# Patient Record
Sex: Male | Born: 1945 | Race: Black or African American | Hispanic: No | Marital: Married | Smoking: Current every day smoker
Health system: Southern US, Community
[De-identification: ages and names within clinical notes are randomized; demographics above are authoritative.]

## PROBLEM LIST (undated history)

## (undated) DIAGNOSIS — N186 End stage renal disease: Secondary | ICD-10-CM

## (undated) DIAGNOSIS — N400 Enlarged prostate without lower urinary tract symptoms: Secondary | ICD-10-CM

## (undated) DIAGNOSIS — I2510 Atherosclerotic heart disease of native coronary artery without angina pectoris: Secondary | ICD-10-CM

## (undated) DIAGNOSIS — I350 Nonrheumatic aortic (valve) stenosis: Secondary | ICD-10-CM

## (undated) DIAGNOSIS — F4310 Post-traumatic stress disorder, unspecified: Secondary | ICD-10-CM

## (undated) DIAGNOSIS — G4730 Sleep apnea, unspecified: Secondary | ICD-10-CM

## (undated) DIAGNOSIS — N210 Calculus in bladder: Secondary | ICD-10-CM

## (undated) DIAGNOSIS — E785 Hyperlipidemia, unspecified: Secondary | ICD-10-CM

## (undated) DIAGNOSIS — R7303 Prediabetes: Secondary | ICD-10-CM

## (undated) DIAGNOSIS — G629 Polyneuropathy, unspecified: Secondary | ICD-10-CM

## (undated) DIAGNOSIS — I10 Essential (primary) hypertension: Secondary | ICD-10-CM

## (undated) DIAGNOSIS — N183 Chronic kidney disease, stage 3 unspecified: Secondary | ICD-10-CM

## (undated) DIAGNOSIS — M1990 Unspecified osteoarthritis, unspecified site: Secondary | ICD-10-CM

## (undated) DIAGNOSIS — I739 Peripheral vascular disease, unspecified: Secondary | ICD-10-CM

## (undated) DIAGNOSIS — R011 Cardiac murmur, unspecified: Secondary | ICD-10-CM

## (undated) HISTORY — PX: TRANSTHORACIC ECHOCARDIOGRAM: SHX275

## (undated) HISTORY — PX: LUMBAR DISC SURGERY: SHX700

---

## 1967-05-06 HISTORY — PX: OTHER SURGICAL HISTORY: SHX169

## 2002-07-08 ENCOUNTER — Ambulatory Visit (HOSPITAL_COMMUNITY): Admission: RE | Admit: 2002-07-08 | Discharge: 2002-07-08 | Payer: Self-pay | Admitting: Orthopedic Surgery

## 2002-07-08 ENCOUNTER — Encounter: Payer: Self-pay | Admitting: Orthopedic Surgery

## 2010-02-08 ENCOUNTER — Ambulatory Visit (HOSPITAL_COMMUNITY)
Admission: RE | Admit: 2010-02-08 | Discharge: 2010-02-09 | Payer: Self-pay | Source: Home / Self Care | Admitting: Neurosurgery

## 2010-07-18 LAB — CBC
HCT: 33.1 % — ABNORMAL LOW (ref 39.0–52.0)
Hemoglobin: 11.1 g/dL — ABNORMAL LOW (ref 13.0–17.0)
MCH: 30.6 pg (ref 26.0–34.0)
MCHC: 33.5 g/dL (ref 30.0–36.0)
MCV: 91.2 fL (ref 78.0–100.0)
Platelets: 235 10*3/uL (ref 150–400)
RBC: 3.63 MIL/uL — ABNORMAL LOW (ref 4.22–5.81)
RDW: 12.1 % (ref 11.5–15.5)
WBC: 5.8 10*3/uL (ref 4.0–10.5)

## 2010-07-18 LAB — BASIC METABOLIC PANEL
BUN: 40 mg/dL — ABNORMAL HIGH (ref 6–23)
CO2: 25 mEq/L (ref 19–32)
Calcium: 9.7 mg/dL (ref 8.4–10.5)
Chloride: 105 mEq/L (ref 96–112)
Creatinine, Ser: 2.21 mg/dL — ABNORMAL HIGH (ref 0.4–1.5)
GFR calc Af Amer: 36 mL/min — ABNORMAL LOW (ref >60)
GFR calc non Af Amer: 30 mL/min — ABNORMAL LOW (ref >60)
Glucose, Bld: 122 mg/dL — ABNORMAL HIGH (ref 70–99)
Potassium: 4.3 mEq/L (ref 3.5–5.1)
Sodium: 139 mEq/L (ref 135–145)

## 2010-07-18 LAB — SURGICAL PCR SCREEN
MRSA, PCR: NEGATIVE
Staphylococcus aureus: NEGATIVE

## 2011-08-24 IMAGING — CR DG LUMBAR SPINE 2-3V
1 series · 1 of 1 positions shown · non-contrast
Comparison: None.

CLINICAL DATA: L4-5 decompression.

LUMBAR SPINE - 2-3 VIEW

[view not recorded]
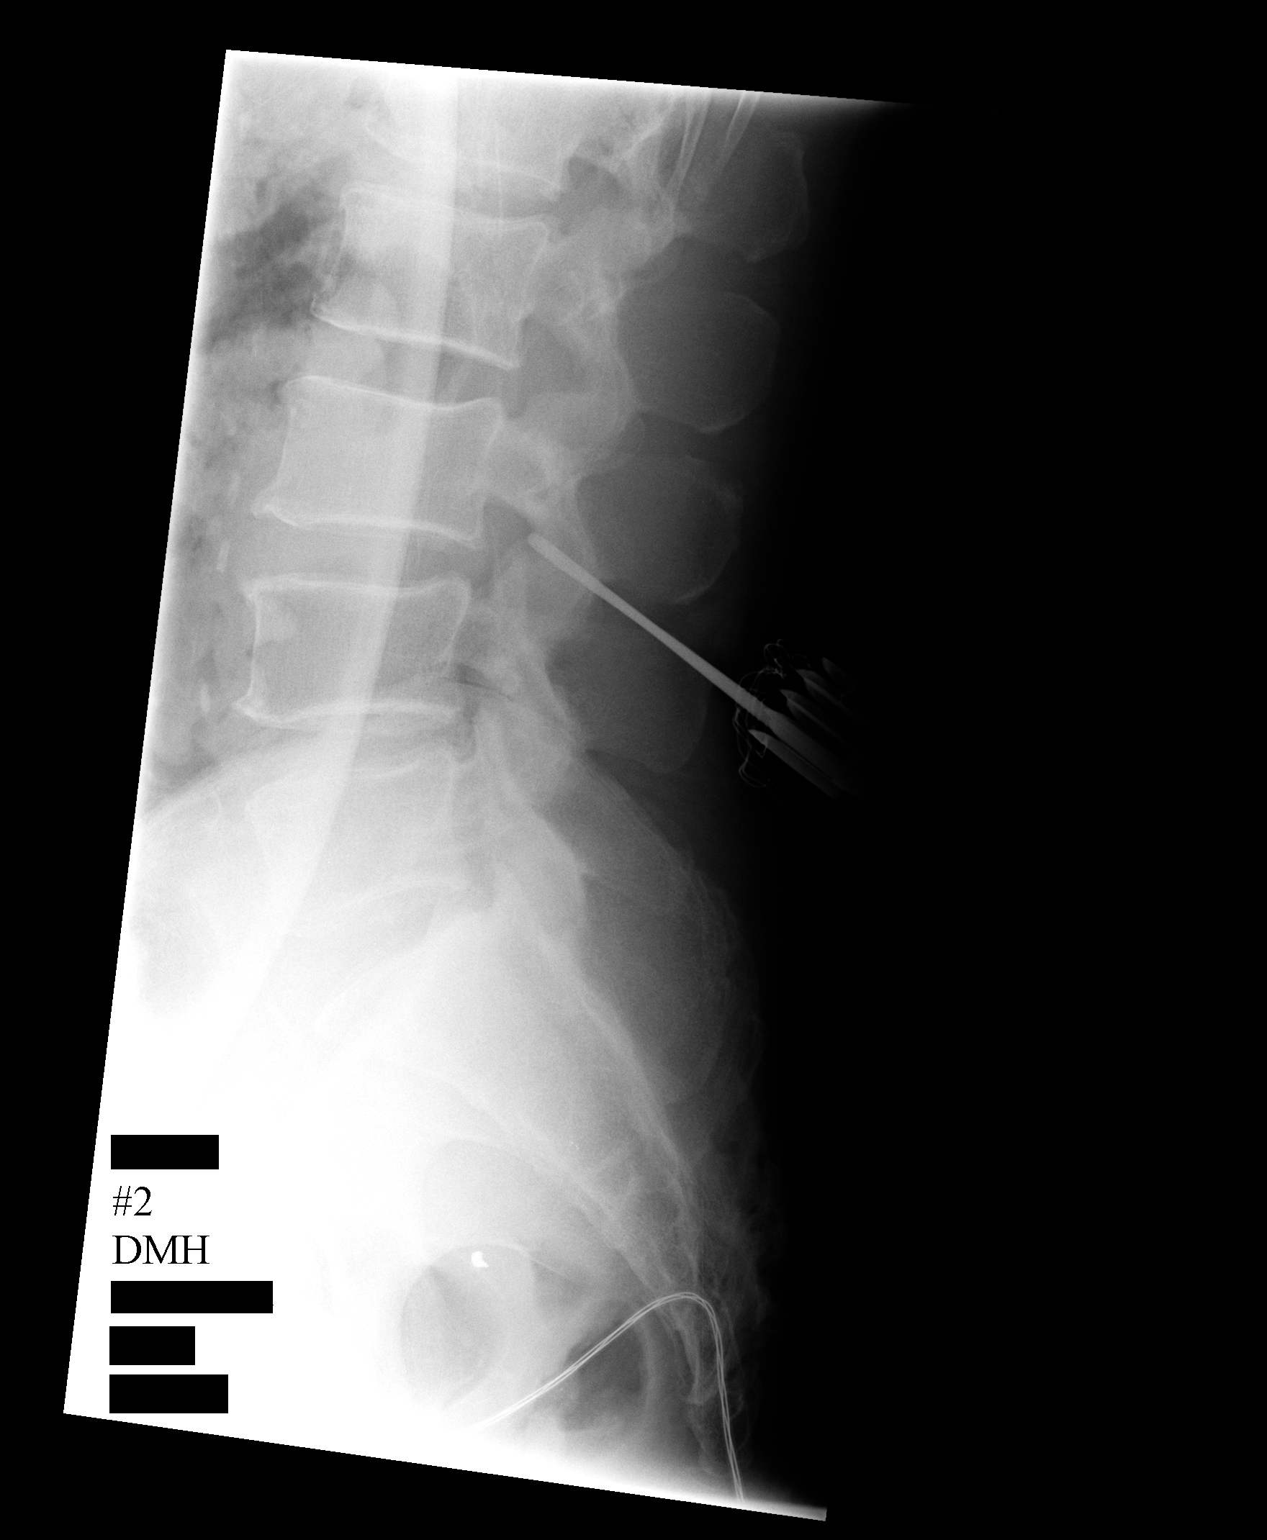

[1 of 1 positions shown; findings below may reference images not displayed]

FINDINGS: There are two intraoperative cross-table lateral views of
the lumbar spine.  The first film taken at 8228 hours shows a
surgical instrument tip projecting over the L4 spinous process.
There are endplate degenerative changes.

The second film taken at 4181 hours shows a surgical instrument tip
projecting along the posterior margin of the L3-4 neural foramina.
IMPRESSION: Intraoperative localization.

## 2015-05-02 NOTE — Progress Notes (Unsigned)
B/P: 164/83, Pulse: 89 per Mayra Reel RN

## 2015-08-19 DIAGNOSIS — N189 Chronic kidney disease, unspecified: Secondary | ICD-10-CM | POA: Diagnosis not present

## 2015-08-19 DIAGNOSIS — I129 Hypertensive chronic kidney disease with stage 1 through stage 4 chronic kidney disease, or unspecified chronic kidney disease: Secondary | ICD-10-CM | POA: Diagnosis not present

## 2015-08-19 DIAGNOSIS — Z79899 Other long term (current) drug therapy: Secondary | ICD-10-CM | POA: Diagnosis not present

## 2015-08-19 DIAGNOSIS — J449 Chronic obstructive pulmonary disease, unspecified: Secondary | ICD-10-CM | POA: Diagnosis not present

## 2015-08-19 DIAGNOSIS — Z7982 Long term (current) use of aspirin: Secondary | ICD-10-CM | POA: Diagnosis not present

## 2015-08-19 DIAGNOSIS — R2242 Localized swelling, mass and lump, left lower limb: Secondary | ICD-10-CM | POA: Diagnosis not present

## 2015-08-19 DIAGNOSIS — Z801 Family history of malignant neoplasm of trachea, bronchus and lung: Secondary | ICD-10-CM | POA: Diagnosis not present

## 2015-08-19 DIAGNOSIS — E7800 Pure hypercholesterolemia, unspecified: Secondary | ICD-10-CM | POA: Diagnosis not present

## 2015-08-21 DIAGNOSIS — G629 Polyneuropathy, unspecified: Secondary | ICD-10-CM | POA: Diagnosis not present

## 2015-08-21 DIAGNOSIS — J449 Chronic obstructive pulmonary disease, unspecified: Secondary | ICD-10-CM | POA: Diagnosis not present

## 2015-08-21 DIAGNOSIS — I129 Hypertensive chronic kidney disease with stage 1 through stage 4 chronic kidney disease, or unspecified chronic kidney disease: Secondary | ICD-10-CM | POA: Diagnosis not present

## 2015-08-21 DIAGNOSIS — E7800 Pure hypercholesterolemia, unspecified: Secondary | ICD-10-CM | POA: Diagnosis not present

## 2015-08-21 DIAGNOSIS — N189 Chronic kidney disease, unspecified: Secondary | ICD-10-CM | POA: Diagnosis not present

## 2015-08-21 DIAGNOSIS — Z79899 Other long term (current) drug therapy: Secondary | ICD-10-CM | POA: Diagnosis not present

## 2015-08-21 DIAGNOSIS — M109 Gout, unspecified: Secondary | ICD-10-CM | POA: Diagnosis not present

## 2015-08-21 DIAGNOSIS — L97529 Non-pressure chronic ulcer of other part of left foot with unspecified severity: Secondary | ICD-10-CM | POA: Diagnosis not present

## 2015-08-21 DIAGNOSIS — Z7982 Long term (current) use of aspirin: Secondary | ICD-10-CM | POA: Diagnosis not present

## 2015-09-06 DIAGNOSIS — E7800 Pure hypercholesterolemia, unspecified: Secondary | ICD-10-CM | POA: Diagnosis not present

## 2015-09-06 DIAGNOSIS — M109 Gout, unspecified: Secondary | ICD-10-CM | POA: Diagnosis not present

## 2015-09-06 DIAGNOSIS — Z79899 Other long term (current) drug therapy: Secondary | ICD-10-CM | POA: Diagnosis not present

## 2015-09-06 DIAGNOSIS — M19072 Primary osteoarthritis, left ankle and foot: Secondary | ICD-10-CM | POA: Diagnosis not present

## 2015-09-06 DIAGNOSIS — Z7982 Long term (current) use of aspirin: Secondary | ICD-10-CM | POA: Diagnosis not present

## 2015-09-06 DIAGNOSIS — I129 Hypertensive chronic kidney disease with stage 1 through stage 4 chronic kidney disease, or unspecified chronic kidney disease: Secondary | ICD-10-CM | POA: Diagnosis not present

## 2015-09-06 DIAGNOSIS — N189 Chronic kidney disease, unspecified: Secondary | ICD-10-CM | POA: Diagnosis not present

## 2015-09-06 DIAGNOSIS — J449 Chronic obstructive pulmonary disease, unspecified: Secondary | ICD-10-CM | POA: Diagnosis not present

## 2015-09-06 DIAGNOSIS — Z87820 Personal history of traumatic brain injury: Secondary | ICD-10-CM | POA: Diagnosis not present

## 2015-09-06 DIAGNOSIS — G629 Polyneuropathy, unspecified: Secondary | ICD-10-CM | POA: Diagnosis not present

## 2015-10-07 DIAGNOSIS — M5432 Sciatica, left side: Secondary | ICD-10-CM | POA: Diagnosis not present

## 2015-10-23 DIAGNOSIS — G8929 Other chronic pain: Secondary | ICD-10-CM | POA: Diagnosis not present

## 2015-10-23 DIAGNOSIS — I519 Heart disease, unspecified: Secondary | ICD-10-CM | POA: Diagnosis not present

## 2015-10-23 DIAGNOSIS — Z7982 Long term (current) use of aspirin: Secondary | ICD-10-CM | POA: Diagnosis not present

## 2015-10-23 DIAGNOSIS — I129 Hypertensive chronic kidney disease with stage 1 through stage 4 chronic kidney disease, or unspecified chronic kidney disease: Secondary | ICD-10-CM | POA: Diagnosis not present

## 2015-10-23 DIAGNOSIS — J449 Chronic obstructive pulmonary disease, unspecified: Secondary | ICD-10-CM | POA: Diagnosis not present

## 2015-10-23 DIAGNOSIS — Z87820 Personal history of traumatic brain injury: Secondary | ICD-10-CM | POA: Diagnosis not present

## 2015-10-23 DIAGNOSIS — M79605 Pain in left leg: Secondary | ICD-10-CM | POA: Diagnosis not present

## 2015-10-23 DIAGNOSIS — Z79899 Other long term (current) drug therapy: Secondary | ICD-10-CM | POA: Diagnosis not present

## 2015-10-23 DIAGNOSIS — G629 Polyneuropathy, unspecified: Secondary | ICD-10-CM | POA: Diagnosis not present

## 2015-10-23 DIAGNOSIS — E7800 Pure hypercholesterolemia, unspecified: Secondary | ICD-10-CM | POA: Diagnosis not present

## 2015-10-23 DIAGNOSIS — N189 Chronic kidney disease, unspecified: Secondary | ICD-10-CM | POA: Diagnosis not present

## 2015-11-15 ENCOUNTER — Telehealth (HOSPITAL_COMMUNITY): Payer: Self-pay | Admitting: Vascular Surgery

## 2015-11-15 NOTE — Telephone Encounter (Signed)
11/15/2015 350pm received a request for a consult from the [facility] for left great toe ulcer w/ assoc. Osteomyelitis.  The next available appointment with any physician in the practice is August 3rd.  This was offered to Matthew [name].  She declined.  I offered to put Matthew [name] on a wait list.   She declined.   I explained that she would need to call the [place] to see if they wanted to refer him to a different practice or they could go to the [facility] ER if they felt like it was an emergency.   She declined calling the [place] stating that it would take to long.    She said they would go to the ER on 11/16/15.

## 2015-11-16 ENCOUNTER — Encounter (HOSPITAL_COMMUNITY): Payer: Self-pay | Admitting: Emergency Medicine

## 2015-11-16 ENCOUNTER — Emergency Department (HOSPITAL_COMMUNITY): Payer: Medicare Other

## 2015-11-16 ENCOUNTER — Inpatient Hospital Stay (HOSPITAL_COMMUNITY)
Admission: EM | Admit: 2015-11-16 | Discharge: 2015-11-17 | DRG: 638 | Disposition: A | Discharge status: Home / Self Care | Payer: Medicare Other | Attending: Family Medicine | Admitting: Family Medicine

## 2015-11-16 DIAGNOSIS — E119 Type 2 diabetes mellitus without complications: Secondary | ICD-10-CM

## 2015-11-16 DIAGNOSIS — E1140 Type 2 diabetes mellitus with diabetic neuropathy, unspecified: Secondary | ICD-10-CM | POA: Diagnosis present

## 2015-11-16 DIAGNOSIS — Z833 Family history of diabetes mellitus: Secondary | ICD-10-CM

## 2015-11-16 DIAGNOSIS — N183 Chronic kidney disease, stage 3 unspecified: Secondary | ICD-10-CM

## 2015-11-16 DIAGNOSIS — L97529 Non-pressure chronic ulcer of other part of left foot with unspecified severity: Secondary | ICD-10-CM | POA: Diagnosis not present

## 2015-11-16 DIAGNOSIS — I129 Hypertensive chronic kidney disease with stage 1 through stage 4 chronic kidney disease, or unspecified chronic kidney disease: Secondary | ICD-10-CM | POA: Diagnosis not present

## 2015-11-16 DIAGNOSIS — E1122 Type 2 diabetes mellitus with diabetic chronic kidney disease: Secondary | ICD-10-CM | POA: Diagnosis present

## 2015-11-16 DIAGNOSIS — M869 Osteomyelitis, unspecified: Secondary | ICD-10-CM | POA: Diagnosis not present

## 2015-11-16 DIAGNOSIS — F17210 Nicotine dependence, cigarettes, uncomplicated: Secondary | ICD-10-CM | POA: Diagnosis present

## 2015-11-16 DIAGNOSIS — E1169 Type 2 diabetes mellitus with other specified complication: Secondary | ICD-10-CM | POA: Diagnosis not present

## 2015-11-16 DIAGNOSIS — M868X7 Other osteomyelitis, ankle and foot: Secondary | ICD-10-CM | POA: Diagnosis not present

## 2015-11-16 DIAGNOSIS — I10 Essential (primary) hypertension: Secondary | ICD-10-CM

## 2015-11-16 DIAGNOSIS — M86172 Other acute osteomyelitis, left ankle and foot: Secondary | ICD-10-CM | POA: Diagnosis present

## 2015-11-16 DIAGNOSIS — L089 Local infection of the skin and subcutaneous tissue, unspecified: Secondary | ICD-10-CM | POA: Diagnosis present

## 2015-11-16 DIAGNOSIS — E785 Hyperlipidemia, unspecified: Secondary | ICD-10-CM | POA: Diagnosis not present

## 2015-11-16 DIAGNOSIS — M86672 Other chronic osteomyelitis, left ankle and foot: Secondary | ICD-10-CM | POA: Diagnosis not present

## 2015-11-16 DIAGNOSIS — M79675 Pain in left toe(s): Secondary | ICD-10-CM | POA: Diagnosis not present

## 2015-11-16 HISTORY — DX: Hyperlipidemia, unspecified: E78.5

## 2015-11-16 HISTORY — DX: Essential (primary) hypertension: I10

## 2015-11-16 LAB — CBC WITH DIFFERENTIAL/PLATELET
Basophils Absolute: 0.1 10*3/uL (ref 0.0–0.1)
Basophils Relative: 1 %
EOS PCT: 4 %
Eosinophils Absolute: 0.3 10*3/uL (ref 0.0–0.7)
HEMATOCRIT: 32.3 % — AB (ref 39.0–52.0)
Hemoglobin: 11 g/dL — ABNORMAL LOW (ref 13.0–17.0)
LYMPHS ABS: 2.1 10*3/uL (ref 0.7–4.0)
Lymphocytes Relative: 27 %
MCH: 29.7 pg (ref 26.0–34.0)
MCHC: 34.1 g/dL (ref 30.0–36.0)
MCV: 87.3 fL (ref 78.0–100.0)
MONO ABS: 0.7 10*3/uL (ref 0.1–1.0)
MONOS PCT: 9 %
NEUTROS PCT: 59 %
Neutro Abs: 4.7 10*3/uL (ref 1.7–7.7)
PLATELETS: 285 10*3/uL (ref 150–400)
RBC: 3.7 MIL/uL — ABNORMAL LOW (ref 4.22–5.81)
RDW: 13.6 % (ref 11.5–15.5)
WBC: 7.9 10*3/uL (ref 4.0–10.5)

## 2015-11-16 LAB — SEDIMENTATION RATE: Sed Rate: 60 mm/hr — ABNORMAL HIGH (ref 0–16)

## 2015-11-16 LAB — BASIC METABOLIC PANEL
Anion gap: 9 (ref 5–15)
BUN: 12 mg/dL (ref 6–20)
CHLORIDE: 109 mmol/L (ref 101–111)
CO2: 22 mmol/L (ref 22–32)
Calcium: 9.2 mg/dL (ref 8.9–10.3)
Creatinine, Ser: 1.74 mg/dL — ABNORMAL HIGH (ref 0.61–1.24)
GFR calc Af Amer: 44 mL/min — ABNORMAL LOW (ref >60)
GFR, EST NON AFRICAN AMERICAN: 38 mL/min — AB (ref >60)
GLUCOSE: 65 mg/dL (ref 65–99)
POTASSIUM: 3.7 mmol/L (ref 3.5–5.1)
Sodium: 140 mmol/L (ref 135–145)

## 2015-11-16 LAB — CBG MONITORING, ED: Glucose-Capillary: 156 mg/dL — ABNORMAL HIGH (ref 65–99)

## 2015-11-16 LAB — C-REACTIVE PROTEIN: CRP: <0.5 mg/dL (ref <1.0)

## 2015-11-16 LAB — GLUCOSE, CAPILLARY: GLUCOSE-CAPILLARY: 143 mg/dL — AB (ref 65–99)

## 2015-11-16 MED ORDER — ONDANSETRON HCL 4 MG PO TABS: 4.0000 mg | ORAL_TABLET | Freq: Four times a day (QID) | ORAL | Status: DC | PRN

## 2015-11-16 MED ORDER — TRAZODONE HCL 50 MG PO TABS
25.0000 mg | ORAL_TABLET | Freq: Every evening | ORAL | Status: DC | PRN
  Administered 2015-11-16: 25 mg via ORAL
  Filled 2015-11-16: qty 1

## 2015-11-16 MED ORDER — HYDROCODONE-ACETAMINOPHEN 5-325 MG PO TABS
1.0000 | ORAL_TABLET | ORAL | Status: DC | PRN
  Administered 2015-11-16 – 2015-11-17 (×5): 2 via ORAL
  Filled 2015-11-16 (×4): qty 2

## 2015-11-16 MED ORDER — ONDANSETRON HCL 4 MG/2ML IJ SOLN: 4.0000 mg | Freq: Four times a day (QID) | INTRAMUSCULAR | Status: DC | PRN

## 2015-11-16 MED ORDER — HYDROMORPHONE HCL 1 MG/ML IJ SOLN: 1.0000 mg | INTRAMUSCULAR | Status: DC | PRN

## 2015-11-16 MED ORDER — HEPARIN SODIUM (PORCINE) 5000 UNIT/ML IJ SOLN
5000.0000 [IU] | Freq: Three times a day (TID) | INTRAMUSCULAR | Status: DC
  Administered 2015-11-16 – 2015-11-17 (×2): 5000 [IU] via SUBCUTANEOUS
  Filled 2015-11-16: qty 1

## 2015-11-16 MED ORDER — VANCOMYCIN HCL IN DEXTROSE 1-5 GM/200ML-% IV SOLN
1000.0000 mg | Freq: Once | INTRAVENOUS | Status: AC
  Administered 2015-11-16: 1000 mg via INTRAVENOUS
  Filled 2015-11-16: qty 200

## 2015-11-16 MED ORDER — VANCOMYCIN HCL IN DEXTROSE 750-5 MG/150ML-% IV SOLN
750.0000 mg | Freq: Two times a day (BID) | INTRAVENOUS | Status: DC
  Administered 2015-11-17: 750 mg via INTRAVENOUS
  Filled 2015-11-16 (×3): qty 150

## 2015-11-16 MED ORDER — ATORVASTATIN CALCIUM 20 MG PO TABS
20.0000 mg | ORAL_TABLET | Freq: Every day | ORAL | Status: DC
  Administered 2015-11-16: 20 mg via ORAL
  Filled 2015-11-16: qty 1

## 2015-11-16 MED ORDER — METHOCARBAMOL 750 MG PO TABS
750.0000 mg | ORAL_TABLET | Freq: Every day | ORAL | Status: DC
  Administered 2015-11-16: 750 mg via ORAL
  Filled 2015-11-16: qty 1

## 2015-11-16 MED ORDER — POLYETHYLENE GLYCOL 3350 17 G PO PACK: 17.0000 g | PACK | Freq: Every day | ORAL | Status: DC | PRN

## 2015-11-16 MED ORDER — ACETAMINOPHEN 325 MG PO TABS: 650.0000 mg | ORAL_TABLET | Freq: Four times a day (QID) | ORAL | Status: DC | PRN

## 2015-11-16 MED ORDER — FERROUS SULFATE 325 (65 FE) MG PO TABS
325.0000 mg | ORAL_TABLET | Freq: Two times a day (BID) | ORAL | Status: DC
  Administered 2015-11-17: 325 mg via ORAL
  Filled 2015-11-16: qty 1

## 2015-11-16 MED ORDER — GABAPENTIN 600 MG PO TABS
600.0000 mg | ORAL_TABLET | Freq: Two times a day (BID) | ORAL | Status: DC
  Administered 2015-11-16 – 2015-11-17 (×2): 600 mg via ORAL
  Filled 2015-11-16 (×3): qty 1

## 2015-11-16 MED ORDER — PIPERACILLIN-TAZOBACTAM 3.375 G IVPB
3.3750 g | Freq: Three times a day (TID) | INTRAVENOUS | Status: DC
  Administered 2015-11-17: 3.375 g via INTRAVENOUS
  Filled 2015-11-16 (×4): qty 50

## 2015-11-16 MED ORDER — SODIUM CHLORIDE 0.9 % IV SOLN: INTRAVENOUS | Status: DC

## 2015-11-16 MED ORDER — AMLODIPINE BESYLATE 10 MG PO TABS
10.0000 mg | ORAL_TABLET | Freq: Every day | ORAL | Status: DC
  Administered 2015-11-17: 10 mg via ORAL
  Filled 2015-11-16: qty 1

## 2015-11-16 MED ORDER — PIPERACILLIN-TAZOBACTAM 3.375 G IVPB 30 MIN
3.3750 g | Freq: Once | INTRAVENOUS | Status: AC
  Administered 2015-11-16: 3.375 g via INTRAVENOUS
  Filled 2015-11-16: qty 50

## 2015-11-16 MED ORDER — INSULIN ASPART 100 UNIT/ML ~~LOC~~ SOLN
0.0000 [IU] | Freq: Three times a day (TID) | SUBCUTANEOUS | Status: DC
  Filled 2015-11-16: qty 1

## 2015-11-16 MED ORDER — ACETAMINOPHEN 650 MG RE SUPP: 650.0000 mg | Freq: Four times a day (QID) | RECTAL | Status: DC | PRN

## 2015-11-16 MED ORDER — HYDRALAZINE HCL 20 MG/ML IJ SOLN: 10.0000 mg | Freq: Four times a day (QID) | INTRAMUSCULAR | Status: DC | PRN

## 2015-11-16 MED ORDER — BISACODYL 5 MG PO TBEC: 5.0000 mg | DELAYED_RELEASE_TABLET | Freq: Every day | ORAL | Status: DC | PRN

## 2015-11-16 MED ORDER — LATANOPROST 0.005 % OP SOLN
1.0000 [drp] | Freq: Every day | OPHTHALMIC | Status: DC
  Administered 2015-11-16: 1 [drp] via OPHTHALMIC
  Filled 2015-11-16 (×2): qty 2.5

## 2015-11-16 NOTE — ED Provider Notes (Signed)
CSN: [id number]     Arrival date & time 11/16/15  0957 History   First MD Initiated Contact with Patient 11/16/15 1004     Chief Complaint  Patient presents with  . Circulatory Problem     (Consider location/radiation/quality/duration/timing/severity/associated sxs/prior Treatment) The history is provided by the patient.    Matthew Matthew [name] is a 70 yo Matthew [name] w/ hx of DM, neuropathy who was recently [facility]) sent here for evaluation by surgery for toe amputation and poor lower extremity circulation.  The patient states that Matthew Matthew [name] has been evaluated by the [facility] for this complaint, and was referred to vascular for possible amputation.  Matthew Matthew [name] was unable to secure an appointment before August 3rd, but it was determined that this was not soon enough and Matthew Matthew [name] was advised to come here for further vascular care.  Matthew Matthew [name] endorses pain in the L toe that is consistent with the pain Matthew Matthew [name] has been experiencing, and denies fevers, chills, CP, or pain in any other part of his LE. The current toe became red and swollen several weeks ago, however it has gotten worse over the past couple of weeks.    ROS 10 Systems reviewed and are negative for acute change except as noted in the HPI.     Past Medical History  Diagnosis Date  . Diabetes mellitus without complication ([place])   . Hypertension   . High cholesterol    Past Surgical History  Procedure Laterality Date  . Back surgery     No family history on file. Social History  Substance Use Topics  . Smoking status: Current Every Day Smoker -- 1.00 packs/day    Types: Cigarettes  . Smokeless tobacco: Not on file  . Alcohol Use: No    Review of Systems  Constitutional: Negative for activity change and appetite change.  Respiratory: Negative for cough and shortness of breath.   Cardiovascular: Negative for chest pain.  Gastrointestinal: Negative for abdominal pain.  Genitourinary: Negative for  dysuria.  Musculoskeletal: Positive for arthralgias.  Skin: Positive for rash and wound.  Allergic/Immunologic: Positive for immunocompromised state.      Allergies  Review of patient's allergies indicates no known allergies.  Home Medications   Prior to Admission medications   Not on File   BP 147/98 mmHg  Pulse 80  Temp(Src) 97.9 F (36.6 C) (Oral)  Resp 13  SpO2 97% Physical Exam  Constitutional: Matthew Matthew [name] is oriented to person, place, and time. Matthew Matthew [name] appears well-developed.  HENT:  Head: Atraumatic.  Neck: Neck supple.  Cardiovascular: Normal rate.   Pulmonary/Chest: Effort normal.  Musculoskeletal:  L great toe is edematous, erythematous. There is dopplerable pulse Skin is warm  Neurological: Matthew Matthew [name] is alert and oriented to person, place, and time.  Skin: Skin is warm. Rash noted.  Nursing note and vitals reviewed.   ED Course  Procedures (including critical care time) Labs Review Labs Reviewed  CBC WITH DIFFERENTIAL/PLATELET - Abnormal; Notable for the following:    RBC 3.70 (*)    Hemoglobin 11.0 (*)    HCT 32.3 (*)    All other components within normal limits  BASIC METABOLIC PANEL - Abnormal; Notable for the following:    Creatinine, Ser 1.74 (*)    GFR calc non Af Amer 38 (*)    GFR calc Af Amer 44 (*)    All other components within normal limits  SEDIMENTATION RATE - Abnormal; Notable for the following:  Sed Rate 60 (*)    All other components within normal limits  C-REACTIVE PROTEIN  HEMOGLOBIN A1C    Imaging Review Dg Toe Great Left  11/16/2015  CLINICAL DATA:  Left great toe pain for 6 months. EXAM: LEFT GREAT TOE COMPARISON:  Radiographs of Sep 06, 2015. FINDINGS: There is interval development of lytic destruction involving the distal portion of the first proximal phalanx and proximal base of the first distal phalanx. This is consistent with acute osteomyelitis. Vascular calcifications are noted. No radiopaque foreign body is noted. IMPRESSION: Findings  consistent with acute osteomyelitis involving the first proximal and distal phalanges. Electronically Signed   By: Marijo Conception, Matthew [name].D.   On: 11/16/2015 12:19   I have personally reviewed and evaluated these images and lab results as part of my medical decision-making.   EKG Interpretation None      MDM   Final diagnoses:  Acute osteomyelitis of toe, left (HCC)   PT comes in with cc of toe swelling. Xrays confirm osteomyelitis. Pt has no systemic disease at this time and is clinically stable.  Orthopedics consulted - and Dr. Percell Miller will evaluate the patient. Medicine to admit.  Pt might have some claudication type symptoms - no critical limb  Ischemia currently. Defer care to admitting team on that regards.    Varney Biles, MD 11/16/15 [phone]

## 2015-11-16 NOTE — Progress Notes (Signed)
Pharmacy Antibiotic Note Matthew [name] is a 70 y.o. male admitted on 11/16/2015 with osteomyelitis of L great toe.  Pharmacy has been consulted for Zosyn and vancomycin dosing.  Plan: Vancomycin 750 IV every 12 hours.  Goal trough 15-20 mcg/mL. Zosyn 3.375g IV q8h (4 hour infusion).  Weight: 182 lb (82.555 kg)  Temp (24hrs), Avg:97.9 F (36.6 C), Min:97.7 F (36.5 C), Max:98.2 F (36.8 C)   Recent Labs Lab 11/16/15 1135  WBC 7.9  CREATININE 1.74*    CrCl cannot be calculated (Unknown ideal weight.).    No Known Allergies  Antimicrobials this admission: 7/14 Zosyn >>  7 /14 vancomycin  >>   Dose adjustments this admission: n/a  Microbiology results: None to date  Thank you for allowing pharmacy to be a part of this patient's care.  Vincenza Hews, PharmD, BCPS 11/16/2015, 7:56 PM Pager: [phone]

## 2015-11-16 NOTE — H&P (Signed)
History and Physical    Matthew [name] [id number] DOB: 1945/07/10 DOA: 11/16/2015  PCP: No primary care provider on file.Followed by the [facility]  Patient coming from:   Home    Chief Complaint: Left great toe infection  HPI: Matthew [name] is a 70 y.o. male with medical history significant for but not limited to, HTN, tobacco abuse, and DM2. Six months ago patient developed pain and redness in his left great toe. This eventually ulcerated. Patient has been under care of [facility]. Last month he was started on twice daily amoxicillin. Wife and daughter state his toe actually looks much better. Patient was having significant pain in his toe requiring frequent emergency department visits but even his pain has improved. An xray at [facility] showed bone destruction. PCP tried to get the patient to a vascular surgeon but no available appointments until late August so he was advised to come to ED.   ED Course:  Afebrile, normotensive. Normal oxygen saturation Creatinine 1.74, WBC 7.9, hemoglobin 11, sedimentation rate 68 Left great toe x-ray consistent with acute osteomyelitis  Review of Systems:  Chronic constipation . As per HPI, otherwise 10 point review of systems negative.    Past Medical History  Diagnosis Date  . Diabetes mellitus without complication ([place])   . Hypertension   . High cholesterol     Past Surgical History  Procedure Laterality Date  . Back surgery      Social History   Social History  . Marital Status: Married    Spouse Name: N/A  . Number of Children: N/A  . Years of Education: N/A   Occupational History  . Not on file.   Social History Main Topics  . Smoking status: Current Every Day Smoker -- 1.00 packs/day    Types: Cigarettes  . Smokeless tobacco: Not on file  . Alcohol Use: No  . Drug Use: No  . Sexual Activity: Not on file   Other Topics Concern  . Not on file   Social History Narrative  . No narrative on file  Lives with wife at home.  No assistive devices needed for ambulation  No Known Allergies  Family History  Problem Relation Age of Onset  . Leukemia Sister   . Lung cancer Brother   . Heart failure Mother   . Diabetes Father     Prior to Admission medications   Not on File    Physical Exam: Filed Vitals:   11/16/15 1002 11/16/15 1017 11/16/15 1100  BP: 145/102 113/88 120/84  Pulse: 102 88 83  Temp: 97.7 F (36.5 C) 97.9 F (36.6 C)   TempSrc: Oral Oral   Resp: 18 21 20   SpO2: 98% 97% 97%    Constitutional:  Pleasant well developed black male in NAD, calm, comfortable Filed Vitals:   11/16/15 1002 11/16/15 1017 11/16/15 1100  BP: 145/102 113/88 120/84  Pulse: 102 88 83  Temp: 97.7 F (36.5 C) 97.9 F (36.6 C)   TempSrc: Oral Oral   Resp: 18 21 20   SpO2: 98% 97% 97%   Eyes: PER, lids and conjunctivae normal ENMT: Mucous membranes are moist. Posterior pharynx clear of any exudate or lesions..  Neck: normal, supple, no masses Respiratory: clear to auscultation bilaterally, no wheezing, no crackles. Normal respiratory effort. No accessory muscle use.  Cardiovascular: Regular rate and rhythm, loud murmur, no murmurs. No extremity edema. 1+ pedal pulses.   Abdomen: no tenderness, no masses palpated. No hepatomegaly. Bowel sounds positive.  Musculoskeletal: no clubbing / cyanosis. No joint deformity upper and lower extremities. Good ROM, no contractures. Normal muscle tone.  Skin: no rashes, warm Left foot.  Mild erythema, swelling left great toe. Small healing ulceration on side of left great toe, lateral to nail. No drainage  Neurologic: CN 2-12 grossly intact. Sensation intact, Strength 5/5 in all 4.  Psychiatric: Normal judgment and insight. Alert and oriented x 3. Normal mood.    Labs on Admission: I have personally reviewed following labs and imaging studies  Radiological Exams on Admission: Dg Toe Great Left  11/16/2015  CLINICAL DATA:  Left great toe pain for 6 months. EXAM: LEFT GREAT  TOE COMPARISON:  Radiographs of Sep 06, 2015. FINDINGS: There is interval development of lytic destruction involving the distal portion of the first proximal phalanx and proximal base of the first distal phalanx. This is consistent with acute osteomyelitis. Vascular calcifications are noted. No radiopaque foreign body is noted. IMPRESSION: Findings consistent with acute osteomyelitis involving the first proximal and distal phalanges. Electronically Signed   By: Marijo Conception, M.D.   On: 11/16/2015 12:19    Assessment/Plan   Active Problems:   Osteomyelitis (HCC)   HTN (hypertension)   Diabetes mellitus, type 2 (HCC)   CKD (chronic kidney disease), stage III   Hyperlipidemia   Osteomyelitis left great toe. On BID amoxicillin at home for the last month -admit to Medical bed.    -EDP has consulted Orthopedics -Will hold off on antibiotics until seen by Ortho  DM2, level of control unknown. -A1c -CBGs, SSI  CKD, stage 3 based on today's labs. [facility] . -Minimize use of nephrotoxic medications -AM bmet  HTN. Elevated in ED at 147/98.  . -Continue home meds -prn hydralazine                                   DVT prophylaxis:     SQ Heparin Code Status:   Full code  Family Communication:    Treatment plan discussed with wife and daughter in the room, they both understand and agree with plans  Disposition Plan:  Discharge home 3-4 days             Consults called:    Orthopedics  Admission status:  Admit to Medical bed  Tye Savoy NP [facility] Pager [phone]  If 7PM-7AM, please contact night-coverage [email or web address] Password [facility]  11/16/2015, 1:27 PM    Attestation: Patient was seen, examined,treatment plan was discussed with the Advance Practice Provider.  I have personally reviewed the clinical findings, labs, EKG, imaging studies and management of this patient in detail. I have also reviewed the orders written for this patient which were  under my direction. I agree with the documentation, as recorded by the Advance Practice Provider.   Matthew [name] is a 70 y.o. male with a Past Medical History of DM, HTN who presents with osteomyelitis of great toe.  Toe is quite wiggly and appears to not be fully articulated due to chronic bony infection.  Skin wound actually appears fairly clean.  See by orthopedics and they are fine with outpatient f/u with patient.  Suggest PICC line placement for long-term antibiotics tomorrow and likely discharge in next 24-48 hours for completion of 6 weeks of antibiotics.    Karmen Bongo, MD

## 2015-11-16 NOTE — Consult Note (Signed)
ORTHOPAEDIC CONSULTATION  REQUESTING PHYSICIAN: Reyne Dumas, MD  Chief Complaint: left great toe pain and redness.  Assessment: Active Problems:   Osteomyelitis (HCC)   HTN (hypertension)   Diabetes mellitus, type 2 (HCC)   CKD (chronic kidney disease), stage III   Hyperlipidemia  AFVSN. Left great toe stable/improving with by mouth antibiotics over the past month.  Plan: Continue antibiotic therapy and discharge when stable. Follow-up with Dr. Sharol Given to in his clinic next week.   The patient was seen and examined by Dr. Alain Marion who also discussed the plan for treatment and follow-up. He verbalizes understanding and agrees with this plan.  Weight Bearing Status: WBAT   HPI: Matthew [name] is a 70 y.o. male who complains of  Left great toe pain and redness for 6 months.  He has been in the care of the [place].Started on Augmentin 1 month ago with reported improvement. X-ray at the [facility] showed bone destruction.  PCP attempted referral to vascular but none available until August. He denies fever or chills. No chest pain, shortness of breath. He has been compliant with antibiotic therapy and has been taking pain medicine.  Orthopedics was consulted for evaluation.    Past Medical History  Diagnosis Date  . Diabetes mellitus without complication ([place])   . Hypertension   . High cholesterol   . Hyperlipidemia    Past Surgical History  Procedure Laterality Date  . Back surgery     Social History   Social History  . Marital Status: Married    Spouse Name: N/A  . Number of Children: N/A  . Years of Education: N/A   Social History Main Topics  . Smoking status: Current Every Day Smoker -- 1.00 packs/day    Types: Cigarettes  . Smokeless tobacco: Not on file  . Alcohol Use: No  . Drug Use: No  . Sexual Activity: Not on file   Other Topics Concern  . Not on file   Social History Narrative  . No narrative on file   Family History  Problem Relation Age of Onset  .  Leukemia Sister   . Lung cancer Brother   . Heart failure Mother   . Diabetes Father    No Known Allergies Prior to Admission medications   Medication Sig Start Date End Date Taking? Authorizing Provider  amLODipine (NORVASC) 10 MG tablet Take 10 mg by mouth daily. For heart   Yes Historical Provider, MD  amoxicillin-clavulanate (AUGMENTIN) 500-125 MG tablet Take 1 tablet by mouth 2 (two) times daily. 14 day course started 11/09/15   Yes Historical Provider, MD  atorvastatin (LIPITOR) 20 MG tablet Take 20 mg by mouth at bedtime. For cholesterol   Yes Historical Provider, MD  ferrous sulfate 325 (65 FE) MG tablet Take 325 mg by mouth 2 (two) times daily with a meal.   Yes Historical Provider, MD  gabapentin (NEURONTIN) 600 MG tablet Take 600 mg by mouth 2 (two) times daily. For peripheral neuropathy   Yes Historical Provider, MD  ibuprofen (ADVIL,MOTRIN) 200 MG tablet Take 400 mg by mouth every 6 (six) hours as needed (pain).   Yes Historical Provider, MD  latanoprost (XALATAN) 0.005 % ophthalmic solution Place 1 drop into both eyes at bedtime.   Yes Historical Provider, MD  methocarbamol (ROBAXIN) 750 MG tablet Take 750 mg by mouth at bedtime.   Yes Historical Provider, MD  Omega-3 Fatty Acids (FISH OIL) 1000 MG CAPS Take 1,000 mg by mouth 2 (two)  times daily.   Yes Historical Provider, MD  oxyCODONE-acetaminophen (PERCOCET) 10-325 MG tablet Take 1 tablet by mouth 3 (three) times daily. scheduled   Yes Historical Provider, MD  polymixin-bacitracin (POLYSPORIN) 500-10000 UNIT/GM OINT ointment Apply 1 application topically 2 (two) times daily. Apply to big toe left foot   Yes Historical Provider, MD   Dg Toe Great Left  11/16/2015  CLINICAL DATA:  Left great toe pain for 6 months. EXAM: LEFT GREAT TOE COMPARISON:  Radiographs of Sep 06, 2015. FINDINGS: There is interval development of lytic destruction involving the distal portion of the first proximal phalanx and proximal base of the first distal  phalanx. This is consistent with acute osteomyelitis. Vascular calcifications are noted. No radiopaque foreign body is noted. IMPRESSION: Findings consistent with acute osteomyelitis involving the first proximal and distal phalanges. Electronically Signed   By: Marijo Conception, M.D.   On: 11/16/2015 12:19    Positive ROS: All other systems have been reviewed and were otherwise negative with the exception of those mentioned in the HPI and as above.  Objective: Labs cbc  Recent Labs  11/16/15 1135  WBC 7.9  HGB 11.0*  HCT 32.3*  PLT 285    Labs inflam  Recent Labs  11/16/15 1135  CRP <0.5    Labs coag No results for input(s): INR, PTT in the last 72 hours.  Invalid input(s): PT   Recent Labs  11/16/15 1135  NA 140  K 3.7  CL 109  CO2 22  GLUCOSE 65  BUN 12  CREATININE 1.74*  CALCIUM 9.2    Physical Exam: Filed Vitals:   11/16/15 1533 11/16/15 1600  BP: 151/87 140/83  Pulse: 85 84  Temp:    Resp: 20 17   General: Alert, no acute distress. Family at bedside Cardiovascular: No pedal edema Respiratory: No cyanosis, no use of accessory musculature GI: No organomegaly, abdomen is soft and non-tender Skin: warm and dry Neurologic: no focal deficits. Psychiatric: Patient is conversant, competent for consent with normal mood and affect  MUSCULOSKELETAL:  Left great toe pink with small approximately 3 mm ulceration with scant serous drainage medially. Mildly tender diffusely to palpation and passive ranging.Flexion and extension is present, but significantly limited. Findings sensation significantly limited although gross sensation is intact throughout. Other extremities are atraumatic with painless ROM and NVI.   Prudencio Burly III PA-C 11/16/2015 4:44 PM

## 2015-11-16 NOTE — ED Notes (Signed)
Patient comes from home states he was being seen at the [facility] for infection in the left big toe. Patient advised he has osteomyelitis and needed amputated. Patient states was suppose to see Vascular on 23 but [facility] and would just come to ER to be seen by Vascular. Patient rates pain 6/10. Patient has decreased sensation in big toes patient unable to move big toe. Pedal Pulse noted. Patient Alert and oriented on arrival. Patient states currently on an antibotic.

## 2015-11-17 DIAGNOSIS — E1122 Type 2 diabetes mellitus with diabetic chronic kidney disease: Secondary | ICD-10-CM | POA: Diagnosis not present

## 2015-11-17 DIAGNOSIS — E785 Hyperlipidemia, unspecified: Secondary | ICD-10-CM | POA: Diagnosis not present

## 2015-11-17 DIAGNOSIS — N183 Chronic kidney disease, stage 3 (moderate): Secondary | ICD-10-CM | POA: Diagnosis not present

## 2015-11-17 DIAGNOSIS — M868X7 Other osteomyelitis, ankle and foot: Secondary | ICD-10-CM | POA: Diagnosis not present

## 2015-11-17 DIAGNOSIS — I129 Hypertensive chronic kidney disease with stage 1 through stage 4 chronic kidney disease, or unspecified chronic kidney disease: Secondary | ICD-10-CM | POA: Diagnosis not present

## 2015-11-17 DIAGNOSIS — Z833 Family history of diabetes mellitus: Secondary | ICD-10-CM | POA: Diagnosis not present

## 2015-11-17 LAB — BASIC METABOLIC PANEL
Anion gap: 9 (ref 5–15)
BUN: 14 mg/dL (ref 6–20)
CALCIUM: 8.6 mg/dL — AB (ref 8.9–10.3)
CO2: 24 mmol/L (ref 22–32)
Chloride: 108 mmol/L (ref 101–111)
Creatinine, Ser: 1.77 mg/dL — ABNORMAL HIGH (ref 0.61–1.24)
GFR calc Af Amer: 43 mL/min — ABNORMAL LOW (ref >60)
GFR, EST NON AFRICAN AMERICAN: 37 mL/min — AB (ref >60)
GLUCOSE: 97 mg/dL (ref 65–99)
Potassium: 3.6 mmol/L (ref 3.5–5.1)
Sodium: 141 mmol/L (ref 135–145)

## 2015-11-17 LAB — HEMOGLOBIN A1C
HEMOGLOBIN A1C: 5.9 % — AB (ref 4.8–5.6)
MEAN PLASMA GLUCOSE: 123 mg/dL

## 2015-11-17 LAB — CBC
HCT: 32.2 % — ABNORMAL LOW (ref 39.0–52.0)
Hemoglobin: 10.4 g/dL — ABNORMAL LOW (ref 13.0–17.0)
MCH: 28.6 pg (ref 26.0–34.0)
MCHC: 32.3 g/dL (ref 30.0–36.0)
MCV: 88.5 fL (ref 78.0–100.0)
Platelets: 264 10*3/uL (ref 150–400)
RBC: 3.64 MIL/uL — ABNORMAL LOW (ref 4.22–5.81)
RDW: 13.4 % (ref 11.5–15.5)
WBC: 6 10*3/uL (ref 4.0–10.5)

## 2015-11-17 LAB — GLUCOSE, CAPILLARY
Glucose-Capillary: 111 mg/dL — ABNORMAL HIGH (ref 65–99)
Glucose-Capillary: 119 mg/dL — ABNORMAL HIGH (ref 65–99)

## 2015-11-17 MED ORDER — SULFAMETHOXAZOLE-TRIMETHOPRIM 800-160 MG PO TABS: 1.0000 | ORAL_TABLET | Freq: Two times a day (BID) | ORAL | Status: DC

## 2015-11-17 MED ORDER — HYDROCODONE-ACETAMINOPHEN 5-325 MG PO TABS: 1.0000 | ORAL_TABLET | ORAL | Status: DC | PRN

## 2015-11-17 NOTE — Discharge Summary (Signed)
Physician Discharge Summary  Matthew [name] [id number] DOB: 1945-08-14 DOA: 11/16/2015  PCP: No primary care provider on file.  Admit date: 11/16/2015 Discharge date: 11/17/2015  Time spent: *25 minutes  Recommendations for Outpatient Follow-up:  1. Follow up Dr Sharol Given in one week   Discharge Diagnoses:  Active Problems:   Osteomyelitis (HCC)   HTN (hypertension)   Diabetes mellitus, type 2 (HCC)   CKD (chronic kidney disease), stage III   Hyperlipidemia   Discharge Condition: Stable  Diet recommendation: Carb modified diet  Filed Weights   11/16/15 1900  Weight: 82.555 kg (182 lb)    History of present illness:  70 y.o. male with medical history significant for but not limited to, HTN, tobacco abuse, and DM2. Six months ago patient developed pain and redness in his left great toe. This eventually ulcerated. Patient has been under care of [facility]. Last month he was started on twice daily amoxicillin. Wife and daughter state his toe actually looks much better. Patient was having significant pain in his toe requiring frequent emergency department visits but even his pain has improved. An xray at [facility] showed bone destruction. PCP tried to get the patient to a vascular surgeon but no available appointments until late August so he was advised to come to ED.   Hospital Course:   Osteomyelitis left great toe. On BID Augmentin  at home for the last month - redness and swelling improved with vancomycin and zosyn Will discharge on bactrim Ds 1 tab po BID for 10 days. Dr Percell Miller has seen the patient , and recommend to follow up Dr Sharol Given next week  CKD, stage 3 based on today's labs. [facility] . Stable creatinine  Hypertension Continue Amlodipine 10 mg po daily    Procedures:  None   Consultations:  Orthopedics  Discharge Exam: Filed Vitals:   11/16/15 2001 11/17/15 0438  BP: 139/99 146/89  Pulse: 86 77  Temp: 98.3 F  (36.8 C) 98 F (36.7 C)  Resp: 16 15    General: Appears in no acute distress Cardiovascular: S1S2 RRR Respiratory: Clear bilaterally Ext- Left big toe noted having mild serous discharge, nontender to palpation  Discharge Instructions   Discharge Instructions    Diet - low sodium heart healthy    Complete by:  As directed      Increase activity slowly    Complete by:  As directed           Current Discharge Medication List    START taking these medications   Details  HYDROcodone-acetaminophen (NORCO/VICODIN) 5-325 MG tablet Take 1-2 tablets by mouth every 4 (four) hours as needed for moderate pain. Qty: 30 tablet, Refills: 0    sulfamethoxazole-trimethoprim (BACTRIM DS,SEPTRA DS) 800-160 MG tablet Take 1 tablet by mouth 2 (two) times daily. Qty: 14 tablet, Refills: 0      CONTINUE these medications which have NOT CHANGED   Details  amLODipine (NORVASC) 10 MG tablet Take 10 mg by mouth daily. For heart    atorvastatin (LIPITOR) 20 MG tablet Take 20 mg by mouth at bedtime. For cholesterol    ferrous sulfate 325 (65 FE) MG tablet Take 325 mg by mouth 2 (two) times daily with a meal.    gabapentin (NEURONTIN) 600 MG tablet Take 600 mg by mouth 2 (two) times daily. For peripheral neuropathy    ibuprofen (ADVIL,MOTRIN) 200 MG tablet Take 400 mg by mouth every 6 (six) hours as needed (pain).  latanoprost (XALATAN) 0.005 % ophthalmic solution Place 1 drop into both eyes at bedtime.    methocarbamol (ROBAXIN) 750 MG tablet Take 750 mg by mouth at bedtime.    Omega-3 Fatty Acids (FISH OIL) 1000 MG CAPS Take 1,000 mg by mouth 2 (two) times daily.    oxyCODONE-acetaminophen (PERCOCET) 10-325 MG tablet Take 1 tablet by mouth 3 (three) times daily. scheduled    polymixin-bacitracin (POLYSPORIN) 500-10000 UNIT/GM OINT ointment Apply 1 application topically 2 (two) times daily. Apply to big toe left foot      STOP taking these medications     amoxicillin-clavulanate  (AUGMENTIN) 500-125 MG tablet        No Known Allergies Follow-up Information    Follow up with DUDA,MARCUS V, MD In 3 days.   Specialty:  Orthopedic Surgery   Why:  Call on Monday to confirm the appointment   Contact information:   [place] [address] [phone]        The results of significant diagnostics from this hospitalization (including imaging, microbiology, ancillary and laboratory) are listed below for reference.    Significant Diagnostic Studies: Dg Toe Great Left  11/16/2015  CLINICAL DATA:  Left great toe pain for 6 months. EXAM: LEFT GREAT TOE COMPARISON:  Radiographs of Sep 06, 2015. FINDINGS: There is interval development of lytic destruction involving the distal portion of the first proximal phalanx and proximal base of the first distal phalanx. This is consistent with acute osteomyelitis. Vascular calcifications are noted. No radiopaque foreign body is noted. IMPRESSION: Findings consistent with acute osteomyelitis involving the first proximal and distal phalanges. Electronically Signed   By: Marijo Conception, M.D.   On: 11/16/2015 12:19    Microbiology: No results found for this or any previous visit (from the past 240 hour(s)).   Labs: Basic Metabolic Panel:  Recent Labs Lab 11/16/15 1135 11/17/15 0612  NA 140 141  K 3.7 3.6  CL 109 108  CO2 22 24  GLUCOSE 65 97  BUN 12 14  CREATININE 1.74* 1.77*  CALCIUM 9.2 8.6*   Liver Function Tests: No results for input(s): AST, ALT, ALKPHOS, BILITOT, PROT, ALBUMIN in the last 168 hours. No results for input(s): LIPASE, AMYLASE in the last 168 hours. No results for input(s): AMMONIA in the last 168 hours. CBC:  Recent Labs Lab 11/16/15 1135 11/17/15 0612  WBC 7.9 6.0  NEUTROABS 4.7  --   HGB 11.0* 10.4*  HCT 32.3* 32.2*  MCV 87.3 88.5  PLT 285 264    CBG:  Recent Labs Lab 11/16/15 1539 11/16/15 2135 11/17/15 0553 11/17/15 1046  GLUCAP 156* 143* 111* 119*        Signed:  Eleonore Chiquito S MD.  [facility] 11/17/2015, 11:16 AM

## 2015-11-27 ENCOUNTER — Telehealth: Payer: Self-pay | Admitting: Vascular Surgery

## 2015-11-27 NOTE — Telephone Encounter (Signed)
Contacted [facility] at [phone] and spoke with Richardson Landry. Mentioned to him that we needed a referral from their office in order to see the patient. Gave him our fax number. Richardson Landry mentioned that he will fax referral as well as the [place] [facility] approval.

## 2015-11-27 NOTE — Telephone Encounter (Signed)
Patient's wife, Meredith Mody called to schedule a new patient appointment for her husband. Mrs. Degner stated that her husband has just been approved by [place] [facility] to come to our office and that he was being referred by [facility]. I asked for the contact information for [facility] and [facility] to get faxed copies of the referral and approval and will call her back to schedule a new appointment.

## 2015-11-28 ENCOUNTER — Telehealth: Payer: Self-pay | Admitting: Vascular Surgery

## 2015-11-28 NOTE — Telephone Encounter (Signed)
I received the referral and authorization from [place] at [facility] and was able to contact the patient's wife to schedule an appointment. Matthew [name] agreed to have appointments on August 8/21 and 8/22.

## 2015-12-21 ENCOUNTER — Other Ambulatory Visit: Payer: Self-pay | Admitting: Vascular Surgery

## 2015-12-21 DIAGNOSIS — I7092 Chronic total occlusion of artery of the extremities: Secondary | ICD-10-CM

## 2015-12-24 ENCOUNTER — Ambulatory Visit (HOSPITAL_COMMUNITY)
Admission: RE | Admit: 2015-12-24 | Discharge: 2015-12-24 | Disposition: A | Discharge status: Home / Self Care | Payer: Medicare Other | Source: Ambulatory Visit | Attending: Vascular Surgery | Admitting: Vascular Surgery

## 2015-12-24 ENCOUNTER — Ambulatory Visit (HOSPITAL_COMMUNITY)
Admission: RE | Admit: 2015-12-24 | Discharge: 2015-12-24 | Disposition: A | Discharge status: Home / Self Care | Payer: Non-veteran care | Source: Ambulatory Visit | Attending: Vascular Surgery | Admitting: Vascular Surgery

## 2015-12-24 ENCOUNTER — Encounter: Payer: Self-pay | Admitting: Vascular Surgery

## 2015-12-24 DIAGNOSIS — E7800 Pure hypercholesterolemia, unspecified: Secondary | ICD-10-CM | POA: Diagnosis not present

## 2015-12-24 DIAGNOSIS — I70201 Unspecified atherosclerosis of native arteries of extremities, right leg: Secondary | ICD-10-CM | POA: Insufficient documentation

## 2015-12-24 DIAGNOSIS — I10 Essential (primary) hypertension: Secondary | ICD-10-CM | POA: Insufficient documentation

## 2015-12-24 DIAGNOSIS — I7092 Chronic total occlusion of artery of the extremities: Secondary | ICD-10-CM | POA: Insufficient documentation

## 2015-12-24 DIAGNOSIS — R938 Abnormal findings on diagnostic imaging of other specified body structures: Secondary | ICD-10-CM | POA: Insufficient documentation

## 2015-12-24 DIAGNOSIS — E119 Type 2 diabetes mellitus without complications: Secondary | ICD-10-CM | POA: Insufficient documentation

## 2015-12-24 DIAGNOSIS — R0989 Other specified symptoms and signs involving the circulatory and respiratory systems: Secondary | ICD-10-CM | POA: Diagnosis present

## 2015-12-25 ENCOUNTER — Ambulatory Visit (INDEPENDENT_AMBULATORY_CARE_PROVIDER_SITE_OTHER): Payer: Non-veteran care | Admitting: Vascular Surgery

## 2015-12-25 ENCOUNTER — Encounter: Payer: Self-pay | Admitting: Vascular Surgery

## 2015-12-25 VITALS — BP 133/88 | HR 78 | Temp 97.8°F | Resp 16 | Ht 69.0 in | Wt 193.0 lb

## 2015-12-25 DIAGNOSIS — I70245 Atherosclerosis of native arteries of left leg with ulceration of other part of foot: Secondary | ICD-10-CM

## 2015-12-25 NOTE — Progress Notes (Signed)
Patient name: Matthew [name] MRN: [id number] DOB: Jan 03, 1946 Sex: male  REASON FOR CONSULT: Peripheral vascular disease with osteomyelitis left great toe  HPI: Matthew [name] is a 70 y.o. male, in today for peripheral vascular disease. He is here today with his wife. Has a several month history of ulceration in his left great toe. Has been seen by podiatry at the [facility]. X-rays there revealed osteomyelitis with recommendation for amputation. Was [facility] last month. At that time recommendation for was for continued antibiotic treatment. He underwent noninvasive studies at the [place] which I have for review and also duplex of his arterial system and ankle arm indices in our office yesterday which have also reviewed with the patient. He does report several year history of intermittent claudication. He reports that this is become severe enough that it makes it impossible for him to mow his yard himself and has to have someone else do that. He does not have any history of cardiac disease. Does have a history of non-insulin-dependent diabetes. He does have significant neuropathy and therefore does not have pain noted in the ulceration.  Past Medical History:  Diagnosis Date  . Diabetes mellitus without complication ([place])   . High cholesterol   . Hyperlipidemia   . Hypertension     Family History  Problem Relation Age of Onset  . Leukemia Sister   . Lung cancer Brother   . Heart failure Mother   . Diabetes Father     SOCIAL HISTORY: Social History   Social History  . Marital status: Married    Spouse name: N/A  . Number of children: N/A  . Years of education: N/A   Occupational History  . Not on file.   Social History Main Topics  . Smoking status: Current Every Day Smoker    Packs/day: 0.50    Types: Cigarettes  . Smokeless tobacco: Never Used     Comment: 1/2 pk per day.   . Alcohol use No  . Drug use: No  . Sexual activity: Not on file    Other Topics Concern  . Not on file   Social History Narrative  . No narrative on file    No Known Allergies  Current Outpatient Prescriptions  Medication Sig Dispense Refill  . amLODipine (NORVASC) 10 MG tablet Take 10 mg by mouth daily. For heart    . atorvastatin (LIPITOR) 20 MG tablet Take 20 mg by mouth at bedtime. For cholesterol    . ferrous sulfate 325 (65 FE) MG tablet Take 325 mg by mouth 2 (two) times daily with a meal.    . gabapentin (NEURONTIN) 600 MG tablet Take 600 mg by mouth 2 (two) times daily. For peripheral neuropathy    . HYDROcodone-acetaminophen (NORCO/VICODIN) 5-325 MG tablet Take 1-2 tablets by mouth every 4 (four) hours as needed for moderate pain. 30 tablet 0  . ibuprofen (ADVIL,MOTRIN) 200 MG tablet Take 400 mg by mouth every 6 (six) hours as needed (pain).    Marland Kitchen latanoprost (XALATAN) 0.005 % ophthalmic solution Place 1 drop into both eyes at bedtime.    . methocarbamol (ROBAXIN) 750 MG tablet Take 750 mg by mouth at bedtime.    . Omega-3 Fatty Acids (FISH OIL) 1000 MG CAPS Take 1,000 mg by mouth 2 (two) times daily.    Marland Kitchen oxyCODONE-acetaminophen (PERCOCET) 10-325 MG tablet Take 1 tablet by mouth 3 (three) times daily. scheduled    . polymixin-bacitracin (POLYSPORIN) 500-10000 UNIT/GM OINT  ointment Apply 1 application topically 2 (two) times daily. Apply to big toe left foot    . sulfamethoxazole-trimethoprim (BACTRIM DS,SEPTRA DS) 800-160 MG tablet Take 1 tablet by mouth 2 (two) times daily. 14 tablet 0   No current facility-administered medications for this visit.     REVIEW OF SYSTEMS:  [X]  denotes positive finding, [ ]  denotes negative finding Cardiac  Comments:  Chest pain or chest pressure:    Shortness of breath upon exertion:    Short of breath when lying flat:    Irregular heart rhythm: x       Vascular    Pain in calf, thigh, or hip brought on by ambulation: x   Pain in feet at night that wakes you up from your sleep:  x   Blood clot in  your veins:    Leg swelling:  x       Pulmonary    Oxygen at home:    Productive cough:     Wheezing:         Neurologic    Sudden weakness in arms or legs:     Sudden numbness in arms or legs:     Sudden onset of difficulty speaking or slurred speech:    Temporary loss of vision in one eye:     Problems with dizziness:         Gastrointestinal    Blood in stool:     Vomited blood:         Genitourinary    Burning when urinating:     Blood in urine:        Psychiatric    Major depression:  x       Hematologic    Bleeding problems:    Problems with blood clotting too easily:        Skin    Rashes or ulcers: x       Constitutional    Fever or chills:      PHYSICAL EXAM: Vitals:   12/25/15 0914  BP: 133/88  Pulse: 78  Resp: 16  Temp: 97.8 F (36.6 C)  TempSrc: Oral  SpO2: 98%  Weight: 193 lb (87.5 kg)  Height: 5\' 9"  (1.753 m)    GENERAL: The patient is a well-nourished male, in no acute distress. The vital signs are documented above. CARDIAC: There is a regular rate and rhythm.  VASCULAR: Carotid arteries without bruits bilaterally. 2+ radial and 2+ femoral pulses bilaterally. Absent popliteal and distal pulses bilaterally PULMONARY: There is good air exchange bilaterally without wheezing or rales. ABDOMEN: Soft and non-tender with normal pitched bowel sounds.  MUSCULOSKELETAL: There are no major deformities or cyanosis. NEUROLOGIC: No focal weakness or paresthesias are detected. SKIN: Ulceration on the medial aspect of his left great toe just proximal to the nailbed. Some swelling but no drainage or erythema PSYCHIATRIC: The patient has a normal affect.  DATA:   Noninvasive studies suggest complete occlusion of the superficial femoral artery on the left and high-grade stenosis on the right. And monophasic signals in his tibial vessels with the ankle arm index of 0.48 on the left and falsely elevated 0.70 on the right  MEDICAL ISSUES:  Had a very long  discussion with the patient and his wife present. Explained that he in all likelihood does not have adequate flow for healing toe amputation if required on the left. Recommend arteriography for further evaluation. His most recent creatinine at his admission to the hospital one month ago was 1.7.  He has been told he has proximally 45% function from a renal standpoint. Explained that we would do CO2 imaging for the majority of the study and supplement this with the minimal amount of iodinated contrast to determine what would be required for treatment. Explain the outside chance for ability to treat this with angioplasty but explain most likely would require left femoral to popliteal bypass. We will coordinate arteriography as an outpatient at his earliest convenience and plan intervention to follow   Martina Brodbeck [facility] [employer] [phone]

## 2015-12-26 ENCOUNTER — Other Ambulatory Visit: Payer: Self-pay

## 2015-12-31 ENCOUNTER — Encounter (HOSPITAL_COMMUNITY)
Admission: RE | Disposition: A | Discharge status: Home / Self Care | Payer: Self-pay | Source: Ambulatory Visit | Attending: Vascular Surgery

## 2015-12-31 ENCOUNTER — Ambulatory Visit (HOSPITAL_COMMUNITY)
Admission: RE | Admit: 2015-12-31 | Discharge: 2015-12-31 | Disposition: A | Discharge status: Home / Self Care | Payer: Non-veteran care | Source: Ambulatory Visit | Attending: Vascular Surgery | Admitting: Vascular Surgery

## 2015-12-31 ENCOUNTER — Encounter (HOSPITAL_COMMUNITY): Payer: Self-pay | Admitting: *Deleted

## 2015-12-31 DIAGNOSIS — E1169 Type 2 diabetes mellitus with other specified complication: Secondary | ICD-10-CM | POA: Diagnosis not present

## 2015-12-31 DIAGNOSIS — Z8249 Family history of ischemic heart disease and other diseases of the circulatory system: Secondary | ICD-10-CM | POA: Insufficient documentation

## 2015-12-31 DIAGNOSIS — I70262 Atherosclerosis of native arteries of extremities with gangrene, left leg: Secondary | ICD-10-CM | POA: Insufficient documentation

## 2015-12-31 DIAGNOSIS — L97529 Non-pressure chronic ulcer of other part of left foot with unspecified severity: Secondary | ICD-10-CM | POA: Insufficient documentation

## 2015-12-31 DIAGNOSIS — E785 Hyperlipidemia, unspecified: Secondary | ICD-10-CM | POA: Diagnosis not present

## 2015-12-31 DIAGNOSIS — I10 Essential (primary) hypertension: Secondary | ICD-10-CM | POA: Diagnosis not present

## 2015-12-31 DIAGNOSIS — M869 Osteomyelitis, unspecified: Secondary | ICD-10-CM | POA: Insufficient documentation

## 2015-12-31 DIAGNOSIS — Z801 Family history of malignant neoplasm of trachea, bronchus and lung: Secondary | ICD-10-CM | POA: Diagnosis not present

## 2015-12-31 DIAGNOSIS — E11621 Type 2 diabetes mellitus with foot ulcer: Secondary | ICD-10-CM | POA: Insufficient documentation

## 2015-12-31 DIAGNOSIS — E1152 Type 2 diabetes mellitus with diabetic peripheral angiopathy with gangrene: Secondary | ICD-10-CM | POA: Diagnosis not present

## 2015-12-31 DIAGNOSIS — I96 Gangrene, not elsewhere classified: Secondary | ICD-10-CM | POA: Diagnosis not present

## 2015-12-31 DIAGNOSIS — Z833 Family history of diabetes mellitus: Secondary | ICD-10-CM | POA: Insufficient documentation

## 2015-12-31 DIAGNOSIS — F17210 Nicotine dependence, cigarettes, uncomplicated: Secondary | ICD-10-CM | POA: Insufficient documentation

## 2015-12-31 DIAGNOSIS — E7800 Pure hypercholesterolemia, unspecified: Secondary | ICD-10-CM | POA: Insufficient documentation

## 2015-12-31 DIAGNOSIS — I70213 Atherosclerosis of native arteries of extremities with intermittent claudication, bilateral legs: Secondary | ICD-10-CM | POA: Diagnosis present

## 2015-12-31 HISTORY — PX: PERIPHERAL VASCULAR CATHETERIZATION: SHX172C

## 2015-12-31 LAB — POCT I-STAT, CHEM 8
BUN: 10 mg/dL (ref 6–20)
Calcium, Ion: 1.19 mmol/L (ref 1.12–1.23)
Chloride: 103 mmol/L (ref 101–111)
Creatinine, Ser: 1.5 mg/dL — ABNORMAL HIGH (ref 0.61–1.24)
Glucose, Bld: 108 mg/dL — ABNORMAL HIGH (ref 65–99)
HEMATOCRIT: 40 % (ref 39.0–52.0)
HEMOGLOBIN: 13.6 g/dL (ref 13.0–17.0)
POTASSIUM: 3.7 mmol/L (ref 3.5–5.1)
Sodium: 144 mmol/L (ref 135–145)
TCO2: 27 mmol/L (ref 0–100)

## 2015-12-31 LAB — GLUCOSE, CAPILLARY: Glucose-Capillary: 93 mg/dL (ref 65–99)

## 2015-12-31 SURGERY — ABDOMINAL AORTOGRAM W/LOWER EXTREMITY

## 2015-12-31 MED ORDER — FENTANYL CITRATE (PF) 100 MCG/2ML IJ SOLN
INTRAMUSCULAR | Status: AC
  Filled 2015-12-31: qty 2

## 2015-12-31 MED ORDER — HEPARIN (PORCINE) IN NACL 2-0.9 UNIT/ML-% IJ SOLN
INTRAMUSCULAR | Status: AC
  Filled 2015-12-31: qty 1000

## 2015-12-31 MED ORDER — IODIXANOL 320 MG/ML IV SOLN
INTRAVENOUS | Status: DC | PRN
  Administered 2015-12-31: 32 mL via INTRAVENOUS

## 2015-12-31 MED ORDER — LABETALOL HCL 5 MG/ML IV SOLN
INTRAVENOUS | Status: AC
  Filled 2015-12-31: qty 4

## 2015-12-31 MED ORDER — MIDAZOLAM HCL 2 MG/2ML IJ SOLN
INTRAMUSCULAR | Status: AC
  Filled 2015-12-31: qty 2

## 2015-12-31 MED ORDER — LABETALOL HCL 5 MG/ML IV SOLN
INTRAVENOUS | Status: DC | PRN
  Administered 2015-12-31: 10 mg via INTRAVENOUS

## 2015-12-31 MED ORDER — MIDAZOLAM HCL 2 MG/2ML IJ SOLN
INTRAMUSCULAR | Status: DC | PRN
  Administered 2015-12-31: 1 mg via INTRAVENOUS

## 2015-12-31 MED ORDER — LIDOCAINE HCL (PF) 1 % IJ SOLN
INTRAMUSCULAR | Status: DC | PRN
  Administered 2015-12-31: 12 mL

## 2015-12-31 MED ORDER — HEPARIN (PORCINE) IN NACL 2-0.9 UNIT/ML-% IJ SOLN
INTRAMUSCULAR | Status: DC | PRN
  Administered 2015-12-31: 1000 mL

## 2015-12-31 MED ORDER — LIDOCAINE HCL (PF) 1 % IJ SOLN
INTRAMUSCULAR | Status: AC
  Filled 2015-12-31: qty 30

## 2015-12-31 MED ORDER — SODIUM CHLORIDE 0.9 % IV SOLN
INTRAVENOUS | Status: DC
  Administered 2015-12-31: 08:00:00 via INTRAVENOUS

## 2015-12-31 MED ORDER — HYDRALAZINE HCL 20 MG/ML IJ SOLN: 10.0000 mg | INTRAMUSCULAR | Status: DC | PRN

## 2015-12-31 MED ORDER — SODIUM CHLORIDE 0.9 % IV SOLN: 1.0000 mL/kg/h | INTRAVENOUS | Status: DC

## 2015-12-31 MED ORDER — FENTANYL CITRATE (PF) 100 MCG/2ML IJ SOLN
INTRAMUSCULAR | Status: DC | PRN
  Administered 2015-12-31: 50 ug via INTRAVENOUS

## 2015-12-31 SURGICAL SUPPLY — 16 items
CATH ANGIO 5F PIGTAIL 65CM (CATHETERS) ×3 IMPLANT
CATH CROSS OVER TEMPO 5F (CATHETERS) ×3 IMPLANT
CATH STRAIGHT 5FR 65CM (CATHETERS) ×3 IMPLANT
COVER PRB 48X5XTLSCP FOLD TPE (BAG) ×1 IMPLANT
COVER PROBE 5X48 (BAG) ×2
FILTER CO2 0.2 MICRON (VASCULAR PRODUCTS) ×3 IMPLANT
GUIDEWIRE ANGLED .035X150CM (WIRE) ×3 IMPLANT
KIT MICROINTRODUCER STIFF 5F (SHEATH) ×3 IMPLANT
KIT PV (KITS) ×3 IMPLANT
RESERVOIR CO2 (VASCULAR PRODUCTS) ×3 IMPLANT
SET FLUSH CO2 (MISCELLANEOUS) ×3 IMPLANT
SHEATH PINNACLE 5F 10CM (SHEATH) ×3 IMPLANT
SYR MEDRAD MARK V 150ML (SYRINGE) ×3 IMPLANT
TRANSDUCER W/STOPCOCK (MISCELLANEOUS) ×3 IMPLANT
TRAY PV CATH (CUSTOM PROCEDURE TRAY) ×3 IMPLANT
WIRE HITORQ VERSACORE ST 145CM (WIRE) ×3 IMPLANT

## 2015-12-31 NOTE — Op Note (Signed)
    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 Ultrasound-guided access to the right common femoral artery 2. Aortogram with iliac arteriogram using CO2 3. Selective catheterization of the left external iliac artery with left lower extremity runoff using CO2 and a total of 30 cc of contrast 4. Retrograde right femoral arteriogram with right lower extremity runoff using CO2  SURGEON: Judeth Cornfield. Scot Dock, MD, FACS  ANESTHESIA: local with sedation. The period of conscious sedation began at (9:46 AM) and ended at (0:27 AM).  During that time period, I was present face-to-face 100% of the time.  The patient was administered (1 mg of Versed and 50 g of fentanyl.). The patient's heart rate, blood pressure, and oxygen saturation were monitored by the nurse continuously during the procedure.  INDICATIONS: Matthew [name] is a 70 y.o. male who presented with oosteomyelitis of the left great toe. He presents for arteriography.  TECHNIQUE: The patient was taken to the peripheral vascular lab and was sedated as as described above. Both groins were draped in usual sterile fashion. Under ultrasound guidance, after the skin was anesthetized, the right common femoral artery was cannulated with a micropuncture needle and a micropuncture sheath introduced over wire. This was exchanged for a 5 French sheath over a versa core wire. A pigtail catheter was positioned at the L1 vertebral body and flush aortogram was obtained using CO2. This catheter was then exchanged for a crossover catheter which was positioned into the proximal left common iliac artery. An angled Glidewire was advanced into the external iliac artery and the crossover catheter exchanged for a straight catheter. Selective left external iliac arteriograms obtained with left  lower extremity runoff with CO2. I additionally obtained to spot films of the lower leg using a total of 30 cc of contrast. I then pulled the straight catheter into the proximal common iliac artery and an oblique common iliac artery projection was obtained using CO2. The straight catheter was then removed.  Next a retrograde right femoral arteriogram was obtained using CO2 with right lower extremity runoff. The patient was transferred to the holding area for removal of the sheath. No media complications were noted.  FINDINGS:  1. The infrarenal aorta, bilateral common iliac arteries, bilateral external iliac arteries and bilateral hypogastric arteries are patent. 2. On the left side, which is the site of concern, the common femoral and deep femoral artery are patent. The superficial femoral artery is occluded at its origin with reconstitution of a boring segment of the above-knee popliteal artery which is diffusely diseased. The below-knee pop to artery is occluded as is the anterior tibial artery, tibial peroneal trunk, and peroneal arteries. There is reconstitution of the posterior tibial artery in the proximal third of the leg. There is runoff into the foot although there is disease in the posterior tibial artery around the ankle. There is collateral filling of the dorsalis pedis artery. 3. On the right side, there is diffuse moderate disease throughout the superficial femoral artery and popliteal artery. The anterior tibial artery is occluded. The peroneal and posterior tibial arteries appear to be patent although this poor visualization distally.  [name], MD, FACS [facility]  DATE OF DICTATION:   12/31/2015

## 2015-12-31 NOTE — Interval H&P Note (Signed)
History and Physical Interval Note:  12/31/2015 9:29 AM  Matthew [name]  has presented today for surgery, with the diagnosis of left great toe gangrene  The various methods of treatment have been discussed with the patient and family. After consideration of risks, benefits and other options for treatment, the patient has consented to  Procedure(s): Abdominal Aortogram w/Lower Extremity (N/A) as a surgical intervention .  The patient's history has been reviewed, patient examined, no change in status, stable for surgery.  I have reviewed the patient's chart and labs.  Questions were answered to the patient's satisfaction.     [name]

## 2015-12-31 NOTE — Discharge Instructions (Signed)
Angiogram, Care After °Refer to this sheet in the next few weeks. These instructions provide you with information about caring for yourself after your procedure. Your health care provider may also give you more specific instructions. Your treatment has been planned according to current medical practices, but problems sometimes occur. Call your health care provider if you have any problems or questions after your procedure. °WHAT TO EXPECT AFTER THE PROCEDURE °After your procedure, it is typical to have the following: °· Bruising at the catheter insertion site that usually fades within 1-2 weeks. °· Blood collecting in the tissue (hematoma) that may be painful to the touch. It should usually decrease in size and tenderness within 1-2 weeks. °HOME CARE INSTRUCTIONS °· Take medicines only as directed by your health care provider. °· You may shower 24-48 hours after the procedure or as directed by your health care provider. Remove the bandage (dressing) and gently wash the site with plain soap and water. Pat the area dry with a clean towel. Do not rub the site, because this may cause bleeding. °· Do not take baths, swim, or use a hot tub until your health care provider approves. °· Check your insertion site every day for redness, swelling, or drainage. °· Do not apply powder or lotion to the site. °· Do not lift over 10 lb (4.5 kg) for 5 days after your procedure or as directed by your health care provider. °· Ask your health care provider when it is okay to: °¨ Return to work or school. °¨ Resume usual physical activities or sports. °¨ Resume sexual activity. °· Do not drive home if you are discharged the same day as the procedure. Have someone else drive you. °· You may drive 24 hours after the procedure unless otherwise instructed by your health care provider. °· Do not operate machinery or power tools for 24 hours after the procedure or as directed by your health care provider. °· If your procedure was done as an  outpatient procedure, which means that you went home the same day as your procedure, a responsible adult should be with you for the first 24 hours after you arrive home. °· Keep all follow-up visits as directed by your health care provider. This is important. °SEEK MEDICAL CARE IF: °· You have a fever. °· You have chills. °· You have increased bleeding from the catheter insertion site. Hold pressure on the site.  CALL 911 °SEEK IMMEDIATE MEDICAL CARE IF: °· You have unusual pain at the catheter insertion site. °· You have redness, warmth, or swelling at the catheter insertion site. °· You have drainage (other than a small amount of blood on the dressing) from the catheter insertion site. °· The catheter insertion site is bleeding, and the bleeding does not stop after 30 minutes of holding steady pressure on the site. °· The area near or just beyond the catheter insertion site becomes pale, cool, tingly, or numb. °  °This information is not intended to replace advice given to you by your health care provider. Make sure you discuss any questions you have with your health care provider. °  °Document Released: 11/07/2004 Document Revised: 05/12/2014 Document Reviewed: 09/22/2012 °Elsevier Interactive Patient Education ©2016 [employer] ° °

## 2015-12-31 NOTE — Interval H&P Note (Signed)
History and Physical Interval Note:  12/31/2015 7:54 AM  Matthew [name]  has presented today for surgery, with the diagnosis of left great toe gangrene  The various methods of treatment have been discussed with the patient and family. After consideration of risks, benefits and other options for treatment, the patient has consented to  Procedure(s): Abdominal Aortogram w/Lower Extremity (N/A) as a surgical intervention .  The patient's history has been reviewed, patient examined, no change in status, stable for surgery.  I have reviewed the patient's chart and labs.  Questions were answered to the patient's satisfaction.     [name]

## 2015-12-31 NOTE — Progress Notes (Signed)
Site area: rt groin sheath pulled by [employer] Site Prior to Removal:  Level 0 Pressure Applied For:  20 minutes Manual:   yes Patient Status During Pull:  stable Post Pull Site:  Level  0 Post Pull Instructions Given:  yes Post Pull Pulses Present: yes Dressing Applied:  tegaderm Bedrest begins @  [id number] Comments:

## 2015-12-31 NOTE — H&P (View-Only) (Signed)
Patient name: Matthew [name] MRN: [id number] DOB: Jan 08, 1946 Sex: male  REASON FOR CONSULT: Peripheral vascular disease with osteomyelitis left great toe  HPI: Matthew [name] is a 70 y.o. male, in today for peripheral vascular disease. He is here today with his wife. Has a several month history of ulceration in his left great toe. Has been seen by podiatry at the [facility]. X-rays there revealed osteomyelitis with recommendation for amputation. Was admitted to [facility] hospital last month. At that time recommendation for was for continued antibiotic treatment. He underwent noninvasive studies at the [place] which I have for review and also duplex of his arterial system and ankle arm indices in our office yesterday which have also reviewed with the patient. He does report several year history of intermittent claudication. He reports that this is become severe enough that it makes it impossible for him to mow his yard himself and has to have someone else do that. He does not have any history of cardiac disease. Does have a history of non-insulin-dependent diabetes. He does have significant neuropathy and therefore does not have pain noted in the ulceration.  Past Medical History:  Diagnosis Date  . Diabetes mellitus without complication ([place])   . High cholesterol   . Hyperlipidemia   . Hypertension     Family History  Problem Relation Age of Onset  . Leukemia Sister   . Lung cancer Brother   . Heart failure Mother   . Diabetes Father     SOCIAL HISTORY: Social History   Social History  . Marital status: Married    Spouse name: N/A  . Number of children: N/A  . Years of education: N/A   Occupational History  . Not on file.   Social History Main Topics  . Smoking status: Current Every Day Smoker    Packs/day: 0.50    Types: Cigarettes  . Smokeless tobacco: Never Used     Comment: 1/2 pk per day.   . Alcohol use No  . Drug use: No  . Sexual activity: Not on file    Other Topics Concern  . Not on file   Social History Narrative  . No narrative on file    No Known Allergies  Current Outpatient Prescriptions  Medication Sig Dispense Refill  . amLODipine (NORVASC) 10 MG tablet Take 10 mg by mouth daily. For heart    . atorvastatin (LIPITOR) 20 MG tablet Take 20 mg by mouth at bedtime. For cholesterol    . ferrous sulfate 325 (65 FE) MG tablet Take 325 mg by mouth 2 (two) times daily with a meal.    . gabapentin (NEURONTIN) 600 MG tablet Take 600 mg by mouth 2 (two) times daily. For peripheral neuropathy    . HYDROcodone-acetaminophen (NORCO/VICODIN) 5-325 MG tablet Take 1-2 tablets by mouth every 4 (four) hours as needed for moderate pain. 30 tablet 0  . ibuprofen (ADVIL,MOTRIN) 200 MG tablet Take 400 mg by mouth every 6 (six) hours as needed (pain).    Marland Kitchen latanoprost (XALATAN) 0.005 % ophthalmic solution Place 1 drop into both eyes at bedtime.    . methocarbamol (ROBAXIN) 750 MG tablet Take 750 mg by mouth at bedtime.    . Omega-3 Fatty Acids (FISH OIL) 1000 MG CAPS Take 1,000 mg by mouth 2 (two) times daily.    Marland Kitchen oxyCODONE-acetaminophen (PERCOCET) 10-325 MG tablet Take 1 tablet by mouth 3 (three) times daily. scheduled    . polymixin-bacitracin (POLYSPORIN) 500-10000 UNIT/GM OINT  ointment Apply 1 application topically 2 (two) times daily. Apply to big toe left foot    . sulfamethoxazole-trimethoprim (BACTRIM DS,SEPTRA DS) 800-160 MG tablet Take 1 tablet by mouth 2 (two) times daily. 14 tablet 0   No current facility-administered medications for this visit.     REVIEW OF SYSTEMS:  [X]  denotes positive finding, [ ]  denotes negative finding Cardiac  Comments:  Chest pain or chest pressure:    Shortness of breath upon exertion:    Short of breath when lying flat:    Irregular heart rhythm: x       Vascular    Pain in calf, thigh, or hip brought on by ambulation: x   Pain in feet at night that wakes you up from your sleep:  x   Blood clot in  your veins:    Leg swelling:  x       Pulmonary    Oxygen at home:    Productive cough:     Wheezing:         Neurologic    Sudden weakness in arms or legs:     Sudden numbness in arms or legs:     Sudden onset of difficulty speaking or slurred speech:    Temporary loss of vision in one eye:     Problems with dizziness:         Gastrointestinal    Blood in stool:     Vomited blood:         Genitourinary    Burning when urinating:     Blood in urine:        Psychiatric    Major depression:  x       Hematologic    Bleeding problems:    Problems with blood clotting too easily:        Skin    Rashes or ulcers: x       Constitutional    Fever or chills:      PHYSICAL EXAM: Vitals:   12/25/15 0914  BP: 133/88  Pulse: 78  Resp: 16  Temp: 97.8 F (36.6 C)  TempSrc: Oral  SpO2: 98%  Weight: 193 lb (87.5 kg)  Height: 5\' 9"  (1.753 m)    GENERAL: The patient is a well-nourished male, in no acute distress. The vital signs are documented above. CARDIAC: There is a regular rate and rhythm.  VASCULAR: Carotid arteries without bruits bilaterally. 2+ radial and 2+ femoral pulses bilaterally. Absent popliteal and distal pulses bilaterally PULMONARY: There is good air exchange bilaterally without wheezing or rales. ABDOMEN: Soft and non-tender with normal pitched bowel sounds.  MUSCULOSKELETAL: There are no major deformities or cyanosis. NEUROLOGIC: No focal weakness or paresthesias are detected. SKIN: Ulceration on the medial aspect of his left great toe just proximal to the nailbed. Some swelling but no drainage or erythema PSYCHIATRIC: The patient has a normal affect.  DATA:   Noninvasive studies suggest complete occlusion of the superficial femoral artery on the left and high-grade stenosis on the right. And monophasic signals in his tibial vessels with the ankle arm index of 0.48 on the left and falsely elevated 0.70 on the right  MEDICAL ISSUES:  Had a very long  discussion with the patient and his wife present. Explained that he in all likelihood does not have adequate flow for healing toe amputation if required on the left. Recommend arteriography for further evaluation. His most recent creatinine at his admission to the hospital one month ago was 1.7.  He has been told he has proximally 45% function from a renal standpoint. Explained that we would do CO2 imaging for the majority of the study and supplement this with the minimal amount of iodinated contrast to determine what would be required for treatment. Explain the outside chance for ability to treat this with angioplasty but explain most likely would require left femoral to popliteal bypass. We will coordinate arteriography as an outpatient at his earliest convenience and plan intervention to follow   Louie Meaders [facility] [employer] [phone]

## 2015-12-31 NOTE — Progress Notes (Signed)
Patient ID: Matthew [name], male   DOB: 1946/02/18, 70 y.o.   MRN: [id number] Reviewed the patient's arteriogram and discussed with the patient. Has complete occlusion of the superficial femoral artery at its origin with reconstitution of the very diseased above-knee popliteal and then reocclusion. Only the right is posterior tibial which  subtotally occludes at the level of the ankle.  Left great toe remains stable. No erythema or fluctuance. Had plain films suggested possible osteo-. Was scheduled to see Dr.Duda. Will continue to watch. Will need a femoral to posterior tibial bypass revascularization is required. Explained to the patient that this is likely but not certain at this point. Will await Dr. Jess Barters evaluation.

## 2016-01-01 ENCOUNTER — Telehealth: Payer: Self-pay | Admitting: Vascular Surgery

## 2016-01-01 ENCOUNTER — Encounter (HOSPITAL_COMMUNITY): Payer: Self-pay | Admitting: Vascular Surgery

## 2016-01-01 NOTE — Telephone Encounter (Signed)
I spoke w/ the patient's wife and she is aware of the appt for her husband to see Dr.Duda on 01/15/16 at 12:30pm. And also to see TFE on 01/29/16 at 9:30am. awt

## 2016-01-22 ENCOUNTER — Encounter: Payer: Self-pay | Admitting: Vascular Surgery

## 2016-01-29 ENCOUNTER — Ambulatory Visit (INDEPENDENT_AMBULATORY_CARE_PROVIDER_SITE_OTHER): Payer: Medicare Other | Admitting: Vascular Surgery

## 2016-01-29 ENCOUNTER — Encounter: Payer: Self-pay | Admitting: Vascular Surgery

## 2016-01-29 ENCOUNTER — Other Ambulatory Visit: Payer: Self-pay

## 2016-01-29 ENCOUNTER — Ambulatory Visit (HOSPITAL_COMMUNITY)
Admission: RE | Admit: 2016-01-29 | Discharge: 2016-01-29 | Disposition: A | Discharge status: Home / Self Care | Payer: Medicare Other | Source: Ambulatory Visit | Attending: Vascular Surgery | Admitting: Vascular Surgery

## 2016-01-29 VITALS — BP 117/79 | HR 90 | Temp 98.0°F | Resp 18 | Ht 69.0 in | Wt 193.6 lb

## 2016-01-29 DIAGNOSIS — I739 Peripheral vascular disease, unspecified: Secondary | ICD-10-CM | POA: Diagnosis not present

## 2016-01-29 DIAGNOSIS — Z01818 Encounter for other preprocedural examination: Secondary | ICD-10-CM | POA: Diagnosis not present

## 2016-01-29 DIAGNOSIS — I70245 Atherosclerosis of native arteries of left leg with ulceration of other part of foot: Secondary | ICD-10-CM | POA: Diagnosis not present

## 2016-01-29 NOTE — Progress Notes (Signed)
Patient name: Matthew [name] MRN: [id number] DOB: [birth year] Sex: male  REASON FOR VISIT: Follow-up ulceration left great toe  HPI: TANER RZEPKA is a 70 y.o. male here today for continued discussion. He does have a continued ulceration his left great toe. He is seen Dr. Sharol Given who is recommended toe amputation following revascularization. I did review his films again with the patient and his wife. This shows diffuse disease with occlusion of his superficial femoral artery on the left at its origin with reconstitution of the above-knee popliteal and then reocclusion at the popliteal. He has a severe tibial disease with runoff via his posterior tibial artery. This occludes at the ankle with collateralization to the anterior tibial in his foot. He understands that he does not have adequate flow for a reasonable chance of healing toe amputation due to his severe multilevel occlusive disease. He is able to walk but does have pain associated with this as well.  Past Medical History:  Diagnosis Date  . Diabetes mellitus without complication ([place])   . High cholesterol   . Hyperlipidemia   . Hypertension     Family History  Problem Relation Age of Onset  . Leukemia Sister   . Lung cancer Brother   . Heart failure Mother   . Diabetes Father     SOCIAL HISTORY: Social History  Substance Use Topics  . Smoking status: Current Every Day Smoker    Packs/day: 0.50    Types: Cigarettes  . Smokeless tobacco: Never Used     Comment: 1/2 pk per day.   . Alcohol use No    No Known Allergies  Current Outpatient Prescriptions  Medication Sig Dispense Refill  . amLODipine (NORVASC) 10 MG tablet Take 10 mg by mouth daily. For heart    . atorvastatin (LIPITOR) 20 MG tablet Take 20 mg by mouth at bedtime. For cholesterol    . ferrous sulfate 325 (65 FE) MG tablet Take 325 mg by mouth 2 (two) times daily with a meal.    . gabapentin (NEURONTIN) 600 MG tablet Take 600 mg by mouth 2 (two) times daily.  For peripheral neuropathy    . HYDROcodone-acetaminophen (NORCO/VICODIN) 5-325 MG tablet Take 1-2 tablets by mouth every 4 (four) hours as needed for moderate pain. 30 tablet 0  . ibuprofen (ADVIL,MOTRIN) 200 MG tablet Take 400 mg by mouth every 6 (six) hours as needed (pain).    Marland Kitchen latanoprost (XALATAN) 0.005 % ophthalmic solution Place 1 drop into both eyes at bedtime.    . methocarbamol (ROBAXIN) 750 MG tablet Take 750 mg by mouth at bedtime.    . Omega-3 Fatty Acids (FISH OIL) 1000 MG CAPS Take 1,000 mg by mouth 2 (two) times daily.    . polymixin-bacitracin (POLYSPORIN) 500-10000 UNIT/GM OINT ointment Apply 1 application topically 2 (two) times daily. Apply to big toe left foot    . sulfamethoxazole-trimethoprim (BACTRIM DS,SEPTRA DS) 800-160 MG tablet Take 1 tablet by mouth 2 (two) times daily. (Patient not taking: Reported on 01/29/2016) 14 tablet 0   No current facility-administered medications for this visit.     REVIEW OF SYSTEMS:  [X]  denotes positive finding, [ ]  denotes negative finding Cardiac  Comments:  Chest pain or chest pressure:    Shortness of breath upon exertion:    Short of breath when lying flat:    Irregular heart rhythm:        Vascular    Pain in calf, thigh, or hip brought on by ambulation:  Pain in feet at night that wakes you up from your sleep:     Blood clot in your veins:    Leg swelling:         Pulmonary    Oxygen at home:    Productive cough:     Wheezing:         Neurologic    Sudden weakness in arms or legs:     Sudden numbness in arms or legs:     Sudden onset of difficulty speaking or slurred speech:    Temporary loss of vision in one eye:     Problems with dizziness:         Gastrointestinal    Blood in stool:     Vomited blood:         Genitourinary    Burning when urinating:     Blood in urine:        Psychiatric    Major depression:         Hematologic    Bleeding problems:    Problems with blood clotting too easily:         Skin    Rashes or ulcers:        Constitutional    Fever or chills:      PHYSICAL EXAM: Vitals:   01/29/16 0920  BP: 117/79  Pulse: 90  Resp: 18  Temp: 98 F (36.7 C)  TempSrc: Oral  SpO2: 98%  Weight: 87.8 kg (193 lb 9.6 oz)  Height: 5\' 9"  (1.753 m)    GENERAL: The patient is a well-nourished male, in no acute distress. The vital signs are documented above.  VASCULAR: Palpable femoral and absent pedal pulses bilaterally PULMONARY: There is good air exchange  MUSCULOSKELETAL: There are no major deformities or cyanosis. NEUROLOGIC: No focal weakness or paresthesias are detected. SKIN: Ulceration over the medial aspect of his left great toe with tenderness over this and some swelling PSYCHIATRIC: The patient has a normal affect.  DATA:   He did undergo a duplex of his saphenous vein today on the left and this shows adequate caliber from the ankle to the groin. The minimal diameter throughout this is 0.35 mm.  MEDICAL ISSUES:  Do feel he will need a bypass for healing of this toe amputation. Explained the procedure with the saphenous vein harvest and bypass from the femoral artery to the posterior tibial artery. Explained the potential for failure of this as well. He understands and wished to proceed as soon as possible. We have scheduled this for 02/11/2016    Curt Jews [facility] [employer] [phone]

## 2016-02-06 NOTE — Pre-Procedure Instructions (Signed)
EDY BELT  02/06/2016      [facility] [address], [place] [address] Phone: [phone] Fax: [phone]    Your procedure is scheduled on Monday October 9  Report to [facility] Admitting at 1000 A.M.  Call this number if you have problems the morning of surgery:  [phone]   Remember:  Do not eat food or drink liquids after midnight.   Take these medicines the morning of surgery with A SIP OF WATER acetaminophen (TYLENOL),  amLODipine (NORVASC), gabapentin (NEURONTIN), HYDROcodone-acetaminophen (NORCO/VICODIN),   7 days prior to surgery STOP taking any Aspirin, Aleve, Naproxen, Ibuprofen, Motrin, Advil, Goody's, BC's, all herbal medications, fish oil, and all vitamins   Do not wear jewelry.  Do not wear lotions, powders, or cologne, or deoderant.  Men may shave face and neck.  Do not bring valuables to the hospital.  [facility] is not responsible for any belongings or valuables.  Contacts, dentures or bridgework may not be worn into surgery.  Leave your suitcase in the car.  After surgery it may be brought to your room.  For patients admitted to the hospital, discharge time will be determined by your treatment team.  Patients discharged the day of surgery will not be allowed to drive home.   Special instructions:   [facility]- Preparing For Surgery  Before surgery, you can play an important role. Because skin is not sterile, your skin needs to be as free of germs as possible. You can reduce the number of germs on your skin by washing with CHG (chlorahexidine gluconate) Soap before surgery.  CHG is an antiseptic cleaner which kills germs and bonds with the skin to continue killing germs even after washing.  Please do not use if you have an allergy to CHG or antibacterial soaps. If your skin becomes reddened/irritated stop using the CHG.  Do not shave (including legs and underarms) for at least 48 hours  prior to first CHG shower. It is OK to shave your face.  Please follow these instructions carefully.   1. Shower the NIGHT BEFORE SURGERY and the MORNING OF SURGERY with CHG.   2. If you chose to wash your hair, wash your hair first as usual with your normal shampoo.  3. After you shampoo, rinse your hair and body thoroughly to remove the shampoo.  4. Use CHG as you would any other liquid soap. You can apply CHG directly to the skin and wash gently with a scrungie or a clean washcloth.   5. Apply the CHG Soap to your body ONLY FROM THE NECK DOWN.  Do not use on open wounds or open sores. Avoid contact with your eyes, ears, mouth and genitals (private parts). Wash genitals (private parts) with your normal soap.  6. Wash thoroughly, paying special attention to the area where your surgery will be performed.  7. Thoroughly rinse your body with warm water from the neck down.  8. DO NOT shower/wash with your normal soap after using and rinsing off the CHG Soap.  9. Pat yourself dry with a CLEAN TOWEL.   10. Wear CLEAN PAJAMAS   11. Place CLEAN SHEETS on your bed the night of your first shower and DO NOT SLEEP WITH PETS.    Day of Surgery: Do not apply any deodorants/lotions. Please wear clean clothes to the hospital/surgery center.      Please read over the following fact sheets  that you were given. Pain Booklet and Surgical Site Infection Prevention

## 2016-02-07 ENCOUNTER — Encounter (HOSPITAL_COMMUNITY)
Admission: RE | Admit: 2016-02-07 | Discharge: 2016-02-07 | Disposition: A | Discharge status: Home / Self Care | Payer: Non-veteran care | Source: Ambulatory Visit | Attending: Vascular Surgery | Admitting: Vascular Surgery

## 2016-02-07 ENCOUNTER — Other Ambulatory Visit: Payer: Self-pay

## 2016-02-07 ENCOUNTER — Encounter (HOSPITAL_COMMUNITY): Payer: Self-pay

## 2016-02-07 ENCOUNTER — Encounter (HOSPITAL_COMMUNITY): Payer: Self-pay | Admitting: Emergency Medicine

## 2016-02-07 DIAGNOSIS — Z8739 Personal history of other diseases of the musculoskeletal system and connective tissue: Secondary | ICD-10-CM | POA: Insufficient documentation

## 2016-02-07 DIAGNOSIS — I739 Peripheral vascular disease, unspecified: Secondary | ICD-10-CM | POA: Insufficient documentation

## 2016-02-07 DIAGNOSIS — Z79899 Other long term (current) drug therapy: Secondary | ICD-10-CM | POA: Diagnosis not present

## 2016-02-07 DIAGNOSIS — Z01818 Encounter for other preprocedural examination: Secondary | ICD-10-CM | POA: Diagnosis present

## 2016-02-07 DIAGNOSIS — I129 Hypertensive chronic kidney disease with stage 1 through stage 4 chronic kidney disease, or unspecified chronic kidney disease: Secondary | ICD-10-CM | POA: Diagnosis not present

## 2016-02-07 DIAGNOSIS — Z01812 Encounter for preprocedural laboratory examination: Secondary | ICD-10-CM | POA: Diagnosis not present

## 2016-02-07 DIAGNOSIS — N183 Chronic kidney disease, stage 3 (moderate): Secondary | ICD-10-CM | POA: Diagnosis not present

## 2016-02-07 DIAGNOSIS — Z0183 Encounter for blood typing: Secondary | ICD-10-CM | POA: Diagnosis not present

## 2016-02-07 DIAGNOSIS — E785 Hyperlipidemia, unspecified: Secondary | ICD-10-CM | POA: Diagnosis not present

## 2016-02-07 DIAGNOSIS — G629 Polyneuropathy, unspecified: Secondary | ICD-10-CM | POA: Diagnosis not present

## 2016-02-07 HISTORY — DX: Unspecified osteoarthritis, unspecified site: M19.90

## 2016-02-07 HISTORY — DX: Polyneuropathy, unspecified: G62.9

## 2016-02-07 HISTORY — DX: Peripheral vascular disease, unspecified: I73.9

## 2016-02-07 HISTORY — DX: Post-traumatic stress disorder, unspecified: F43.10

## 2016-02-07 LAB — URINALYSIS, ROUTINE W REFLEX MICROSCOPIC
Bilirubin Urine: NEGATIVE
Glucose, UA: 500 mg/dL — AB
Hgb urine dipstick: NEGATIVE
Ketones, ur: NEGATIVE mg/dL
Leukocytes, UA: NEGATIVE
NITRITE: NEGATIVE
PH: 6 (ref 5.0–8.0)
Protein, ur: >300 mg/dL — AB
SPECIFIC GRAVITY, URINE: 1.023 (ref 1.005–1.030)

## 2016-02-07 LAB — COMPREHENSIVE METABOLIC PANEL
ALBUMIN: 3.8 g/dL (ref 3.5–5.0)
ALK PHOS: 62 U/L (ref 38–126)
ALT: 17 U/L (ref 17–63)
ANION GAP: 10 (ref 5–15)
AST: 18 U/L (ref 15–41)
BILIRUBIN TOTAL: 0.4 mg/dL (ref 0.3–1.2)
BUN: 10 mg/dL (ref 6–20)
CALCIUM: 9.5 mg/dL (ref 8.9–10.3)
CO2: 28 mmol/L (ref 22–32)
Chloride: 105 mmol/L (ref 101–111)
Creatinine, Ser: 1.59 mg/dL — ABNORMAL HIGH (ref 0.61–1.24)
GFR calc Af Amer: 49 mL/min — ABNORMAL LOW (ref >60)
GFR, EST NON AFRICAN AMERICAN: 42 mL/min — AB (ref >60)
GLUCOSE: 105 mg/dL — AB (ref 65–99)
POTASSIUM: 3.8 mmol/L (ref 3.5–5.1)
Sodium: 143 mmol/L (ref 135–145)
TOTAL PROTEIN: 6.9 g/dL (ref 6.5–8.1)

## 2016-02-07 LAB — CBC
HEMATOCRIT: 37.8 % — AB (ref 39.0–52.0)
HEMOGLOBIN: 12.7 g/dL — AB (ref 13.0–17.0)
MCH: 29.5 pg (ref 26.0–34.0)
MCHC: 33.6 g/dL (ref 30.0–36.0)
MCV: 87.9 fL (ref 78.0–100.0)
Platelets: 247 10*3/uL (ref 150–400)
RBC: 4.3 MIL/uL (ref 4.22–5.81)
RDW: 12.3 % (ref 11.5–15.5)
WBC: 6.5 10*3/uL (ref 4.0–10.5)

## 2016-02-07 LAB — URINE MICROSCOPIC-ADD ON: SQUAMOUS EPITHELIAL / LPF: NONE SEEN

## 2016-02-07 LAB — SURGICAL PCR SCREEN
MRSA, PCR: NEGATIVE
STAPHYLOCOCCUS AUREUS: NEGATIVE

## 2016-02-07 LAB — TYPE AND SCREEN
ABO/RH(D): AB NEG
Antibody Screen: NEGATIVE

## 2016-02-07 LAB — ABO/RH: ABO/RH(D): AB NEG

## 2016-02-07 LAB — PROTIME-INR
INR: 0.89
PROTHROMBIN TIME: 12.1 s (ref 11.4–15.2)

## 2016-02-07 LAB — APTT: APTT: 28 s (ref 24–36)

## 2016-02-07 NOTE — Progress Notes (Addendum)
PCP is Dr. Herold Harms ([facility]) [phone] Patient made a statement, as I was asking if he had ever seen a cardiologist, that he was sent to one by his primary to "check out the blockage in the main artery of my heart"   When questioned further, he's had no cath done, maybe just an EKG.   Denies any chest pains, sob, etc.    I have sent a fax to [place] to get the last office notes concerning this. Denies being a diabetic.  Does have some neuropathy in extremities. He stated he had "stage 3 kidney disease"

## 2016-02-08 ENCOUNTER — Encounter (HOSPITAL_COMMUNITY): Payer: Self-pay
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
## 2016-02-11 ENCOUNTER — Inpatient Hospital Stay (HOSPITAL_COMMUNITY): Admission: RE | Admit: 2016-02-11 | Payer: Non-veteran care | Source: Ambulatory Visit | Admitting: Vascular Surgery

## 2016-02-11 SURGERY — CREATION, BYPASS, ARTERIAL, FEMORAL TO TIBIAL, USING GRAFT
Anesthesia: General | Laterality: Left

## 2016-02-20 ENCOUNTER — Telehealth: Payer: Self-pay | Admitting: *Deleted

## 2016-02-20 NOTE — Telephone Encounter (Signed)
Patient's wife, Meredith Mody, called to report that Mr. Jump has an appt with the [facility] for cardiac clearance on 03-04-16. I gave her all of our contact information and address so that the [place] can send this clearance or other information to [place] asap. We will then call patient to reschedule surgery (Left Fem-post tibial bypass originally scheduled 02-11-16).

## 2016-02-21 ENCOUNTER — Ambulatory Visit (INDEPENDENT_AMBULATORY_CARE_PROVIDER_SITE_OTHER): Payer: Self-pay | Admitting: Orthopedic Surgery

## 2016-03-18 ENCOUNTER — Other Ambulatory Visit: Payer: Self-pay

## 2016-04-02 ENCOUNTER — Encounter (HOSPITAL_COMMUNITY)
Admission: RE | Admit: 2016-04-02 | Discharge: 2016-04-02 | Disposition: A | Discharge status: Home / Self Care | Payer: Non-veteran care | Source: Ambulatory Visit | Attending: Vascular Surgery | Admitting: Vascular Surgery

## 2016-04-02 ENCOUNTER — Encounter (HOSPITAL_COMMUNITY): Payer: Self-pay

## 2016-04-02 DIAGNOSIS — N183 Chronic kidney disease, stage 3 (moderate): Secondary | ICD-10-CM | POA: Diagnosis not present

## 2016-04-02 DIAGNOSIS — Z01812 Encounter for preprocedural laboratory examination: Secondary | ICD-10-CM | POA: Insufficient documentation

## 2016-04-02 DIAGNOSIS — E1142 Type 2 diabetes mellitus with diabetic polyneuropathy: Secondary | ICD-10-CM | POA: Insufficient documentation

## 2016-04-02 DIAGNOSIS — I129 Hypertensive chronic kidney disease with stage 1 through stage 4 chronic kidney disease, or unspecified chronic kidney disease: Secondary | ICD-10-CM | POA: Diagnosis not present

## 2016-04-02 DIAGNOSIS — I739 Peripheral vascular disease, unspecified: Secondary | ICD-10-CM | POA: Diagnosis not present

## 2016-04-02 DIAGNOSIS — E785 Hyperlipidemia, unspecified: Secondary | ICD-10-CM | POA: Insufficient documentation

## 2016-04-02 DIAGNOSIS — E1122 Type 2 diabetes mellitus with diabetic chronic kidney disease: Secondary | ICD-10-CM | POA: Diagnosis not present

## 2016-04-02 DIAGNOSIS — M869 Osteomyelitis, unspecified: Secondary | ICD-10-CM | POA: Diagnosis not present

## 2016-04-02 HISTORY — DX: Prediabetes: R73.03

## 2016-04-02 LAB — COMPREHENSIVE METABOLIC PANEL WITH GFR
ALT: 17 U/L (ref 17–63)
AST: 20 U/L (ref 15–41)
Albumin: 3.8 g/dL (ref 3.5–5.0)
Alkaline Phosphatase: 63 U/L (ref 38–126)
Anion gap: 6 (ref 5–15)
BUN: 16 mg/dL (ref 6–20)
CO2: 26 mmol/L (ref 22–32)
Calcium: 9.3 mg/dL (ref 8.9–10.3)
Chloride: 109 mmol/L (ref 101–111)
Creatinine, Ser: 1.9 mg/dL — ABNORMAL HIGH (ref 0.61–1.24)
GFR calc Af Amer: 39 mL/min — ABNORMAL LOW
GFR calc non Af Amer: 34 mL/min — ABNORMAL LOW
Glucose, Bld: 100 mg/dL — ABNORMAL HIGH (ref 65–99)
Potassium: 4 mmol/L (ref 3.5–5.1)
Sodium: 141 mmol/L (ref 135–145)
Total Bilirubin: 0.7 mg/dL (ref 0.3–1.2)
Total Protein: 7 g/dL (ref 6.5–8.1)

## 2016-04-02 LAB — URINE MICROSCOPIC-ADD ON

## 2016-04-02 LAB — CBC
HCT: 39.4 % (ref 39.0–52.0)
HEMOGLOBIN: 13.5 g/dL (ref 13.0–17.0)
MCH: 29.9 pg (ref 26.0–34.0)
MCHC: 34.3 g/dL (ref 30.0–36.0)
MCV: 87.2 fL (ref 78.0–100.0)
Platelets: 278 10*3/uL (ref 150–400)
RBC: 4.52 MIL/uL (ref 4.22–5.81)
RDW: 12.2 % (ref 11.5–15.5)
WBC: 5.9 10*3/uL (ref 4.0–10.5)

## 2016-04-02 LAB — URINALYSIS, ROUTINE W REFLEX MICROSCOPIC
Glucose, UA: 250 mg/dL — AB
Ketones, ur: 15 mg/dL — AB
LEUKOCYTES UA: NEGATIVE
Nitrite: NEGATIVE
Protein, ur: >300 mg/dL — AB
Specific Gravity, Urine: 1.026 (ref 1.005–1.030)
pH: 5.5 (ref 5.0–8.0)

## 2016-04-02 LAB — SURGICAL PCR SCREEN
MRSA, PCR: NEGATIVE
Staphylococcus aureus: NEGATIVE

## 2016-04-02 LAB — TYPE AND SCREEN
ABO/RH(D): AB NEG
Antibody Screen: NEGATIVE

## 2016-04-02 LAB — PROTIME-INR
INR: 1.02
Prothrombin Time: 13.4 seconds (ref 11.4–15.2)

## 2016-04-02 LAB — APTT: APTT: 29 s (ref 24–36)

## 2016-04-02 NOTE — Progress Notes (Signed)
PCP - Herold Harms Cardiologist - [facility] - requesting cardiology records  Chest x-ray - not needed EKG - 02/07/16 Stress Test - denies ECHO -denies  Cardiac Cath -denies  Will send to anesthesia for review of records     Patient denies shortness of breath, fever, cough and chest pain at PAT appointment

## 2016-04-02 NOTE — Pre-Procedure Instructions (Signed)
Matthew [name]  04/02/2016      [facility] [address], [place] [address] Phone: [phone] Fax: [phone]    Your procedure is scheduled on December 4  Report to [place] at [place].M.  Call this number if you have problems the morning of surgery:  [phone]   Remember:  Do not eat food or drink liquids after midnight.   Take these medicines the morning of surgery with A SIP OF WATER amLODipine (NORVASC),gabapentin (NEURONTIN),   oxyCODONE-acetaminophen (PERCOCET) If needed,   7 days prior to surgery STOP taking any Aspirin, Aleve, Naproxen, Ibuprofen, Motrin, Advil, Goody's, BC's, all herbal medications, fish oil, and all vitamins    Do not wear jewelry.  Do not wear lotions, powders, or cologne, or deoderant.  Men may shave face and neck.  Do not bring valuables to the hospital.  [facility] is not responsible for any belongings or valuables.  Contacts, dentures or bridgework may not be worn into surgery.  Leave your suitcase in the car.  After surgery it may be brought to your room.  For patients admitted to the hospital, discharge time will be determined by your treatment team.  Patients discharged the day of surgery will not be allowed to drive home.    Special instructions:   [place]- Preparing For Surgery  Before surgery, you can play an important role. Because skin is not sterile, your skin needs to be as free of germs as possible. You can reduce the number of germs on your skin by washing with CHG (chlorahexidine gluconate) Soap before surgery.  CHG is an antiseptic cleaner which kills germs and bonds with the skin to continue killing germs even after washing.  Please do not use if you have an allergy to CHG or antibacterial soaps. If your skin becomes reddened/irritated stop using the CHG.  Do not shave (including legs and underarms) for at least 48 hours prior to first CHG shower.  It is OK to shave your face.  Please follow these instructions carefully.   1. Shower the NIGHT BEFORE SURGERY and the MORNING OF SURGERY with CHG.   2. If you chose to wash your hair, wash your hair first as usual with your normal shampoo.  3. After you shampoo, rinse your hair and body thoroughly to remove the shampoo.  4. Use CHG as you would any other liquid soap. You can apply CHG directly to the skin and wash gently with a scrungie or a clean washcloth.   5. Apply the CHG Soap to your body ONLY FROM THE NECK DOWN.  Do not use on open wounds or open sores. Avoid contact with your eyes, ears, mouth and genitals (private parts). Wash genitals (private parts) with your normal soap.  6. Wash thoroughly, paying special attention to the area where your surgery will be performed.  7. Thoroughly rinse your body with warm water from the neck down.  8. DO NOT shower/wash with your normal soap after using and rinsing off the CHG Soap.  9. Pat yourself dry with a CLEAN TOWEL.   10. Wear CLEAN PAJAMAS   11. Place CLEAN SHEETS on your bed the night of your first shower and DO NOT SLEEP WITH PETS.    Day of Surgery: Do not apply any deodorants/lotions. Please wear clean clothes to the hospital/surgery center.      Please read over the following fact  sheets that you were given.
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
## 2016-04-06 MED ORDER — SODIUM CHLORIDE 0.9 % IV SOLN: INTRAVENOUS | Status: DC

## 2016-04-06 MED ORDER — DEXTROSE 5 % IV SOLN
1.5000 g | INTRAVENOUS | Status: AC
  Administered 2016-04-07: 1.5 g via INTRAVENOUS
  Filled 2016-04-06: qty 1.5

## 2016-04-06 NOTE — Anesthesia Preprocedure Evaluation (Addendum)
Anesthesia Evaluation  Patient identified by MRN, date of birth, ID band  Reviewed: Allergy & Precautions, NPO status , Patient's Chart, lab work & pertinent test results  Airway Mallampati: I  TM Distance: >3 FB Neck ROM: Full    Dental  (+) Teeth Intact, Missing, Poor Dentition, Dental Advisory Given   Pulmonary Current Smoker,    breath sounds clear to auscultation       Cardiovascular hypertension, + Peripheral Vascular Disease  + Valvular Problems/Murmurs AS  Rhythm:Regular Rate:Normal + Systolic murmurs    Neuro/Psych negative neurological ROS     GI/Hepatic negative GI ROS, Neg liver ROS,   Endo/Other    Renal/GU Renal InsufficiencyRenal disease     Musculoskeletal  (+) Arthritis ,   Abdominal   Peds  Hematology   Anesthesia Other Findings   Reproductive/Obstetrics                            Anesthesia Physical Anesthesia Plan  ASA: III  Anesthesia Plan: General   Post-op Pain Management:    Induction: Intravenous  Airway Management Planned: Oral ETT  Additional Equipment: Arterial line  Intra-op Plan: Utilization of Controlled Hypotension per surrgeon request  Post-operative Plan:   Informed Consent:   Dental advisory given  Plan Discussed with: CRNA  Anesthesia Plan Comments: (By criteria he seems to have at least severe AS)       Anesthesia Quick Evaluation

## 2016-04-07 ENCOUNTER — Inpatient Hospital Stay (HOSPITAL_COMMUNITY)
Admission: RE | Admit: 2016-04-07 | Discharge: 2016-04-14 | DRG: 254 | Disposition: A | Discharge status: Home Health | Payer: Non-veteran care | Source: Ambulatory Visit | Attending: Vascular Surgery | Admitting: Vascular Surgery

## 2016-04-07 ENCOUNTER — Encounter (HOSPITAL_COMMUNITY)
Admission: RE | Disposition: A | Discharge status: Home Health | Payer: Self-pay | Source: Ambulatory Visit | Attending: Vascular Surgery

## 2016-04-07 ENCOUNTER — Inpatient Hospital Stay (HOSPITAL_COMMUNITY): Payer: Non-veteran care | Admitting: Emergency Medicine

## 2016-04-07 ENCOUNTER — Inpatient Hospital Stay (HOSPITAL_COMMUNITY): Payer: Non-veteran care | Admitting: Vascular Surgery

## 2016-04-07 ENCOUNTER — Encounter (HOSPITAL_COMMUNITY): Payer: Self-pay | Admitting: Certified Registered Nurse Anesthetist

## 2016-04-07 DIAGNOSIS — M869 Osteomyelitis, unspecified: Secondary | ICD-10-CM | POA: Diagnosis not present

## 2016-04-07 DIAGNOSIS — E11621 Type 2 diabetes mellitus with foot ulcer: Secondary | ICD-10-CM | POA: Diagnosis present

## 2016-04-07 DIAGNOSIS — E1169 Type 2 diabetes mellitus with other specified complication: Secondary | ICD-10-CM | POA: Diagnosis present

## 2016-04-07 DIAGNOSIS — M1A9XX1 Chronic gout, unspecified, with tophus (tophi): Secondary | ICD-10-CM | POA: Diagnosis present

## 2016-04-07 DIAGNOSIS — M79605 Pain in left leg: Secondary | ICD-10-CM

## 2016-04-07 DIAGNOSIS — I70248 Atherosclerosis of native arteries of left leg with ulceration of other part of lower left leg: Secondary | ICD-10-CM | POA: Diagnosis not present

## 2016-04-07 DIAGNOSIS — I70245 Atherosclerosis of native arteries of left leg with ulceration of other part of foot: Secondary | ICD-10-CM | POA: Diagnosis not present

## 2016-04-07 DIAGNOSIS — E1140 Type 2 diabetes mellitus with diabetic neuropathy, unspecified: Secondary | ICD-10-CM | POA: Diagnosis present

## 2016-04-07 DIAGNOSIS — I129 Hypertensive chronic kidney disease with stage 1 through stage 4 chronic kidney disease, or unspecified chronic kidney disease: Secondary | ICD-10-CM | POA: Diagnosis present

## 2016-04-07 DIAGNOSIS — E7800 Pure hypercholesterolemia, unspecified: Secondary | ICD-10-CM | POA: Diagnosis present

## 2016-04-07 DIAGNOSIS — I350 Nonrheumatic aortic (valve) stenosis: Secondary | ICD-10-CM | POA: Diagnosis present

## 2016-04-07 DIAGNOSIS — F17210 Nicotine dependence, cigarettes, uncomplicated: Secondary | ICD-10-CM | POA: Diagnosis present

## 2016-04-07 DIAGNOSIS — F4310 Post-traumatic stress disorder, unspecified: Secondary | ICD-10-CM | POA: Diagnosis present

## 2016-04-07 DIAGNOSIS — E1151 Type 2 diabetes mellitus with diabetic peripheral angiopathy without gangrene: Secondary | ICD-10-CM | POA: Diagnosis present

## 2016-04-07 DIAGNOSIS — M1990 Unspecified osteoarthritis, unspecified site: Secondary | ICD-10-CM | POA: Diagnosis present

## 2016-04-07 DIAGNOSIS — N183 Chronic kidney disease, stage 3 (moderate): Secondary | ICD-10-CM | POA: Diagnosis present

## 2016-04-07 DIAGNOSIS — Z79899 Other long term (current) drug therapy: Secondary | ICD-10-CM

## 2016-04-07 DIAGNOSIS — L97529 Non-pressure chronic ulcer of other part of left foot with unspecified severity: Secondary | ICD-10-CM | POA: Diagnosis present

## 2016-04-07 DIAGNOSIS — R269 Unspecified abnormalities of gait and mobility: Secondary | ICD-10-CM | POA: Diagnosis not present

## 2016-04-07 DIAGNOSIS — E1122 Type 2 diabetes mellitus with diabetic chronic kidney disease: Secondary | ICD-10-CM | POA: Diagnosis present

## 2016-04-07 DIAGNOSIS — M86672 Other chronic osteomyelitis, left ankle and foot: Secondary | ICD-10-CM | POA: Diagnosis not present

## 2016-04-07 DIAGNOSIS — R2689 Other abnormalities of gait and mobility: Secondary | ICD-10-CM

## 2016-04-07 HISTORY — DX: Chronic kidney disease, stage 3 unspecified: N18.30

## 2016-04-07 HISTORY — DX: Cardiac murmur, unspecified: R01.1

## 2016-04-07 HISTORY — PX: FEMORAL-TIBIAL BYPASS GRAFT: SHX938

## 2016-04-07 HISTORY — DX: Chronic kidney disease, stage 3 (moderate): N18.3

## 2016-04-07 LAB — POCT I-STAT, CHEM 8
BUN: 15 mg/dL (ref 6–20)
Calcium, Ion: 1.15 mmol/L (ref 1.15–1.40)
Chloride: 106 mmol/L (ref 101–111)
Creatinine, Ser: 1.7 mg/dL — ABNORMAL HIGH (ref 0.61–1.24)
GLUCOSE: 131 mg/dL — AB (ref 65–99)
HEMATOCRIT: 36 % — AB (ref 39.0–52.0)
HEMOGLOBIN: 12.2 g/dL — AB (ref 13.0–17.0)
POTASSIUM: 4 mmol/L (ref 3.5–5.1)
SODIUM: 141 mmol/L (ref 135–145)
TCO2: 24 mmol/L (ref 0–100)

## 2016-04-07 LAB — CBC
HCT: 34.7 % — ABNORMAL LOW (ref 39.0–52.0)
Hemoglobin: 11.5 g/dL — ABNORMAL LOW (ref 13.0–17.0)
MCH: 29.3 pg (ref 26.0–34.0)
MCHC: 33.1 g/dL (ref 30.0–36.0)
MCV: 88.3 fL (ref 78.0–100.0)
PLATELETS: 230 10*3/uL (ref 150–400)
RBC: 3.93 MIL/uL — ABNORMAL LOW (ref 4.22–5.81)
RDW: 12.3 % (ref 11.5–15.5)
WBC: 9.2 10*3/uL (ref 4.0–10.5)

## 2016-04-07 LAB — GLUCOSE, CAPILLARY
Glucose-Capillary: 99 mg/dL (ref 65–99)
Glucose-Capillary: 194 mg/dL — ABNORMAL HIGH (ref 65–99)

## 2016-04-07 LAB — CREATININE, SERUM
CREATININE: 1.67 mg/dL — AB (ref 0.61–1.24)
GFR calc Af Amer: 46 mL/min — ABNORMAL LOW (ref >60)
GFR calc non Af Amer: 40 mL/min — ABNORMAL LOW (ref >60)

## 2016-04-07 SURGERY — CREATION, BYPASS, ARTERIAL, FEMORAL TO TIBIAL, USING GRAFT
Anesthesia: General | Site: Leg Lower | Laterality: Left

## 2016-04-07 MED ORDER — FERROUS SULFATE 325 (65 FE) MG PO TABS
325.0000 mg | ORAL_TABLET | Freq: Two times a day (BID) | ORAL | Status: DC
  Administered 2016-04-08 – 2016-04-14 (×13): 325 mg via ORAL
  Filled 2016-04-07 (×13): qty 1

## 2016-04-07 MED ORDER — FENTANYL CITRATE (PF) 250 MCG/5ML IJ SOLN
INTRAMUSCULAR | Status: AC
  Filled 2016-04-07: qty 5

## 2016-04-07 MED ORDER — METOPROLOL TARTRATE 5 MG/5ML IV SOLN: 2.0000 mg | INTRAVENOUS | Status: DC | PRN

## 2016-04-07 MED ORDER — DOUBLE ANTIBIOTIC 500-10000 UNIT/GM EX OINT
1.0000 "application " | TOPICAL_OINTMENT | Freq: Two times a day (BID) | CUTANEOUS | Status: DC
  Filled 2016-04-07 (×17): qty 1

## 2016-04-07 MED ORDER — HEPARIN SODIUM (PORCINE) 1000 UNIT/ML IJ SOLN
INTRAMUSCULAR | Status: DC | PRN
  Administered 2016-04-07: 8000 [IU] via INTRAVENOUS

## 2016-04-07 MED ORDER — CHLORHEXIDINE GLUCONATE 4 % EX LIQD: 60.0000 mL | Freq: Once | CUTANEOUS | Status: DC

## 2016-04-07 MED ORDER — ONDANSETRON HCL 4 MG/2ML IJ SOLN
INTRAMUSCULAR | Status: DC | PRN
  Administered 2016-04-07: 4 mg via INTRAVENOUS

## 2016-04-07 MED ORDER — PHENYLEPHRINE HCL 10 MG/ML IJ SOLN
INTRAMUSCULAR | Status: AC
  Filled 2016-04-07: qty 1

## 2016-04-07 MED ORDER — PROTAMINE SULFATE 10 MG/ML IV SOLN
INTRAVENOUS | Status: DC | PRN
  Administered 2016-04-07: 40 mg via INTRAVENOUS
  Administered 2016-04-07: 10 mg via INTRAVENOUS

## 2016-04-07 MED ORDER — FENTANYL CITRATE (PF) 100 MCG/2ML IJ SOLN
INTRAMUSCULAR | Status: DC | PRN
  Administered 2016-04-07: 75 ug via INTRAVENOUS
  Administered 2016-04-07: 50 ug via INTRAVENOUS
  Administered 2016-04-07 (×4): 25 ug via INTRAVENOUS
  Administered 2016-04-07: 75 ug via INTRAVENOUS

## 2016-04-07 MED ORDER — CHLORHEXIDINE GLUCONATE 4 % EX LIQD
60.0000 mL | Freq: Once | CUTANEOUS | Status: DC
Start: 2016-04-07 — End: 2016-04-07

## 2016-04-07 MED ORDER — HEPARIN SODIUM (PORCINE) 5000 UNIT/ML IJ SOLN
INTRAMUSCULAR | Status: DC | PRN
  Administered 2016-04-07: 500 mL

## 2016-04-07 MED ORDER — HYDROMORPHONE HCL 1 MG/ML IJ SOLN
0.2500 mg | INTRAMUSCULAR | Status: DC | PRN
  Administered 2016-04-07: 0.5 mg via INTRAVENOUS

## 2016-04-07 MED ORDER — METHOCARBAMOL 500 MG PO TABS
750.0000 mg | ORAL_TABLET | Freq: Every day | ORAL | Status: DC
  Administered 2016-04-07 – 2016-04-13 (×7): 750 mg via ORAL
  Filled 2016-04-07 (×7): qty 2

## 2016-04-07 MED ORDER — LABETALOL HCL 5 MG/ML IV SOLN
INTRAVENOUS | Status: AC
  Filled 2016-04-07: qty 4

## 2016-04-07 MED ORDER — ACETAMINOPHEN 325 MG PO TABS: 325.0000 mg | ORAL_TABLET | ORAL | Status: DC | PRN

## 2016-04-07 MED ORDER — IOPAMIDOL (ISOVUE-300) INJECTION 61%
INTRAVENOUS | Status: AC
  Filled 2016-04-07: qty 50

## 2016-04-07 MED ORDER — LIDOCAINE HCL (CARDIAC) 20 MG/ML IV SOLN
INTRAVENOUS | Status: DC | PRN
  Administered 2016-04-07: 100 mg via INTRAVENOUS

## 2016-04-07 MED ORDER — ATORVASTATIN CALCIUM 20 MG PO TABS
20.0000 mg | ORAL_TABLET | Freq: Every day | ORAL | Status: DC
  Administered 2016-04-07 – 2016-04-13 (×7): 20 mg via ORAL
  Filled 2016-04-07 (×7): qty 1

## 2016-04-07 MED ORDER — HYDROMORPHONE HCL 1 MG/ML IJ SOLN
INTRAMUSCULAR | Status: AC
  Filled 2016-04-07: qty 0.5

## 2016-04-07 MED ORDER — LATANOPROST 0.005 % OP SOLN
1.0000 [drp] | Freq: Every day | OPHTHALMIC | Status: DC
  Administered 2016-04-07 – 2016-04-13 (×6): 1 [drp] via OPHTHALMIC
  Filled 2016-04-07 (×2): qty 2.5

## 2016-04-07 MED ORDER — BOOST / RESOURCE BREEZE PO LIQD: 1.0000 | Freq: Three times a day (TID) | ORAL | Status: DC

## 2016-04-07 MED ORDER — PHENYLEPHRINE HCL 10 MG/ML IJ SOLN
INTRAVENOUS | Status: DC | PRN
  Administered 2016-04-07: 30 ug/min via INTRAVENOUS
  Administered 2016-04-07: 80 ug/min via INTRAVENOUS

## 2016-04-07 MED ORDER — GABAPENTIN 600 MG PO TABS
600.0000 mg | ORAL_TABLET | Freq: Three times a day (TID) | ORAL | Status: DC
  Administered 2016-04-07 – 2016-04-14 (×20): 600 mg via ORAL
  Filled 2016-04-07 (×20): qty 1

## 2016-04-07 MED ORDER — LABETALOL HCL 5 MG/ML IV SOLN
10.0000 mg | INTRAVENOUS | Status: AC | PRN
  Administered 2016-04-07 (×4): 10 mg via INTRAVENOUS
  Filled 2016-04-07 (×3): qty 4

## 2016-04-07 MED ORDER — 0.9 % SODIUM CHLORIDE (POUR BTL) OPTIME
TOPICAL | Status: DC | PRN
  Administered 2016-04-07 (×2): 1000 mL

## 2016-04-07 MED ORDER — HYDRALAZINE HCL 20 MG/ML IJ SOLN
5.0000 mg | INTRAMUSCULAR | Status: DC | PRN
  Filled 2016-04-07: qty 1

## 2016-04-07 MED ORDER — DOCUSATE SODIUM 100 MG PO CAPS
100.0000 mg | ORAL_CAPSULE | Freq: Every day | ORAL | Status: DC
  Administered 2016-04-08 – 2016-04-14 (×7): 100 mg via ORAL
  Filled 2016-04-07 (×7): qty 1

## 2016-04-07 MED ORDER — ONDANSETRON HCL 4 MG/2ML IJ SOLN: 4.0000 mg | Freq: Four times a day (QID) | INTRAMUSCULAR | Status: DC | PRN

## 2016-04-07 MED ORDER — PROMETHAZINE HCL 25 MG/ML IJ SOLN: 6.2500 mg | INTRAMUSCULAR | Status: DC | PRN

## 2016-04-07 MED ORDER — LACTATED RINGERS IV SOLN
INTRAVENOUS | Status: DC | PRN
  Administered 2016-04-07 (×2): via INTRAVENOUS

## 2016-04-07 MED ORDER — ESMOLOL HCL 100 MG/10ML IV SOLN
INTRAVENOUS | Status: DC | PRN
  Administered 2016-04-07: 20 mg via INTRAVENOUS
  Administered 2016-04-07: 50 mg via INTRAVENOUS
  Administered 2016-04-07: 30 mg via INTRAVENOUS

## 2016-04-07 MED ORDER — GUAIFENESIN-DM 100-10 MG/5ML PO SYRP: 15.0000 mL | ORAL_SOLUTION | ORAL | Status: DC | PRN

## 2016-04-07 MED ORDER — PROPOFOL 10 MG/ML IV BOLUS
INTRAVENOUS | Status: AC
  Filled 2016-04-07: qty 20

## 2016-04-07 MED ORDER — ACETAMINOPHEN 325 MG RE SUPP: 325.0000 mg | RECTAL | Status: DC | PRN

## 2016-04-07 MED ORDER — OXYCODONE-ACETAMINOPHEN 10-325 MG PO TABS: 1.0000 | ORAL_TABLET | ORAL | Status: DC | PRN

## 2016-04-07 MED ORDER — ALUM & MAG HYDROXIDE-SIMETH 200-200-20 MG/5ML PO SUSP: 15.0000 mL | ORAL | Status: DC | PRN

## 2016-04-07 MED ORDER — SODIUM CHLORIDE 0.9 % IV SOLN: 500.0000 mL | Freq: Once | INTRAVENOUS | Status: DC | PRN

## 2016-04-07 MED ORDER — ROCURONIUM BROMIDE 100 MG/10ML IV SOLN
INTRAVENOUS | Status: DC | PRN
  Administered 2016-04-07: 10 mg via INTRAVENOUS
  Administered 2016-04-07 (×3): 20 mg via INTRAVENOUS
  Administered 2016-04-07: 50 mg via INTRAVENOUS
  Administered 2016-04-07: 30 mg via INTRAVENOUS

## 2016-04-07 MED ORDER — DEXTROSE 5 % IV SOLN
1.5000 g | Freq: Two times a day (BID) | INTRAVENOUS | Status: AC
  Administered 2016-04-07 – 2016-04-08 (×2): 1.5 g via INTRAVENOUS
  Filled 2016-04-07 (×2): qty 1.5

## 2016-04-07 MED ORDER — SUGAMMADEX SODIUM 200 MG/2ML IV SOLN
INTRAVENOUS | Status: DC | PRN
  Administered 2016-04-07: 160 mg via INTRAVENOUS

## 2016-04-07 MED ORDER — PANTOPRAZOLE SODIUM 40 MG PO TBEC
40.0000 mg | DELAYED_RELEASE_TABLET | Freq: Every day | ORAL | Status: DC
  Administered 2016-04-08 – 2016-04-14 (×7): 40 mg via ORAL
  Filled 2016-04-07 (×7): qty 1

## 2016-04-07 MED ORDER — PHENYLEPHRINE HCL 10 MG/ML IJ SOLN
INTRAMUSCULAR | Status: DC | PRN
  Administered 2016-04-07 (×5): 80 ug via INTRAVENOUS

## 2016-04-07 MED ORDER — AMLODIPINE BESYLATE 10 MG PO TABS
10.0000 mg | ORAL_TABLET | Freq: Every day | ORAL | Status: DC
  Administered 2016-04-08 – 2016-04-14 (×7): 10 mg via ORAL
  Filled 2016-04-07 (×7): qty 1

## 2016-04-07 MED ORDER — PHENOL 1.4 % MT LIQD: 1.0000 | OROMUCOSAL | Status: DC | PRN

## 2016-04-07 MED ORDER — SODIUM CHLORIDE 0.9 % IV SOLN
INTRAVENOUS | Status: DC
  Administered 2016-04-07: 19:00:00 via INTRAVENOUS

## 2016-04-07 MED ORDER — POTASSIUM CHLORIDE CRYS ER 20 MEQ PO TBCR: 20.0000 meq | EXTENDED_RELEASE_TABLET | Freq: Every day | ORAL | Status: DC | PRN

## 2016-04-07 MED ORDER — BACITRACIN-NEOMYCIN-POLYMYXIN OINTMENT TUBE
TOPICAL_OINTMENT | Freq: Two times a day (BID) | CUTANEOUS | Status: DC
  Administered 2016-04-07 – 2016-04-08 (×3): via TOPICAL
  Filled 2016-04-07: qty 15

## 2016-04-07 MED ORDER — ENOXAPARIN SODIUM 40 MG/0.4ML ~~LOC~~ SOLN
40.0000 mg | SUBCUTANEOUS | Status: DC
  Administered 2016-04-08 – 2016-04-13 (×6): 40 mg via SUBCUTANEOUS
  Filled 2016-04-07 (×6): qty 0.4

## 2016-04-07 MED ORDER — PROPOFOL 10 MG/ML IV BOLUS
INTRAVENOUS | Status: DC | PRN
  Administered 2016-04-07: 150 mg via INTRAVENOUS
  Administered 2016-04-07: 50 mg via INTRAVENOUS

## 2016-04-07 MED ORDER — MAGNESIUM SULFATE 2 GM/50ML IV SOLN
2.0000 g | Freq: Every day | INTRAVENOUS | Status: DC | PRN
  Filled 2016-04-07: qty 50

## 2016-04-07 MED ORDER — MORPHINE SULFATE (PF) 4 MG/ML IV SOLN
3.0000 mg | INTRAVENOUS | Status: DC | PRN
  Administered 2016-04-07 – 2016-04-09 (×7): 3 mg via INTRAVENOUS
  Filled 2016-04-07 (×7): qty 1

## 2016-04-07 MED ORDER — OMEGA-3-ACID ETHYL ESTERS 1 G PO CAPS
1.0000 g | ORAL_CAPSULE | Freq: Two times a day (BID) | ORAL | Status: DC
  Administered 2016-04-07 – 2016-04-14 (×14): 1 g via ORAL
  Filled 2016-04-07 (×13): qty 1

## 2016-04-07 SURGICAL SUPPLY — 59 items
BANDAGE ESMARK 6X9 LF (GAUZE/BANDAGES/DRESSINGS) ×1 IMPLANT
BNDG ESMARK 6X9 LF (GAUZE/BANDAGES/DRESSINGS) ×3
CANISTER SUCTION 2500CC (MISCELLANEOUS) ×3 IMPLANT
CANNULA VESSEL 3MM 2 BLNT TIP (CANNULA) ×6 IMPLANT
CLIP LIGATING EXTRA MED SLVR (CLIP) ×3 IMPLANT
CLIP LIGATING EXTRA SM BLUE (MISCELLANEOUS) ×3 IMPLANT
CUFF TOURNIQUET SINGLE 24IN (TOURNIQUET CUFF) ×3 IMPLANT
CUFF TOURNIQUET SINGLE 34IN LL (TOURNIQUET CUFF) IMPLANT
CUFF TOURNIQUET SINGLE 44IN (TOURNIQUET CUFF) IMPLANT
DERMABOND ADHESIVE PROPEN (GAUZE/BANDAGES/DRESSINGS) ×2
DERMABOND ADVANCED (GAUZE/BANDAGES/DRESSINGS) ×6
DERMABOND ADVANCED .7 DNX12 (GAUZE/BANDAGES/DRESSINGS) ×3 IMPLANT
DERMABOND ADVANCED .7 DNX6 (GAUZE/BANDAGES/DRESSINGS) ×1 IMPLANT
DRAIN SNY 10X20 3/4 PERF (WOUND CARE) IMPLANT
DRAPE PROXIMA HALF (DRAPES) IMPLANT
DRAPE X-RAY CASS 24X20 (DRAPES) IMPLANT
DRSG COVADERM 4X10 (GAUZE/BANDAGES/DRESSINGS) IMPLANT
DRSG COVADERM 4X8 (GAUZE/BANDAGES/DRESSINGS) IMPLANT
ELECT REM PT RETURN 9FT ADLT (ELECTROSURGICAL) ×3
ELECTRODE REM PT RTRN 9FT ADLT (ELECTROSURGICAL) ×1 IMPLANT
EVACUATOR SILICONE 100CC (DRAIN) IMPLANT
GLOVE BIO SURGEON STRL SZ 6.5 (GLOVE) ×2 IMPLANT
GLOVE BIO SURGEONS STRL SZ 6.5 (GLOVE) ×1
GLOVE BIOGEL PI IND STRL 6.5 (GLOVE) ×5 IMPLANT
GLOVE BIOGEL PI IND STRL 7.0 (GLOVE) ×2 IMPLANT
GLOVE BIOGEL PI INDICATOR 6.5 (GLOVE) ×10
GLOVE BIOGEL PI INDICATOR 7.0 (GLOVE) ×4
GLOVE ECLIPSE 6.5 STRL STRAW (GLOVE) ×6 IMPLANT
GLOVE SS BIOGEL STRL SZ 7.5 (GLOVE) ×1 IMPLANT
GLOVE SUPERSENSE BIOGEL SZ 7.5 (GLOVE) ×2
GLOVE SURG SS PI 6.5 STRL IVOR (GLOVE) ×6 IMPLANT
GOWN STRL NON-REIN LRG LVL3 (GOWN DISPOSABLE) ×3 IMPLANT
GOWN STRL REUS W/ TWL LRG LVL3 (GOWN DISPOSABLE) ×4 IMPLANT
GOWN STRL REUS W/TWL LRG LVL3 (GOWN DISPOSABLE) ×8
INSERT FOGARTY SM (MISCELLANEOUS) IMPLANT
KIT BASIN OR (CUSTOM PROCEDURE TRAY) ×3 IMPLANT
KIT ROOM TURNOVER OR (KITS) ×3 IMPLANT
NS IRRIG 1000ML POUR BTL (IV SOLUTION) ×6 IMPLANT
PACK PERIPHERAL VASCULAR (CUSTOM PROCEDURE TRAY) ×3 IMPLANT
PAD ARMBOARD 7.5X6 YLW CONV (MISCELLANEOUS) ×6 IMPLANT
PADDING CAST COTTON 6X4 STRL (CAST SUPPLIES) ×3 IMPLANT
SET COLLECT BLD 21X3/4 12 (NEEDLE) IMPLANT
STAPLER VISISTAT 35W (STAPLE) IMPLANT
STOPCOCK 4 WAY LG BORE MALE ST (IV SETS) IMPLANT
SUT ETHILON 3 0 PS 1 (SUTURE) IMPLANT
SUT PROLENE 5 0 C 1 24 (SUTURE) ×6 IMPLANT
SUT PROLENE 6 0 CC (SUTURE) ×3 IMPLANT
SUT SILK 2 0 SH (SUTURE) ×3 IMPLANT
SUT SILK 3 0 (SUTURE) ×4
SUT SILK 3-0 18XBRD TIE 12 (SUTURE) ×2 IMPLANT
SUT SILK 4 0 (SUTURE) ×2
SUT SILK 4-0 18XBRD TIE 12 (SUTURE) ×1 IMPLANT
SUT VIC AB 2-0 CTX 36 (SUTURE) ×6 IMPLANT
SUT VIC AB 3-0 SH 27 (SUTURE) ×12
SUT VIC AB 3-0 SH 27X BRD (SUTURE) ×6 IMPLANT
TRAY FOLEY W/METER SILVER 16FR (SET/KITS/TRAYS/PACK) ×3 IMPLANT
TUBING EXTENTION W/L.L. (IV SETS) IMPLANT
UNDERPAD 30X30 (UNDERPADS AND DIAPERS) ×3 IMPLANT
WATER STERILE IRR 1000ML POUR (IV SOLUTION) ×3 IMPLANT

## 2016-04-07 NOTE — Anesthesia Procedure Notes (Signed)
Procedure Name: Intubation Date/Time: 04/07/2016 7:46 AM Performed by: Salli Quarry Kaelie Henigan Pre-anesthesia Checklist: Patient identified, Emergency Drugs available, Suction available and Patient being monitored Patient Re-evaluated:Patient Re-evaluated prior to inductionOxygen Delivery Method: Circle System Utilized Preoxygenation: Pre-oxygenation with 100% oxygen Intubation Type: IV induction Ventilation: Mask ventilation without difficulty Laryngoscope Size: Mac and 3 Grade View: Grade III Tube type: Oral Tube size: 7.5 mm Number of attempts: 1 Airway Equipment and Method: Stylet Placement Confirmation: ETT inserted through vocal cords under direct vision,  positive ETCO2 and breath sounds checked- equal and bilateral Secured at: 23 cm Tube secured with: Tape Dental Injury: Teeth and Oropharynx as per pre-operative assessment

## 2016-04-07 NOTE — Progress Notes (Signed)
Patients A-line reading thirty points higher than peripheral BP's with a poor wave.  Contacted PA Trihn updated on patients line instructed to utilize peripheral BP's for medication parameters.  Will continue to monitor.

## 2016-04-07 NOTE — Transfer of Care (Signed)
Immediate Anesthesia Transfer of Care Note  [name]  Procedure(s) Performed: Procedure(s): LEFT FEMORAL-POSTERIOR TIBIAL ARTERY BYPASS GRAFT (Left)  Patient Location: PACU  Anesthesia Type:General  Level of Consciousness: awake, alert  and patient cooperative  Airway & Oxygen Therapy: Patient Spontanous Breathing and Patient connected to nasal cannula oxygen  Post-op Assessment: Report given to RN and Post -op Vital signs reviewed and stable  Post vital signs: Reviewed and stable  Last Vitals:  Vitals:   04/07/16 0609  BP: (!) 173/89  Pulse: 64  Resp: 20  Temp: 36.8 C    Last Pain:  Vitals:   04/07/16 0609  TempSrc: Oral      Patients Stated Pain Goal: 3 (59/74/16 3845)  Complications: No apparent anesthesia complications

## 2016-04-07 NOTE — Op Note (Signed)
OPERATIVE REPORT  DATE OF SURGERY: 04/07/2016  [name], 70 y.o. male MRN: [id number]  DOB: 09/13/1945  PRE-OPERATIVE DIAGNOSIS: Critical limb ischemia with nonhealing ulceration left great toe  POST-OPERATIVE DIAGNOSIS:  Same  PROCEDURE: Left femoral to posterior tibial bypass with in situ great saphenous vein  SURGEON:  Curt Jews, M.D.  PHYSICIAN ASSISTANT: Silva Bandy PA-C  ANESTHESIA:  Gen.  EBL: 50 ml  Total I/O In: 1800 [I.V.:1800] Out: 575 [Urine:525; Blood:50]  BLOOD ADMINISTERED: None  DRAINS: None  SPECIMEN: None  COUNTS CORRECT:  YES  PLAN OF CARE: PACU   PATIENT DISPOSITION:  PACU - hemodynamically stable  PROCEDURE DETAILS: Patient was taken to the operative placed supine position where the area of the groin left leg prepped and using sterile fashion. SonoSite ultrasound was used to identify the level of the saphenous vein. The saphenous vein was of good caliber throughout its course. There was an area on the medial aspect of the knee where there was an anterior and a posterior branch. These reconnected just below the knee. It is difficult to determine by ultrasound which of these was the better of the 2. Incision was made in the groin and carried down to isolate the common superficial femoral and 2 large profundus femoris branches and these were controlled with vessel loops. The saphenous vein was identified through the same incision. Tributary branches at the saphenofemoral junction were ligated with 3-0 silk ties and divided. Several skin bridges were used unroofed and expose the saphenous vein from the groin to the calf. The area of the branch saphenous vein from above the knee to below knee was not left is a tunnel but was opened with the skin incision. The skin incision continued onto the calf towards the ankle to allow for distal anastomosis. Tributary branches were ligated and divided of the saphenous vein in the calf. The posterior tibial  artery was exposed the medial approach by dividing the fascia of the gastrocnemius muscle. Soleus muscle was reflected in the posterior tibial artery was exposed. It was calcified in mid calf but was so we'll double below this. The saphenous vein was mobilized further distally and was ligated and divided. Saphenous vein was gently dilated by cannulating the distal portion of the vein. The more anterior of the 2 saphenous vein I branches was the better of the 2. The tributary branches at this level were ligated and divided. The patient was given 8000 units intravenous heparin after adequate circulation time the saphenofemoral junction was occluded with a Cooley clamp and the saphenous vein was excised from the saphenofemoral junction. The vein was of good caliber. The common superficial femoral and profundus femoris arteries were occluded. The common femoral artery was entered with an 11 blade incision longitudinally with Potts scissors. The saphenous vein was spatulated and sewn end-to-side to the common femoral artery and superficial femoral artery junction with a running 5-0 Prolene suture. Anastomosis was completed and clamps removed with a good anastomosis encountered. Next the vein valves were lysed with the Mills valvulotome by entering the vein through a separate side branches beginning in the proximal thigh and then extending down to the distal thigh and knee area. The distal vein valves were lysed through the distal was a saphenous vein which had been divided. This gave good flow through the saphenous vein graft. This was and reoccluded with Serafin clamp distally. A 24 cm pneumatic tourniquet was placed in the above-knee position. The leg was elevated and exsanguinated  and was inflated to 250 mmHg. The posterior tibial artery was opened with an 11 blade incision longitudinally with Potts scissors. The vein was cut to the appropriate length and was spatulated. The to dilator passed through the stretcher  tibial artery. The vein was sewn end-to-side to the posterior tibial artery with a running 6-0 Prolene. Prior to completion of the anastomosis to dilator again was passed approximately distally to assure technically good anastomosis. The pneumatic tourniquet was deflated and after the usual flushing maneuvers anastomosis was completed. Next tributary side branches were identified using hand-held Doppler. These were all ligated. She had excellent flow through the vein graft and graft dependent signal at the posterior tibial at the ankle. The patient was given 50 mg of protamine to reverse heparin. Wounds irrigated with saline. Hemostasis tablet cautery. Wounds were closed with 2-0 Vicryl in the fascia in the groin and calf and the subcutaneous tissue and skin were closed with 3-0 Vicryl in a running fashion. Dermabond was placed over all incisions and the patient was transferred to the recovery room stable condition   [name], M.D., [facility] 04/07/2016 4:11 PM

## 2016-04-07 NOTE — Progress Notes (Signed)
  [facility] Day of Surgery Note  Subjective:  Patient seen in PACU. Soreness with incisions.   Vitals:   04/07/16 1316 04/07/16 1330  BP: (!) 143/77   Pulse: 64   Resp: 11   Temp:  97.8 F (36.6 C)   Left groin and leg incisions c/d/i. No hematoma. Brisk PT and DP doppler flow.  Assessment/Plan:  This is a 70 y.o. male who is s/p left femoral to posterior tibial artery bypass  Bypass is patent. Doing well post-op.  To 3S soon.   Virgina Jock, [place] Pager: [phone] 04/07/2016 2:29 PM

## 2016-04-07 NOTE — H&P (Signed)
Office Visit   01/29/2016 [facility] -Sharman Crate, MD  Vascular Surgery   PVD (peripheral vascular disease) [facility]) +1 more  Dx   Follow-up   Reason for Visit   Additional Documentation   Vitals:   BP 117/79 (BP Location: Left Arm, Patient Position: Sitting, Cuff Size: Normal)   Pulse 90   Temp 98 F (36.7 C) (Oral)   Resp 18   Ht 5\' 9"  (1.753 m)   Wt 193 lb 9.6 oz (87.8 kg)   SpO2 98%   BMI 28.59 kg/m   BSA 2.07 m   Flowsheets:   Infectious Disease Screening,   Healthcare Directives,   Amb Nursing Assessment,   Custom Formula Data,   Vital Signs,   MEWS Score,   Anthropometrics     Encounter Info:   Billing Info,   History,   Allergies,   Detailed Report     All Notes   Progress Notes by [name], MD at 01/29/2016 9:30 AM   Author: [name], MD Author Type: Physician Filed: 01/29/2016 11:14 AM  Note Status: Signed Cosign: Cosign Not Required Encounter Date: 01/29/2016 9:30 AM  Editor: [name], MD (Physician)      Patient name: Matthew [name]           MRN: [id number]        DOB: 1945-10-22          Sex: male  REASON FOR VISIT: Follow-up ulceration left great toe  HPI: Matthew [name] is a 70 y.o. male here today for continued discussion. He does have a continued ulceration his left great toe. He is seen Dr. Sharol Given who is recommended toe amputation following revascularization. I did review his films again with the patient and his wife. This shows diffuse disease with occlusion of his superficial femoral artery on the left at its origin with reconstitution of the above-knee popliteal and then reocclusion at the popliteal. He has a severe tibial disease with runoff via his posterior tibial artery. This occludes at the ankle with collateralization to the anterior tibial in his foot. He understands that he does not have adequate flow for a reasonable chance of healing toe amputation due to his severe multilevel occlusive  disease. He is able to walk but does have pain associated with this as well.      Past Medical History:  Diagnosis Date  . Diabetes mellitus without complication ([place])   . High cholesterol   . Hyperlipidemia   . Hypertension          Family History  Problem Relation Age of Onset  . Leukemia Sister   . Lung cancer Brother   . Heart failure Mother   . Diabetes Father     SOCIAL HISTORY:       Social History  Substance Use Topics  . Smoking status: Current Every Day Smoker    Packs/day: 0.50    Types: Cigarettes  . Smokeless tobacco: Never Used     Comment: 1/2 pk per day.   . Alcohol use No    No Known Allergies        Current Outpatient Prescriptions  Medication Sig Dispense Refill  . amLODipine (NORVASC) 10 MG tablet Take 10 mg by mouth daily. For heart    . atorvastatin (LIPITOR) 20 MG tablet Take 20 mg by mouth at bedtime. For cholesterol    . ferrous sulfate 325 (65 FE) MG tablet Take 325 mg by  mouth 2 (two) times daily with a meal.    . gabapentin (NEURONTIN) 600 MG tablet Take 600 mg by mouth 2 (two) times daily. For peripheral neuropathy    . HYDROcodone-acetaminophen (NORCO/VICODIN) 5-325 MG tablet Take 1-2 tablets by mouth every 4 (four) hours as needed for moderate pain. 30 tablet 0  . ibuprofen (ADVIL,MOTRIN) 200 MG tablet Take 400 mg by mouth every 6 (six) hours as needed (pain).    Marland Kitchen latanoprost (XALATAN) 0.005 % ophthalmic solution Place 1 drop into both eyes at bedtime.    . methocarbamol (ROBAXIN) 750 MG tablet Take 750 mg by mouth at bedtime.    . Omega-3 Fatty Acids (FISH OIL) 1000 MG CAPS Take 1,000 mg by mouth 2 (two) times daily.    . polymixin-bacitracin (POLYSPORIN) 500-10000 UNIT/GM OINT ointment Apply 1 application topically 2 (two) times daily. Apply to big toe left foot    . sulfamethoxazole-trimethoprim (BACTRIM DS,SEPTRA DS) 800-160 MG tablet Take 1 tablet by mouth 2 (two) times daily. (Patient not  taking: Reported on 01/29/2016) 14 tablet 0   No current facility-administered medications for this visit.     REVIEW OF SYSTEMS:  [X]  denotes positive finding, [ ]  denotes negative finding Cardiac  Comments:  Chest pain or chest pressure:    Shortness of breath upon exertion:    Short of breath when lying flat:    Irregular heart rhythm:        Vascular    Pain in calf, thigh, or hip brought on by ambulation:    Pain in feet at night that wakes you up from your sleep:     Blood clot in your veins:    Leg swelling:         Pulmonary    Oxygen at home:    Productive cough:     Wheezing:         Neurologic    Sudden weakness in arms or legs:     Sudden numbness in arms or legs:     Sudden onset of difficulty speaking or slurred speech:    Temporary loss of vision in one eye:     Problems with dizziness:         Gastrointestinal    Blood in stool:     Vomited blood:         Genitourinary    Burning when urinating:     Blood in urine:        Psychiatric    Major depression:         Hematologic    Bleeding problems:    Problems with blood clotting too easily:        Skin    Rashes or ulcers:        Constitutional    Fever or chills:      PHYSICAL EXAM:    Vitals:   01/29/16 0920  BP: 117/79  Pulse: 90  Resp: 18  Temp: 98 F (36.7 C)  TempSrc: Oral  SpO2: 98%  Weight: 87.8 kg (193 lb 9.6 oz)  Height: 5\' 9"  (1.753 m)    GENERAL: The patient is a well-nourished male, in no acute distress. The vital signs are documented above.  VASCULAR: Palpable femoral and absent pedal pulses bilaterally PULMONARY: There is good air exchange  MUSCULOSKELETAL: There are no major deformities or cyanosis. NEUROLOGIC: No focal weakness or paresthesias are detected. SKIN: Ulceration over the medial aspect of his left great toe with tenderness over this and some  swelling PSYCHIATRIC: The patient has a normal affect.  DATA:   He did undergo a duplex of his saphenous vein today on the left and this shows adequate caliber from the ankle to the groin. The minimal diameter throughout this is 0.35 mm.  MEDICAL ISSUES:  Do feel he will need a bypass for healing of this toe amputation. Explained the procedure with the saphenous vein harvest and bypass from the femoral artery to the posterior tibial artery. Explained the potential for failure of this as well. He understands and wished to proceed as soon as possible. We have scheduled this for 02/11/2016    Curt Jews [facility] [phone]      Instructions   Addendum:  The patient has been re-examined and re-evaluated.  The patient's history and physical has been reviewed and is unchanged.    PILAR CORRALES is a 70 y.o. male is being admitted with Peripheral Vascular Disease with Left Great Toe Ulcer  I70.245. All the risks, benefits and other treatment options have been discussed with the patient. The patient has consented to proceed with Procedure(s): BYPASS GRAFT LEFT FEMORAL-POSTERIOR TIBIAL ARTERY as a surgical intervention.  Curt Jews 04/07/2016 7:07 AM [facility]

## 2016-04-07 NOTE — Anesthesia Postprocedure Evaluation (Signed)
Anesthesia Post Note  [name]  Procedure(s) Performed: Procedure(s) (LRB): LEFT FEMORAL-POSTERIOR TIBIAL ARTERY BYPASS GRAFT (Left)  Patient location during evaluation: PACU Anesthesia Type: General Level of consciousness: awake, sedated and oriented Pain management: pain level controlled Vital Signs Assessment: post-procedure vital signs reviewed and stable Respiratory status: spontaneous breathing, nonlabored ventilation, respiratory function stable and patient connected to nasal cannula oxygen Cardiovascular status: blood pressure returned to baseline and stable Postop Assessment: no signs of nausea or vomiting Anesthetic complications: no    Last Vitals:  Vitals:   04/07/16 1316 04/07/16 1330  BP: (!) 143/77   Pulse: 64   Resp: 11   Temp:  36.6 C    Last Pain:  Vitals:   04/07/16 1300  TempSrc:   PainSc: 0-No pain                 Gilmore List,JAMES TERRILL

## 2016-04-08 ENCOUNTER — Other Ambulatory Visit (INDEPENDENT_AMBULATORY_CARE_PROVIDER_SITE_OTHER): Payer: Self-pay | Admitting: Orthopedic Surgery

## 2016-04-08 ENCOUNTER — Encounter (HOSPITAL_COMMUNITY): Payer: Self-pay | Admitting: Vascular Surgery

## 2016-04-08 DIAGNOSIS — I70248 Atherosclerosis of native arteries of left leg with ulceration of other part of lower left leg: Secondary | ICD-10-CM

## 2016-04-08 DIAGNOSIS — M86672 Other chronic osteomyelitis, left ankle and foot: Secondary | ICD-10-CM

## 2016-04-08 LAB — BASIC METABOLIC PANEL
Anion gap: 8 (ref 5–15)
BUN: 8 mg/dL (ref 6–20)
CO2: 24 mmol/L (ref 22–32)
CREATININE: 1.64 mg/dL — AB (ref 0.61–1.24)
Calcium: 8.2 mg/dL — ABNORMAL LOW (ref 8.9–10.3)
Chloride: 106 mmol/L (ref 101–111)
GFR, EST AFRICAN AMERICAN: 47 mL/min — AB (ref >60)
GFR, EST NON AFRICAN AMERICAN: 40 mL/min — AB (ref >60)
Glucose, Bld: 87 mg/dL (ref 65–99)
POTASSIUM: 4.1 mmol/L (ref 3.5–5.1)
SODIUM: 138 mmol/L (ref 135–145)

## 2016-04-08 LAB — CBC
HEMATOCRIT: 30.6 % — AB (ref 39.0–52.0)
Hemoglobin: 10.2 g/dL — ABNORMAL LOW (ref 13.0–17.0)
MCH: 29.5 pg (ref 26.0–34.0)
MCHC: 33.3 g/dL (ref 30.0–36.0)
MCV: 88.4 fL (ref 78.0–100.0)
PLATELETS: 197 10*3/uL (ref 150–400)
RBC: 3.46 MIL/uL — AB (ref 4.22–5.81)
RDW: 12.7 % (ref 11.5–15.5)
WBC: 6.5 10*3/uL (ref 4.0–10.5)

## 2016-04-08 LAB — GLUCOSE, CAPILLARY: Glucose-Capillary: 109 mg/dL — ABNORMAL HIGH (ref 65–99)

## 2016-04-08 MED ORDER — OXYCODONE-ACETAMINOPHEN 5-325 MG PO TABS
1.0000 | ORAL_TABLET | ORAL | Status: DC | PRN
  Administered 2016-04-08 – 2016-04-13 (×20): 1 via ORAL
  Filled 2016-04-08 (×21): qty 1

## 2016-04-08 MED ORDER — OXYCODONE HCL 5 MG PO TABS
5.0000 mg | ORAL_TABLET | ORAL | Status: DC | PRN
  Administered 2016-04-08 – 2016-04-14 (×27): 5 mg via ORAL
  Filled 2016-04-08 (×27): qty 1

## 2016-04-08 MED ORDER — OXYCODONE-ACETAMINOPHEN 10-325 MG PO TABS: 1.0000 | ORAL_TABLET | ORAL | Status: DC | PRN

## 2016-04-08 NOTE — Care Management Note (Signed)
Case Management Note  Patient Details  Name: BURT PIATEK MRN: [id number] Date of Birth: 1946/01/13  Subjective/Objective:   S/p l fem pop, he is to have surgery Wed or Friday for left great toe amputation, he lives with his wife, pta indep with a cane.  He is a English as a second language teacher, his PCP is Herold Harms phone  [phone] ext [phone] fax number is [phone]  in [place], he states he gets his medications from [facility].  At DC NCM will need to fax dc summary attention Dr. Rondell Reams.   Await pt/ot eval.  NCM will cont to follow for dc needs.                  Action/Plan:   Expected Discharge Date:                  Expected Discharge Plan:  [place]  In-House Referral:     Discharge planning Services  CM Consult  Post Acute Care Choice:    Choice offered to:     DME Arranged:    DME Agency:     HH Arranged:    [place] Agency:     Status of Service:  In process, will continue to follow  If discussed at Long Length of Stay Meetings, dates discussed:    Additional Comments:  Zenon Mayo, RN 04/08/2016, 10:32 AM

## 2016-04-08 NOTE — Progress Notes (Signed)
Nutrition Brief Note  Patient identified on the Malnutrition Screening Tool (MST) Report. Weight loss has been minimal, and is not significant for the time frame.   Wt Readings from Last 15 Encounters:  04/08/16 192 lb 7.4 oz (87.3 kg)  04/02/16 192 lb 8 oz (87.3 kg)  02/07/16 196 lb 12.8 oz (89.3 kg)  01/29/16 193 lb 9.6 oz (87.8 kg)  12/31/15 190 lb (86.2 kg)  12/25/15 193 lb (87.5 kg)  11/16/15 182 lb (82.6 kg)    Body mass index is 29.26 kg/m. Patient meets criteria for overweight based on current BMI.   Current diet order is heart healthy, patient is consuming approximately 100% of meals at this time. Labs and medications reviewed.   No nutrition interventions warranted at this time. If nutrition issues arise, please consult RD.   Molli Barrows, RD, LDN, [place] Pager [phone] After Hours Pager [phone]

## 2016-04-08 NOTE — Progress Notes (Addendum)
  [facility] Progress Note  Subjective  - POD #1  Doing well this am.   Objective Vitals:   04/07/16 2303 04/08/16 0300  BP: 116/67   Pulse: 87   Resp: 16   Temp: 99.1 F (37.3 C) 99.1 F (37.3 C)    Intake/Output Summary (Last 24 hours) at 04/08/16 0734 Last data filed at 04/08/16 0309  Gross per 24 hour  Intake             2780 ml  Output              975 ml  Net             1805 ml    Left leg incisions intact. Some serosanguinous drainage from medial calf incision. Palpable graft pulse.  Ulceration left great toe.  Assessment/Planning: 70 y.o. male is s/p: left femoral to posterior tibial bypass with in situ great saphenous vein 1 Day Post-Op   Bypass is patent.  Stable this am.  D/c foley and a-line. Dr. Sharol Given planning on left great toe amputation tomorrow or Friday.  Mobilize today. Saline lock IV. Transfer to 2W.  DVT prophylaxis:  Lovenox.  Alvia Grove 04/08/2016 7:34 AM --  Laboratory CBC    Component Value Date/Time   WBC 6.5 04/08/2016 0528   HGB 10.2 (L) 04/08/2016 0528   HCT 30.6 (L) 04/08/2016 0528   PLT 197 04/08/2016 0528    BMET    Component Value Date/Time   NA 138 04/08/2016 0528   K 4.1 04/08/2016 0528   CL 106 04/08/2016 0528   CO2 24 04/08/2016 0528   GLUCOSE 87 04/08/2016 0528   BUN 8 04/08/2016 0528   CREATININE 1.64 (H) 04/08/2016 0528   CALCIUM 8.2 (L) 04/08/2016 0528   GFRNONAA 40 (L) 04/08/2016 0528   GFRAA 47 (L) 04/08/2016 0528    COAG Lab Results  Component Value Date   INR 1.02 04/02/2016   INR 0.89 02/07/2016   No results found for: PTT  Antibiotics Anti-infectives    Start     Dose/Rate Route Frequency Ordered Stop   04/07/16 2000  cefUROXime (ZINACEF) 1.5 g in dextrose 5 % 50 mL IVPB     1.5 g 100 mL/hr over 30 Minutes Intravenous Every 12 hours 04/07/16 1517 04/08/16 1959   04/07/16 0600  cefUROXime (ZINACEF) 1.5 g in dextrose 5 % 50 mL IVPB     1.5 g 100 mL/hr over 30  Minutes Intravenous 30 min pre-op 04/06/16 1430 04/07/16 0804       Virgina Jock, PA-C [facility] Office: [phone] Pager: [phone] 04/08/2016 7:34 AM  I have examined the patient, reviewed and agree with above. Easily palpable subcuticular graft pulse. Appreciate Dr. Jess Barters evaluation with plan for toe amputation. Transfer to 2 Olena Heckle, MD 04/08/2016 7:45 AM
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
## 2016-04-09 ENCOUNTER — Inpatient Hospital Stay (HOSPITAL_COMMUNITY): Payer: Non-veteran care | Admitting: Anesthesiology

## 2016-04-09 ENCOUNTER — Encounter (HOSPITAL_COMMUNITY)
Admission: RE | Disposition: A | Discharge status: Home Health | Payer: Self-pay | Source: Ambulatory Visit | Attending: Vascular Surgery

## 2016-04-09 ENCOUNTER — Telehealth: Payer: Self-pay | Admitting: Vascular Surgery

## 2016-04-09 ENCOUNTER — Encounter (HOSPITAL_COMMUNITY): Payer: Self-pay | Admitting: Anesthesiology

## 2016-04-09 ENCOUNTER — Encounter (HOSPITAL_COMMUNITY): Payer: Medicare Other

## 2016-04-09 DIAGNOSIS — M869 Osteomyelitis, unspecified: Secondary | ICD-10-CM

## 2016-04-09 HISTORY — PX: AMPUTATION: SHX166

## 2016-04-09 LAB — GLUCOSE, CAPILLARY
Glucose-Capillary: 115 mg/dL — ABNORMAL HIGH (ref 65–99)
Glucose-Capillary: 109 mg/dL — ABNORMAL HIGH (ref 65–99)

## 2016-04-09 LAB — URIC ACID: Uric Acid, Serum: 6.5 mg/dL (ref 4.4–7.6)

## 2016-04-09 SURGERY — AMPUTATION DIGIT
Anesthesia: General | Site: Toe | Laterality: Left

## 2016-04-09 MED ORDER — ONDANSETRON HCL 4 MG/2ML IJ SOLN: 4.0000 mg | Freq: Four times a day (QID) | INTRAMUSCULAR | Status: DC | PRN

## 2016-04-09 MED ORDER — ACETAMINOPHEN 650 MG RE SUPP: 650.0000 mg | Freq: Four times a day (QID) | RECTAL | Status: DC | PRN

## 2016-04-09 MED ORDER — ACETAMINOPHEN 325 MG PO TABS
650.0000 mg | ORAL_TABLET | Freq: Four times a day (QID) | ORAL | Status: DC | PRN
  Administered 2016-04-10 – 2016-04-11 (×2): 650 mg via ORAL
  Filled 2016-04-09 (×2): qty 2

## 2016-04-09 MED ORDER — CEFAZOLIN IN D5W 1 GM/50ML IV SOLN
1.0000 g | Freq: Four times a day (QID) | INTRAVENOUS | Status: AC
  Administered 2016-04-09 – 2016-04-10 (×3): 1 g via INTRAVENOUS
  Filled 2016-04-09 (×3): qty 50

## 2016-04-09 MED ORDER — 0.9 % SODIUM CHLORIDE (POUR BTL) OPTIME
TOPICAL | Status: DC | PRN
  Administered 2016-04-09: 1000 mL

## 2016-04-09 MED ORDER — LABETALOL HCL 5 MG/ML IV SOLN: 10.0000 mg | INTRAVENOUS | Status: DC | PRN

## 2016-04-09 MED ORDER — CEFAZOLIN SODIUM-DEXTROSE 2-3 GM-% IV SOLR
INTRAVENOUS | Status: DC | PRN
  Administered 2016-04-09: 2 g via INTRAVENOUS

## 2016-04-09 MED ORDER — METOCLOPRAMIDE HCL 5 MG PO TABS: 5.0000 mg | ORAL_TABLET | Freq: Three times a day (TID) | ORAL | Status: DC | PRN

## 2016-04-09 MED ORDER — FENTANYL CITRATE (PF) 100 MCG/2ML IJ SOLN: 25.0000 ug | INTRAMUSCULAR | Status: DC | PRN

## 2016-04-09 MED ORDER — LABETALOL HCL 5 MG/ML IV SOLN
10.0000 mg | Freq: Once | INTRAVENOUS | Status: AC
  Administered 2016-04-09: 10 mg via INTRAVENOUS

## 2016-04-09 MED ORDER — PHENYLEPHRINE HCL 10 MG/ML IJ SOLN
INTRAMUSCULAR | Status: DC | PRN
  Administered 2016-04-09: 80 ug via INTRAVENOUS

## 2016-04-09 MED ORDER — METHOCARBAMOL 500 MG PO TABS: 500.0000 mg | ORAL_TABLET | Freq: Four times a day (QID) | ORAL | Status: DC | PRN

## 2016-04-09 MED ORDER — SODIUM CHLORIDE 0.9 % IV SOLN
INTRAVENOUS | Status: DC
  Administered 2016-04-11: 19:00:00 via INTRAVENOUS

## 2016-04-09 MED ORDER — ONDANSETRON HCL 4 MG/2ML IJ SOLN
INTRAMUSCULAR | Status: DC | PRN
  Administered 2016-04-09: 4 mg via INTRAVENOUS

## 2016-04-09 MED ORDER — ONDANSETRON HCL 4 MG PO TABS: 4.0000 mg | ORAL_TABLET | Freq: Four times a day (QID) | ORAL | Status: DC | PRN

## 2016-04-09 MED ORDER — ONDANSETRON HCL 4 MG/2ML IJ SOLN
INTRAMUSCULAR | Status: AC
  Filled 2016-04-09: qty 2

## 2016-04-09 MED ORDER — PROPOFOL 10 MG/ML IV BOLUS
INTRAVENOUS | Status: DC | PRN
  Administered 2016-04-09: 200 mg via INTRAVENOUS

## 2016-04-09 MED ORDER — LABETALOL HCL 5 MG/ML IV SOLN
INTRAVENOUS | Status: AC
  Filled 2016-04-09: qty 4

## 2016-04-09 MED ORDER — METHOCARBAMOL 1000 MG/10ML IJ SOLN
500.0000 mg | Freq: Four times a day (QID) | INTRAVENOUS | Status: DC | PRN
  Filled 2016-04-09: qty 5

## 2016-04-09 MED ORDER — OXYCODONE HCL 5 MG/5ML PO SOLN: 5.0000 mg | Freq: Once | ORAL | Status: DC | PRN

## 2016-04-09 MED ORDER — LACTATED RINGERS IV SOLN
INTRAVENOUS | Status: DC | PRN
  Administered 2016-04-09: 11:00:00 via INTRAVENOUS

## 2016-04-09 MED ORDER — FENTANYL CITRATE (PF) 100 MCG/2ML IJ SOLN
INTRAMUSCULAR | Status: AC
  Filled 2016-04-09: qty 2

## 2016-04-09 MED ORDER — LIDOCAINE HCL (CARDIAC) 20 MG/ML IV SOLN
INTRAVENOUS | Status: DC | PRN
  Administered 2016-04-09: 80 mg via INTRAVENOUS

## 2016-04-09 MED ORDER — LACTATED RINGERS IV SOLN
INTRAVENOUS | Status: DC
  Administered 2016-04-09: 11:00:00 via INTRAVENOUS

## 2016-04-09 MED ORDER — OXYCODONE HCL 5 MG PO TABS: 5.0000 mg | ORAL_TABLET | Freq: Once | ORAL | Status: DC | PRN

## 2016-04-09 MED ORDER — PROPOFOL 10 MG/ML IV BOLUS
INTRAVENOUS | Status: AC
  Filled 2016-04-09: qty 20

## 2016-04-09 MED ORDER — PHENYLEPHRINE 40 MCG/ML (10ML) SYRINGE FOR IV PUSH (FOR BLOOD PRESSURE SUPPORT)
PREFILLED_SYRINGE | INTRAVENOUS | Status: AC
  Filled 2016-04-09: qty 10

## 2016-04-09 MED ORDER — METOCLOPRAMIDE HCL 5 MG/ML IJ SOLN: 5.0000 mg | Freq: Three times a day (TID) | INTRAMUSCULAR | Status: DC | PRN

## 2016-04-09 SURGICAL SUPPLY — 30 items
BLADE SURG 21 STRL SS (BLADE) ×3 IMPLANT
BNDG COHESIVE 4X5 TAN STRL (GAUZE/BANDAGES/DRESSINGS) ×3 IMPLANT
BNDG ESMARK 4X9 LF (GAUZE/BANDAGES/DRESSINGS) IMPLANT
BNDG GAUZE ELAST 4 BULKY (GAUZE/BANDAGES/DRESSINGS) ×3 IMPLANT
COVER SURGICAL LIGHT HANDLE (MISCELLANEOUS) ×6 IMPLANT
DRAPE U-SHAPE 47X51 STRL (DRAPES) ×3 IMPLANT
DRSG ADAPTIC 3X8 NADH LF (GAUZE/BANDAGES/DRESSINGS) ×3 IMPLANT
DRSG PAD ABDOMINAL 8X10 ST (GAUZE/BANDAGES/DRESSINGS) ×3 IMPLANT
DURAPREP 26ML APPLICATOR (WOUND CARE) ×3 IMPLANT
ELECT REM PT RETURN 9FT ADLT (ELECTROSURGICAL) ×3
ELECTRODE REM PT RTRN 9FT ADLT (ELECTROSURGICAL) ×1 IMPLANT
GAUZE SPONGE 4X4 12PLY STRL (GAUZE/BANDAGES/DRESSINGS) ×3 IMPLANT
GLOVE BIOGEL PI IND STRL 9 (GLOVE) ×1 IMPLANT
GLOVE BIOGEL PI INDICATOR 9 (GLOVE) ×2
GLOVE SURG ORTHO 9.0 STRL STRW (GLOVE) ×3 IMPLANT
GOWN STRL REUS W/ TWL XL LVL3 (GOWN DISPOSABLE) ×2 IMPLANT
GOWN STRL REUS W/TWL XL LVL3 (GOWN DISPOSABLE) ×4
KIT BASIN OR (CUSTOM PROCEDURE TRAY) ×3 IMPLANT
KIT ROOM TURNOVER OR (KITS) ×3 IMPLANT
MANIFOLD NEPTUNE II (INSTRUMENTS) ×3 IMPLANT
NEEDLE 22X1 1/2 (OR ONLY) (NEEDLE) IMPLANT
NS IRRIG 1000ML POUR BTL (IV SOLUTION) ×3 IMPLANT
PACK ORTHO EXTREMITY (CUSTOM PROCEDURE TRAY) ×3 IMPLANT
PAD ARMBOARD 7.5X6 YLW CONV (MISCELLANEOUS) ×6 IMPLANT
SUCTION FRAZIER HANDLE 10FR (MISCELLANEOUS)
SUCTION TUBE FRAZIER 10FR DISP (MISCELLANEOUS) IMPLANT
SUT ETHILON 2 0 PSLX (SUTURE) ×3 IMPLANT
SYR CONTROL 10ML LL (SYRINGE) IMPLANT
TOWEL OR 17X24 6PK STRL BLUE (TOWEL DISPOSABLE) ×3 IMPLANT
TOWEL OR 17X26 10 PK STRL BLUE (TOWEL DISPOSABLE) ×3 IMPLANT

## 2016-04-09 NOTE — Anesthesia Procedure Notes (Signed)
Procedure Name: LMA Insertion Date/Time: 04/09/2016 11:17 AM Performed by: Manus Gunning, Marilea Gwynne J Pre-anesthesia Checklist: Patient identified, Emergency Drugs available, Suction available, Patient being monitored and Timeout performed Patient Re-evaluated:Patient Re-evaluated prior to inductionOxygen Delivery Method: Circle system utilized Preoxygenation: Pre-oxygenation with 100% oxygen Intubation Type: IV induction Ventilation: Mask ventilation without difficulty LMA: LMA inserted LMA Size: 5.0 Number of attempts: 1 Placement Confirmation: positive ETCO2 and breath sounds checked- equal and bilateral Tube secured with: Tape Dental Injury: Teeth and Oropharynx as per pre-operative assessment

## 2016-04-09 NOTE — Telephone Encounter (Signed)
-----   Message from Denman George, RN sent at 04/08/2016 10:54 AM EST ----- Regarding: needs f/u in 2-3 weeks with Dr. Donnetta Hutching; s/p left fem-pop BP   ----- Message ----- From: Alvia Grove, PA-C Sent: 04/08/2016  10:05 AM To: Vvs Charge Pool  S/p left femoral to popliteal bypass with in situ great saphenous vein 04/07/16  F/u with Dr. Donnetta Hutching in 2-3 weeks.  Thanks Maudie Mercury

## 2016-04-09 NOTE — Interval H&P Note (Signed)
History and Physical Interval Note:  04/09/2016 6:37 AM  Matthew [name]  has presented today for surgery, with the diagnosis of Osteomyelitis Left Great Toe  The various methods of treatment have been discussed with the patient and family. After consideration of risks, benefits and other options for treatment, the patient has consented to  Procedure(s): Left Great Toe Amputation at MTP Joint (Left) as a surgical intervention .  The patient's history has been reviewed, patient examined, no change in status, stable for surgery.  I have reviewed the patient's chart and labs.  Questions were answered to the patient's satisfaction.     [name]

## 2016-04-09 NOTE — Transfer of Care (Signed)
Immediate Anesthesia Transfer of Care Note  [name]  Procedure(s) Performed: Procedure(s): Left Great Toe Amputation at MTP Joint (Left)  Patient Location: PACU  Anesthesia Type:General  Level of Consciousness: awake  Airway & Oxygen Therapy: Patient Spontanous Breathing  Post-op Assessment: Report given to RN and Post -op Vital signs reviewed and stable  Post vital signs: Reviewed and stable  Last Vitals:  Vitals:   04/08/16 2033 04/09/16 0552  BP: 130/62 (!) 157/69  Pulse: 88 88  Resp: 20 20  Temp: 37.7 C 37.3 C    Last Pain:  Vitals:   04/09/16 0814  TempSrc:   PainSc: 7       Patients Stated Pain Goal: 3 (18/59/09 3112)  Complications: No apparent anesthesia complications

## 2016-04-09 NOTE — Op Note (Signed)
04/07/2016 - 04/09/2016  11:37 AM  PATIENT:  Matthew [name]    PRE-OPERATIVE DIAGNOSIS:  Osteomyelitis Left Great Toe  POST-OPERATIVE DIAGNOSIS:  Osteomyelitis left great toe,  Tophaceous gout  PROCEDURE:  Left Great Toe Amputation at MTP Joint  SURGEON:  [name], MD  PHYSICIAN ASSISTANT:None ANESTHESIA:   General  PREOPERATIVE INDICATIONS:  Matthew [name] is a  70 y.o. male with a diagnosis of Osteomyelitis Left Great Toe who failed conservative measures and elected for surgical management.    The risks benefits and alternatives were discussed with the patient preoperatively including but not limited to the risks of infection, bleeding, nerve injury, cardiopulmonary complications, the need for revision surgery, among others, and the patient was willing to proceed.  OPERATIVE IMPLANTS: None  OPERATIVE FINDINGS: Tophaceous gout left great toe  OPERATIVE PROCEDURE: Patient brought the operating room and underwent a general anesthetic. After adequate levels anesthesia obtained patient's left lower extremity was prepped using DuraPrep draped into a sterile field a timeout was called. A fishmouth incision was made just distal to the MTP joint. The great toes amputation through the MTP joint. Of note patient had massive amount of tophaceous deposits within the MTP joint. A Ronjair was used to remove the tophaceous deposits. There is good bleeding of the bone. The wound was irrigated with normal saline. The incision was closed using 2-0 nylon. A sterile compressive dressing was applied.

## 2016-04-09 NOTE — Progress Notes (Addendum)
[facility]  Subjective  -  Comfortable over all.  Plan to go to OR for left great toe amputation.   Objective (!) 157/69 88 99.1 F (37.3 C) (Oral) 20 95%  Intake/Output Summary (Last 24 hours) at 04/09/16 0742 Last data filed at 04/09/16 0554  Gross per 24 hour  Intake          1345.83 ml  Output             1650 ml  Net          -304.17 ml    Left leg incisions healing well, groin is soft Doppler signal   Assessment/Planning: POD # 2 left femoral to posterior tibial bypass with in situ great saphenous vein NPO for OR amputation left great toe by Dr. Lynden Oxford, [facility] 04/09/2016 7:42 AM --  Laboratory Lab Results:  Recent Labs  04/07/16 1647 04/08/16 0528  WBC 9.2 6.5  HGB 11.5* 10.2*  HCT 34.7* 30.6*  PLT 230 197   BMET  Recent Labs  04/07/16 0620 04/07/16 1647 04/08/16 0528  NA 141  --  138  K 4.0  --  4.1  CL 106  --  106  CO2  --   --  24  GLUCOSE 131*  --  87  BUN 15  --  8  CREATININE 1.70* 1.67* 1.64*  CALCIUM  --   --  8.2*    COAG Lab Results  Component Value Date   INR 1.02 04/02/2016   INR 0.89 02/07/2016   No results found for: PTT  I have examined the patient, reviewed and agree with above.Easily palpable graft pulse at the level of the knee. Wounds all healing.  Curt Jews, MD 04/09/2016 8:31 AM
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
## 2016-04-09 NOTE — Anesthesia Preprocedure Evaluation (Signed)
Anesthesia Evaluation  Patient identified by MRN, date of birth, ID band Patient awake    Reviewed: Allergy & Precautions, H&P , NPO status , Patient's Chart, lab work & pertinent test results  Airway Mallampati: II   Neck ROM: full    Dental   Pulmonary Current Smoker,    breath sounds clear to auscultation       Cardiovascular hypertension, + Peripheral Vascular Disease  + Valvular Problems/Murmurs AS  Rhythm:regular Rate:Normal  Mod-severe AS.  Cleared by cardiology for surgery.     Neuro/Psych    GI/Hepatic   Endo/Other  diabetes, Type 2  Renal/GU Renal InsufficiencyRenal disease     Musculoskeletal  (+) Arthritis ,   Abdominal   Peds  Hematology   Anesthesia Other Findings   Reproductive/Obstetrics                             Anesthesia Physical Anesthesia Plan  ASA: III  Anesthesia Plan: General   Post-op Pain Management:    Induction: Intravenous  Airway Management Planned: LMA  Additional Equipment:   Intra-op Plan:   Post-operative Plan:   Informed Consent: I have reviewed the patients History and Physical, chart, labs and discussed the procedure including the risks, benefits and alternatives for the proposed anesthesia with the patient or authorized representative who has indicated his/her understanding and acceptance.     Plan Discussed with: CRNA, Anesthesiologist and Surgeon  Anesthesia Plan Comments:         Anesthesia Quick Evaluation
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
## 2016-04-09 NOTE — Progress Notes (Signed)
PT Cancellation Note  Patient Details Name: Matthew [name] MRN: [id number] DOB: [birth year]   Cancelled Treatment:    Reason Eval/Treat Not Completed: Patient at procedure or test/unavailable. Pt off floor, in OR for L great toe amputation. Acute PT to return post surgery when appropriate to re-assess d/c recommendations and mobility.   [name] [name] 04/09/2016, 11:32 AM   Kittie Plater, PT, DPT Pager #: [phone] Office #: [phone]

## 2016-04-09 NOTE — Progress Notes (Signed)
Orthopedic Tech Progress Note Patient Details:  Matthew [name] October 25, 1945 [id number]  Ortho Devices Type of Ortho Device: Postop shoe/boot Ortho Device/Splint Location: lle Ortho Device/Splint Interventions: Application   [name] 04/09/2016, 3:26 PM

## 2016-04-09 NOTE — Telephone Encounter (Signed)
Sched appt 05/06/16 at 11:15. Spoke to pt's wife to inform them of appt.

## 2016-04-10 ENCOUNTER — Encounter (HOSPITAL_COMMUNITY): Payer: Self-pay | Admitting: Orthopedic Surgery

## 2016-04-10 ENCOUNTER — Inpatient Hospital Stay (HOSPITAL_COMMUNITY): Payer: Non-veteran care

## 2016-04-10 DIAGNOSIS — I70248 Atherosclerosis of native arteries of left leg with ulceration of other part of lower left leg: Secondary | ICD-10-CM

## 2016-04-10 LAB — GLUCOSE, CAPILLARY: GLUCOSE-CAPILLARY: 140 mg/dL — AB (ref 65–99)

## 2016-04-10 NOTE — Progress Notes (Signed)
Subjective: Interval History: none..  Overall with improving soreness  Objective: Vital signs in last 24 hours: Temp:  [98.3 F (36.8 C)-100 F (37.8 C)] 99.9 F (37.7 C) (12/07 0505) Pulse Rate:  [80-95] 87 (12/07 0505) Resp:  [8-20] 20 (12/07 0505) BP: (111-183)/(49-94) 111/58 (12/07 0505) SpO2:  [94 %-99 %] 94 % (12/07 0505)  Intake/Output from previous day: 12/06 0701 - 12/07 0700 In: 23 [P.O.:240; I.V.:520; IV Piggyback:50] Out: 905 [Urine:900; Blood:5] Intake/Output this shift: No intake/output data recorded.  Incisions all healing well. Easily palpable subcuticular posterior tibial bypass at the level of the knee  Lab Results:  Recent Labs  04/07/16 1647 04/08/16 0528  WBC 9.2 6.5  HGB 11.5* 10.2*  HCT 34.7* 30.6*  PLT 230 197   BMET  Recent Labs  04/07/16 1647 04/08/16 0528  NA  --  138  K  --  4.1  CL  --  106  CO2  --  24  GLUCOSE  --  87  BUN  --  8  CREATININE 1.67* 1.64*  CALCIUM  --  8.2*    Studies/Results: No results found. Anti-infectives: Anti-infectives    Start     Dose/Rate Route Frequency Ordered Stop   04/09/16 1700  ceFAZolin (ANCEF) IVPB 1 g/50 mL premix     1 g 100 mL/hr over 30 Minutes Intravenous Every 6 hours 04/09/16 1300 04/10/16 1059   04/07/16 2000  cefUROXime (ZINACEF) 1.5 g in dextrose 5 % 50 mL IVPB     1.5 g 100 mL/hr over 30 Minutes Intravenous Every 12 hours 04/07/16 1517 04/08/16 0943   04/07/16 0600  cefUROXime (ZINACEF) 1.5 g in dextrose 5 % 50 mL IVPB     1.5 g 100 mL/hr over 30 Minutes Intravenous 30 min pre-op 04/06/16 1430 04/07/16 0804      Assessment/Plan: s/p Procedure(s): Left Great Toe Amputation at MTP Joint (Left) Stable overall. Dr.Duda feels safe for discharge on Saturday according to the patient   LOS: 3 days   Bonham Zingale 04/10/2016, 7:09 AM

## 2016-04-10 NOTE — Progress Notes (Signed)
OT Cancellation Note  Patient Details Name: Matthew [name] MRN: [id number] DOB: Jun 27, 1945   Cancelled Treatment:    Reason Eval/Treat Not Completed: Pain limiting ability to participate.  Pt is due for pain medications shortly and had pain when I checked on him 30 minutes ago.  Will try to see him after his medications, if possible.  Zykeria Laguardia 04/10/2016, 2:34 PM  Lesle Chris, OTR/L [phone] 04/10/2016

## 2016-04-10 NOTE — Anesthesia Postprocedure Evaluation (Signed)
Anesthesia Post Note  [name]  Procedure(s) Performed: Procedure(s) (LRB): Left Great Toe Amputation at MTP Joint (Left)  Patient location during evaluation: PACU Anesthesia Type: General Level of consciousness: awake and alert and patient cooperative Pain management: pain level controlled Vital Signs Assessment: post-procedure vital signs reviewed and stable Respiratory status: spontaneous breathing and respiratory function stable Cardiovascular status: stable Anesthetic complications: no    Last Vitals:  Vitals:   04/09/16 2037 04/10/16 0505  BP: (!) 123/49 (!) 111/58  Pulse: 95 87  Resp: 20 20  Temp: 37.8 C 37.7 C    Last Pain:  Vitals:   04/10/16 0505  TempSrc: Oral  PainSc:                  [place] S

## 2016-04-10 NOTE — Progress Notes (Signed)
VASCULAR LAB PRELIMINARY  ARTERIAL  ABI completed: Right ABI indicates moderate reduction in arterial flow.  There is no change since previous study done 12/24/15.  Left ABI indicates severe reduction in arterial flow.  No change since previous study done 12/24/15.  However, unable to Doppler left posterior tibial artery, secondary to bandaging from toe amputation.    RIGHT    LEFT    PRESSURE WAVEFORM  PRESSURE WAVEFORM  BRACHIAL 185 T BRACHIAL    DP   DP    AT 128 DM AT 78 DM  PT 78 DM PT Bandages   PER   PER    GREAT TOE  NA GREAT TOE  NA    RIGHT LEFT  ABI 0.69 0.42     Matthew [name], RVT 04/10/2016, 12:11 PM
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
## 2016-04-10 NOTE — Progress Notes (Signed)
Patient ID: Matthew [name], male   DOB: 1946/01/19, 70 y.o.   MRN: [id number] Postoperative day 1 amputation left great toe at the MTP joint. 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 Postoperatively his uric acid is 6.5. Physical therapy progressive ambulation touchdown weightbearing on the left. Patient may be discharged from an orthopedic standpoint once he is safe with ambulation. Patient will need to have his uric acid level checked in the future.

## 2016-04-11 MED ORDER — HYDROCODONE-ACETAMINOPHEN 5-325 MG PO TABS: 1.0000 | ORAL_TABLET | ORAL | 0 refills | Status: DC | PRN

## 2016-04-11 NOTE — Clinical Social Work Placement (Signed)
   CLINICAL SOCIAL WORK PLACEMENT  NOTE  Date:  04/11/2016  Patient Details  Name: Matthew [name] MRN: [id number] Date of Birth: 03-08-1946  Clinical Social Work is seeking post-discharge placement for this patient at the [place] level of care (*CSW will initial, date and re-position this form in  chart as items are completed):  Yes   Patient/family provided with [facility] Work Department's list of facilities offering this level of care within the geographic area requested by the patient (or if unable, by the patient's family).  Yes   Patient/family informed of their freedom to choose among providers that offer the needed level of care, that participate in Medicare, Medicaid or managed care program needed by the patient, have an available bed and are willing to accept the patient.  Yes   Patient/family informed of [facility]'s ownership interest in [facility] and [facility], as well as of the fact that they are under no obligation to receive care at these facilities.  PASRR submitted to EDS on       PASRR number received on 04/11/16     Existing PASRR number confirmed on       FL2 transmitted to all facilities in geographic area requested by pt/family on       FL2 transmitted to all facilities within larger geographic area on       Patient informed that his/her managed care company has contracts with or will negotiate with certain facilities, including the following:            Patient/family informed of bed offers received.  Patient chooses bed at       Physician recommends and patient chooses bed at      Patient to be transferred to   on  .  Patient to be transferred to facility by       Patient family notified on   of transfer.  Name of family member notified:        PHYSICIAN Please sign FL2     Additional Comment:    _______________________________________________ Wende Neighbors, LCSW 04/11/2016, 1:46 PM

## 2016-04-11 NOTE — Discharge Summary (Signed)
[facility] Discharge Summary   Patient ID:  Matthew [name] MRN: [id number] DOB/AGE: 1945-06-13 70 y.o.  Admit date: 04/07/2016 Discharge date: 04/14/2016 Date of Surgery: 04/07/2016 - 04/09/2016 Surgeon: Juliann Mule): [name], MD  Admission Diagnosis: Peripheral Vascular Disease with Left Great Toe Ulcer  I70.245 Osteomyelitis Left Great Toe  Discharge Diagnoses:  Peripheral Vascular Disease with Left Great Toe Ulcer  I70.245 Osteomyelitis Left Great Toe  Secondary Diagnoses: Past Medical History:  Diagnosis Date  . Aortic stenosis    moderate-severe AS 01/15/16 echo ([facility])  . Arthritis    "all over" (04/07/2016)  . Chronic kidney disease (CKD), stage III (moderate)    dx from [place] in [place], [place]  . Heart murmur   . High cholesterol   . Hyperlipidemia   . Hypertension   . Neuropathy (HCC)    FEET AND HANDS  . Peripheral vascular disease ([place])   . Post traumatic stress disorder (PTSD)   . Pre-diabetes     Procedure(s): Left Great Toe Amputation at MTP Joint  Discharged Condition: good  HPI: Matthew [name] Daltonis a 70 y.o.malehere today for continued discussion. He does have a continued ulceration his left great toe. He is seen Dr. Sharol Given who is recommended toe amputation following revascularization. I did review his films again with the patient and his wife. This shows diffuse disease with occlusion of his superficial femoral artery on the left at its origin with reconstitution of the above-knee popliteal and then reocclusion at the popliteal. He has a severe tibial disease with runoff via his posterior tibial artery. This occludes at the ankle with collateralization to the anterior tibial in his foot. He understands that he does not have adequate flow for a reasonable chance of healing toe amputation due to his severe multilevel occlusive disease. He is able to walk but does have pain associated with this as well.   Hospital Course:   Matthew [name] is a 70 y.o. male is S/P Procedure(s): Left Great Toe Amputation at MTP Joint  POD#1 Left leg incisions intact. Some serosanguinous drainage from medial calf incision. Palpable graft pulse.  Ulceration left great toe.  Assessment/Planning: 70 y.o. male is s/p: left femoral to posterior tibial bypass with in situ great saphenous vein  Left leg incisions healing well, groin is soft Doppler signal   Assessment/Planning: POD # 2 left femoral to posterior tibial bypass with in situ great saphenous vein NPO for OR amputation left great toe by Dr. Martie Round is patent.  Stable this am.  D/c foley and a-line. Dr. Sharol Given planning on left great toe amputation tomorrow or Friday.  Mobilize today. Saline lock IV. Transfer to 2W.  DVT prophylaxis:  Lovenox.  04/09/2016 PROCEDURE:  Left Great Toe Amputation at MTP Joint  SURGEON:  [name], MD  Palpable by pass pulse left leg below the knee Foot dressing clean and dry from amputation by Dr. Sharol Given 04/10/2016 Left groin soft incision healing well   Assessment/Planning: POD # 3 left femoral to posterior tibial bypass with in situ great saphenous vein Patent by pass F/U  With Dr. Donnetta Hutching in 2-3 weeks  POD#1 amputation left great toe by Dr. Sharol Given.  Keep dressing clean and dry.  Do not change dressing, it will be changed in 2 weeks.   Discharge to SNF in stable condition.     Consults:   ABI completed: Right ABI indicates moderate reduction in arterial flow.  There is no change since previous  study done 12/24/15.  Left ABI indicates severe reduction in arterial flow.  No change since previous study done 12/24/15.  However, unable to Doppler left posterior tibial artery, secondary to bandaging from toe amputation.    RIGHT   LEFT    PRESSURE WAVEFORM  PRESSURE WAVEFORM  BRACHIAL 185 T BRACHIAL    DP   DP    AT 128 DM AT 78 DM  PT 78 DM PT Bandages   PER   PER    GREAT TOE  NA GREAT  TOE  NA    RIGHT LEFT  ABI 0.69 0.42     Significant Diagnostic Studies: CBC Lab Results  Component Value Date   WBC 6.5 04/08/2016   HGB 10.2 (L) 04/08/2016   HCT 30.6 (L) 04/08/2016   MCV 88.4 04/08/2016   PLT 197 04/08/2016    BMET    Component Value Date/Time   NA 138 04/08/2016 0528   K 4.1 04/08/2016 0528   CL 106 04/08/2016 0528   CO2 24 04/08/2016 0528   GLUCOSE 87 04/08/2016 0528   BUN 8 04/08/2016 0528   CREATININE 1.76 (H) 04/14/2016 0442   CALCIUM 8.2 (L) 04/08/2016 0528   GFRNONAA 37 (L) 04/14/2016 0442   GFRAA 43 (L) 04/14/2016 0442   COAG Lab Results  Component Value Date   INR 1.02 04/02/2016   INR 0.89 02/07/2016     Disposition:  Discharge to :Home Discharge Instructions    Call MD for:  redness, tenderness, or signs of infection (pain, swelling, bleeding, redness, odor or green/yellow discharge around incision site)    Complete by:  As directed    Call MD for:  severe or increased pain, loss or decreased feeling  in affected limb(s)    Complete by:  As directed    Call MD for:  temperature >100.5    Complete by:  As directed    Discharge instructions    Complete by:  As directed    Keep left foot dressing clean andr dry.  Do not remove dressing until he sees Dr. Sharol Given.   Discharge patient    Complete by:  As directed    Discharge pt to SNF   Increase activity slowly    Complete by:  As directed    Walk with assistance use walker or cane as needed   Other Restrictions    Complete by:  As directed    Heel weight bearing left foot in hard sole shoe   Resume previous diet    Complete by:  As directed        Medication List    TAKE these medications   amLODipine 10 MG tablet Commonly known as:  NORVASC Take 10 mg by mouth daily. For heart   atorvastatin 20 MG tablet Commonly known as:  LIPITOR Take 20 mg by mouth at bedtime. For cholesterol   ferrous sulfate 325 (65 FE) MG tablet Take 325 mg by mouth 2 (two) times daily  with a meal.   Fish Oil 1000 MG Caps Take 1,000 mg by mouth 2 (two) times daily.   gabapentin 600 MG tablet Commonly known as:  NEURONTIN Take 600 mg by mouth 3 (three) times daily. For peripheral neuropathy   HYDROcodone-acetaminophen 5-325 MG tablet Commonly known as:  NORCO/VICODIN Take 1-2 tablets by mouth every 4 (four) hours as needed for moderate pain.   latanoprost 0.005 % ophthalmic solution Commonly known as:  XALATAN Place 1 drop into both eyes at bedtime.  methocarbamol 750 MG tablet Commonly known as:  ROBAXIN Take 750 mg by mouth at bedtime.   oxyCODONE-acetaminophen 10-325 MG tablet Commonly known as:  PERCOCET Take 1 tablet by mouth 3 (three) times daily.   polymixin-bacitracin 500-10000 UNIT/GM Oint ointment Apply 1 application topically 2 (two) times daily. Apply to big toe left foot   sulfamethoxazole-trimethoprim 800-160 MG tablet Commonly known as:  BACTRIM DS,SEPTRA DS Take 1 tablet by mouth 2 (two) times daily.            Durable Medical Equipment        Start     Ordered   04/14/16 1509  For home use only DME 3 n 1  Once     04/14/16 1508   04/14/16 1508  For home use only DME Walker rolling  Once    Question:  Patient needs a walker to treat with the following condition  Answer:  S/P femoral-popliteal bypass surgery   04/14/16 1508     Verbal and written Discharge instructions given to the patient. Wound care per Discharge AVS Follow-up Information    Early, Todd, MD Follow up in 2 week(s).   Specialties:  Vascular Surgery, Cardiology Why:  Our office will call you to arrange an appointment in 2-3 weeks Contact information: [place] [address] [phone]        [name], MD Follow up in 2 week(s).   Specialty:  Orthopedic Surgery Why:  PLEASE CALL TO SCHEDULE Contact information: [place] [address] [phone]        [facility] Follow up.   Specialty:  [place] Why:  HHPT/OT arranged- they will call you to set up home visits Contact information: [address] [place] [address] [phone]        Inc[facility] - [facility] Follow up.   Why:  rolling walker and 3n1 arranged- to be delivered to room prior to discharge.  Contact information: [address] [place] [address] [phone]           Signed: Alvia Grove 04/14/2016, 4:04 PM - For VQI Registry use --- Instructions: Press F2 to tab through selections.  Delete question if not applicable.   Post-op:  Wound infection: No  Graft infection: No  Transfusion: No  If yes, 0 units given New Arrhythmia: No Ipsilateral amputation: [x ] no, [ ]  Minor, [ ]  BKA, [ ]  AKA Discharge patency: [ ]  Primary, [ ]  Primary assisted, [ ]  Secondary, [ ]  Occluded Patency judged by: [ ]  Dopper only, [x ] Palpable graft pulse, [ ]  Palpable distal pulse, [ ]  ABI inc. > 0.15, [ ]  Duplex  D/C Ambulatory Status: Ambulatory with assistance x 3  Complications: MI: [x ] No, [ ]  Troponin only, [ ]  EKG or Clinical CHF: No Resp failure: [x ] none, [ ]  Pneumonia, [ ]  Ventilator Chg in renal function: [x ] none, [ ]  Inc. Cr > 0.5, [ ]  Temp. Dialysis, [ ]  Permanent dialysis Stroke: [ x] None, [ ]  Minor, [ ]  Major Return to OR: No  Reason for return to OR: [ ]  Bleeding, [ ]  Infection, [ ]  Thrombosis, [ ]  Revision  Discharge medications: Statin use:  Yes ASA use:  No  for medical reason   Plavix use:  No  for medical reason   Beta blocker use: No  for medical reason   Coumadin use: No  for medical reason  Addendum  The patient remained inpatient over the weekend given lack of SNF availability. His mobility did improve and he was approved for home health services by physical therapy. Arrangements were made for home health services. He has a palpable posterior tibial subcuticular graft pulse and his incisions were clean. He was discharged home on 04/14/16 in good  condition.   Virgina Jock, PA-C

## 2016-04-11 NOTE — Progress Notes (Addendum)
[facility] [place]  Subjective  -  Doing well this am.   Objective (!) 147/72 91 98.8 F (37.1 C) (Oral) 18 94%  Intake/Output Summary (Last 24 hours) at 04/11/16 0734 Last data filed at 04/11/16 6144  Gross per 24 hour  Intake              480 ml  Output              810 ml  Net             -330 ml    Palpable by pass pulse left leg below the knee Foot dressing clean and dry from amputation by Dr. Sharol [name] 04/10/2016 Left groin soft incision healing well   Assessment/Planning: POD # 3 left femoral to posterior tibial bypass with in situ great saphenous vein Patent by pass  POD#1 amputation left great toe by Dr. Sharol [name]  Will have PT  Evaluate again today nursing staff states it was a 3 person return to bed transfer yesterday. Discharge planning pending  Matthew [name] [name] 04/11/2016 7:34 AM --  Laboratory Lab Results: No results for input(s): WBC, HGB, HCT, PLT in the last 72 hours. BMET No results for input(s): NA, K, CL, CO2, GLUCOSE, BUN, CREATININE, CALCIUM in the last 72 hours.  COAG Lab Results  Component Value Date   INR 1.02 04/02/2016   INR 0.89 02/07/2016   No results found for: PTT   Agree with the above.  I have seen and examined the patient.  All incisions look good/  Palpable bypass pulse Plan for d/c to SNF   Matthew [name]
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
## 2016-04-11 NOTE — Care Management Note (Addendum)
Case Management Note Previous CM note initiated by Matthew Mayo, RN-04/08/2016, 10:32 AM   Patient Details  Name: Matthew [name] MRN: [id number] Date of Birth: 1946/02/23  Subjective/Objective:   S/p l fem pop, he is to have surgery Wed or Friday for left great toe amputation, he lives with his wife, pta indep with a cane.  He is a English as a second language teacher, his PCP is Matthew [name] phone  [phone] ext [phone] fax number is [phone]  in [place], he states he gets his medications from [facility] and [facility].  At DC NCM will need to fax dc summary attention Dr. Rondell Reams.   Await pt/ot eval.  NCM will cont to follow for dc needs.                   Action/Plan: Pt tx from 3S to 2W- plan now is for pt to go to STSNF- CSW has been consulted for placement needs-   Expected Discharge Date:     04/12/16             Expected Discharge Plan:  [place]  In-House Referral:  Clinical Social Work  Discharge planning Services  CM Consult  Post Acute Care Choice:  Durable Medical Equipment, Home Health Choice offered to:  Patient  DME Arranged:    DME Agency:     HH Arranged:    [place] Agency:     Status of Service:  Completed, signed off  If discussed at [employer], dates discussed:    Discharge Disposition: skilled facility   Additional Comments:  04/11/16- 1000- Matthew Gibbons RN, CM- per staff family wants to look into [facility]SNF for pt- stating they are not able to care for pt in current condition- will have PT re-eval today- CSW to f/u with pt and family regarding STSNF option.  1500- d/c plan for SNF- per CSW family wants to use pt's medicare benefits- to d/c when bed available.  CM faxed d/c summary to PCP at the [facility] clinic- Dr. Herold [name] at [phone] via [place], [place], [place] 04/11/2016, 3:46 PM [phone]

## 2016-04-11 NOTE — Clinical Social Work Note (Signed)
Clinical Social Work Assessment  Patient Details  Name: Matthew [name] MRN: [id number] Date of Birth: [birth year]  Date of referral:  04/11/16               Reason for consult:  Discharge Planning                Permission sought to share information with:  Family Supports Permission granted to share information::     Name::     [name]  Agency::     Relationship::  Daughter  Contact Information:  [phone]  Housing/Transportation Living arrangements for the past 2 months:  Single Family Home Source of Information:  Adult Children, Spouse Patient Interpreter Needed:  None Criminal Activity/Legal Involvement Pertinent to Current Situation/Hospitalization:  No - Comment as needed Significant Relationships:  Adult Children, Spouse Lives with:  Spouse Do you feel safe going back to the place where you live?  Yes Need for family participation in patient care:  Yes (Comment)  Care giving concerns:  Patient stated that he lives at home with spouse and has adult children that are supportive   Social Worker assessment / plan:  [place] representative met with patient at bedside to offer support and discuss needs at discharge. Patient asked CSW to contact daughter [name]) and talk to her about a SNF placement. CSW spoke to patients daughter and wife via phone. [name] stated they are agreeable to SNF placement for patient and would prefer placement in [place]. CSW made family aware that because has VA benefits he will not be placed in [place] but wherever the [place] has a contract with a rehab facility. [name] stated that her patient has Medicare as a Consulting civil engineer. CSW contacted [facility] in [place] and spoke to [place]. Gerald Stabs stated that if family can verify they have Medicare the facility will assess them for placement. CSW also contacted [facility] for placement and is waiting for a phone call back from [place] worker. CSW will send over referral to [facility] and ave family speak  to [name] for placement. CSW to follow up with patient once bed offers are available. CSW remains available for support and to facilitate discharge needs once medically ready.  Employment status:  Retired Forensic scientist:  [employer], Other (Comment Required) (Family states that pateint has Medicare as secondary) PT Recommendations:  [place] / Referral to community resources:  [place]  Patient/Family's Response to care: Patient verbalized appreciation and understanding for CSW role and involvement in care. Patient agreeable with current discharge plan to SNF  Patient/Family's Understanding of and Emotional Response to Diagnosis, Current Treatment, and Prognosis:  Patient with good understanding of current medical state and limitations around most recent hospitalization. Patient is agreeable with SNF placement in hopes of transitioning back home.   Emotional Assessment Appearance:  Appears stated age Attitude/Demeanor/Rapport:  Other (Attitude/demeanor appropriate) Affect (typically observed):  Calm, Quiet, Pleasant Orientation:  Oriented to Situation, Oriented to Place, Oriented to  Time Alcohol / Substance use:    Psych involvement (Current and /or in the community):  No (Comment)  Discharge Needs  Concerns to be addressed:  No discharge needs identified Readmission within the last 30 days:  No Current discharge risk:  None Barriers to Discharge:  No Barriers Identified   Wende Neighbors, LCSW 04/11/2016, 1:18 PM
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
## 2016-04-11 NOTE — NC FL2 (Signed)
[place] LEVEL OF CARE SCREENING TOOL     IDENTIFICATION  Patient Name: Matthew [name] Birthdate: [birth year] Sex: male Admission Date (Current Location): 04/07/2016  [facility] and [place] Number:  Herbalist and Address:  [facility], [place] [address], [place], [place] [address]      Provider Number: [id number]  Attending Physician Name and Address:  [name], MD  Relative Name and Phone Number:  Wiliam Cauthorn, [phone]    Current Level of Care: SNF Recommended Level of Care: [place] Prior Approval Number:    Date Approved/Denied:   PASRR Number: [id number]  Discharge Plan: SNF    Current Diagnoses: Patient Active Problem List   Diagnosis Date Noted  . 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 Osteomyelitis of toe of left foot ([place]) 11/16/2015  . HTN (hypertension) 11/16/2015  . Diabetes mellitus, type 2 ([place]) 11/16/2015  . CKD (chronic kidney disease), stage III 11/16/2015  . Hyperlipidemia 11/16/2015    Orientation RESPIRATION BLADDER Height & Weight     Self, Time, Situation, Place  Normal Continent Weight: 192 lb 7.4 oz (87.3 kg) Height:  5\' 8"  (172.7 cm)  BEHAVIORAL SYMPTOMS/MOOD NEUROLOGICAL BOWEL NUTRITION STATUS      Continent Diet (carb modified/thin fluid)  AMBULATORY STATUS COMMUNICATION OF NEEDS Skin   Extensive Assist Verbally Normal                       Personal Care Assistance Level of Assistance  Bathing, Feeding, Dressing Bathing Assistance: Limited assistance Feeding assistance: Independent Dressing Assistance: Limited assistance     Functional Limitations Info  Sight, Speech, Hearing Sight Info: Adequate Hearing Info: Adequate Speech Info: Adequate    SPECIAL CARE FACTORS FREQUENCY  PT (By licensed PT), OT (By licensed OT)     PT Frequency: 5x week OT Frequency: 5x week             Contractures Contractures Info: Not present    Additional Factors Info  Code Status Code Status Info: Full Code             Current Medications (04/11/2016):  This is the current hospital active medication list Current Facility-Administered Medications  Medication Dose Route Frequency Provider Last Rate Last Dose  . 0.9 %  sodium chloride infusion  500 mL Intravenous Once PRN Alvia Grove, PA-C      . 0.9 %  sodium chloride infusion   Intravenous Continuous [name], MD      . acetaminophen (TYLENOL) tablet 650 mg  650 mg Oral Q6H PRN [name], MD   650 mg at 04/11/16 0445   Or  . acetaminophen (TYLENOL) suppository 650 mg  650 mg Rectal Q6H PRN [name], MD      . alum & mag hydroxide-simeth (MAALOX/MYLANTA) 200-200-20 MG/5ML suspension 15-30 mL  15-30 mL Oral Q2H PRN Alvia Grove, PA-C      . amLODipine (NORVASC) tablet 10 mg  10 mg Oral Daily Alvia Grove, PA-C   10 mg at 04/11/16 0926  . atorvastatin (LIPITOR) tablet 20 mg  20 mg Oral QHS Alvia Grove, PA-C   20 mg at 04/10/16 2202  . docusate sodium (COLACE) capsule 100 mg  100 mg Oral Daily Alvia Grove, PA-C   100 mg at 04/11/16 2836  . enoxaparin (LOVENOX) injection 40  mg  40 mg Subcutaneous Q24H Alvia Grove, PA-C   40 mg at 04/11/16 0926  . ferrous sulfate tablet 325 mg  325 mg Oral BID WC Alvia Grove, PA-C   325 mg at 04/11/16 0731  . gabapentin (NEURONTIN) tablet 600 mg  600 mg Oral TID Alvia Grove, PA-C   600 mg at 04/11/16 0926  . guaiFENesin-dextromethorphan (ROBITUSSIN DM) 100-10 MG/5ML syrup 15 mL  15 mL Oral Q4H PRN Alvia Grove, PA-C      . hydrALAZINE (APRESOLINE) injection 5 mg  5 mg Intravenous Q20 Min PRN Alvia Grove, PA-C      . lactated ringers infusion   Intravenous Continuous Albertha Ghee, MD 10 mL/hr at 04/09/16 1042    . latanoprost (XALATAN) 0.005 % ophthalmic solution 1 drop  1 drop Both Eyes QHS Alvia Grove, PA-C   1 drop at 04/10/16 2202   . magnesium sulfate IVPB 2 g 50 mL  2 g Intravenous Daily PRN Alvia Grove, PA-C      . methocarbamol (ROBAXIN) tablet 500 mg  500 mg Oral Q6H PRN [name], MD       Or  . methocarbamol (ROBAXIN) 500 mg in dextrose 5 % 50 mL IVPB  500 mg Intravenous Q6H PRN [name], MD      . methocarbamol (ROBAXIN) tablet 750 mg  750 mg Oral QHS Alvia Grove, PA-C   750 mg at 04/10/16 2202  . metoCLOPramide (REGLAN) tablet 5-10 mg  5-10 mg Oral Q8H PRN [name], MD       Or  . metoCLOPramide (REGLAN) injection 5-10 mg  5-10 mg Intravenous Q8H PRN [name], MD      . metoprolol (LOPRESSOR) injection 2-5 mg  2-5 mg Intravenous Q2H PRN Alvia Grove, PA-C      . morphine 4 MG/ML injection 3 mg  3 mg Intravenous Q1H PRN Alvia Grove, PA-C   3 mg at 04/09/16 1932  . omega-3 acid ethyl esters (LOVAZA) capsule 1 g  1 g Oral BID Alvia Grove, PA-C   1 g at 04/11/16 2229  . ondansetron (ZOFRAN) tablet 4 mg  4 mg Oral Q6H PRN [name], MD       Or  . ondansetron [facility]) injection 4 mg  4 mg Intravenous Q6H PRN [name], MD      . oxyCODONE-acetaminophen (PERCOCET/ROXICET) 5-325 MG per tablet 1 tablet  1 tablet Oral Q4H PRN [name], MD   1 tablet at 04/11/16 1158   And  . oxyCODONE (Oxy IR/ROXICODONE) immediate release tablet 5 mg  5 mg Oral Q4H PRN [name], MD   5 mg at 04/11/16 0926  . pantoprazole (PROTONIX) EC tablet 40 mg  40 mg Oral Daily Alvia Grove, PA-C   40 mg at 04/11/16 0926  . phenol (CHLORASEPTIC) mouth spray 1 spray  1 spray Mouth/Throat PRN Alvia Grove, PA-C      . potassium chloride SA (K-DUR,KLOR-CON) CR tablet 20-40 mEq  20-40 mEq Oral Daily PRN Alvia Grove, PA-C         Discharge Medications: Please see discharge summary for a list of discharge medications.  Relevant Imaging Results:  Relevant Lab Results:   Additional Information SS# [id number] (has medicare as a secondary insurance )  Wende Neighbors,  LCSW

## 2016-04-12 MED ORDER — POLYETHYLENE GLYCOL 3350 17 G PO PACK
17.0000 g | PACK | Freq: Every day | ORAL | Status: DC
  Administered 2016-04-12 – 2016-04-13 (×2): 17 g via ORAL
  Filled 2016-04-12 (×2): qty 1

## 2016-04-12 NOTE — Progress Notes (Addendum)
[facility] [place]  Subjective  - Sitting up eating breakfast.   Objective 116/61 80 98.6 F (37 C) (Oral) 18 94%  Intake/Output Summary (Last 24 hours) at 04/12/16 0756 Last data filed at 04/12/16 0600  Gross per 24 hour  Intake           114.17 ml  Output             1200 ml  Net         -1085.83 ml    Palpable by pass graft Left foot dressing in place Heart RRR Lungs non labored breathing  Assessment/Planning: POD # 4 left femoral to posterior tibial bypass with in situ great saphenous vein Patent by pass  POD#3 amputation left great toe by Dr. Sharol Given  Pending SNF F/U appt. Made office will call  Laurence Slate [facility] 04/12/2016 7:56 AM --  Laboratory Lab Results: No results for input(s): WBC, HGB, HCT, PLT in the last 72 hours. BMET No results for input(s): NA, K, CL, CO2, GLUCOSE, BUN, CREATININE, CALCIUM in the last 72 hours.  COAG Lab Results  Component Value Date   INR 1.02 04/02/2016   INR 0.89 02/07/2016   No results found for: PTT   I agree with the above.  Plan for d/c to SNF when bed available   Wells Brabham

## 2016-04-13 NOTE — Progress Notes (Signed)
    Subjective  - POD #5  Feels he is much stronger today and wants to go home as opposed to SNF   Physical Exam:  Palpable graft pulse Incisions ok  Foot dressing stable     Assessment/Plan:    Will have PT work with him tomorrow to see if he is safe for d/c home as opposed to SNF  Briony Parveen, [place] 04/13/2016 1:02 PM --  Vitals:   04/12/16 2112 04/13/16 0500  BP: 124/63 139/66  Pulse: 85 73  Resp: 18 18  Temp: 99.5 F (37.5 C) 98.9 F (37.2 C)    Intake/Output Summary (Last 24 hours) at 04/13/16 1302 Last data filed at 04/13/16 0630  Gross per 24 hour  Intake              360 ml  Output              675 ml  Net             -315 ml     Laboratory CBC    Component Value Date/Time   WBC 6.5 04/08/2016 0528   HGB 10.2 (L) 04/08/2016 0528   HCT 30.6 (L) 04/08/2016 0528   PLT 197 04/08/2016 0528    BMET    Component Value Date/Time   NA 138 04/08/2016 0528   K 4.1 04/08/2016 0528   CL 106 04/08/2016 0528   CO2 24 04/08/2016 0528   GLUCOSE 87 04/08/2016 0528   BUN 8 04/08/2016 0528   CREATININE 1.64 (H) 04/08/2016 0528   CALCIUM 8.2 (L) 04/08/2016 0528   GFRNONAA 40 (L) 04/08/2016 0528   GFRAA 47 (L) 04/08/2016 0528    COAG Lab Results  Component Value Date   INR 1.02 04/02/2016   INR 0.89 02/07/2016   No results found for: PTT  Antibiotics Anti-infectives    Start     Dose/Rate Route Frequency Ordered Stop   04/09/16 1700  ceFAZolin (ANCEF) IVPB 1 g/50 mL premix     1 g 100 mL/hr over 30 Minutes Intravenous Every 6 hours 04/09/16 1300 04/10/16 0732   04/07/16 2000  cefUROXime (ZINACEF) 1.5 g in dextrose 5 % 50 mL IVPB     1.5 g 100 mL/hr over 30 Minutes Intravenous Every 12 hours 04/07/16 1517 04/08/16 0943   04/07/16 0600  cefUROXime (ZINACEF) 1.5 g in dextrose 5 % 50 mL IVPB     1.5 g 100 mL/hr over 30 Minutes Intravenous 30 min pre-op 04/06/16 1430 04/07/16 0804       V. Leia Alf, M.D. [facility]  [place] Office: [phone] Pager:  [phone]

## 2016-04-14 LAB — CREATININE, SERUM
Creatinine, Ser: 1.76 mg/dL — ABNORMAL HIGH (ref 0.61–1.24)
GFR, EST AFRICAN AMERICAN: 43 mL/min — AB (ref >60)
GFR, EST NON AFRICAN AMERICAN: 37 mL/min — AB (ref >60)

## 2016-04-14 MED ORDER — HEPARIN SODIUM (PORCINE) 5000 UNIT/ML IJ SOLN
5000.0000 [IU] | Freq: Three times a day (TID) | INTRAMUSCULAR | Status: DC
  Administered 2016-04-14: 5000 [IU] via SUBCUTANEOUS
  Filled 2016-04-14: qty 1

## 2016-04-14 MED ORDER — OXYCODONE-ACETAMINOPHEN 10-325 MG PO TABS: 1.0000 | ORAL_TABLET | Freq: Three times a day (TID) | ORAL | 0 refills | Status: DC

## 2016-04-14 NOTE — Progress Notes (Signed)
Patient states that Dr, Donnetta Hutching  Says he can stay until am  [place] notified.

## 2016-04-14 NOTE — Progress Notes (Signed)
Discharge papers given with understanding of instructions. RX given  For pain

## 2016-04-14 NOTE — Progress Notes (Addendum)
  Progress Note    04/14/2016 7:37 AM 5 Days Post-Op  Subjective:  No complaints  Tm 19.6 222 systolic HR 97'L-89'Q NSR 98% RA  Vitals:   04/13/16 0500 04/13/16 1305  BP: 139/66 139/72  Pulse: 73 84  Resp: 18 17  Temp: 98.9 F (37.2 C) 99.1 F (37.3 C)    Physical Exam: Cardiac:  regular Lungs:  Non labored Incisions:  All are healing nicely Extremities:  Excellent doppler signal within graft pulse; left foot is wrapped and clean  CBC    Component Value Date/Time   WBC 6.5 04/08/2016 0528   RBC 3.46 (L) 04/08/2016 0528   HGB 10.2 (L) 04/08/2016 0528   HCT 30.6 (L) 04/08/2016 0528   PLT 197 04/08/2016 0528   MCV 88.4 04/08/2016 0528   MCH 29.5 04/08/2016 0528   MCHC 33.3 04/08/2016 0528   RDW 12.7 04/08/2016 0528   LYMPHSABS 2.1 11/16/2015 1135   MONOABS 0.7 11/16/2015 1135   EOSABS 0.3 11/16/2015 1135   BASOSABS 0.1 11/16/2015 1135    BMET    Component Value Date/Time   NA 138 04/08/2016 0528   K 4.1 04/08/2016 0528   CL 106 04/08/2016 0528   CO2 24 04/08/2016 0528   GLUCOSE 87 04/08/2016 0528   BUN 8 04/08/2016 0528   CREATININE 1.76 (H) 04/14/2016 0442   CALCIUM 8.2 (L) 04/08/2016 0528   GFRNONAA 37 (L) 04/14/2016 0442   GFRAA 43 (L) 04/14/2016 0442    INR    Component Value Date/Time   INR 1.02 04/02/2016 1544     Intake/Output Summary (Last 24 hours) at 04/14/16 0737 Last data filed at 04/13/16 1755  Gross per 24 hour  Intake              960 ml  Output             1000 ml  Net              -40 ml     Assessment:  70 y.o. male is s/p:   Left femoral to posterior tibial bypass with in situ great saphenous vein  And Left Great Toe Amputation at MTP Joint 5 Days Post-Op  Plan: -pt doing well this am with excellent doppler flow within the graft. -left foot is wrapped and clean (dressing per ortho) -wants to go home, PT will work with pt today to make sure it is safe for discharge -discussed groin wound care with pt and the  importance of keeping groin clean and dry to help prevent infection.  He expressed understanding -creatinine up slightly, but about the same as on admission-change Lovenox to SQ heparin   Leontine Locket, PA-C [facility] [phone] 04/14/2016 7:37 AM  I have examined the patient, reviewed and agree with above. Comfortable. Excellent posterior tibial subcuticular graft pulse. PT feels safe for discharge from a mobility standpoint. Okay for discharge to home. Patient states may not be able to arrange transfer to home today. Will make discharge arrangements and either this afternoon or in the morning  Curt Jews, MD 04/14/2016 2:42 PM

## 2016-04-14 NOTE — Care Management Note (Addendum)
Case Management Note Previous CM note initiated by Zenon Mayo, RN-04/08/2016, 10:32 AM   Patient Details  Name: Matthew [name] MRN: [id number] Date of Birth: [birth year]  Subjective/Objective:   S/p l fem pop, he is to have surgery Wed or Friday for left great toe amputation, he lives with his wife, pta indep with a cane.  He is a English as a second language teacher, his PCP is Herold Harms phone  [phone] ext [phone] fax number is [phone]  in [place], he states he gets his medications from [facility] and [place].  At DC NCM will need to fax dc summary attention Dr. Rondell Reams.   Await pt/ot eval.  NCM will cont to follow for dc needs.                   Action/Plan: Pt tx from 3S to 2W- plan now is for pt to go to STSNF- CSW has been consulted for placement needs-   Expected Discharge Date:     04/12/16             Expected Discharge Plan:  [place]  In-House Referral:  Clinical Social Work  Discharge planning Services  CM Consult  Post Acute Care Choice:  Durable Medical Equipment, Home Health Choice offered to:  Patient, Adult Children  DME Arranged:  3-N-1, Walker rolling DME Agency:  [place] Arranged:  OT, PT [facility] Agency:  [place]  Status of Service:  Completed, signed off  If discussed at [place] of Stay Meetings, dates discussed:    Discharge Disposition: skilled facility   Additional Comments:  04/14/16- [place], CM- notified by Tiffany with [facility]- they can not take referral due to being out of network with pt's insurance- [facility] Medicare- per conversation with pt will use [facility] for services- referral called to Santiago Glad with [facility] and accepted- for HHPT/OT-    04/14/16- 1500- Leolia Vinzant RN, CM- pt much improved today with mobility- per PT safe to return home with family and [place] services- have spoken with pt and daughter who agree pt to return home with [facility]. They would like to use pt's Medicare benefits for [facility]  services- choice offered and pt would like to use [facility] for services but ok with using [facility] per vascular surgery working relationship with [facility] who received a pre-op referral. Pt will also need RW and 3n1 for discharge- spoke with PA-Samantha R. Regarding d/c - pt to d/c later today home -per daughter- wife and brother to come pick pt up- orders to be placed for HHPT/OT and DME- call made to [place] with [facility] who will verify pt's insurance- also made call to [facility] with [facility] for DEM needs- RW and 3n1 to be delivered to room prior to discharge. CM will fax d/c summary to pt's PCP at the [facility] clinic.   04/11/16- 1000- Dajae Kizer RN, CM- per staff family wants to look into STSNF for pt- stating they are not able to care for pt in current condition- will have PT re-eval today- CSW to f/u with pt and family regarding STSNF option.  1500- d/c plan for SNF- per CSW family wants to use pt's medicare benefits- to d/c when bed available.  CM faxed d/c summary to PCP at the [facility] clinic- Dr. Herold Harms at [phone] via [place], [place], [facility] 04/14/2016, 3:44 PM [phone]

## 2016-04-14 NOTE — Progress Notes (Signed)
Per conversation with case manager [place], Patient will be discharging home and family will be picking patient up from [facility] between the hours of 4-5pm. CSW no longer needed at this time.  Rhea Pink, MSW,  [facility]
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
## 2016-04-14 NOTE — Progress Notes (Signed)
Patient will be going home today with [facility] equipment . Family will pick  Him up around 4:30 or 5:00 pm

## 2016-04-14 NOTE — Progress Notes (Signed)
Orthopedic Tech Progress Note Patient Details:  Matthew [name] April 22, 1946 [id number]  Ortho Devices Type of Ortho Device: Darco shoe Ortho Device/Splint Location: lle Ortho Device/Splint Interventions: Application   [name] 04/14/2016, 9:39 AM

## 2016-04-16 DIAGNOSIS — I70245 Atherosclerosis of native arteries of left leg with ulceration of other part of foot: Secondary | ICD-10-CM | POA: Diagnosis not present

## 2016-04-16 DIAGNOSIS — R7303 Prediabetes: Secondary | ICD-10-CM | POA: Diagnosis not present

## 2016-04-16 DIAGNOSIS — I739 Peripheral vascular disease, unspecified: Secondary | ICD-10-CM | POA: Diagnosis not present

## 2016-04-16 DIAGNOSIS — I350 Nonrheumatic aortic (valve) stenosis: Secondary | ICD-10-CM | POA: Diagnosis not present

## 2016-04-16 DIAGNOSIS — Z89412 Acquired absence of left great toe: Secondary | ICD-10-CM | POA: Diagnosis not present

## 2016-04-16 DIAGNOSIS — N183 Chronic kidney disease, stage 3 (moderate): Secondary | ICD-10-CM | POA: Diagnosis not present

## 2016-04-16 DIAGNOSIS — E785 Hyperlipidemia, unspecified: Secondary | ICD-10-CM | POA: Diagnosis not present

## 2016-04-16 DIAGNOSIS — Z4781 Encounter for orthopedic aftercare following surgical amputation: Secondary | ICD-10-CM | POA: Diagnosis not present

## 2016-04-16 DIAGNOSIS — I129 Hypertensive chronic kidney disease with stage 1 through stage 4 chronic kidney disease, or unspecified chronic kidney disease: Secondary | ICD-10-CM | POA: Diagnosis not present

## 2016-04-22 ENCOUNTER — Encounter (INDEPENDENT_AMBULATORY_CARE_PROVIDER_SITE_OTHER): Payer: Self-pay | Admitting: Orthopedic Surgery

## 2016-04-22 ENCOUNTER — Ambulatory Visit (INDEPENDENT_AMBULATORY_CARE_PROVIDER_SITE_OTHER): Payer: Medicare Other | Admitting: Orthopedic Surgery

## 2016-04-22 VITALS — Ht 68.0 in | Wt 192.0 lb

## 2016-04-22 DIAGNOSIS — Z89412 Acquired absence of left great toe: Secondary | ICD-10-CM

## 2016-04-22 DIAGNOSIS — IMO0002 Reserved for concepts with insufficient information to code with codable children: Secondary | ICD-10-CM
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
## 2016-04-24 ENCOUNTER — Encounter: Payer: Self-pay | Admitting: Vascular Surgery

## 2016-04-24 ENCOUNTER — Telehealth: Payer: Self-pay

## 2016-04-24 NOTE — Telephone Encounter (Signed)
Phone call from pt. And his wife, with request for refill of his pain medication; Oxycodone/ Aceta. 10/325 mg.  Reported he has about 6-7 pills left.  Reported he is taking 1 tab. q 4 hrs.  For left LE incisional pain and top of left foot; rated left leg incisional pain at 7/10.  Stated there is no redness or drainage of the incisional areas.  Denied any swelling of left LE.  Reported he rec'd above medication from the [place] last month, and has contacted them for refill; was advised he is not able to get further refill from the [place] until 06/01/16.  Hosp. f/u appt. with Dr. Donnetta Hutching is sched.. 05/06/16.  Advised will discuss with Dr. Donnetta Hutching and call back with recommendations.

## 2016-04-24 NOTE — Telephone Encounter (Signed)
Reviewed [facility] for prescription hx.  The pt. has been receiving Oxycodone/Acetaminophen 10/325, # 90, from the [place], every month.  Dr. Donnetta Hutching made aware, and [facility].  Phone call to pt.; spoke with pt's wife.  Informed of the above recommendation.  Verb. Understanding.

## 2016-05-06 ENCOUNTER — Ambulatory Visit (INDEPENDENT_AMBULATORY_CARE_PROVIDER_SITE_OTHER): Payer: Self-pay | Admitting: Vascular Surgery

## 2016-05-06 ENCOUNTER — Encounter: Payer: Self-pay | Admitting: Vascular Surgery

## 2016-05-06 VITALS — BP 132/88 | HR 88 | Temp 98.2°F | Resp 16 | Ht 68.0 in | Wt 194.0 lb

## 2016-05-06 DIAGNOSIS — I70245 Atherosclerosis of native arteries of left leg with ulceration of other part of foot: Secondary | ICD-10-CM

## 2016-05-06 NOTE — Progress Notes (Signed)
   Patient name: Matthew [name] MRN: [id number] DOB: 1946-01-11 Sex: male  REASON FOR VISIT: She presents today for follow-up of his left femoral to posterior tibial bypass with in situ saphenous vein. He also underwent left great toe amputation with Dr. Sharol [name].   HPI: Matthew [name] is a 71 y.o. male here today for follow-up. Looks quite good today. He is here today with his wife. He is walking with a Darco shoe and reports his soreness is improving. He does have a moderate left leg swelling  Current Outpatient Prescriptions  Medication Sig Dispense Refill  . amLODipine (NORVASC) 10 MG tablet Take 10 mg by mouth daily. For heart    . atorvastatin (LIPITOR) 20 MG tablet Take 20 mg by mouth at bedtime. For cholesterol    . ferrous sulfate 325 (65 FE) MG tablet Take 325 mg by mouth 2 (two) times daily with a meal.    . gabapentin (NEURONTIN) 600 MG tablet Take 600 mg by mouth 3 (three) times daily. For peripheral neuropathy     . HYDROcodone-acetaminophen (NORCO/VICODIN) 5-325 MG tablet Take 1-2 tablets by mouth every 4 (four) hours as needed for moderate pain. 20 tablet 0  . latanoprost (XALATAN) 0.005 % ophthalmic solution Place 1 drop into both eyes at bedtime.    . methocarbamol (ROBAXIN) 750 MG tablet Take 750 mg by mouth at bedtime.    . Omega-3 Fatty Acids (FISH OIL) 1000 MG CAPS Take 1,000 mg by mouth 2 (two) times daily.    [place] Kitchen oxyCODONE-acetaminophen (PERCOCET) 10-325 MG tablet Take 1 tablet by mouth 3 (three) times daily. 30 tablet 0  . polymixin-bacitracin (POLYSPORIN) 500-10000 UNIT/GM OINT ointment Apply 1 application topically 2 (two) times daily. Apply to big toe left foot    . sulfamethoxazole-trimethoprim (BACTRIM DS,SEPTRA DS) 800-160 MG tablet Take 1 tablet by mouth 2 (two) times daily. 14 tablet 0   No current facility-administered medications for this visit.      PHYSICAL EXAM: Vitals:   05/06/16 1125  BP: 132/88  Pulse: 88  Resp: 16    Temp: 98.2 F (36.8 C)  TempSrc: Oral  SpO2: 98%  Weight: 194 lb (88 kg)  Height: 5\' 8"  (1.727 m)    GENERAL: The patient is a well-nourished male, in no acute distress. The vital signs are documented above. All surgical incisions on his left leg are healing quite nicely. Does have a well-perfused foot with moderate pitting edema. Has excellent posterior tibial signal with Doppler and also dorsalis pedis with Doppler  MEDICAL ISSUES: Stable overall. Will see Dr. Sharol [name] later this week for removal of sutures. His toe amputation appears to have excellent healing as well. He will continue to increase his activities as tolerated. We will see him again in 3 months with graft scan and ankle arm index.   [name], MD FACS [facility] Tel [phone] Pager [phone]

## 2016-05-07 ENCOUNTER — Ambulatory Visit (INDEPENDENT_AMBULATORY_CARE_PROVIDER_SITE_OTHER): Payer: Medicare Other | Admitting: Orthopedic Surgery

## 2016-05-07 ENCOUNTER — Encounter (INDEPENDENT_AMBULATORY_CARE_PROVIDER_SITE_OTHER): Payer: Self-pay

## 2016-05-07 ENCOUNTER — Encounter (INDEPENDENT_AMBULATORY_CARE_PROVIDER_SITE_OTHER): Payer: Self-pay | Admitting: Orthopedic Surgery

## 2016-05-07 VITALS — Ht 68.0 in | Wt 192.0 lb

## 2016-05-07 DIAGNOSIS — IMO0002 Reserved for concepts with insufficient information to code with codable children: Secondary | ICD-10-CM

## 2016-05-07 DIAGNOSIS — Z89412 Acquired absence of left great toe: Secondary | ICD-10-CM
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
## 2016-05-07 NOTE — Addendum Note (Signed)
Addended by: Lianne Cure A on: 05/07/2016 09:21 AM   Modules accepted: Orders

## 2016-06-05 ENCOUNTER — Ambulatory Visit (INDEPENDENT_AMBULATORY_CARE_PROVIDER_SITE_OTHER): Payer: Medicare Other | Admitting: Family

## 2016-06-05 ENCOUNTER — Encounter (INDEPENDENT_AMBULATORY_CARE_PROVIDER_SITE_OTHER): Payer: Self-pay

## 2016-06-05 DIAGNOSIS — IMO0002 Reserved for concepts with insufficient information to code with codable children: Secondary | ICD-10-CM

## 2016-06-05 DIAGNOSIS — Z89412 Acquired absence of left great toe: Secondary | ICD-10-CM
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
## 2016-07-30 ENCOUNTER — Encounter: Payer: Self-pay | Admitting: Vascular Surgery

## 2016-08-12 ENCOUNTER — Ambulatory Visit (INDEPENDENT_AMBULATORY_CARE_PROVIDER_SITE_OTHER): Payer: Non-veteran care | Admitting: Vascular Surgery

## 2016-08-12 ENCOUNTER — Encounter: Payer: Self-pay | Admitting: Vascular Surgery

## 2016-08-12 ENCOUNTER — Ambulatory Visit (HOSPITAL_COMMUNITY)
Admission: RE | Admit: 2016-08-12 | Discharge: 2016-08-12 | Disposition: A | Discharge status: Home / Self Care | Payer: Non-veteran care | Source: Ambulatory Visit | Attending: Vascular Surgery | Admitting: Vascular Surgery

## 2016-08-12 ENCOUNTER — Ambulatory Visit (INDEPENDENT_AMBULATORY_CARE_PROVIDER_SITE_OTHER)
Admission: RE | Admit: 2016-08-12 | Discharge: 2016-08-12 | Disposition: A | Discharge status: Home / Self Care | Payer: Non-veteran care | Source: Ambulatory Visit | Attending: Vascular Surgery | Admitting: Vascular Surgery

## 2016-08-12 VITALS — BP 159/93 | HR 78 | Temp 98.0°F | Resp 18 | Ht 68.0 in | Wt 195.0 lb

## 2016-08-12 DIAGNOSIS — Z89412 Acquired absence of left great toe: Secondary | ICD-10-CM | POA: Insufficient documentation

## 2016-08-12 DIAGNOSIS — I70201 Unspecified atherosclerosis of native arteries of extremities, right leg: Secondary | ICD-10-CM | POA: Insufficient documentation

## 2016-08-12 DIAGNOSIS — Z95828 Presence of other vascular implants and grafts: Secondary | ICD-10-CM | POA: Insufficient documentation

## 2016-08-12 DIAGNOSIS — I70245 Atherosclerosis of native arteries of left leg with ulceration of other part of foot: Secondary | ICD-10-CM | POA: Diagnosis present

## 2016-08-12 NOTE — Progress Notes (Signed)
Vascular and Vein Specialist of [facility]  Patient name: Matthew [name] MRN: [id number] DOB: April 20, 1946 Sex: male  REASON FOR VISIT: Follow-up left femoral to posterior tibial bypass with in situ saphenous vein on 04/07/2016  HPI: Matthew [name] is a 71 y.o. male here for follow-up. 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 He underwent bypass and amputation of his toe by Dr. Sharol Given. He is now had complete healing of his toe amputation and is having no claudication symptoms. He does have mild to moderate swelling at his distal ankle into his foot.  Past Medical History:  Diagnosis Date  . Aortic stenosis    moderate-severe AS 01/15/16 echo ([facility])  . Arthritis    "all over" (04/07/2016)  . Chronic kidney disease (CKD), stage III (moderate)    dx from [place] in [place], [place]  . Heart murmur   . High cholesterol   . Hyperlipidemia   . Hypertension   . Neuropathy (HCC)    FEET AND HANDS  . Peripheral vascular disease ([place])   . Post traumatic stress disorder (PTSD)   . Pre-diabetes     Family History  Problem Relation Age of Onset  . Leukemia Sister   . Lung cancer Brother   . Heart failure Mother   . Diabetes Father     SOCIAL HISTORY: Social History  Substance Use Topics  . Smoking status: Current Every Day Smoker    Packs/day: 1.00    Types: Cigarettes  . Smokeless tobacco: Never Used  . Alcohol use No    Allergies  Allergen Reactions  . No Known Allergies     Current Outpatient Prescriptions  Medication Sig Dispense Refill  . amLODipine (NORVASC) 10 MG tablet Take 10 mg by mouth daily. For heart    . atorvastatin (LIPITOR) 20 MG tablet Take 20 mg by mouth at bedtime. For cholesterol    . ferrous sulfate 325 (65 FE) MG tablet Take 325 mg by mouth 2 (two) times daily with a meal.    . gabapentin (NEURONTIN) 600 MG tablet Take 600 mg by mouth 3 (three) times daily. For peripheral  neuropathy     . HYDROcodone-acetaminophen (NORCO/VICODIN) 5-325 MG tablet Take 1-2 tablets by mouth every 4 (four) hours as needed for moderate pain. (Patient not taking: Reported on 05/07/2016) 20 tablet 0  . latanoprost (XALATAN) 0.005 % ophthalmic solution Place 1 drop into both eyes at bedtime.    . methocarbamol (ROBAXIN) 750 MG tablet Take 750 mg by mouth at bedtime.    . Omega-3 Fatty Acids (FISH OIL) 1000 MG CAPS Take 1,000 mg by mouth 2 (two) times daily.    Marland Kitchen oxyCODONE-acetaminophen (PERCOCET) 10-325 MG tablet Take 1 tablet by mouth 3 (three) times daily. 30 tablet 0  . polymixin-bacitracin (POLYSPORIN) 500-10000 UNIT/GM OINT ointment Apply 1 application topically 2 (two) times daily. Apply to big toe left foot    . sulfamethoxazole-trimethoprim (BACTRIM DS,SEPTRA DS) 800-160 MG tablet Take 1 tablet by mouth 2 (two) times daily. (Patient not taking: Reported on 05/07/2016) 14 tablet 0   No current facility-administered medications for this visit.     REVIEW OF SYSTEMS:  [X]  denotes positive finding, [ ]  denotes negative finding Cardiac  Comments:  Chest pain or chest pressure:    Shortness of breath upon exertion:    Short of breath when lying flat:    Irregular heart rhythm:        Vascular    Pain  in calf, thigh, or hip brought on by ambulation:    Pain in feet at night that wakes you up from your sleep:     Blood clot in your veins:    Leg swelling:  x         PHYSICAL EXAM: Vitals:   08/12/16 1204 08/12/16 1205  BP: (!) 160/89 (!) 159/93  Pulse: 78   Resp: 18   Temp: 98 F (36.7 C)   SpO2: 97%   Weight: 195 lb (88.5 kg)   Height: 5\' 8"  (1.727 m)     GENERAL: The patient is a well-nourished male, in no acute distress. The vital signs are documented above. CARDIOVASCULAR: Easily palpable in situ graft pulse in his left leg PULMONARY: There is good air exchange  MUSCULOSKELETAL: There are no major deformities or cyanosis. NEUROLOGIC: No focal weakness or  paresthesias are detected. SKIN: There are no ulcers or rashes noted. PSYCHIATRIC: The patient has a normal affect.  DATA:  Noninvasive studies reveal ankle arm index of 0.98 on the left up from 0.4 preoperatively. His duplex reveals no evidence of stenosis throughout its course  MEDICAL ISSUES: Stable status post left femorofemoral posterior tibial bypass for limb salvage. We'll continue his walking program. We will see him again in 6 months with repeat ankle arm index and graft scan. Explained that we would plan lifelong surveillance of his bypass. Also explained the symptoms associated with graft occlusion he would notify us immediately    [name], MD [facility] Vascular and Vein Specialists of [facility] Tel [phone] Pager [phone]

## 2016-08-13 NOTE — Addendum Note (Signed)
Addended by: Lianne Cure A on: 08/13/2016 09:27 AM   Modules accepted: Orders

## 2017-02-18 ENCOUNTER — Encounter (HOSPITAL_COMMUNITY): Payer: Medicare Other

## 2017-02-18 ENCOUNTER — Ambulatory Visit: Payer: Medicare Other | Admitting: Family

## 2017-04-16 ENCOUNTER — Ambulatory Visit (INDEPENDENT_AMBULATORY_CARE_PROVIDER_SITE_OTHER): Payer: Non-veteran care | Admitting: Family

## 2017-04-16 ENCOUNTER — Ambulatory Visit (HOSPITAL_COMMUNITY)
Admission: RE | Admit: 2017-04-16 | Discharge: 2017-04-16 | Disposition: A | Discharge status: Home / Self Care | Payer: Non-veteran care | Source: Ambulatory Visit | Attending: Vascular Surgery | Admitting: Vascular Surgery

## 2017-04-16 ENCOUNTER — Ambulatory Visit (INDEPENDENT_AMBULATORY_CARE_PROVIDER_SITE_OTHER)
Admission: RE | Admit: 2017-04-16 | Discharge: 2017-04-16 | Disposition: A | Discharge status: Home / Self Care | Payer: Non-veteran care | Source: Ambulatory Visit | Attending: Vascular Surgery | Admitting: Vascular Surgery

## 2017-04-16 ENCOUNTER — Encounter: Payer: Self-pay | Admitting: Family

## 2017-04-16 VITALS — BP 121/73 | HR 65 | Temp 97.0°F | Resp 18 | Wt 208.1 lb

## 2017-04-16 DIAGNOSIS — F17200 Nicotine dependence, unspecified, uncomplicated: Secondary | ICD-10-CM

## 2017-04-16 DIAGNOSIS — I779 Disorder of arteries and arterioles, unspecified: Secondary | ICD-10-CM

## 2017-04-16 DIAGNOSIS — I70245 Atherosclerosis of native arteries of left leg with ulceration of other part of foot: Secondary | ICD-10-CM | POA: Diagnosis not present

## 2017-04-16 NOTE — Patient Instructions (Signed)
Steps to Quit Smoking Smoking tobacco can be bad for your health. It can also affect almost every organ in your body. Smoking puts you and people around you at risk for many serious long-lasting (chronic) diseases. Quitting smoking is hard, but it is one of the best things that you can do for your health. It is never too late to quit. What are the benefits of quitting smoking? When you quit smoking, you lower your risk for getting serious diseases and conditions. They can include:  Lung cancer or lung disease.  Heart disease.  Stroke.  Heart attack.  Not being able to have children (infertility).  Weak bones (osteoporosis) and broken bones (fractures).  If you have coughing, wheezing, and shortness of breath, those symptoms may get better when you quit. You may also get sick less often. If you are pregnant, quitting smoking can help to lower your chances of having a baby of low birth weight. What can I do to help me quit smoking? Talk with your doctor about what can help you quit smoking. Some things you can do (strategies) include:  Quitting smoking totally, instead of slowly cutting back how much you smoke over a period of time.  Going to in-person counseling. You are more likely to quit if you go to many counseling sessions.  Using resources and support systems, such as: ? Online chats with a counselor. ? Phone quitlines. ? Printed self-help materials. ? Support groups or group counseling. ? Text messaging programs. ? Mobile phone apps or applications.  Taking medicines. Some of these medicines may have nicotine in them. If you are pregnant or breastfeeding, do not take any medicines to quit smoking unless your doctor says it is okay. Talk with your doctor about counseling or other things that can help you.  Talk with your doctor about using more than one strategy at the same time, such as taking medicines while you are also going to in-person counseling. This can help make  quitting easier. What things can I do to make it easier to quit? Quitting smoking might feel very hard at first, but there is a lot that you can do to make it easier. Take these steps:  Talk to your family and friends. Ask them to support and encourage you.  Call phone quitlines, reach out to support groups, or work with a counselor.  Ask people who smoke to not smoke around you.  Avoid places that make you want (trigger) to smoke, such as: ? Bars. ? Parties. ? Smoke-break areas at work.  Spend time with people who do not smoke.  Lower the stress in your life. Stress can make you want to smoke. Try these things to help your stress: ? Getting regular exercise. ? Deep-breathing exercises. ? Yoga. ? Meditating. ? Doing a body scan. To do this, close your eyes, focus on one area of your body at a time from head to toe, and notice which parts of your body are tense. Try to relax the muscles in those areas.  Download or buy apps on your mobile phone or tablet that can help you stick to your quit plan. There are many free apps, such as QuitGuide from the CDC (Centers for Disease Control and Prevention). You can find more support from smokefree.gov and other websites.  This information is not intended to replace advice given to you by your health care provider. Make sure you discuss any questions you have with your health care provider. Document Released: 02/15/2009 Document   Revised: 12/18/2015 Document Reviewed: 09/05/2014 Elsevier Interactive Patient Education  2018 Elsevier Inc.     Peripheral Vascular Disease Peripheral vascular disease (PVD) is a disease of the blood vessels that are not part of your heart and brain. A simple term for PVD is poor circulation. In most cases, PVD narrows the blood vessels that carry blood from your heart to the rest of your body. This can result in a decreased supply of blood to your arms, legs, and internal organs, like your stomach or kidneys.  However, it most often affects a person's lower legs and feet. There are two types of PVD.  Organic PVD. This is the more common type. It is caused by damage to the structure of blood vessels.  Functional PVD. This is caused by conditions that make blood vessels contract and tighten (spasm).  Without treatment, PVD tends to get worse over time. PVD can also lead to acute ischemic limb. This is when an arm or limb suddenly has trouble getting enough blood. This is a medical emergency. Follow these instructions at home:  Take medicines only as told by your doctor.  Do not use any tobacco products, including cigarettes, chewing tobacco, or electronic cigarettes. If you need help quitting, ask your doctor.  Lose weight if you are overweight, and maintain a healthy weight as told by your doctor.  Eat a diet that is low in fat and cholesterol. If you need help, ask your doctor.  Exercise regularly. Ask your doctor for some good activities for you.  Take good care of your feet. ? Wear comfortable shoes that fit well. ? Check your feet often for any cuts or sores. Contact a doctor if:  You have cramps in your legs while walking.  You have leg pain when you are at rest.  You have coldness in a leg or foot.  Your skin changes.  You are unable to get or have an erection (erectile dysfunction).  You have cuts or sores on your feet that are not healing. Get help right away if:  Your arm or leg turns cold and blue.  Your arms or legs become red, warm, swollen, painful, or numb.  You have chest pain or trouble breathing.  You suddenly have weakness in your face, arm, or leg.  You become very confused or you cannot speak.  You suddenly have a very bad headache.  You suddenly cannot see. This information is not intended to replace advice given to you by your health care provider. Make sure you discuss any questions you have with your health care provider. Document Released:  07/16/2009 Document Revised: 09/27/2015 Document Reviewed: 09/29/2013 Elsevier Interactive Patient Education  2017 Elsevier Inc.  
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
## 2017-04-17 NOTE — Addendum Note (Signed)
Addended by: Lianne Cure A on: 04/17/2017 11:11 AM   Modules accepted: Orders

## 2017-08-28 ENCOUNTER — Other Ambulatory Visit: Payer: Self-pay | Admitting: Urology

## 2017-09-23 NOTE — Progress Notes (Addendum)
Reviewing pt chart for pre-op interview.  Noted pt has moderate- severe aortic stenosis w/ valve area 0.88cm^2.  This below anesthesia guidelines of 1.0 and above for ambulatory surgery center.  Pt will need to be done at main OR.  Called and lvm for coni, OR scheduler for dr winter, inform her of pt's dx severe aortic stenosis and per anesthesia guidelines pt would to be done at main OR.

## 2017-10-02 NOTE — Patient Instructions (Addendum)
Matthew [name]  10/02/2017   Your procedure is scheduled on: 10-07-17   Report to [facility] Main  Entrance     Report to Admitting at 8:30 AM    Call this number if you have problems the morning of surgery [phone]   Remember: Do not eat food or drink liquids :After Midnight.     Take these medicines the morning of surgery with A SIP OF WATER: Amlodipine (Norvasc), Gabapentin (Neurontin), and Cardura (Doxasin)                               You may not have any metal on your body including hair pins and              piercings  Do not wear jewelry, lotions, powders or deodorant             Men may shave face and neck.   Do not bring valuables to the hospital. [place].  Contacts, dentures or bridgework may not be worn into surgery.  Leave suitcase in the car. After surgery it may be brought to your room.      Special Instructions: N/A              Please read over the following fact sheets you were given: _____________________________________________________________________          [facility] - Preparing for Surgery Before surgery, you can play an important role.  Because skin is not sterile, your skin needs to be as free of germs as possible.  You can reduce the number of germs on your skin by washing with CHG (chlorahexidine gluconate) soap before surgery.  CHG is an antiseptic cleaner which kills germs and bonds with the skin to continue killing germs even after washing. Please DO NOT use if you have an allergy to CHG or antibacterial soaps.  If your skin becomes reddened/irritated stop using the CHG and inform your nurse when you arrive at Short Stay. Do not shave (including legs and underarms) for at least 48 hours prior to the first CHG shower.  You may shave your face/neck. Please follow these instructions carefully:  1.  Shower with CHG Soap the night before surgery and the  morning of  Surgery.  2.  If you choose to wash your hair, wash your hair first as usual with your  normal  shampoo.  3.  After you shampoo, rinse your hair and body thoroughly to remove the  shampoo.                           4.  Use CHG as you would any other liquid soap.  You can apply chg directly  to the skin and wash                       Gently with a scrungie or clean washcloth.  5.  Apply the CHG Soap to your body ONLY FROM THE NECK DOWN.   Do not use on face/ open                           Wound or open  sores. Avoid contact with eyes, ears mouth and genitals (private parts).                       Wash face,  Genitals (private parts) with your normal soap.             6.  Wash thoroughly, paying special attention to the area where your surgery  will be performed.  7.  Thoroughly rinse your body with warm water from the neck down.  8.  DO NOT shower/wash with your normal soap after using and rinsing off  the CHG Soap.                9.  Pat yourself dry with a clean towel.            10.  Wear clean pajamas.            11.  Place clean sheets on your bed the night of your first shower and do not  sleep with pets. Day of Surgery : Do not apply any lotions/deodorants the morning of surgery.  Please wear clean clothes to the hospital/surgery center.  FAILURE TO FOLLOW THESE INSTRUCTIONS MAY RESULT IN THE CANCELLATION OF YOUR SURGERY PATIENT SIGNATURE_________________________________  NURSE SIGNATURE__________________________________  ________________________________________________________________________

## 2017-10-05 ENCOUNTER — Other Ambulatory Visit: Payer: Self-pay

## 2017-10-05 ENCOUNTER — Encounter (HOSPITAL_COMMUNITY)
Admission: RE | Admit: 2017-10-05 | Discharge: 2017-10-05 | Disposition: A | Discharge status: Home / Self Care | Payer: No Typology Code available for payment source | Source: Ambulatory Visit | Attending: Urology | Admitting: Urology

## 2017-10-05 ENCOUNTER — Encounter (HOSPITAL_COMMUNITY): Payer: Self-pay

## 2017-10-05 DIAGNOSIS — N210 Calculus in bladder: Secondary | ICD-10-CM | POA: Diagnosis not present

## 2017-10-05 DIAGNOSIS — Z01810 Encounter for preprocedural cardiovascular examination: Secondary | ICD-10-CM | POA: Diagnosis present

## 2017-10-05 DIAGNOSIS — Z01812 Encounter for preprocedural laboratory examination: Secondary | ICD-10-CM | POA: Diagnosis not present

## 2017-10-05 DIAGNOSIS — R9431 Abnormal electrocardiogram [ECG] [EKG]: Secondary | ICD-10-CM | POA: Insufficient documentation

## 2017-10-05 DIAGNOSIS — I10 Essential (primary) hypertension: Secondary | ICD-10-CM | POA: Insufficient documentation

## 2017-10-05 LAB — CBC
HEMATOCRIT: 37.5 % — AB (ref 39.0–52.0)
Hemoglobin: 12.9 g/dL — ABNORMAL LOW (ref 13.0–17.0)
MCH: 30.4 pg (ref 26.0–34.0)
MCHC: 34.4 g/dL (ref 30.0–36.0)
MCV: 88.4 fL (ref 78.0–100.0)
Platelets: 251 10*3/uL (ref 150–400)
RBC: 4.24 MIL/uL (ref 4.22–5.81)
RDW: 12.4 % (ref 11.5–15.5)
WBC: 7.5 10*3/uL (ref 4.0–10.5)

## 2017-10-05 LAB — BASIC METABOLIC PANEL
Anion gap: 12 (ref 5–15)
BUN: 17 mg/dL (ref 6–20)
CHLORIDE: 108 mmol/L (ref 101–111)
CO2: 22 mmol/L (ref 22–32)
Calcium: 9.3 mg/dL (ref 8.9–10.3)
Creatinine, Ser: 1.81 mg/dL — ABNORMAL HIGH (ref 0.61–1.24)
GFR calc non Af Amer: 35 mL/min — ABNORMAL LOW (ref >60)
GFR, EST AFRICAN AMERICAN: 41 mL/min — AB (ref >60)
Glucose, Bld: 105 mg/dL — ABNORMAL HIGH (ref 65–99)
POTASSIUM: 4.2 mmol/L (ref 3.5–5.1)
Sodium: 142 mmol/L (ref 135–145)

## 2017-10-05 LAB — HEMOGLOBIN A1C
Hgb A1c MFr Bld: 5.6 % (ref 4.8–5.6)
Mean Plasma Glucose: 114.02 mg/dL

## 2017-10-05 NOTE — Progress Notes (Signed)
Spoke to Anesthesilogist regarding pt's hx of moderate to severe aortic stenosis. Per Anesthesia, pt will require cardiac clearance. Beola Cord at Dr. Jackson Latino office made aware.   Pt also had a eczema appearance rash in the bend of his left elbow. Pt indicated that he may have had an reaction from a new lotion that just tried.

## 2017-10-07 ENCOUNTER — Encounter (HOSPITAL_COMMUNITY): Admission: RE | Payer: Self-pay | Source: Ambulatory Visit

## 2017-10-07 ENCOUNTER — Ambulatory Visit (HOSPITAL_COMMUNITY): Admission: RE | Admit: 2017-10-07 | Payer: Non-veteran care | Source: Ambulatory Visit | Admitting: Urology

## 2017-10-07 SURGERY — CYSTOSCOPY, WITH BLADDER CALCULUS LITHOLAPAXY
Anesthesia: General

## 2017-10-20 ENCOUNTER — Ambulatory Visit (INDEPENDENT_AMBULATORY_CARE_PROVIDER_SITE_OTHER)
Admission: RE | Admit: 2017-10-20 | Discharge: 2017-10-20 | Disposition: A | Discharge status: Home / Self Care | Payer: No Typology Code available for payment source | Source: Ambulatory Visit | Attending: Family | Admitting: Family

## 2017-10-20 ENCOUNTER — Ambulatory Visit (INDEPENDENT_AMBULATORY_CARE_PROVIDER_SITE_OTHER): Payer: No Typology Code available for payment source | Admitting: Family

## 2017-10-20 ENCOUNTER — Encounter: Payer: Self-pay | Admitting: Family

## 2017-10-20 ENCOUNTER — Other Ambulatory Visit: Payer: Self-pay

## 2017-10-20 ENCOUNTER — Ambulatory Visit (HOSPITAL_COMMUNITY)
Admission: RE | Admit: 2017-10-20 | Discharge: 2017-10-20 | Disposition: A | Discharge status: Home / Self Care | Payer: No Typology Code available for payment source | Source: Ambulatory Visit | Attending: Family | Admitting: Family

## 2017-10-20 VITALS — BP 141/84 | HR 63 | Temp 98.4°F | Resp 20 | Ht 68.0 in | Wt 200.2 lb

## 2017-10-20 DIAGNOSIS — R9389 Abnormal findings on diagnostic imaging of other specified body structures: Secondary | ICD-10-CM | POA: Insufficient documentation

## 2017-10-20 DIAGNOSIS — E785 Hyperlipidemia, unspecified: Secondary | ICD-10-CM | POA: Diagnosis not present

## 2017-10-20 DIAGNOSIS — F17200 Nicotine dependence, unspecified, uncomplicated: Secondary | ICD-10-CM | POA: Insufficient documentation

## 2017-10-20 DIAGNOSIS — I779 Disorder of arteries and arterioles, unspecified: Secondary | ICD-10-CM | POA: Insufficient documentation

## 2017-10-20 DIAGNOSIS — Z89412 Acquired absence of left great toe: Secondary | ICD-10-CM | POA: Diagnosis not present

## 2017-10-20 DIAGNOSIS — E119 Type 2 diabetes mellitus without complications: Secondary | ICD-10-CM | POA: Insufficient documentation

## 2017-10-20 DIAGNOSIS — R0989 Other specified symptoms and signs involving the circulatory and respiratory systems: Secondary | ICD-10-CM | POA: Diagnosis present

## 2017-10-20 NOTE — Patient Instructions (Signed)
Steps to Quit Smoking Smoking tobacco can be bad for your health. It can also affect almost every organ in your body. Smoking puts you and people around you at risk for many serious long-lasting (chronic) diseases. Quitting smoking is hard, but it is one of the best things that you can do for your health. It is never too late to quit. What are the benefits of quitting smoking? When you quit smoking, you lower your risk for getting serious diseases and conditions. They can include:  Lung cancer or lung disease.  Heart disease.  Stroke.  Heart attack.  Not being able to have children (infertility).  Weak bones (osteoporosis) and broken bones (fractures).  If you have coughing, wheezing, and shortness of breath, those symptoms may get better when you quit. You may also get sick less often. If you are pregnant, quitting smoking can help to lower your chances of having a baby of low birth weight. What can I do to help me quit smoking? Talk with your doctor about what can help you quit smoking. Some things you can do (strategies) include:  Quitting smoking totally, instead of slowly cutting back how much you smoke over a period of time.  Going to in-person counseling. You are more likely to quit if you go to many counseling sessions.  Using resources and support systems, such as: ? Online chats with a counselor. ? Phone quitlines. ? Printed self-help materials. ? Support groups or group counseling. ? Text messaging programs. ? Mobile phone apps or applications.  Taking medicines. Some of these medicines may have nicotine in them. If you are pregnant or breastfeeding, do not take any medicines to quit smoking unless your doctor says it is okay. Talk with your doctor about counseling or other things that can help you.  Talk with your doctor about using more than one strategy at the same time, such as taking medicines while you are also going to in-person counseling. This can help make  quitting easier. What things can I do to make it easier to quit? Quitting smoking might feel very hard at first, but there is a lot that you can do to make it easier. Take these steps:  Talk to your family and friends. Ask them to support and encourage you.  Call phone quitlines, reach out to support groups, or work with a counselor.  Ask people who smoke to not smoke around you.  Avoid places that make you want (trigger) to smoke, such as: ? Bars. ? Parties. ? Smoke-break areas at work.  Spend time with people who do not smoke.  Lower the stress in your life. Stress can make you want to smoke. Try these things to help your stress: ? Getting regular exercise. ? Deep-breathing exercises. ? Yoga. ? Meditating. ? Doing a body scan. To do this, close your eyes, focus on one area of your body at a time from head to toe, and notice which parts of your body are tense. Try to relax the muscles in those areas.  Download or buy apps on your mobile phone or tablet that can help you stick to your quit plan. There are many free apps, such as QuitGuide from the CDC (Centers for Disease Control and Prevention). You can find more support from smokefree.gov and other websites.  This information is not intended to replace advice given to you by your health care provider. Make sure you discuss any questions you have with your health care provider. Document Released: 02/15/2009 Document   Revised: 12/18/2015 Document Reviewed: 09/05/2014 Elsevier Interactive Patient Education  2018 Elsevier Inc.     Peripheral Vascular Disease Peripheral vascular disease (PVD) is a disease of the blood vessels that are not part of your heart and brain. A simple term for PVD is poor circulation. In most cases, PVD narrows the blood vessels that carry blood from your heart to the rest of your body. This can result in a decreased supply of blood to your arms, legs, and internal organs, like your stomach or kidneys.  However, it most often affects a person's lower legs and feet. There are two types of PVD.  Organic PVD. This is the more common type. It is caused by damage to the structure of blood vessels.  Functional PVD. This is caused by conditions that make blood vessels contract and tighten (spasm).  Without treatment, PVD tends to get worse over time. PVD can also lead to acute ischemic limb. This is when an arm or limb suddenly has trouble getting enough blood. This is a medical emergency. Follow these instructions at home:  Take medicines only as told by your doctor.  Do not use any tobacco products, including cigarettes, chewing tobacco, or electronic cigarettes. If you need help quitting, ask your doctor.  Lose weight if you are overweight, and maintain a healthy weight as told by your doctor.  Eat a diet that is low in fat and cholesterol. If you need help, ask your doctor.  Exercise regularly. Ask your doctor for some good activities for you.  Take good care of your feet. ? Wear comfortable shoes that fit well. ? Check your feet often for any cuts or sores. Contact a doctor if:  You have cramps in your legs while walking.  You have leg pain when you are at rest.  You have coldness in a leg or foot.  Your skin changes.  You are unable to get or have an erection (erectile dysfunction).  You have cuts or sores on your feet that are not healing. Get help right away if:  Your arm or leg turns cold and blue.  Your arms or legs become red, warm, swollen, painful, or numb.  You have chest pain or trouble breathing.  You suddenly have weakness in your face, arm, or leg.  You become very confused or you cannot speak.  You suddenly have a very bad headache.  You suddenly cannot see. This information is not intended to replace advice given to you by your health care provider. Make sure you discuss any questions you have with your health care provider. Document Released:  07/16/2009 Document Revised: 09/27/2015 Document Reviewed: 09/29/2013 Elsevier Interactive Patient Education  2017 Elsevier Inc.  
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
## 2017-11-10 ENCOUNTER — Other Ambulatory Visit: Payer: Self-pay | Admitting: Urology

## 2017-11-12 ENCOUNTER — Other Ambulatory Visit: Payer: Self-pay

## 2017-11-12 DIAGNOSIS — I70245 Atherosclerosis of native arteries of left leg with ulceration of other part of foot: Secondary | ICD-10-CM

## 2017-11-12 DIAGNOSIS — I779 Disorder of arteries and arterioles, unspecified: Secondary | ICD-10-CM

## 2017-11-12 DIAGNOSIS — I739 Peripheral vascular disease, unspecified: Secondary | ICD-10-CM

## 2017-12-04 ENCOUNTER — Encounter (HOSPITAL_BASED_OUTPATIENT_CLINIC_OR_DEPARTMENT_OTHER): Payer: Self-pay | Admitting: *Deleted

## 2017-12-04 NOTE — Progress Notes (Signed)
Spoke w/ pt wife today , completed medication list and history in epic.  Noted pt has severe aortic stenosis w/ valved area of 0.88 cm^2.  Due to anesthesia guidelines for ambulatory surgery center pt is not candidate for wlsc.  Called and lvm for pam, or scheduler for dr winter, informed her of this and pt would need to be done at main OR.  Wife verbalized understanding why her husband would need to be done at main OR and would get a call from dr winter's or scheduler to reschedule.

## 2017-12-04 NOTE — Progress Notes (Signed)
Spoke w/ pt wife, gwen, via phone.  Completed medication list and history in epic.  Noted pt has severe aortic stenosis with a valva area of 0.88cm^2.  Pt not a candidate for wlsc due to anesthesia guidelines for ambulatory surgery center.  Wife verbalized understanding why her husband will need to be rescheduled at main OR and would get call from dr winter or scheduler.  Called and lvm for pam, or scheduler for dr winter, informed her of this and that pt needs to be moved to main OR.

## 2017-12-10 NOTE — Patient Instructions (Addendum)
Matthew [name]  12/10/2017   Your procedure is scheduled on: 12-16-17  Report to [facility] Main  Entrance  Report to admitting at 1100 AM    Call this number if you have problems the morning of surgery [phone]   Remember: Do not eat food  :After Midnight.MAY HAVE CLEAR LIQUIDS FROM MIDNIGHT UNTIL 700 AM MORNING OF SURGERY. NOTHING BY MOUTH AFTER 700 AM MORNING OG SURGERY.   TAKE THESE MEDICATIONS MORNING OF SURGERY WITH A SIP OF WATER: OXYCODODONE, GABEPNTIN  ,  NORVASC  CLEAR LIQUID DIET   Foods Allowed                                                                     Foods Excluded  Coffee and tea, regular and decaf                             liquids that you cannot  Plain Jell-O in any flavor                                             see through such as: Fruit ices (not with fruit pulp)                                     milk, soups, orange juice  Iced Popsicles                                    All solid food Carbonated beverages, regular and diet                                    Cranberry, grape and apple juices Sports drinks like Gatorade Lightly seasoned clear broth or consume(fat free) Sugar, honey syrup  Sample Menu Breakfast                                Lunch                                     Supper Cranberry juice                    Beef broth                            Chicken broth Jell-O                                     Grape juice  Apple juice Coffee or tea                        Jell-O                                      Popsicle                                                Coffee or tea                        Coffee or tea                                 You may not have any metal on your body including hair pins and              piercings  Do not wear jewelry, make-up, lotions, powders or perfumes, deodorant             Do not wear nail polish.  Do not shave  48 hours prior to surgery.              Men may shave face and neck.   Do not bring valuables to the hospital. [place].  Contacts, dentures or bridgework may not be worn into surgery.  Leave suitcase in the car. After surgery it may be brought to your room.   _____________________________________________________________________  [facility] - Preparing for Surgery Before surgery, you can play an important role.  Because skin is not sterile, your skin needs to be as free of germs as possible.  You can reduce the number of germs on your skin by washing with CHG (chlorahexidine gluconate) soap before surgery.  CHG is an antiseptic cleaner which kills germs and bonds with the skin to continue killing germs even after washing. Please DO NOT use if you have an allergy to CHG or antibacterial soaps.  If your skin becomes reddened/irritated stop using the CHG and inform your nurse when you arrive at Short Stay. Do not shave (including legs and underarms) for at least 48 hours prior to the first CHG shower.  You may shave your face/neck. Please follow these instructions carefully:  1.  Shower with CHG Soap the night before surgery and the  morning of Surgery.  2.  If you choose to wash your hair, wash your hair first as usual with your  normal  shampoo.  3.  After you shampoo, rinse your hair and body thoroughly to remove the  shampoo.                           4.  Use CHG as you would any other liquid soap.  You can apply chg directly  to the skin and wash                       Gently with a scrungie or clean washcloth.  5.  Apply the CHG Soap to your body ONLY FROM  THE NECK DOWN.   Do not use on face/ open                           Wound or open sores. Avoid contact with eyes, ears mouth and genitals (private parts).                       Wash face,  Genitals (private parts) with your normal soap.             6.  Wash thoroughly, paying special attention to the area where your  surgery  will be performed.  7.  Thoroughly rinse your body with warm water from the neck down.  8.  DO NOT shower/wash with your normal soap after using and rinsing off  the CHG Soap.                9.  Pat yourself dry with a clean towel.            10.  Wear clean pajamas.            11.  Place clean sheets on your bed the night of your first shower and do not  sleep with pets. Day of Surgery : Do not apply any lotions/deodorants the morning of surgery.  Please wear clean clothes to the hospital/surgery center.  FAILURE TO FOLLOW THESE INSTRUCTIONS MAY RESULT IN THE CANCELLATION OF YOUR SURGERY PATIENT SIGNATURE_________________________________  NURSE SIGNATURE__________________________________  ________________________________________________________________________

## 2017-12-10 NOTE — Progress Notes (Signed)
LEFT MESSAGE WITH CONNIE MABE SURGERY SCHEDULER [facility] DR WINTER PATIENT WILL REQUIRE CARDIAC CLEARANCE PRIOR TO 12-16-17 SURGERY DUE TO SEVERE AORTIC STENOSIS PER 01-15-16 ECHO [facility] [place]. MADE CONNIE MABE AWARE PRE OP FOR PATIENT IS 12-14-17.

## 2017-12-14 ENCOUNTER — Encounter (HOSPITAL_COMMUNITY)
Admission: RE | Admit: 2017-12-14 | Discharge: 2017-12-14 | Disposition: A | Discharge status: Home / Self Care | Payer: Non-veteran care | Source: Ambulatory Visit | Attending: Urology | Admitting: Urology

## 2017-12-14 ENCOUNTER — Encounter (HOSPITAL_COMMUNITY): Payer: Self-pay

## 2017-12-14 ENCOUNTER — Other Ambulatory Visit: Payer: Self-pay

## 2017-12-14 DIAGNOSIS — Z01812 Encounter for preprocedural laboratory examination: Secondary | ICD-10-CM | POA: Diagnosis not present

## 2017-12-14 DIAGNOSIS — N401 Enlarged prostate with lower urinary tract symptoms: Secondary | ICD-10-CM | POA: Insufficient documentation

## 2017-12-14 HISTORY — DX: Nonrheumatic aortic (valve) stenosis: I35.0

## 2017-12-14 HISTORY — DX: Calculus in bladder: N21.0

## 2017-12-14 HISTORY — DX: Benign prostatic hyperplasia without lower urinary tract symptoms: N40.0

## 2017-12-14 LAB — BASIC METABOLIC PANEL
ANION GAP: 9 (ref 5–15)
BUN: 18 mg/dL (ref 8–23)
CALCIUM: 9.6 mg/dL (ref 8.9–10.3)
CO2: 27 mmol/L (ref 22–32)
Chloride: 108 mmol/L (ref 98–111)
Creatinine, Ser: 1.98 mg/dL — ABNORMAL HIGH (ref 0.61–1.24)
GFR calc Af Amer: 37 mL/min — ABNORMAL LOW (ref >60)
GFR calc non Af Amer: 32 mL/min — ABNORMAL LOW (ref >60)
Glucose, Bld: 91 mg/dL (ref 70–99)
Potassium: 4.3 mmol/L (ref 3.5–5.1)
Sodium: 144 mmol/L (ref 135–145)

## 2017-12-14 LAB — CBC
HCT: 41.3 % (ref 39.0–52.0)
HEMOGLOBIN: 14.2 g/dL (ref 13.0–17.0)
MCH: 30.5 pg (ref 26.0–34.0)
MCHC: 34.4 g/dL (ref 30.0–36.0)
MCV: 88.8 fL (ref 78.0–100.0)
Platelets: 238 10*3/uL (ref 150–400)
RBC: 4.65 MIL/uL (ref 4.22–5.81)
RDW: 12.3 % (ref 11.5–15.5)
WBC: 6.1 10*3/uL (ref 4.0–10.5)

## 2017-12-14 NOTE — Progress Notes (Signed)
SPOKE WITH DR Renold Don MDA AND REVIEWED MEDICAL HX AND HAVE CARDIAC CLEARANCE [facility], ALSO REVIEWED October 05 2017 EKG, NO NOT NEED TO REPEAT EKG PER DR BROCK. ANESTHESIA WILL SEE PATIENT MORNING OF SURGERY PER DR Fransisco Beau.

## 2017-12-15 LAB — HEMOGLOBIN A1C
Hgb A1c MFr Bld: 5.2 % (ref 4.8–5.6)
MEAN PLASMA GLUCOSE: 103 mg/dL

## 2017-12-16 ENCOUNTER — Encounter (HOSPITAL_COMMUNITY): Payer: Self-pay | Admitting: Anesthesiology

## 2017-12-16 ENCOUNTER — Other Ambulatory Visit: Payer: Self-pay

## 2017-12-16 ENCOUNTER — Encounter (HOSPITAL_COMMUNITY)
Admission: RE | Disposition: A | Discharge status: Home / Self Care | Payer: Self-pay | Source: Ambulatory Visit | Attending: Urology

## 2017-12-16 ENCOUNTER — Ambulatory Visit (HOSPITAL_COMMUNITY): Payer: No Typology Code available for payment source | Admitting: Anesthesiology

## 2017-12-16 ENCOUNTER — Inpatient Hospital Stay (HOSPITAL_COMMUNITY)
Admission: RE | Admit: 2017-12-16 | Discharge: 2018-01-03 | DRG: 665 | Disposition: A | Discharge status: Home / Self Care | Payer: No Typology Code available for payment source | Source: Ambulatory Visit | Attending: Surgery | Admitting: Surgery

## 2017-12-16 DIAGNOSIS — E7800 Pure hypercholesterolemia, unspecified: Secondary | ICD-10-CM | POA: Diagnosis present

## 2017-12-16 DIAGNOSIS — I739 Peripheral vascular disease, unspecified: Secondary | ICD-10-CM

## 2017-12-16 DIAGNOSIS — E1142 Type 2 diabetes mellitus with diabetic polyneuropathy: Secondary | ICD-10-CM | POA: Diagnosis present

## 2017-12-16 DIAGNOSIS — I214 Non-ST elevation (NSTEMI) myocardial infarction: Secondary | ICD-10-CM | POA: Diagnosis not present

## 2017-12-16 DIAGNOSIS — F4310 Post-traumatic stress disorder, unspecified: Secondary | ICD-10-CM | POA: Diagnosis present

## 2017-12-16 DIAGNOSIS — R079 Chest pain, unspecified: Secondary | ICD-10-CM

## 2017-12-16 DIAGNOSIS — I6521 Occlusion and stenosis of right carotid artery: Secondary | ICD-10-CM | POA: Diagnosis present

## 2017-12-16 DIAGNOSIS — Z952 Presence of prosthetic heart valve: Secondary | ICD-10-CM

## 2017-12-16 DIAGNOSIS — E1151 Type 2 diabetes mellitus with diabetic peripheral angiopathy without gangrene: Secondary | ICD-10-CM | POA: Diagnosis present

## 2017-12-16 DIAGNOSIS — I129 Hypertensive chronic kidney disease with stage 1 through stage 4 chronic kidney disease, or unspecified chronic kidney disease: Secondary | ICD-10-CM | POA: Diagnosis present

## 2017-12-16 DIAGNOSIS — I350 Nonrheumatic aortic (valve) stenosis: Secondary | ICD-10-CM | POA: Diagnosis present

## 2017-12-16 DIAGNOSIS — N138 Other obstructive and reflux uropathy: Secondary | ICD-10-CM | POA: Diagnosis present

## 2017-12-16 DIAGNOSIS — Z951 Presence of aortocoronary bypass graft: Secondary | ICD-10-CM

## 2017-12-16 DIAGNOSIS — N401 Enlarged prostate with lower urinary tract symptoms: Secondary | ICD-10-CM | POA: Diagnosis present

## 2017-12-16 DIAGNOSIS — N2889 Other specified disorders of kidney and ureter: Secondary | ICD-10-CM | POA: Diagnosis present

## 2017-12-16 DIAGNOSIS — Z79899 Other long term (current) drug therapy: Secondary | ICD-10-CM

## 2017-12-16 DIAGNOSIS — I25119 Atherosclerotic heart disease of native coronary artery with unspecified angina pectoris: Secondary | ICD-10-CM | POA: Diagnosis present

## 2017-12-16 DIAGNOSIS — I2510 Atherosclerotic heart disease of native coronary artery without angina pectoris: Secondary | ICD-10-CM

## 2017-12-16 DIAGNOSIS — R339 Retention of urine, unspecified: Secondary | ICD-10-CM | POA: Diagnosis not present

## 2017-12-16 DIAGNOSIS — N183 Chronic kidney disease, stage 3 (moderate): Secondary | ICD-10-CM | POA: Diagnosis present

## 2017-12-16 DIAGNOSIS — J449 Chronic obstructive pulmonary disease, unspecified: Secondary | ICD-10-CM | POA: Diagnosis present

## 2017-12-16 DIAGNOSIS — D62 Acute posthemorrhagic anemia: Secondary | ICD-10-CM | POA: Diagnosis not present

## 2017-12-16 DIAGNOSIS — R310 Gross hematuria: Secondary | ICD-10-CM | POA: Diagnosis present

## 2017-12-16 DIAGNOSIS — F17210 Nicotine dependence, cigarettes, uncomplicated: Secondary | ICD-10-CM | POA: Diagnosis present

## 2017-12-16 DIAGNOSIS — N210 Calculus in bladder: Principal | ICD-10-CM | POA: Diagnosis present

## 2017-12-16 DIAGNOSIS — Z09 Encounter for follow-up examination after completed treatment for conditions other than malignant neoplasm: Secondary | ICD-10-CM

## 2017-12-16 DIAGNOSIS — E1122 Type 2 diabetes mellitus with diabetic chronic kidney disease: Secondary | ICD-10-CM | POA: Diagnosis present

## 2017-12-16 DIAGNOSIS — Z89412 Acquired absence of left great toe: Secondary | ICD-10-CM

## 2017-12-16 HISTORY — PX: CYSTOSCOPY WITH LITHOLAPAXY: SHX1425

## 2017-12-16 HISTORY — DX: Atherosclerotic heart disease of native coronary artery without angina pectoris: I25.10

## 2017-12-16 HISTORY — PX: TRANSURETHRAL RESECTION OF PROSTATE: SHX73

## 2017-12-16 LAB — GLUCOSE, CAPILLARY: GLUCOSE-CAPILLARY: 100 mg/dL — AB (ref 70–99)

## 2017-12-16 SURGERY — CYSTOSCOPY, WITH BLADDER CALCULUS LITHOLAPAXY
Anesthesia: General | Site: Prostate

## 2017-12-16 MED ORDER — DOXAZOSIN MESYLATE 2 MG PO TABS
2.0000 mg | ORAL_TABLET | Freq: Every day | ORAL | Status: DC
  Administered 2017-12-16 – 2017-12-28 (×13): 2 mg via ORAL
  Filled 2017-12-16: qty 1
  Filled 2017-12-16: qty 2
  Filled 2017-12-16 (×3): qty 1
  Filled 2017-12-16: qty 2
  Filled 2017-12-16 (×2): qty 1
  Filled 2017-12-16: qty 2
  Filled 2017-12-16 (×4): qty 1

## 2017-12-16 MED ORDER — ACETAMINOPHEN 325 MG PO TABS
650.0000 mg | ORAL_TABLET | ORAL | Status: DC | PRN
  Administered 2017-12-19 – 2017-12-21 (×5): 650 mg via ORAL
  Filled 2017-12-16 (×5): qty 2

## 2017-12-16 MED ORDER — LACTATED RINGERS IV SOLN
INTRAVENOUS | Status: DC
  Administered 2017-12-16: 1000 mL via INTRAVENOUS

## 2017-12-16 MED ORDER — BISMUTH SUBSALICYLATE 262 MG/15ML PO SUSP
30.0000 mL | Freq: Four times a day (QID) | ORAL | Status: DC | PRN
  Administered 2017-12-18: 30 mL via ORAL
  Filled 2017-12-16: qty 236

## 2017-12-16 MED ORDER — LIDOCAINE 2% (20 MG/ML) 5 ML SYRINGE
INTRAMUSCULAR | Status: DC | PRN
  Administered 2017-12-16: 60 mg via INTRAVENOUS

## 2017-12-16 MED ORDER — SODIUM CHLORIDE 0.9 % IV SOLN
INTRAVENOUS | Status: DC
  Administered 2017-12-16 – 2017-12-18 (×3): via INTRAVENOUS

## 2017-12-16 MED ORDER — METOCLOPRAMIDE HCL 5 MG/ML IJ SOLN: 10.0000 mg | Freq: Once | INTRAMUSCULAR | Status: DC | PRN

## 2017-12-16 MED ORDER — MEPERIDINE HCL 50 MG/ML IJ SOLN: 6.2500 mg | INTRAMUSCULAR | Status: DC | PRN

## 2017-12-16 MED ORDER — DEXAMETHASONE SODIUM PHOSPHATE 10 MG/ML IJ SOLN
INTRAMUSCULAR | Status: AC
  Filled 2017-12-16: qty 1

## 2017-12-16 MED ORDER — EPHEDRINE SULFATE-NACL 50-0.9 MG/10ML-% IV SOSY
PREFILLED_SYRINGE | INTRAVENOUS | Status: DC | PRN
  Administered 2017-12-16: 5 mg via INTRAVENOUS
  Administered 2017-12-16: 10 mg via INTRAVENOUS
  Administered 2017-12-16: 5 mg via INTRAVENOUS

## 2017-12-16 MED ORDER — ATORVASTATIN CALCIUM 20 MG PO TABS
20.0000 mg | ORAL_TABLET | Freq: Every day | ORAL | Status: DC
  Administered 2017-12-16 – 2017-12-17 (×2): 20 mg via ORAL
  Filled 2017-12-16 (×2): qty 1

## 2017-12-16 MED ORDER — OXYBUTYNIN CHLORIDE ER 10 MG PO TB24
10.0000 mg | ORAL_TABLET | Freq: Every day | ORAL | Status: DC
  Administered 2017-12-16 – 2018-01-03 (×17): 10 mg via ORAL
  Filled 2017-12-16 (×3): qty 1
  Filled 2017-12-16: qty 2
  Filled 2017-12-16 (×2): qty 1
  Filled 2017-12-16: qty 2
  Filled 2017-12-16: qty 1
  Filled 2017-12-16: qty 2
  Filled 2017-12-16: qty 1
  Filled 2017-12-16: qty 2
  Filled 2017-12-16: qty 1
  Filled 2017-12-16 (×2): qty 2
  Filled 2017-12-16 (×5): qty 1

## 2017-12-16 MED ORDER — CEFAZOLIN SODIUM-DEXTROSE 2-4 GM/100ML-% IV SOLN
2.0000 g | Freq: Once | INTRAVENOUS | Status: AC
  Administered 2017-12-16: 2 g via INTRAVENOUS

## 2017-12-16 MED ORDER — DIPHENHYDRAMINE HCL 12.5 MG/5ML PO ELIX
12.5000 mg | ORAL_SOLUTION | Freq: Four times a day (QID) | ORAL | Status: DC | PRN
  Filled 2017-12-16: qty 5

## 2017-12-16 MED ORDER — ONDANSETRON HCL 4 MG/2ML IJ SOLN
INTRAMUSCULAR | Status: AC
  Filled 2017-12-16: qty 2

## 2017-12-16 MED ORDER — GABAPENTIN 300 MG PO CAPS
600.0000 mg | ORAL_CAPSULE | Freq: Three times a day (TID) | ORAL | Status: DC
  Administered 2017-12-16 – 2017-12-28 (×36): 600 mg via ORAL
  Filled 2017-12-16 (×38): qty 2

## 2017-12-16 MED ORDER — ONDANSETRON HCL 4 MG/2ML IJ SOLN
INTRAMUSCULAR | Status: DC | PRN
  Administered 2017-12-16: 4 mg via INTRAVENOUS

## 2017-12-16 MED ORDER — AMLODIPINE BESYLATE 10 MG PO TABS
10.0000 mg | ORAL_TABLET | Freq: Every morning | ORAL | Status: DC
  Administered 2017-12-17 – 2017-12-28 (×11): 10 mg via ORAL
  Filled 2017-12-16 (×13): qty 1

## 2017-12-16 MED ORDER — FENTANYL CITRATE (PF) 100 MCG/2ML IJ SOLN: 25.0000 ug | INTRAMUSCULAR | Status: DC | PRN

## 2017-12-16 MED ORDER — FENTANYL CITRATE (PF) 100 MCG/2ML IJ SOLN
INTRAMUSCULAR | Status: AC
  Filled 2017-12-16: qty 2

## 2017-12-16 MED ORDER — PROPOFOL 10 MG/ML IV BOLUS
INTRAVENOUS | Status: AC
  Filled 2017-12-16: qty 20

## 2017-12-16 MED ORDER — LATANOPROST 0.005 % OP SOLN
1.0000 [drp] | Freq: Every day | OPHTHALMIC | Status: DC
  Administered 2017-12-16 – 2018-01-02 (×16): 1 [drp] via OPHTHALMIC
  Filled 2017-12-16 (×4): qty 2.5

## 2017-12-16 MED ORDER — OXYCODONE HCL 5 MG PO TABS
5.0000 mg | ORAL_TABLET | ORAL | Status: DC | PRN
  Administered 2017-12-16 – 2017-12-28 (×26): 5 mg via ORAL
  Filled 2017-12-16 (×27): qty 1

## 2017-12-16 MED ORDER — SODIUM CHLORIDE 0.9 % IR SOLN
3000.0000 mL | Status: DC
  Administered 2017-12-16 – 2017-12-29 (×31): 3000 mL

## 2017-12-16 MED ORDER — PHENYLEPHRINE 40 MCG/ML (10ML) SYRINGE FOR IV PUSH (FOR BLOOD PRESSURE SUPPORT)
PREFILLED_SYRINGE | INTRAVENOUS | Status: AC
  Filled 2017-12-16: qty 10

## 2017-12-16 MED ORDER — CEFAZOLIN SODIUM-DEXTROSE 1-4 GM/50ML-% IV SOLN
1.0000 g | Freq: Three times a day (TID) | INTRAVENOUS | Status: AC
  Administered 2017-12-16 – 2017-12-17 (×3): 1 g via INTRAVENOUS
  Filled 2017-12-16 (×3): qty 50

## 2017-12-16 MED ORDER — PHENYLEPHRINE HCL 10 MG/ML IJ SOLN
INTRAMUSCULAR | Status: AC
  Filled 2017-12-16: qty 2

## 2017-12-16 MED ORDER — EPHEDRINE 5 MG/ML INJ
INTRAVENOUS | Status: AC
  Filled 2017-12-16: qty 10

## 2017-12-16 MED ORDER — ONDANSETRON HCL 4 MG/2ML IJ SOLN: 4.0000 mg | INTRAMUSCULAR | Status: DC | PRN

## 2017-12-16 MED ORDER — OXYCODONE-ACETAMINOPHEN 5-325 MG PO TABS
1.0000 | ORAL_TABLET | ORAL | Status: DC | PRN
  Administered 2017-12-16 – 2017-12-28 (×37): 1 via ORAL
  Filled 2017-12-16 (×38): qty 1

## 2017-12-16 MED ORDER — PROPOFOL 10 MG/ML IV BOLUS
INTRAVENOUS | Status: DC | PRN
  Administered 2017-12-16: 150 mg via INTRAVENOUS
  Administered 2017-12-16: 20 mg via INTRAVENOUS
  Administered 2017-12-16: 30 mg via INTRAVENOUS
  Administered 2017-12-16: 20 mg via INTRAVENOUS

## 2017-12-16 MED ORDER — LISINOPRIL 10 MG PO TABS
10.0000 mg | ORAL_TABLET | Freq: Every morning | ORAL | Status: DC
  Administered 2017-12-17 – 2017-12-18 (×2): 10 mg via ORAL
  Filled 2017-12-16 (×2): qty 1

## 2017-12-16 MED ORDER — LIDOCAINE 2% (20 MG/ML) 5 ML SYRINGE
INTRAMUSCULAR | Status: AC
  Filled 2017-12-16: qty 5

## 2017-12-16 MED ORDER — BELLADONNA ALKALOIDS-OPIUM 16.2-60 MG RE SUPP
1.0000 | Freq: Four times a day (QID) | RECTAL | Status: DC | PRN
  Administered 2017-12-16: 1 via RECTAL
  Filled 2017-12-16: qty 1

## 2017-12-16 MED ORDER — DIPHENHYDRAMINE HCL 50 MG/ML IJ SOLN: 12.5000 mg | Freq: Four times a day (QID) | INTRAMUSCULAR | Status: DC | PRN

## 2017-12-16 MED ORDER — MORPHINE SULFATE (PF) 2 MG/ML IV SOLN
2.0000 mg | INTRAVENOUS | Status: DC | PRN
  Administered 2017-12-16 – 2017-12-18 (×6): 2 mg via INTRAVENOUS
  Administered 2017-12-18 – 2017-12-22 (×2): 4 mg via INTRAVENOUS
  Filled 2017-12-16: qty 2
  Filled 2017-12-16: qty 1
  Filled 2017-12-16: qty 2
  Filled 2017-12-16 (×4): qty 1
  Filled 2017-12-16: qty 2
  Filled 2017-12-16: qty 1

## 2017-12-16 MED ORDER — CEFAZOLIN SODIUM-DEXTROSE 2-4 GM/100ML-% IV SOLN
INTRAVENOUS | Status: AC
  Filled 2017-12-16: qty 100

## 2017-12-16 MED ORDER — SODIUM CHLORIDE 0.9 % IR SOLN
Status: DC | PRN
  Administered 2017-12-16: 18000 mL via INTRAVESICAL

## 2017-12-16 MED ORDER — DEXAMETHASONE SODIUM PHOSPHATE 4 MG/ML IJ SOLN
INTRAMUSCULAR | Status: DC | PRN
  Administered 2017-12-16: 5 mg via INTRAVENOUS

## 2017-12-16 MED ORDER — OXYCODONE-ACETAMINOPHEN 10-325 MG PO TABS: 1.0000 | ORAL_TABLET | ORAL | Status: DC | PRN

## 2017-12-16 MED ORDER — FENTANYL CITRATE (PF) 100 MCG/2ML IJ SOLN
INTRAMUSCULAR | Status: DC | PRN
  Administered 2017-12-16: 50 ug via INTRAVENOUS

## 2017-12-16 SURGICAL SUPPLY — 22 items
BAG URINE DRAINAGE (UROLOGICAL SUPPLIES) ×4 IMPLANT
BAG URO CATCHER STRL LF (MISCELLANEOUS) ×4 IMPLANT
CATH FOLEY 3WAY 30CC 22FR (CATHETERS) ×4 IMPLANT
CLOTH BEACON ORANGE TIMEOUT ST (SAFETY) ×4 IMPLANT
FIBER LASER FLEXIVA 1000 (UROLOGICAL SUPPLIES) IMPLANT
FIBER LASER FLEXIVA 550 (UROLOGICAL SUPPLIES) IMPLANT
GLOVE BIOGEL M STRL SZ7.5 (GLOVE) ×4 IMPLANT
GOWN STRL REUS W/TWL LRG LVL3 (GOWN DISPOSABLE) ×8 IMPLANT
GOWN STRL REUS W/TWL XL LVL3 (GOWN DISPOSABLE) ×4 IMPLANT
HOLDER FOLEY CATH W/STRAP (MISCELLANEOUS) IMPLANT
IV NS IRRIG 3000ML ARTHROMATIC (IV SOLUTION) ×24 IMPLANT
LOOP CUT BIPOLAR 24F LRG (ELECTROSURGICAL) ×4 IMPLANT
MANIFOLD NEPTUNE II (INSTRUMENTS) ×4 IMPLANT
PACK CYSTO (CUSTOM PROCEDURE TRAY) ×4 IMPLANT
PIN SAFETY STERILE (MISCELLANEOUS) ×4 IMPLANT
RUBBERBAND STERILE (MISCELLANEOUS) ×4 IMPLANT
SET ASPIRATION TUBING (TUBING) IMPLANT
SYR 30ML LL (SYRINGE) ×4 IMPLANT
SYRINGE IRR TOOMEY STRL 70CC (SYRINGE) ×4 IMPLANT
TUBING CONNECTING 10 (TUBING) ×3 IMPLANT
TUBING CONNECTING 10' (TUBING) ×1
TUBING UROLOGY SET (TUBING) ×4 IMPLANT
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
## 2017-12-16 NOTE — Anesthesia Preprocedure Evaluation (Signed)
Anesthesia Evaluation  Patient identified by MRN, date of birth, ID band Patient awake    Reviewed: Allergy & Precautions, NPO status , Patient's Chart, lab work & pertinent test results  Airway Mallampati: II  TM Distance: >3 FB Neck ROM: Full    Dental no notable dental hx. (+) Missing   Pulmonary Current Smoker,    Pulmonary exam normal breath sounds clear to auscultation       Cardiovascular hypertension, + CAD and + Cardiac Stents  Normal cardiovascular exam+ Valvular Problems/Murmurs (Moderate to Severe) AS  Rhythm:Regular Rate:Normal     Neuro/Psych Neuropathy bil  negative psych ROS   GI/Hepatic negative GI ROS, Neg liver ROS,   Endo/Other  negative endocrine ROS  Renal/GU Renal InsufficiencyRenal disease  negative genitourinary   Musculoskeletal negative musculoskeletal ROS (+)   Abdominal   Peds negative pediatric ROS (+)  Hematology negative hematology ROS (+)   Anesthesia Other Findings   Reproductive/Obstetrics negative OB ROS                             Anesthesia Physical Anesthesia Plan  ASA: III  Anesthesia Plan: General   Post-op Pain Management:    Induction: Intravenous  PONV Risk Score and Plan: 1 and Ondansetron and Treatment may vary due to age or medical condition  Airway Management Planned: LMA  Additional Equipment:   Intra-op Plan:   Post-operative Plan: Extubation in OR  Informed Consent: I have reviewed the patients History and Physical, chart, labs and discussed the procedure including the risks, benefits and alternatives for the proposed anesthesia with the patient or authorized representative who has indicated his/her understanding and acceptance.   Dental advisory given  Plan Discussed with: CRNA  Anesthesia Plan Comments:         Anesthesia Quick Evaluation

## 2017-12-16 NOTE — Anesthesia Procedure Notes (Signed)
Procedure Name: LMA Insertion Date/Time: 12/16/2017 1:46 PM Performed by: Lieutenant Diego, CRNA Pre-anesthesia Checklist: Patient identified, Emergency Drugs available, Suction available and Patient being monitored Patient Re-evaluated:Patient Re-evaluated prior to induction Oxygen Delivery Method: Circle system utilized Preoxygenation: Pre-oxygenation with 100% oxygen Induction Type: IV induction Ventilation: Mask ventilation without difficulty LMA: LMA inserted LMA Size: 5.0 Number of attempts: 1 Placement Confirmation: positive ETCO2 and breath sounds checked- equal and bilateral Tube secured with: Tape Dental Injury: Teeth and Oropharynx as per pre-operative assessment

## 2017-12-16 NOTE — Progress Notes (Addendum)
Upon approach, pt's catheter showed red urine with noticeable blood clots. Attempted to increase rate on CBI but no urine return noted in catheter tubing. NP on call paged and verbal order for hand irrigation given. 93mL sterile water instilled with no return. Bladder scan performed revealing 325 mL. Pt crying out in pain and c/o "I feel like I need to pee." NP on call paged and verbal order given to replace catheter and reconnect to CBI. Catheter replaced with good return. No clots noted at this time. Pt verbalizes relief of pressure. Will continue to monitor.

## 2017-12-16 NOTE — Plan of Care (Signed)

## 2017-12-16 NOTE — Op Note (Signed)
Operative Note  Preoperative diagnosis:  1.  2 cm bladder stone 2.  BPH with bladder outlet obstruction  Postoperative diagnosis: 1.  2 cm bladder stone 2.  BPH with bladder outlet obstruction  Procedure(s): 1.  Cystolitholapaxy 2.  Bipolar TURP  Surgeon: [name], MD  Assistants: None  Anesthesia: General  Complications: None  EBL: 50 mL  Specimens: 1.  Bladder stone 2.  Prostate chips  Drains/Catheters: 1.  22 French three-way Foley catheter with 30 mL in the balloon  Intraoperative findings:   1. 2 cm bladder stone 2. Trilobar prostatic urethral obstruction  Indication:  Matthew [name] is a 72 y.o. male  referred by the [facility] for treatment of his newly found bladder stones. He states that he bladder stones were discovered incidentally on a recent CT scan, which also noted a left renal mass that is being addressed by his [place] urologist Dr. Lenis Dickinson.   [name] is here today after finally getting clearance for cystolithalopaxy and TURP.   From a urinary standpoint, he reports a weak FOS and feels like he isn't emptying his bladder well. He has daily episodes of urgency/frequency with post-void dribbling. Nocturia x 1-2. Denies interval UTIs, dysuria or hematuria.   Description of procedure:  After informed consent was obtained, the patient was brought to the operating room and general anesthesia was administered. The patient was then placed in the dorsolithotomy position and prepped and draped in usual sterile fashion. A timeout was performed. A 23 French rigid cystoscope was then inserted into the urethral meatus and advanced into the bladder under direct vision. A complete bladder survey revealed a 2 cm bladder stone with no intravesical pathology.  Both ureteral orifices were identified and well away from the bladder neck.  A stone crusher device was used to fracture the bladder stone into numerous smaller fragments, which were then drained from the bladder.    The rigid cystoscope was then exchanged for a 26 French resectoscope with a bipolar loop working element.  Starting at the bladder neck and progressing distally to the verumontanum, the prostatic adenoma was systematically resected until a widely patent prostatic urethral channel was created.  All prostate chips were then hand irrigated out of the bladder and sent to pathology for permanent section.  The resectoscope was then removed and exchanged for a 22 French three-way Foley catheter.  The three-way Foley catheter was then extensively hand irrigated until the irrigant returned clear to light pink.  The catheter was then placed to continuous bladder irrigation and placed on rubber band traction.  He tolerated the procedure well and was transferred to the postanesthesia unit in stable condition.   Plan:  CBI and traction overnight

## 2017-12-16 NOTE — Transfer of Care (Signed)
Immediate Anesthesia Transfer of Care Note  [name]  Procedure(s) Performed: CYSTOSCOPY WITH LITHOLAPAXY (N/A Bladder) TRANSURETHRAL RESECTION OF THE PROSTATE (TURP)/ BIPOLAR (N/A Prostate)  Patient Location: PACU  Anesthesia Type:General  Level of Consciousness: sedated  Airway & Oxygen Therapy: Patient Spontanous Breathing and Patient connected to face mask oxygen  Post-op Assessment: Report given to RN and Post -op Vital signs reviewed and stable  Post vital signs: Reviewed and stable  Last Vitals:  Vitals Value Taken Time  BP    Temp    Pulse 81 12/16/2017  2:41 PM  Resp 14 12/16/2017  2:41 PM  SpO2 100 % 12/16/2017  2:41 PM  Vitals shown include unvalidated device data.  Last Pain:  Vitals:   12/16/17 1038  TempSrc:   PainSc: 0-No pain      Patients Stated Pain Goal: 4 (11/02/14 0109)  Complications: No apparent anesthesia complications

## 2017-12-17 ENCOUNTER — Encounter (HOSPITAL_COMMUNITY): Payer: Self-pay | Admitting: Urology

## 2017-12-17 LAB — BASIC METABOLIC PANEL
Anion gap: 10 (ref 5–15)
BUN: 20 mg/dL (ref 8–23)
CHLORIDE: 108 mmol/L (ref 98–111)
CO2: 24 mmol/L (ref 22–32)
Calcium: 8.7 mg/dL — ABNORMAL LOW (ref 8.9–10.3)
Creatinine, Ser: 2.17 mg/dL — ABNORMAL HIGH (ref 0.61–1.24)
GFR, EST AFRICAN AMERICAN: 33 mL/min — AB (ref >60)
GFR, EST NON AFRICAN AMERICAN: 28 mL/min — AB (ref >60)
Glucose, Bld: 155 mg/dL — ABNORMAL HIGH (ref 70–99)
POTASSIUM: 4.4 mmol/L (ref 3.5–5.1)
Sodium: 142 mmol/L (ref 135–145)

## 2017-12-17 LAB — CBC
HEMATOCRIT: 31.9 % — AB (ref 39.0–52.0)
Hemoglobin: 11 g/dL — ABNORMAL LOW (ref 13.0–17.0)
MCH: 30.8 pg (ref 26.0–34.0)
MCHC: 34.5 g/dL (ref 30.0–36.0)
MCV: 89.4 fL (ref 78.0–100.0)
PLATELETS: 214 10*3/uL (ref 150–400)
RBC: 3.57 MIL/uL — AB (ref 4.22–5.81)
RDW: 12.3 % (ref 11.5–15.5)
WBC: 11.6 10*3/uL — ABNORMAL HIGH (ref 4.0–10.5)

## 2017-12-17 MED ORDER — PHENAZOPYRIDINE HCL 200 MG PO TABS: 200.0000 mg | ORAL_TABLET | Freq: Three times a day (TID) | ORAL | Status: DC | PRN

## 2017-12-17 MED ORDER — OXYBUTYNIN CHLORIDE 5 MG PO TABS
5.0000 mg | ORAL_TABLET | Freq: Three times a day (TID) | ORAL | Status: DC | PRN
  Administered 2017-12-17 – 2017-12-31 (×4): 5 mg via ORAL
  Filled 2017-12-17 (×7): qty 1

## 2017-12-17 MED ORDER — PHENAZOPYRIDINE HCL 200 MG PO TABS: 200.0000 mg | ORAL_TABLET | Freq: Three times a day (TID) | ORAL | 0 refills | Status: DC | PRN

## 2017-12-17 MED ORDER — CEPHALEXIN 500 MG PO CAPS: 500.0000 mg | ORAL_CAPSULE | Freq: Three times a day (TID) | ORAL | 0 refills | Status: DC

## 2017-12-17 MED ORDER — OXYBUTYNIN CHLORIDE 5 MG PO TABS: 5.0000 mg | ORAL_TABLET | Freq: Three times a day (TID) | ORAL | 1 refills | Status: DC | PRN

## 2017-12-17 MED ORDER — PENTOSAN POLYSULFATE SODIUM 100 MG PO CAPS
100.0000 mg | ORAL_CAPSULE | Freq: Three times a day (TID) | ORAL | Status: DC | PRN
  Administered 2017-12-31: 100 mg via ORAL
  Filled 2017-12-17 (×4): qty 1

## 2017-12-17 NOTE — Anesthesia Postprocedure Evaluation (Signed)
Anesthesia Post Note  [name]  Procedure(s) Performed: CYSTOSCOPY WITH LITHOLAPAXY (N/A Bladder) TRANSURETHRAL RESECTION OF THE PROSTATE (TURP)/ BIPOLAR (N/A Prostate)     Patient location during evaluation: PACU Anesthesia Type: General Level of consciousness: awake and alert Pain management: pain level controlled Vital Signs Assessment: post-procedure vital signs reviewed and stable Respiratory status: spontaneous breathing, nonlabored ventilation, respiratory function stable and patient connected to nasal cannula oxygen Cardiovascular status: blood pressure returned to baseline and stable Postop Assessment: no apparent nausea or vomiting Anesthetic complications: no    Last Vitals:  Vitals:   12/16/17 2203 12/17/17 0529  BP: (!) 141/73 138/74  Pulse: 88 69  Resp:  18  Temp:  36.8 C  SpO2:  98%    Last Pain:  Vitals:   12/17/17 0941  TempSrc:   PainSc: 4                  Montez Hageman

## 2017-12-17 NOTE — Progress Notes (Signed)
1 Day Post-Op Subjective: The patient's catheter was removed this morning, but despite almost a liter of fluid intake, he could not urinate.  Bladder scan showed 0, but the patient stated that he had the urge to void.  A 22 French three-way Foley catheter was reinserted with return of grossly bloody urine with several clots.  The nursing staff attempted to flush the catheter, but was unsuccessful.  I flushed the catheter for approximately 1 hour and cleared several large clots.  His urine is currently draining clear.  Objective: Vital signs in last 24 hours: Temp:  [98 F (36.7 C)-98.3 F (36.8 C)] 98 F (36.7 C) (08/15 1338) Pulse Rate:  [57-152] 57 (08/15 1338) Resp:  [16-18] 18 (08/15 1338) BP: (124-162)/(58-110) 124/58 (08/15 1338) SpO2:  [98 %-100 %] 100 % (08/15 1338)  Intake/Output from previous day: 08/14 0701 - 08/15 0700 In: 22185 [P.O.:360; I.V.:1125] Out: 23635 [Urine:23625; Blood:10]  Intake/Output this shift: Total I/O In: 1650 [Other:1650] Out: 3665 [Urine:3665]  Physical Exam:  General: Alert and oriented CV: RRR, palpable distal pulses Lungs: CTAB, equal chest rise Abdomen: Soft, NTND, no rebound or guarding GU: 22 French three-way Foley catheter in place and draining clear irrigant with CBI Ext: NT, No erythema  Lab Results: Recent Labs    12/17/17 0410  HGB 11.0*  HCT 31.9*   BMET Recent Labs    12/17/17 0410  NA 142  K 4.4  CL 108  CO2 24  GLUCOSE 155*  BUN 20  CREATININE 2.17*  CALCIUM 8.7*     Studies/Results: No results found.  Assessment/Plan: Postop day 1 status post TURP  -Patient failed his voiding trial today and had gross hematuria with several clots.  His a 33 French catheter is now draining clear and his catheter is irrigating without difficulty.  Plan to watch the patient overnight with CBI.   LOS: 0 days   [name], MD [facility] Pager: [phone]  12/17/2017, 6:20 PM

## 2017-12-17 NOTE — Progress Notes (Signed)
Notified MD office of current status with voiding since Foley removed earlier today. Urine remains frank bloody in color. One large clot noted with first urination. Bladder scan shows 0 cc urine in bladder. No distention noted. C/O burning with urination only. Patient taking po fluids well. Order to place 22 Fr 3-way foley and irrigate obtained. MD to follow up this evening. Eulas Post, RN

## 2017-12-18 ENCOUNTER — Observation Stay (HOSPITAL_COMMUNITY): Payer: No Typology Code available for payment source

## 2017-12-18 ENCOUNTER — Encounter (HOSPITAL_COMMUNITY): Payer: Self-pay | Admitting: Nurse Practitioner

## 2017-12-18 ENCOUNTER — Inpatient Hospital Stay (HOSPITAL_COMMUNITY): Payer: No Typology Code available for payment source

## 2017-12-18 DIAGNOSIS — D62 Acute posthemorrhagic anemia: Secondary | ICD-10-CM | POA: Diagnosis not present

## 2017-12-18 DIAGNOSIS — I340 Nonrheumatic mitral (valve) insufficiency: Secondary | ICD-10-CM | POA: Diagnosis not present

## 2017-12-18 DIAGNOSIS — I25119 Atherosclerotic heart disease of native coronary artery with unspecified angina pectoris: Secondary | ICD-10-CM | POA: Diagnosis present

## 2017-12-18 DIAGNOSIS — R079 Chest pain, unspecified: Secondary | ICD-10-CM | POA: Diagnosis not present

## 2017-12-18 DIAGNOSIS — N183 Chronic kidney disease, stage 3 (moderate): Secondary | ICD-10-CM | POA: Diagnosis present

## 2017-12-18 DIAGNOSIS — I6521 Occlusion and stenosis of right carotid artery: Secondary | ICD-10-CM | POA: Diagnosis present

## 2017-12-18 DIAGNOSIS — I25110 Atherosclerotic heart disease of native coronary artery with unstable angina pectoris: Secondary | ICD-10-CM | POA: Diagnosis not present

## 2017-12-18 DIAGNOSIS — I10 Essential (primary) hypertension: Secondary | ICD-10-CM | POA: Diagnosis not present

## 2017-12-18 DIAGNOSIS — I739 Peripheral vascular disease, unspecified: Secondary | ICD-10-CM | POA: Diagnosis not present

## 2017-12-18 DIAGNOSIS — Z89412 Acquired absence of left great toe: Secondary | ICD-10-CM | POA: Diagnosis not present

## 2017-12-18 DIAGNOSIS — I129 Hypertensive chronic kidney disease with stage 1 through stage 4 chronic kidney disease, or unspecified chronic kidney disease: Secondary | ICD-10-CM | POA: Diagnosis present

## 2017-12-18 DIAGNOSIS — N138 Other obstructive and reflux uropathy: Secondary | ICD-10-CM | POA: Diagnosis present

## 2017-12-18 DIAGNOSIS — J449 Chronic obstructive pulmonary disease, unspecified: Secondary | ICD-10-CM | POA: Diagnosis present

## 2017-12-18 DIAGNOSIS — F17210 Nicotine dependence, cigarettes, uncomplicated: Secondary | ICD-10-CM | POA: Diagnosis present

## 2017-12-18 DIAGNOSIS — E1151 Type 2 diabetes mellitus with diabetic peripheral angiopathy without gangrene: Secondary | ICD-10-CM | POA: Diagnosis present

## 2017-12-18 DIAGNOSIS — E1142 Type 2 diabetes mellitus with diabetic polyneuropathy: Secondary | ICD-10-CM | POA: Diagnosis present

## 2017-12-18 DIAGNOSIS — Z01810 Encounter for preprocedural cardiovascular examination: Secondary | ICD-10-CM | POA: Diagnosis not present

## 2017-12-18 DIAGNOSIS — N210 Calculus in bladder: Secondary | ICD-10-CM | POA: Diagnosis present

## 2017-12-18 DIAGNOSIS — I350 Nonrheumatic aortic (valve) stenosis: Secondary | ICD-10-CM

## 2017-12-18 DIAGNOSIS — Z79899 Other long term (current) drug therapy: Secondary | ICD-10-CM | POA: Diagnosis not present

## 2017-12-18 DIAGNOSIS — Z951 Presence of aortocoronary bypass graft: Secondary | ICD-10-CM | POA: Diagnosis not present

## 2017-12-18 DIAGNOSIS — I214 Non-ST elevation (NSTEMI) myocardial infarction: Secondary | ICD-10-CM | POA: Diagnosis not present

## 2017-12-18 DIAGNOSIS — N401 Enlarged prostate with lower urinary tract symptoms: Secondary | ICD-10-CM | POA: Diagnosis present

## 2017-12-18 DIAGNOSIS — R310 Gross hematuria: Secondary | ICD-10-CM | POA: Diagnosis present

## 2017-12-18 DIAGNOSIS — I358 Other nonrheumatic aortic valve disorders: Secondary | ICD-10-CM | POA: Diagnosis not present

## 2017-12-18 DIAGNOSIS — E7800 Pure hypercholesterolemia, unspecified: Secondary | ICD-10-CM

## 2017-12-18 DIAGNOSIS — N289 Disorder of kidney and ureter, unspecified: Secondary | ICD-10-CM | POA: Diagnosis not present

## 2017-12-18 DIAGNOSIS — R339 Retention of urine, unspecified: Secondary | ICD-10-CM | POA: Diagnosis not present

## 2017-12-18 DIAGNOSIS — N2889 Other specified disorders of kidney and ureter: Secondary | ICD-10-CM | POA: Diagnosis present

## 2017-12-18 DIAGNOSIS — F4310 Post-traumatic stress disorder, unspecified: Secondary | ICD-10-CM | POA: Diagnosis present

## 2017-12-18 DIAGNOSIS — E1122 Type 2 diabetes mellitus with diabetic chronic kidney disease: Secondary | ICD-10-CM | POA: Diagnosis present

## 2017-12-18 DIAGNOSIS — I2510 Atherosclerotic heart disease of native coronary artery without angina pectoris: Secondary | ICD-10-CM | POA: Diagnosis not present

## 2017-12-18 DIAGNOSIS — Z952 Presence of prosthetic heart valve: Secondary | ICD-10-CM | POA: Diagnosis not present

## 2017-12-18 LAB — ECHOCARDIOGRAM COMPLETE
Height: 68 in
WEIGHTICAEL: 3216 [oz_av]

## 2017-12-18 LAB — TROPONIN I
TROPONIN I: 2.37 ng/mL — AB (ref <0.03)
TROPONIN I: 2.23 ng/mL — AB (ref <0.03)
Troponin I: 2.58 ng/mL (ref <0.03)

## 2017-12-18 LAB — CK TOTAL AND CKMB (NOT AT ARMC)
CK, MB: 9.9 ng/mL — ABNORMAL HIGH (ref 0.5–5.0)
Relative Index: 3.8 — ABNORMAL HIGH (ref 0.0–2.5)
Total CK: 258 U/L (ref 49–397)

## 2017-12-18 LAB — MRSA PCR SCREENING: MRSA by PCR: NEGATIVE

## 2017-12-18 LAB — HEPARIN LEVEL (UNFRACTIONATED): Heparin Unfractionated: 0.17 IU/mL — ABNORMAL LOW (ref 0.30–0.70)

## 2017-12-18 MED ORDER — LORAZEPAM 2 MG/ML IJ SOLN
0.5000 mg | Freq: Four times a day (QID) | INTRAMUSCULAR | Status: DC | PRN
  Administered 2017-12-18: 0.5 mg via INTRAVENOUS
  Filled 2017-12-18: qty 1

## 2017-12-18 MED ORDER — NITROGLYCERIN IN D5W 200-5 MCG/ML-% IV SOLN
INTRAVENOUS | Status: AC
  Administered 2017-12-18: 5 ug/min via INTRAVENOUS
  Filled 2017-12-18: qty 250

## 2017-12-18 MED ORDER — ASPIRIN EC 81 MG PO TBEC
81.0000 mg | DELAYED_RELEASE_TABLET | Freq: Once | ORAL | Status: AC
  Administered 2017-12-18: 81 mg via ORAL
  Filled 2017-12-18: qty 1

## 2017-12-18 MED ORDER — NITROGLYCERIN IN D5W 200-5 MCG/ML-% IV SOLN
0.0000 ug/min | INTRAVENOUS | Status: DC
  Administered 2017-12-18: 5 ug/min via INTRAVENOUS
  Administered 2017-12-19: 75 ug/min via INTRAVENOUS
  Administered 2017-12-20: 95 ug/min via INTRAVENOUS
  Administered 2017-12-20: 170 ug/min via INTRAVENOUS
  Filled 2017-12-18 (×4): qty 250

## 2017-12-18 MED ORDER — HEPARIN BOLUS VIA INFUSION
3000.0000 [IU] | Freq: Once | INTRAVENOUS | Status: DC
  Filled 2017-12-18: qty 3000

## 2017-12-18 MED ORDER — HEPARIN (PORCINE) IN NACL 100-0.45 UNIT/ML-% IJ SOLN
1650.0000 [IU]/h | INTRAMUSCULAR | Status: DC
  Administered 2017-12-18: 1100 [IU]/h via INTRAVENOUS
  Administered 2017-12-19 – 2017-12-21 (×4): 1450 [IU]/h via INTRAVENOUS
  Administered 2017-12-22: 1650 [IU]/h via INTRAVENOUS
  Filled 2017-12-18 (×6): qty 250

## 2017-12-18 MED ORDER — METOPROLOL TARTRATE 12.5 MG HALF TABLET
12.5000 mg | ORAL_TABLET | Freq: Two times a day (BID) | ORAL | Status: DC
  Administered 2017-12-18 – 2017-12-20 (×5): 12.5 mg via ORAL
  Filled 2017-12-18 (×5): qty 1

## 2017-12-18 MED ORDER — NITROGLYCERIN 0.4 MG SL SUBL
0.4000 mg | SUBLINGUAL_TABLET | SUBLINGUAL | Status: DC | PRN
  Administered 2017-12-18 (×6): 0.4 mg via SUBLINGUAL
  Filled 2017-12-18 (×2): qty 1

## 2017-12-18 MED ORDER — ATORVASTATIN CALCIUM 80 MG PO TABS
80.0000 mg | ORAL_TABLET | Freq: Every day | ORAL | Status: DC
  Administered 2017-12-18 – 2017-12-28 (×11): 80 mg via ORAL
  Filled 2017-12-18 (×6): qty 1
  Filled 2017-12-18 (×3): qty 2
  Filled 2017-12-18 (×2): qty 1

## 2017-12-18 MED ORDER — LACTATED RINGERS IV SOLN
INTRAVENOUS | Status: DC
  Administered 2017-12-18: 18:00:00 via INTRAVENOUS

## 2017-12-18 MED ORDER — NITROGLYCERIN 2 % TD OINT
1.0000 [in_us] | TOPICAL_OINTMENT | Freq: Four times a day (QID) | TRANSDERMAL | Status: DC
  Administered 2017-12-18 (×2): 1 [in_us] via TOPICAL
  Filled 2017-12-18 (×2): qty 30

## 2017-12-18 NOTE — Progress Notes (Signed)
Patient c/o "sharp" right sided chest pain. No radiation or SOB. Skin warm and dry. VS 493/55-21-74. O2 started at 2 l/min. Saturation is 98%. NTG given x3 per protocol. Pain decreased from 7 out of 10 to 2. EKG done and labs drawn. Will monitor closely. Eulas Post, RN

## 2017-12-18 NOTE — Progress Notes (Signed)
Called cardiology on call MD about new episode of chest pain. Alerted MD that RN gave 3 SL nitro tabs and 4 of morphine. MD ordered to start nitro gtt and DC nitro paste.   RN spoke with attending MD about pt's fluids and current situation. MD ordered to defer to cardiology and will DC all fluids at this time. MD also ordered PRN ativan for pt.   All orders carried out. Pt's current CP level at a 5 (down from 8). Will continue to monitor.

## 2017-12-18 NOTE — Progress Notes (Signed)
CRITICAL VALUE ALERT  Critical Value: Troponin 2.58  Date & Time Notied: 12/18/2017   Provider Notified: Dr. Lovena Neighbours  Orders Received/Actions taken: will place patient on telemetry and manage chest pain symptoms. Dr. Lovena Neighbours to consult Cardiology.

## 2017-12-18 NOTE — Progress Notes (Signed)
I spoke with Trish from cardiology concerning Matthew [name] chest pain as well as his EKG findings and elevated troponin.  They will be seeing him in consultation.

## 2017-12-18 NOTE — Progress Notes (Signed)
Patient had a second episode of right sided chest pain, states pain is 7/10 and sharp. Patient reports that at times chest pain is worse with deep breathes but that it is also constant with normal breathing. BP 180/76, HR 81, SpO2 98% on room air. Patient given pepto bismol and IV morphine 2mg  for chest pain symptoms. Awaiting troponin and CXR results. Patient also given scheduled BP meds at this time.  Matthew [name], Fraser Din 12/18/2017

## 2017-12-18 NOTE — Progress Notes (Signed)
Called regarding patient in [facility] ICU with chest pain and NSTEMI - plan delayed cath due to elevated creatinine. Chest pain ultimately resolved with nitrates and opioids. EKG personally reviewed, appears ischemic, but unchanged compared to prior EKG this am - no new ST elevations. Will d/c nitropaste and start nitroglycerin GTTS. Low threshold for cath if there is recurrent pain, despite elevated creatinine. Please don't hesitate to call the on call fellow overnight if this recurs.  Pixie Casino, MD, [facility] [place] Director of the Advanced Lipid Disorders &  Cardiovascular Risk Reduction Clinic Diplomate of the American Board of Clinical Lipidology Attending Cardiologist  Direct Dial: [phone]  Fax: [phone]  Website:  [email or web address]

## 2017-12-18 NOTE — Progress Notes (Addendum)
2 Days Post-Op Subjective: The pt c/o right sided chest pain that started around 0745 this AM.  The pain is non-radiating and dull in nature.  He denies SOB or abdominal pain.  Foley draining clear irrigant this AM.  He denies catheter discomfort.  Objective: Vital signs in last 24 hours: Temp:  [97.8 F (36.6 C)-98 F (36.7 C)] 97.9 F (36.6 C) (08/16 0451) Pulse Rate:  [57-83] 83 (08/16 0757) Resp:  [16-18] 18 (08/16 0451) BP: (117-173)/(58-86) 173/86 (08/16 0757) SpO2:  [92 %-100 %] 98 % (08/16 0757)  Intake/Output from previous day: 08/15 0701 - 08/16 0700 In: 9798.8 [P.O.:300; I.V.:648.8] Out: 44975 [Urine:10565]  Intake/Output this shift: Total I/O In: -  Out: 725 [Urine:725]  Physical Exam:  General: Alert and oriented CV: RRR, palpable distal pulses Lungs: CTAB, equal chest rise Abdomen: Soft, NTND, no rebound or guarding Gu: Foley draining clear irrigant Ext: NT, No erythema  Lab Results: Recent Labs    12/17/17 0410  HGB 11.0*  HCT 31.9*   BMET Recent Labs    12/17/17 0410  NA 142  K 4.4  CL 108  CO2 24  GLUCOSE 155*  BUN 20  CREATININE 2.17*  CALCIUM 8.7*     Studies/Results: No results found.  Assessment/Plan: POD 2 s/p TURP Hx of HTN, CAD, PVD, CKD, DM2  -Stat EKG, cardiac enzymes and chest x-ray ordered.  81 mg aspirin given.  Hx of CAD and PVD s/p femoral bypass -CBI stopped.  Restart PRN hematuria/clots -Dispo pending cardiac eval   LOS: 0 days   [name], MD [facility] Pager: [phone]  12/18/2017, 8:36 AM

## 2017-12-18 NOTE — Progress Notes (Signed)
Cardiology in to see pt. Orders obtained.  Awaiting ICU bed for transfer. Continue to monitor pt closely. Eulas Post, RN

## 2017-12-18 NOTE — Progress Notes (Signed)
  Echocardiogram 2D Echocardiogram has been performed.  Randa Lynn Kolby Schara 12/18/2017, 3:42 PM

## 2017-12-18 NOTE — Progress Notes (Signed)
Pt started complaining of Right chest pain at 1700. RN gave SL nitro tablet. Will continue to monitor.

## 2017-12-18 NOTE — Progress Notes (Signed)
ANTICOAGULATION CONSULT NOTE - follow up  Pharmacy Consult for IV HEPARIN Indication: chest pain/ACS  No Known Allergies  Patient Measurements: Height: 5\' 8"  (172.7 cm) Weight: 201 lb (91.2 kg) IBW/kg (Calculated) : 68.4 Heparin Dosing Weight: 87 kg  Vital Signs: Temp: 98 F (36.7 C) (08/16 1600) Temp Source: Oral (08/16 1600) BP: 164/69 (08/16 1830) Pulse Rate: 73 (08/16 1830)  Labs: Recent Labs    12/17/17 0410 12/18/17 0827 12/18/17 1401 12/18/17 1919  HGB 11.0*  --   --   --   HCT 31.9*  --   --   --   PLT 214  --   --   --   HEPARINUNFRC  --   --   --  0.17*  CREATININE 2.17*  --   --   --   CKTOTAL  --  258  --   --   CKMB  --  9.9*  --   --   TROPONINI  --  2.58* 2.37*  --     Estimated Creatinine Clearance: 33.2 mL/min (A) (by C-G formula based on SCr of 2.17 mg/dL (H)).   Medical History: Past Medical History:  Diagnosis Date  . Arthritis   . Bladder stones   . BPH (benign prostatic hyperplasia)   . CAD (coronary artery disease)    a. Pt reports prior stenting @ [facility] - no records in Epic.  Marland Kitchen Chronic kidney disease (CKD), stage III (moderate) (HCC)    dx from [place] in [place], [place]  . Heart murmur    a. AS  . Hyperlipidemia   . Hypertension   . Moderate to severe aortic stenosis    a. PER 01-15-16 [facility] .  Marland Kitchen Neuropathy    FEET AND HANDS  . Peripheral vascular disease ([place])    a. 04/2016 s/p L Fem->PT bypass; b. 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 04/2017 LLE Duplex: No restenosis. ABI R 0.63, ABI L 1.01.  Marland Kitchen Post traumatic stress disorder (PTSD)   . Pre-diabetes     Medications:  Scheduled:  . amLODipine  10 mg Oral q morning - 10a  . atorvastatin  80 mg Oral QHS  . doxazosin  2 mg Oral QHS  . gabapentin  600 mg Oral TID  . latanoprost  1 drop Both Eyes QHS  . metoprolol tartrate  12.5 mg Oral BID  . oxybutynin  10 mg Oral Daily    Assessment: Pharmacy is consulted to dose heparin in 72 yo male diagnosed with ACS.  Pt is POD2 s/p TURP. Pt with some hematuria and clots issues post-op. Per discussion with Dr. Oval Linsey avoid bolusing due to the bleeding issues pt has had. No listed anticoagulants. Pt with one time dose of aspirin 81 mg this AM. Atorvastatin increased to 80 mg PO daily   Today, 12/18/17  1st Heparin level subtherapeutic on current IV heparin rate of 1100 units/hr  Per RN, no bleeding and no problems with IV site  Goal of Therapy:  Heparin level 0.3-0.7 units/ml Monitor platelets by anticoagulation protocol: Yes   Plan:  1) Increase IV heparin from 1100 units/hr to 1350 units/hr 2) Recheck heparin level with AM labs 3) Daily heparin level and CBC   Adrian Saran, PharmD, BCPS Pager [phone] 12/18/2017 8:09 PM
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
## 2017-12-18 NOTE — Progress Notes (Signed)
ANTICOAGULATION CONSULT NOTE - Initial Consult  Pharmacy Consult for iv HEPARIN Indication: chest pain/ACS  No Known Allergies  Patient Measurements: Height: 5\' 8"  (172.7 cm) Weight: 201 lb (91.2 kg) IBW/kg (Calculated) : 68.4 Heparin Dosing Weight:   Vital Signs: Temp: 97.9 F (36.6 C) (08/16 0451) Temp Source: Oral (08/16 0451) BP: 152/62 (08/16 0946) Pulse Rate: 69 (08/16 0946)  Labs: Recent Labs    12/17/17 0410 12/18/17 0827  HGB 11.0*  --   HCT 31.9*  --   PLT 214  --   CREATININE 2.17*  --   TROPONINI  --  2.58*    Estimated Creatinine Clearance: 33.2 mL/min (A) (by C-G formula based on SCr of 2.17 mg/dL (H)).   Medical History: Past Medical History:  Diagnosis Date  . Arthritis   . Bladder stones   . BPH (benign prostatic hyperplasia)   . CAD (coronary artery disease)    a. Pt reports prior stenting @ [facility] - no records in Epic.  Marland Kitchen Chronic kidney disease (CKD), stage III (moderate) (HCC)    dx from [place] in [place], [place]  . Heart murmur    a. AS  . Hyperlipidemia   . Hypertension   . Moderate to severe aortic stenosis    a. [facility].  Marland Kitchen Neuropathy    FEET AND HANDS  . Peripheral vascular disease ([place])    a. 04/2016 s/p L Fem->PT bypass; b. 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 04/2017 LLE Duplex: No restenosis. ABI R 0.63, ABI L 1.01.  Marland Kitchen Post traumatic stress disorder (PTSD)   . Pre-diabetes     Medications:  Scheduled:  . amLODipine  10 mg Oral q morning - 10a  . atorvastatin  80 mg Oral QHS  . doxazosin  2 mg Oral QHS  . gabapentin  600 mg Oral TID  . latanoprost  1 drop Both Eyes QHS  . metoprolol tartrate  12.5 mg Oral BID  . nitroGLYCERIN  1 inch Topical Q6H  . oxybutynin  10 mg Oral Daily    Assessment: Pharmacy is consulted to dose heparin in 72 yo male diagnosed with ACS. Pt is POD2 s/p TURP. Pt with some hematuria and clots issues post-op. Per discussion with Dr. Oval Linsey avoid bolusing due to the  bleeding issues pt has had. No listed anticoagulants. Pt with one time dose of aspirin 81 mg this AM. Atorvastatin increased to 80 mg PO daily    Goal of Therapy:  Heparin level 0.3-0.7 units/ml Monitor platelets by anticoagulation protocol: Yes   Plan:   Start heparin infusion at 1100 units/hr   Heparin level 8 hours after start   Monitor for signs and symptoms of bleeding    Royetta Asal, PharmD, BCPS Pager [phone] 12/18/2017 11:06 AM

## 2017-12-18 NOTE — Progress Notes (Signed)
A call to [facility] was made to give update on pt. Pt use [facility], Dr. Rondell Reams (PCP), [place], [place] [phone] ext [phone], pager [phone].

## 2017-12-19 DIAGNOSIS — R079 Chest pain, unspecified: Secondary | ICD-10-CM

## 2017-12-19 LAB — CBC
HEMATOCRIT: 29.5 % — AB (ref 39.0–52.0)
Hemoglobin: 9.9 g/dL — ABNORMAL LOW (ref 13.0–17.0)
MCH: 30.5 pg (ref 26.0–34.0)
MCHC: 33.6 g/dL (ref 30.0–36.0)
MCV: 90.8 fL (ref 78.0–100.0)
Platelets: 195 10*3/uL (ref 150–400)
RBC: 3.25 MIL/uL — ABNORMAL LOW (ref 4.22–5.81)
RDW: 12.7 % (ref 11.5–15.5)
WBC: 8.4 10*3/uL (ref 4.0–10.5)

## 2017-12-19 LAB — HEPARIN LEVEL (UNFRACTIONATED)
HEPARIN UNFRACTIONATED: 0.45 [IU]/mL (ref 0.30–0.70)
Heparin Unfractionated: 0.29 IU/mL — ABNORMAL LOW (ref 0.30–0.70)

## 2017-12-19 NOTE — Progress Notes (Addendum)
ANTICOAGULATION CONSULT NOTE - follow up  Pharmacy Consult for IV HEPARIN Indication: chest pain/ACS  No Known Allergies  Patient Measurements: Height: 5\' 8"  (172.7 cm) Weight: 201 lb (91.2 kg) IBW/kg (Calculated) : 68.4 Heparin Dosing Weight: 87 kg  Vital Signs: Temp: 98.3 F (36.8 C) (08/17 0800) Temp Source: Oral (08/17 0800) BP: 139/73 (08/17 1630) Pulse Rate: 81 (08/17 1630)  Labs: Recent Labs    12/17/17 0410 12/18/17 0827 12/18/17 1401 12/18/17 1919 12/19/17 0311 12/19/17 1528  HGB 11.0*  --   --   --  9.9*  --   HCT 31.9*  --   --   --  29.5*  --   PLT 214  --   --   --  195  --   HEPARINUNFRC  --   --   --  0.17* 0.29* 0.45  CREATININE 2.17*  --   --   --   --   --   CKTOTAL  --  258  --   --   --   --   CKMB  --  9.9*  --   --   --   --   TROPONINI  --  2.58* 2.37* 2.23*  --   --    Estimated Creatinine Clearance: 33.2 mL/min (A) (by C-G formula based on SCr of 2.17 mg/dL (H)).  Medical History: Past Medical History:  Diagnosis Date  . Arthritis   . Bladder stones   . BPH (benign prostatic hyperplasia)   . CAD (coronary artery disease)    a. Pt reports prior stenting @ [facility] - no records in Epic.  Marland Kitchen Chronic kidney disease (CKD), stage III (moderate) (HCC)    dx from [place] in [place], [place]  . Heart murmur    a. AS  . Hyperlipidemia   . Hypertension   . Moderate to severe aortic stenosis    a. [facility]16 [facility] [place] [facility].  Marland Kitchen Neuropathy    FEET AND HANDS  . Peripheral vascular disease ([place])    a. 04/2016 s/p L Fem->PT bypass; b. 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 04/2017 LLE Duplex: No restenosis. ABI R 0.63, ABI L 1.01.  Marland Kitchen Post traumatic stress disorder (PTSD)   . Pre-diabetes     Medications:  Scheduled:  . amLODipine  10 mg Oral q morning - 10a  . atorvastatin  80 mg Oral QHS  . doxazosin  2 mg Oral QHS  . gabapentin  600 mg Oral TID  . latanoprost  1 drop Both Eyes QHS  . metoprolol tartrate  12.5 mg Oral BID  .  oxybutynin  10 mg Oral Daily    Assessment: Pharmacy is consulted to dose heparin in 72 yo male diagnosed with ACS. Pt is POD2 s/p TURP. Pt with some hematuria and clots issues post-op. Per discussion with Dr. Oval Linsey avoid bolusing due to the bleeding issues pt has had. No listed anticoagulants. Pt with one time dose of aspirin 81 mg this AM. Atorvastatin increased to 80 mg PO daily   Today, 12/19/17   3rd Heparin level = 0.45 units/ml on 1450 units/hr, now in therapeutic range  Heparin rate increased twice, no boluses s/p TURP  CBI discontinued, urine ~clear per Urology note this am, still pink this afternoon per RN, no clots  Goal of Therapy:  Heparin level 0.3-0.7 units/ml Monitor platelets by anticoagulation protocol: Yes   Plan:   Continue Heparin infusion at 1450 units/hr  Next Heparin level 75mn, Daily Heparin level  when at steady state  Daily CBC x 5, last scheduled 8/21  Minda Ditto PharmD Pager [phone] 12/19/2017, 4:50 PM

## 2017-12-19 NOTE — Progress Notes (Signed)
ANTICOAGULATION CONSULT NOTE - Follow Up Consult  Pharmacy Consult for Heparin Indication: chest pain/ACS  No Known Allergies  Patient Measurements: Height: 5\' 8"  (172.7 cm) Weight: 201 lb (91.2 kg) IBW/kg (Calculated) : 68.4 Heparin Dosing Weight:   Vital Signs: Temp: 98.8 F (37.1 C) (08/17 0449) Temp Source: Oral (08/17 0449) BP: 130/63 (08/17 0200) Pulse Rate: 67 (08/17 0300)  Labs: Recent Labs    12/17/17 0410 12/18/17 0827 12/18/17 1401 12/18/17 1919 12/19/17 0311  HGB 11.0*  --   --   --   --   HCT 31.9*  --   --   --   --   PLT 214  --   --   --   --   HEPARINUNFRC  --   --   --  0.17* 0.29*  CREATININE 2.17*  --   --   --   --   CKTOTAL  --  258  --   --   --   CKMB  --  9.9*  --   --   --   TROPONINI  --  2.58* 2.37* 2.23*  --     Estimated Creatinine Clearance: 33.2 mL/min (A) (by C-G formula based on SCr of 2.17 mg/dL (H)).   Medications:  Infusions:  . heparin 1,350 Units/hr (12/19/17 0000)  . nitroGLYCERIN 45 mcg/min (12/19/17 0522)  . sodium chloride irrigation      Assessment: Patient with low heparin level.  No heparin issues per RN.  Goal of Therapy:  Heparin level 0.3-0.7 units/ml Monitor platelets by anticoagulation protocol: Yes   Plan:  Increase heparin to 1450 units/hr Recheck level at [address], [place] 12/19/2017,6:05 AM

## 2017-12-19 NOTE — Plan of Care (Signed)
  Problem: Pain Managment: Goal: General experience of comfort will improve Outcome: Progressing   

## 2017-12-19 NOTE — Progress Notes (Signed)
Progress Note  Patient Name: Matthew [name] Date of Encounter: 12/19/2017  Primary Cardiologist: No primary care provider on file.   Subjective   Chest pain has resolved, no SOB at rest, shoulder pain today.  Inpatient Medications    Scheduled Meds: . amLODipine  10 mg Oral q morning - 10a  . atorvastatin  80 mg Oral QHS  . doxazosin  2 mg Oral QHS  . gabapentin  600 mg Oral TID  . latanoprost  1 drop Both Eyes QHS  . metoprolol tartrate  12.5 mg Oral BID  . oxybutynin  10 mg Oral Daily   Continuous Infusions: . heparin 1,450 Units/hr (12/19/17 0800)  . nitroGLYCERIN 50 mcg/min (12/19/17 0739)  . sodium chloride irrigation     PRN Meds: acetaminophen, bismuth subsalicylate, diphenhydrAMINE **OR** diphenhydrAMINE, LORazepam, morphine injection, nitroGLYCERIN, ondansetron, opium-belladonna, oxybutynin, oxyCODONE-acetaminophen **AND** oxyCODONE, pentosan polysulfate   Vital Signs    Vitals:   12/19/17 0700 12/19/17 0730 12/19/17 0800 12/19/17 0830  BP: 121/61 (!) 140/57 122/60 137/63  Pulse: (!) 57 (!) 57 (!) 59 64  Resp: 16 14 14 14   Temp:   98.3 F (36.8 C)   TempSrc:   Oral   SpO2: 97% 97% 97% 96%  Weight:      Height:        Intake/Output Summary (Last 24 hours) at 12/19/2017 0900 Last data filed at 12/19/2017 0800 Gross per 24 hour  Intake 10598.59 ml  Output 7900 ml  Net 2698.59 ml   Filed Weights   12/16/17 1038  Weight: 91.2 kg    Telemetry    SR - Personally Reviewed  ECG    SR, inferolateral ischemia, unchanged from 10/05/2017 - Personally Reviewed  Physical Exam   GEN: No acute distress.   Neck: No JVD Cardiac: RRR, 5/6 holosystolic murmur, no S2, rubs, or gallops.  Respiratory: Clear to auscultation bilaterally. GI: Soft, nontender, non-distended  MS: No edema; No deformity. Neuro:  Nonfocal  Psych: Normal affect   Labs    Chemistry Recent Labs  Lab 12/14/17 1146 12/17/17 0410  NA 144 142  K 4.3 4.4  CL 108 108  CO2 27 24    GLUCOSE 91 155*  BUN 18 20  CREATININE 1.98* 2.17*  CALCIUM 9.6 8.7*  GFRNONAA 32* 28*  GFRAA 37* 33*  ANIONGAP 9 10     Hematology Recent Labs  Lab 12/14/17 1146 12/17/17 0410 12/19/17 0311  WBC 6.1 11.6* 8.4  RBC 4.65 3.57* 3.25*  HGB 14.2 11.0* 9.9*  HCT 41.3 31.9* 29.5*  MCV 88.8 89.4 90.8  MCH 30.5 30.8 30.5  MCHC 34.4 34.5 33.6  RDW 12.3 12.3 12.7  PLT 238 214 195    Cardiac Enzymes Recent Labs  Lab 12/18/17 0827 12/18/17 1401 12/18/17 1919  TROPONINI 2.58* 2.37* 2.23*   No results for input(s): TROPIPOC in the last 168 hours.   BNPNo results for input(s): BNP, PROBNP in the last 168 hours.   DDimer No results for input(s): DDIMER in the last 168 hours.   Radiology    Dg Chest Port 1 View  Result Date: 12/18/2017 CLINICAL DATA:  Chest pain EXAM: PORTABLE CHEST 1 VIEW COMPARISON:  09/17/2013 chest radiograph. FINDINGS: Stable cardiomediastinal silhouette with normal heart size. No pneumothorax. No pleural effusion. Lungs appear clear, with no acute consolidative airspace disease and no pulmonary edema. IMPRESSION: No active disease. Electronically Signed   By: Ilona Sorrel M.D.   On: 12/18/2017 09:27    Cardiac Studies  Patient Profile     72 y.o. male with a history of PVD, CAD, HTN, HL, CKD III, BPH, Mod-Sev Ao stenosis, and recent finding of bladder stones s/p cystolitholapxy and bipolar TURP, who is being seen today for the evaluation of chest pain/NSTEMI at the request of Dr. Lovena Neighbours.  Assessment & Plan    1.  Non-STEMI: Patient with prior history of peripheral arterial disease, hypertension, hyperlipidemia and prior reported history of cardiac stenting though I cannot find any record of this year.  He is status post cystolitholopaxy and bipolar TURP on August 14 and this morning developed right-sided sharp chest pain without associated symptoms.  ECG shows SR, inferolateral ischemia, unchanged from 10/05/2017. Troponin 2.58--> 2.23, on iv  Heparin, we will continue till cath. Continue NTG drip. Echo yesterday showed Normal LV size with moderate LV hypertrophy. EF 60-65%. Normal RV size and systolic function. Mild mitral regurgitation. Severe aortic stenosis. I will hydrate to prepare for cath on Monday.  2. Severe aortic stenosis - mean gradient 49 mHg, this will be evaluated at the time of the cardiac cath, most probably on Monday   3.  Bladder stone status post cystolitholopaxy and bipolar TURP: Postop course complicated by hematuria and clots.  He currently has a Foley in.  Urine light pink.  In the setting of above, we will be heparinizing and this has been discussed with urology.    4.  Essential hypertension: controlled  5.  Hyperlipidemia: increased atorvastatin to 80 mg.  6.  Stage III chronic kidney disease: Creatinine 2.17 this morning.  I am going to hold his lisinopril as above.  He is receiving saline at 75 cc an hour.  Watch for volume excess in the setting of above.  7.  Peripheral vascular disease: Status post left femoral to PT bypass in December 2017.  Stable vascular duplex and ABIs in December 2018.  He is followed closely by vascular surgery.  No significant claudication symptoms at home.  Continue aspirin and statin.  For questions or updates, please contact [place] Please consult [email or web address] for contact info under Cardiology/STEMI.     Signed, Ena Dawley, MD  12/19/2017, 9:00 AM

## 2017-12-19 NOTE — Progress Notes (Signed)
3 Days Post-Op   Subjective/Chief Complaint:  1 - Enlarged Prostate With Bladder Stones - s/p TURP + cystolithalopexy on 12/16/17 by Dr. Lovena Neighbours. Initially managed on bladder irrigation which has been titrated to off, foley remains in place given ongoing heparinization for MI and pending CV procedures.   2 - Moderate Risk Prostate Cancer - Gleason 7 adenocarcinoma in TURP chips 8/14. No prior PSA data for review. CT 08/2017 w/o pelvic adnopathy or bony lesions.   3 - Stage 3 Renal Insufficiency - Cr 1.7's at [facility]. CT 2019 w/o hydro.   4 - NSTEMI - new chest pain 8/16 AM with modest elevated tropI and no acute EKG canges c/w NSTEMI. Cardiology folowing and now on heparin and nitro gtt pending likley cath on Monday   Today "Matthew [name]' is stable. Urine remians amazingly clear despite heparin gtt. No progressive chest pain.   Objective: Vital signs in last 24 hours: Temp:  [98 F (36.7 C)-98.8 F (37.1 C)] 98.3 F (36.8 C) (08/17 0800) Pulse Rate:  [56-88] 64 (08/17 0830) Resp:  [12-23] 14 (08/17 0830) BP: (121-184)/(57-93) 137/63 (08/17 0830) SpO2:  [94 %-100 %] 96 % (08/17 0830) Last BM Date: 12/15/17  Intake/Output from previous day: 08/16 0701 - 08/17 0700 In: 10391.8 [P.O.:240; I.V.:1351.8] Out: 8625 [Urine:8625] Intake/Output this shift: Total I/O In: 476.2 [P.O.:240; I.V.:236.2] Out: 550 [Urine:550]  General appearance: alert, cooperative and appears stated age Eyes: negative Neck: supple, symmetrical, trachea midline Back: symmetric, no curvature. ROM normal. No CVA tenderness. Resp: non-labored on minimal [place] O2 Cardio: Nl rate GI: soft, non-tender; bowel sounds normal; no masses,  no organomegaly Male genitalia: normal, 3 way foley in place with medium yellow urine and scant debris in very slwo irrigaiton, this was titrated to off.  Extremities: extremities normal, atraumatic, no cyanosis or edema Skin: Skin color, texture, turgor normal. No rashes or lesions Lymph nodes:  Cervical, supraclavicular, and axillary nodes normal. Neurologic: Grossly normal  Lab Results:  Recent Labs    12/17/17 0410 12/19/17 0311  WBC 11.6* 8.4  HGB 11.0* 9.9*  HCT 31.9* 29.5*  PLT 214 195   BMET Recent Labs    12/17/17 0410  NA 142  K 4.4  CL 108  CO2 24  GLUCOSE 155*  BUN 20  CREATININE 2.17*  CALCIUM 8.7*   PT/INR No results for input(s): LABPROT, INR in the last 72 hours. ABG No results for input(s): PHART, HCO3 in the last 72 hours.  Invalid input(s): PCO2, PO2  Studies/Results: Dg Chest Port 1 View  Result Date: 12/18/2017 CLINICAL DATA:  Chest pain EXAM: PORTABLE CHEST 1 VIEW COMPARISON:  09/17/2013 chest radiograph. FINDINGS: Stable cardiomediastinal silhouette with normal heart size. No pneumothorax. No pleural effusion. Lungs appear clear, with no acute consolidative airspace disease and no pulmonary edema. IMPRESSION: No active disease. Electronically Signed   By: Ilona Sorrel M.D.   On: 12/18/2017 09:27    Anti-infectives: Anti-infectives (From admission, onward)   Start     Dose/Rate Route Frequency Ordered Stop   12/17/17 0000  cephALEXin (KEFLEX) 500 MG capsule     500 mg Oral 3 times daily 12/17/17 0810 12/20/17 2359   12/16/17 2200  ceFAZolin (ANCEF) IVPB 1 g/50 mL premix    Note to Pharmacy:  24 hours post-op prophylaxis   1 g 100 mL/hr over 30 Minutes Intravenous Every 8 hours 12/16/17 1614 12/17/17 1836   12/16/17 1015  ceFAZolin (ANCEF) IVPB 2g/100 mL premix     2 g 200 mL/hr  over 30 Minutes Intravenous  Once 12/16/17 1008 12/16/17 1351   12/16/17 1011  ceFAZolin (ANCEF) 2-4 GM/100ML-% IVPB    Note to Pharmacy:  [place] Session   : cabinet override      12/16/17 1011 12/16/17 1336      Assessment/Plan:  1 - Enlarged Prostate With Bladder Stones - now s/p TURP, hopefully this will provide lifelong relief of obstruction.   2 - Moderate Risk Prostate Cancer - new DX, given his comorbidity, surveillance may be most reasonable  options.   3 - Stage 3 Renal Insufficiency - appears medical renal disease. Agree with pre-cath hydration to minimzie renal contrast insult for cath on Monday.   4 - NSTEMI - greatly appreciate cardiology comangment. Continue heparin and nitro gtt.    [facility], [place]on Skillen 12/19/2017

## 2017-12-20 LAB — HEPARIN LEVEL (UNFRACTIONATED)
HEPARIN UNFRACTIONATED: 0.35 [IU]/mL (ref 0.30–0.70)
HEPARIN UNFRACTIONATED: 0.22 [IU]/mL — AB (ref 0.30–0.70)
Heparin Unfractionated: 0.36 IU/mL (ref 0.30–0.70)

## 2017-12-20 LAB — CBC
HCT: 29.2 % — ABNORMAL LOW (ref 39.0–52.0)
Hemoglobin: 10.3 g/dL — ABNORMAL LOW (ref 13.0–17.0)
MCH: 31.3 pg (ref 26.0–34.0)
MCHC: 35.3 g/dL (ref 30.0–36.0)
MCV: 88.8 fL (ref 78.0–100.0)
PLATELETS: 222 10*3/uL (ref 150–400)
RBC: 3.29 MIL/uL — ABNORMAL LOW (ref 4.22–5.81)
RDW: 12.4 % (ref 11.5–15.5)
WBC: 9.2 10*3/uL (ref 4.0–10.5)

## 2017-12-20 LAB — BASIC METABOLIC PANEL
Anion gap: 9 (ref 5–15)
BUN: 18 mg/dL (ref 8–23)
CO2: 23 mmol/L (ref 22–32)
Calcium: 8.7 mg/dL — ABNORMAL LOW (ref 8.9–10.3)
Chloride: 107 mmol/L (ref 98–111)
Creatinine, Ser: 1.83 mg/dL — ABNORMAL HIGH (ref 0.61–1.24)
GFR calc Af Amer: 41 mL/min — ABNORMAL LOW (ref >60)
GFR calc non Af Amer: 35 mL/min — ABNORMAL LOW (ref >60)
Glucose, Bld: 125 mg/dL — ABNORMAL HIGH (ref 70–99)
Potassium: 4.1 mmol/L (ref 3.5–5.1)
Sodium: 139 mmol/L (ref 135–145)

## 2017-12-20 MED ORDER — ISOSORBIDE MONONITRATE ER 60 MG PO TB24: 60.0000 mg | ORAL_TABLET | Freq: Every day | ORAL | Status: DC

## 2017-12-20 MED ORDER — CARVEDILOL 6.25 MG PO TABS: 6.2500 mg | ORAL_TABLET | Freq: Two times a day (BID) | ORAL | Status: DC

## 2017-12-20 MED ORDER — CARVEDILOL 12.5 MG PO TABS
12.5000 mg | ORAL_TABLET | Freq: Two times a day (BID) | ORAL | Status: DC
  Administered 2017-12-20 – 2017-12-22 (×5): 12.5 mg via ORAL
  Filled 2017-12-20 (×5): qty 1

## 2017-12-20 MED ORDER — ISOSORBIDE MONONITRATE ER 30 MG PO TB24
30.0000 mg | ORAL_TABLET | Freq: Every day | ORAL | Status: DC
  Administered 2017-12-20 – 2017-12-28 (×9): 30 mg via ORAL
  Filled 2017-12-20 (×10): qty 1

## 2017-12-20 MED ORDER — SODIUM CHLORIDE 0.9 % IV SOLN
INTRAVENOUS | Status: DC
  Administered 2017-12-20: 23:00:00 via INTRAVENOUS
  Administered 2017-12-20: 75 mL/h via INTRAVENOUS

## 2017-12-20 NOTE — Progress Notes (Signed)
ANTICOAGULATION CONSULT NOTE - Follow Up Consult  Pharmacy Consult for Heparin Indication: chest pain/ACS  No Known Allergies  Patient Measurements: Height: 5\' 8"  (172.7 cm) Weight: 201 lb (91.2 kg) IBW/kg (Calculated) : 68.4 Heparin Dosing Weight:   Vital Signs: Temp: 98.7 F (37.1 C) (08/17 2345) Temp Source: Oral (08/17 2345) BP: 162/69 (08/18 0115) Pulse Rate: 83 (08/18 0115)  Labs: Recent Labs    12/17/17 0410 12/18/17 0827 12/18/17 1401  12/18/17 1919 12/19/17 0311 12/19/17 1528 12/20/17 0027 12/20/17 0029  HGB 11.0*  --   --   --   --  9.9*  --   --  10.3*  HCT 31.9*  --   --   --   --  29.5*  --   --  29.2*  PLT 214  --   --   --   --  195  --   --  222  HEPARINUNFRC  --   --   --    < > 0.17* 0.29* 0.45 0.22*  --   CREATININE 2.17*  --   --   --   --   --   --   --   --   CKTOTAL  --  258  --   --   --   --   --   --   --   CKMB  --  9.9*  --   --   --   --   --   --   --   TROPONINI  --  2.58* 2.37*  --  2.23*  --   --   --   --    < > = values in this interval not displayed.    Estimated Creatinine Clearance: 33.2 mL/min (A) (by C-G formula based on SCr of 2.17 mg/dL (H)).   Medications:  Infusions:  . heparin 1,450 Units/hr (12/20/17 0113)  . nitroGLYCERIN 95 mcg/min (12/20/17 0116)  . sodium chloride irrigation      Assessment: Patient with low heparin level.  Per RN, they are two issues which could cause for lower heparin level.  1st, the line was detached for some length of time at start of RN shift ~1900, but RN reconnected and drip resumed.  However, RN also told me that around 2200 the heparin drip had issues and was not infusing correctly, RN again fixed issue but heparin was interrupted again.  Therefore, I will not consider the low heparin level as being at steady state.  Goal of Therapy:  Heparin level 0.3-0.7 units/ml Monitor platelets by anticoagulation protocol: Yes   Plan:  Continue heparin drip at current rate Recheck level at  0800  Tyler Deis, Shea Stakes [place] 12/20/2017,2:57 AM

## 2017-12-20 NOTE — Progress Notes (Signed)
Progress Note  Patient Name: Matthew [name] Date of Encounter: 12/20/2017  Primary Cardiologist: No primary care provider on file.   Subjective   No more chest pain, but hypertensive overnight with high NTG drip doses causing headaches.  Inpatient Medications    Scheduled Meds: . amLODipine  10 mg Oral q morning - 10a  . atorvastatin  80 mg Oral QHS  . carvedilol  6.25 mg Oral BID WC  . doxazosin  2 mg Oral QHS  . gabapentin  600 mg Oral TID  . isosorbide mononitrate  30 mg Oral Daily  . latanoprost  1 drop Both Eyes QHS  . oxybutynin  10 mg Oral Daily   Continuous Infusions: . sodium chloride    . heparin 1,450 Units/hr (12/20/17 0800)  . sodium chloride irrigation     PRN Meds: acetaminophen, bismuth subsalicylate, diphenhydrAMINE **OR** diphenhydrAMINE, LORazepam, morphine injection, nitroGLYCERIN, ondansetron, opium-belladonna, oxybutynin, oxyCODONE-acetaminophen **AND** oxyCODONE, pentosan polysulfate   Vital Signs    Vitals:   12/20/17 0400 12/20/17 0430 12/20/17 0445 12/20/17 0600  BP: (!) 155/65 (!) 161/60 (!) 160/72 (!) 172/82  Pulse: 68 67 75 80  Resp: 16 12 17 18   Temp:      TempSrc:      SpO2: 96% 96% 97% 96%  Weight:      Height:        Intake/Output Summary (Last 24 hours) at 12/20/2017 0854 Last data filed at 12/20/2017 0800 Gross per 24 hour  Intake 1916.36 ml  Output 2925 ml  Net -1008.64 ml   Filed Weights   12/16/17 1038  Weight: 91.2 kg    Telemetry    SR - Personally Reviewed  ECG    SR, inferolateral ischemia, unchanged from 10/05/2017 - Personally Reviewed  Physical Exam   GEN: No acute distress.   Neck: No JVD Cardiac: RRR, 5/6 holosystolic murmur, no S2, rubs, or gallops.  Respiratory: Clear to auscultation bilaterally. GI: Soft, nontender, non-distended  MS: No edema; No deformity. Neuro:  Nonfocal  Psych: Normal affect   Labs    Chemistry Recent Labs  Lab 12/14/17 1146 12/17/17 0410  NA 144 142  K 4.3 4.4    CL 108 108  CO2 27 24  GLUCOSE 91 155*  BUN 18 20  CREATININE 1.98* 2.17*  CALCIUM 9.6 8.7*  GFRNONAA 32* 28*  GFRAA 37* 33*  ANIONGAP 9 10     Hematology Recent Labs  Lab 12/17/17 0410 12/19/17 0311 12/20/17 0029  WBC 11.6* 8.4 9.2  RBC 3.57* 3.25* 3.29*  HGB 11.0* 9.9* 10.3*  HCT 31.9* 29.5* 29.2*  MCV 89.4 90.8 88.8  MCH 30.8 30.5 31.3  MCHC 34.5 33.6 35.3  RDW 12.3 12.7 12.4  PLT 214 195 222    Cardiac Enzymes Recent Labs  Lab 12/18/17 0827 12/18/17 1401 12/18/17 1919  TROPONINI 2.58* 2.37* 2.23*   No results for input(s): TROPIPOC in the last 168 hours.   BNPNo results for input(s): BNP, PROBNP in the last 168 hours.   DDimer No results for input(s): DDIMER in the last 168 hours.   Radiology    Dg Chest Port 1 View  Result Date: 12/18/2017 CLINICAL DATA:  Chest pain EXAM: PORTABLE CHEST 1 VIEW COMPARISON:  09/17/2013 chest radiograph. FINDINGS: Stable cardiomediastinal silhouette with normal heart size. No pneumothorax. No pleural effusion. Lungs appear clear, with no acute consolidative airspace disease and no pulmonary edema. IMPRESSION: No active disease. Electronically Signed   By: Janina Mayo.D.  On: 12/18/2017 09:27    Cardiac Studies     Patient Profile     72 y.o. male with a history of PVD, CAD, HTN, HL, CKD III, BPH, Mod-Sev Ao stenosis, and recent finding of bladder stones s/p cystolitholapxy and bipolar TURP, who is being seen today for the evaluation of chest pain/NSTEMI at the request of Dr. Lovena Neighbours.  Assessment & Plan    1.  Non-STEMI: Patient with prior history of peripheral arterial disease, hypertension, hyperlipidemia and prior reported history of cardiac stenting though I cannot find any record of this year.  He is status post cystolitholopaxy and bipolar TURP on August 14 and this morning developed right-sided sharp chest pain without associated symptoms.  ECG shows SR, inferolateral ischemia, unchanged from  10/05/2017. Troponin 2.58--> 2.23, on iv Heparin, we will continue till cath. Continue NTG drip. Echo yesterday showed Normal LV size with moderate LV hypertrophy. EF 60-65%. Normal RV size and systolic function. Mild mitral regurgitation. Severe aortic stenosis. I will hydrate to prepare for cath tomorrow, stat BMP as none since 12/17/17.  2. Severe aortic stenosis - mean gradient 49 mHg, this will be evaluated at the time of the cardiac cath, most probably on Monday   3.  Bladder stone status post cystolitholopaxy and bipolar TURP: Postop course complicated by hematuria and clots.  He currently has a Foley in.  Urine light pink.  In the setting of above, we will be heparinizing and this has been discussed with urology.    4.  Essential hypertension: uncontrolled, d/c NTG drip, start imdur 30 mg po daily, add carvedilol 6.25 mg PO BID. We will uptitrate as needed.  5.  Hyperlipidemia: increased atorvastatin to 80 mg.  6.  Stage III chronic kidney disease: Creatinine 2.17, we will repeat today.  7.  Peripheral vascular disease: Status post left femoral to PT bypass in December 2017.  Stable vascular duplex and ABIs in December 2018.  He is followed closely by vascular surgery.  No significant claudication symptoms at home.  Continue aspirin and statin.  For questions or updates, please contact [place] Please consult [email or web address] for contact info under Cardiology/STEMI.     Signed, Ena Dawley, MD  12/20/2017, 8:54 AM

## 2017-12-20 NOTE — Progress Notes (Signed)
ANTICOAGULATION CONSULT NOTE - follow up  Pharmacy Consult for IV HEPARIN Indication: chest pain/ACS  No Known Allergies  Patient Measurements: Height: 5\' 8"  (172.7 cm) Weight: 201 lb (91.2 kg) IBW/kg (Calculated) : 68.4 Heparin Dosing Weight: 87 kg  Vital Signs: Temp: 98.5 F (36.9 C) (08/18 0345) Temp Source: Oral (08/18 0345) BP: 172/82 (08/18 0600) Pulse Rate: 80 (08/18 0600)  Labs: Recent Labs    12/18/17 0827 12/18/17 1401  12/18/17 1919 12/19/17 0311 12/19/17 1528 12/20/17 0027 12/20/17 0029 12/20/17 0748  HGB  --   --   --   --  9.9*  --   --  10.3*  --   HCT  --   --   --   --  29.5*  --   --  29.2*  --   PLT  --   --   --   --  195  --   --  222  --   HEPARINUNFRC  --   --    < > 0.17* 0.29* 0.45 0.22*  --  0.36  CKTOTAL 258  --   --   --   --   --   --   --   --   CKMB 9.9*  --   --   --   --   --   --   --   --   TROPONINI 2.58* 2.37*  --  2.23*  --   --   --   --   --    < > = values in this interval not displayed.   Estimated Creatinine Clearance: 33.2 mL/min (A) (by C-G formula based on SCr of 2.17 mg/dL (H)).  Medical History: Past Medical History:  Diagnosis Date  . Arthritis   . Bladder stones   . BPH (benign prostatic hyperplasia)   . CAD (coronary artery disease)    a. Pt reports prior stenting @ [facility] - no records in Epic.  Marland Kitchen Chronic kidney disease (CKD), stage III (moderate) (HCC)    dx from [place] in [place]  . Heart murmur    a. AS  . Hyperlipidemia   . Hypertension   . Moderate to severe aortic stenosis    a. PER [facility]-15-16 [facility].  Marland Kitchen Neuropathy    FEET AND HANDS  . Peripheral vascular disease ([place])    a. 04/2016 s/p L Fem->PT bypass; b. 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 04/2017 LLE Duplex: No restenosis. ABI R 0.63, ABI L 1.01.  Marland Kitchen Post traumatic stress disorder (PTSD)   . Pre-diabetes     Medications:  Scheduled:  . amLODipine  10 mg Oral q morning - 10a  . atorvastatin  80 mg Oral QHS  .  doxazosin  2 mg Oral QHS  . gabapentin  600 mg Oral TID  . latanoprost  1 drop Both Eyes QHS  . metoprolol tartrate  12.5 mg Oral BID  . oxybutynin  10 mg Oral Daily    Assessment: Pharmacy is consulted to dose heparin in 72 yo male diagnosed with ACS. Pt is POD2 s/p TURP. Pt with some hematuria and clots issues post-op. Per discussion with Dr. Oval Linsey avoid bolusing due to the bleeding issues pt has had. No listed anticoagulants. Pt with one time dose of aspirin 81 mg this AM. Atorvastatin increased to 80 mg PO daily   Today, 12/20/17   Low heparin level overnight may have been due to IV site/infusion issues  Heparin level this  AM therpeutic (0.36) on heparin 1450 units/hr  CBG: Hgb 10.3 low but improved, Plts WNL  CBI off, urine clear as noted per RN this AM, no clots  Goal of Therapy:  Heparin level 0.3-0.7 units/ml Monitor platelets by anticoagulation protocol: Yes   Plan:   Continue Heparin infusion at 1450 units/hr  Recheck heparin level in 8hr to verify therapeutic  Daily heparin level and CBC  Noted plans for cardiac cath Monday  Peggyann Juba, PharmD, BCPS Pager: [phone] 12/20/2017, 8:33 AM

## 2017-12-20 NOTE — Progress Notes (Addendum)
ANTICOAGULATION CONSULT NOTE - follow up  Pharmacy Consult for IV HEPARIN Indication: chest pain/ACS  No Known Allergies  Patient Measurements: Height: 5\' 8"  (172.7 cm) Weight: 201 lb (91.2 kg) IBW/kg (Calculated) : 68.4 Heparin Dosing Weight: 87 kg  Vital Signs: BP: 150/64 (08/18 1500) Pulse Rate: 87 (08/18 0700)  Labs: Recent Labs    12/18/17 0827 12/18/17 1401  12/18/17 1919 12/19/17 0311  12/20/17 0027 12/20/17 0029 12/20/17 0748 12/20/17 0907 12/20/17 1536  HGB  --   --   --   --  9.9*  --   --  10.3*  --   --   --   HCT  --   --   --   --  29.5*  --   --  29.2*  --   --   --   PLT  --   --   --   --  195  --   --  222  --   --   --   HEPARINUNFRC  --   --    < > 0.17* 0.29*   < > 0.22*  --  0.36  --  0.35  CREATININE  --   --   --   --   --   --   --   --   --  1.83*  --   CKTOTAL 258  --   --   --   --   --   --   --   --   --   --   CKMB 9.9*  --   --   --   --   --   --   --   --   --   --   TROPONINI 2.58* 2.37*  --  2.23*  --   --   --   --   --   --   --    < > = values in this interval not displayed.   Estimated Creatinine Clearance: 39.4 mL/min (A) (by C-G formula based on SCr of 1.83 mg/dL (H)).  Medical History: Past Medical History:  Diagnosis Date  . Arthritis   . Bladder stones   . BPH (benign prostatic hyperplasia)   . CAD (coronary artery disease)    a. Pt reports prior stenting @ [facility] - no records in Epic.  Marland Kitchen Chronic kidney disease (CKD), stage III (moderate) (HCC)    dx from [place] in [place], [place]  . Heart murmur    a. AS  . Hyperlipidemia   . Hypertension   . Moderate to severe aortic stenosis    a. [facility]-16 [facility].  Marland Kitchen Neuropathy    FEET AND HANDS  . Peripheral vascular disease ([place])    a. 04/2016 s/p L Fem->PT bypass; b. 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 04/2017 LLE Duplex: No restenosis. ABI R 0.63, ABI L 1.01.  Marland Kitchen Post traumatic stress disorder (PTSD)   . Pre-diabetes     Medications:  Scheduled:   . amLODipine  10 mg Oral q morning - 10a  . atorvastatin  80 mg Oral QHS  . carvedilol  12.5 mg Oral BID WC  . doxazosin  2 mg Oral QHS  . gabapentin  600 mg Oral TID  . isosorbide mononitrate  30 mg Oral Daily  . latanoprost  1 drop Both Eyes QHS  . oxybutynin  10 mg Oral Daily    Assessment: Pharmacy is consulted to dose heparin in 72  yo male diagnosed with ACS. Pt is POD2 s/p TURP. Pt with some hematuria and clots issues post-op. Per discussion with Dr. Oval Linsey avoid bolusing due to the bleeding issues pt has had. No listed anticoagulants. Pt with one time dose of aspirin 81 mg this AM. Atorvastatin increased to 80 mg PO daily   Today, 12/20/17   Low heparin level overnight may have been due to IV site/infusion issues  Heparin level this AM therpeutic (0.36) on heparin 1450 units/hr  CBG: Hgb 10.3 low but improved, Plts WNL  CBI off, urine clear as noted per RN this AM, no clots  2nd shift update:  Heparin level @ 15:36 =  0.35 (therapeutic) on heparin 1450 units/hr  No complications noted  Goal of Therapy:  Heparin level 0.3-0.7 units/ml Monitor platelets by anticoagulation protocol: Yes   Plan:   Continue Heparin infusion at 1450 units/hr  Daily heparin level and CBC  Noted plans for cardiac cath Monday  Leone Haven, PharmD 12/20/2017, 4:16 PM

## 2017-12-20 NOTE — Progress Notes (Signed)
4 Days Post-Op   Subjective/Chief Complaint:  1 - Enlarged Prostate With Bladder Stones - s/p TURP + cystolithalopexy on 12/16/17 by Dr. Lovena Neighbours. Initially managed on bladder irrigation which has been titrated to off, foley remains in place given ongoing heparinization for MI and pending CV procedures. Urine remains clear today off of CBI  2 - Moderate Risk Prostate Cancer - Gleason 7 adenocarcinoma in TURP chips 8/14. No prior PSA data for review. CT 08/2017 w/o pelvic adnopathy or bony lesions.   3 - Stage 3 Renal Insufficiency - Cr 1.7's at [place]. CT 2019 w/o hydro.   4 - NSTEMI - new chest pain 8/16 AM with modest elevated tropI and no acute EKG changes c/w NSTEMI. Cardiology folowing and now on heparin and nitro gtt pending cath tomorrow. No complaints today.  Objective: Vital signs in last 24 hours: Temp:  [98 F (36.7 C)-98.7 F (37.1 C)] 98.5 F (36.9 C) (08/18 0345) Pulse Rate:  [59-99] 80 (08/18 0600) Resp:  [11-21] 18 (08/18 0600) BP: (105-172)/(43-127) 172/82 (08/18 0600) SpO2:  [95 %-100 %] 96 % (08/18 0600) Last BM Date: 12/15/17  Intake/Output from previous day: 08/17 0701 - 08/18 0700 In: 2055.9 [P.O.:960; I.V.:1095.9] Out: 2925 [Urine:2925] Intake/Output this shift: No intake/output data recorded.  General appearance: alert, cooperative and appears stated age Eyes: negative Neck: supple, symmetrical, trachea midline Resp: non-labored on [place] Cardio: Nl rate GI: grossly non-distended Male genitalia: normal, 3 way foley in place with medium yellow urine with CBI off Extremities: extremities normal, atraumatic, no cyanosis or edema Skin: Skin color, texture, turgor normal. No rashes or lesions Lymph nodes: Cervical, supraclavicular, and axillary nodes normal. Neurologic: Grossly normal  Lab Results:  Recent Labs    12/19/17 0311 12/20/17 0029  WBC 8.4 9.2  HGB 9.9* 10.3*  HCT 29.5* 29.2*  PLT 195 222   BMET No results for input(s): NA, K, CL, CO2,  GLUCOSE, BUN, CREATININE, CALCIUM in the last 72 hours. PT/INR No results for input(s): LABPROT, INR in the last 72 hours. ABG No results for input(s): PHART, HCO3 in the last 72 hours.  Invalid input(s): PCO2, PO2  Studies/Results: Dg Chest Port 1 View  Result Date: 12/18/2017 CLINICAL DATA:  Chest pain EXAM: PORTABLE CHEST 1 VIEW COMPARISON:  09/17/2013 chest radiograph. FINDINGS: Stable cardiomediastinal silhouette with normal heart size. No pneumothorax. No pleural effusion. Lungs appear clear, with no acute consolidative airspace disease and no pulmonary edema. IMPRESSION: No active disease. Electronically Signed   By: Ilona Sorrel M.D.   On: 12/18/2017 09:27    Anti-infectives: Anti-infectives (From admission, onward)   Start     Dose/Rate Route Frequency Ordered Stop   12/17/17 0000  cephALEXin (KEFLEX) 500 MG capsule     500 mg Oral 3 times daily 12/17/17 0810 12/20/17 2359   12/16/17 2200  ceFAZolin (ANCEF) IVPB 1 g/50 mL premix    Note to Pharmacy:  24 hours post-op prophylaxis   1 g 100 mL/hr over 30 Minutes Intravenous Every 8 hours 12/16/17 1614 12/17/17 1836   12/16/17 1015  ceFAZolin (ANCEF) IVPB 2g/100 mL premix     2 g 200 mL/hr over 30 Minutes Intravenous  Once 12/16/17 1008 12/16/17 1351   12/16/17 1011  ceFAZolin (ANCEF) 2-4 GM/100ML-% IVPB    Note to Pharmacy:  Waldron Session   : cabinet override      12/16/17 1011 12/16/17 1336      Assessment/Plan:  1 - Enlarged Prostate With Bladder Stones - now s/p TURP,  hopefully this will provide lifelong relief of obstruction. Urine remains clear yellow off of CBI  2 - Moderate Risk Prostate Cancer - new DX, given his comorbidity, surveillance may be most reasonable options.   3 - Stage 3 Renal Insufficiency - appears medical renal disease. Agree with pre-cath hydration to minimzie renal contrast insult for cath on Monday.   4 - NSTEMI - greatly appreciate cardiology comangment. Continue heparin and nitro gtt with  plans for cath tomorrow. NPO MN.   Matthew [name] Rob Bunting 12/20/2017

## 2017-12-20 NOTE — Progress Notes (Signed)
Paged Dr. Alveta Heimlich [facility] regarding nitro gtts not effective for  patient's BP, BP ranges 150's to 170's without chest pain. Now titrated up to 155 mcg. No new order per MD.  Will continue to monitor,  titrate per [facility] order and report to day RN.

## 2017-12-21 LAB — CBC
HCT: 28.9 % — ABNORMAL LOW (ref 39.0–52.0)
Hemoglobin: 9.9 g/dL — ABNORMAL LOW (ref 13.0–17.0)
MCH: 30.8 pg (ref 26.0–34.0)
MCHC: 34.3 g/dL (ref 30.0–36.0)
MCV: 90 fL (ref 78.0–100.0)
PLATELETS: 208 10*3/uL (ref 150–400)
RBC: 3.21 MIL/uL — ABNORMAL LOW (ref 4.22–5.81)
RDW: 12.5 % (ref 11.5–15.5)
WBC: 8.4 10*3/uL (ref 4.0–10.5)

## 2017-12-21 LAB — BASIC METABOLIC PANEL
Anion gap: 7 (ref 5–15)
BUN: 18 mg/dL (ref 8–23)
CALCIUM: 8.8 mg/dL — AB (ref 8.9–10.3)
CO2: 25 mmol/L (ref 22–32)
CREATININE: 1.68 mg/dL — AB (ref 0.61–1.24)
Chloride: 109 mmol/L (ref 98–111)
GFR calc Af Amer: 45 mL/min — ABNORMAL LOW (ref >60)
GFR calc non Af Amer: 39 mL/min — ABNORMAL LOW (ref >60)
GLUCOSE: 91 mg/dL (ref 70–99)
Potassium: 4.3 mmol/L (ref 3.5–5.1)
Sodium: 141 mmol/L (ref 135–145)

## 2017-12-21 LAB — HEPARIN LEVEL (UNFRACTIONATED): Heparin Unfractionated: 0.35 IU/mL (ref 0.30–0.70)

## 2017-12-21 MED ORDER — HEART ATTACK BOUNCING BOOK
Freq: Once | Status: AC
  Administered 2017-12-21: 1
  Filled 2017-12-21: qty 1

## 2017-12-21 MED ORDER — ASPIRIN 81 MG PO CHEW: 81.0000 mg | CHEWABLE_TABLET | ORAL | Status: DC

## 2017-12-21 MED ORDER — SODIUM CHLORIDE 0.9 % WEIGHT BASED INFUSION
1.0000 mL/kg/h | INTRAVENOUS | Status: DC
  Administered 2017-12-21: 1 mL/kg/h via INTRAVENOUS

## 2017-12-21 MED ORDER — SODIUM CHLORIDE 0.9% FLUSH
3.0000 mL | Freq: Two times a day (BID) | INTRAVENOUS | Status: DC
  Administered 2017-12-21: 3 mL via INTRAVENOUS

## 2017-12-21 MED ORDER — SODIUM CHLORIDE 0.9 % IV SOLN: 250.0000 mL | INTRAVENOUS | Status: DC | PRN

## 2017-12-21 MED ORDER — ASPIRIN 81 MG PO CHEW
81.0000 mg | CHEWABLE_TABLET | ORAL | Status: AC
  Administered 2017-12-21: 81 mg via ORAL
  Filled 2017-12-21: qty 1

## 2017-12-21 MED ORDER — SODIUM CHLORIDE 0.9% FLUSH: 3.0000 mL | INTRAVENOUS | Status: DC | PRN

## 2017-12-21 NOTE — Progress Notes (Signed)
Arrived from [place] from [employer] [facility]. Heparin infusing at 1450 units and NS at 91cc/hr

## 2017-12-21 NOTE — Progress Notes (Signed)
Day of Surgery Subjective: No issues over the past shift.  Denies chest pain.    Objective: Vital signs in last 24 hours: Temp:  [98.4 F (36.9 C)-99 F (37.2 C)] 98.4 F (36.9 C) (08/19 0701) Pulse Rate:  [56-83] 60 (08/19 1700) Resp:  [10-19] 16 (08/19 1700) BP: (138-172)/(51-114) 171/87 (08/19 1700) SpO2:  [94 %-98 %] 97 % (08/19 1700) Weight:  [84.3 kg] 84.3 kg (08/19 1558)  Intake/Output from previous day: 08/18 0701 - 08/19 0700 In: 2665.8 [P.O.:960; I.V.:1705.8] Out: 2850 [Urine:2850]  Intake/Output this shift: Total I/O In: 1695.3 [I.V.:1695.3] Out: 1350 [Urine:1350]  Physical Exam:  General: Alert and oriented CV: RRR, palpable distal pulses Lungs: CTAB, equal chest rise Abdomen: Soft, NTND, no rebound or guarding Gu: Foley draining clear-yellow urine Ext: NT, No erythema  Lab Results: Recent Labs    12/19/17 0311 12/20/17 0029 12/21/17 0332  HGB 9.9* 10.3* 9.9*  HCT 29.5* 29.2* 28.9*   BMET Recent Labs    12/20/17 0907 12/21/17 1244  NA 139 141  K 4.1 4.3  CL 107 109  CO2 23 25  GLUCOSE 125* 91  BUN 18 18  CREATININE 1.83* 1.68*  CALCIUM 8.7* 8.8*     Studies/Results: No results found.  Assessment/Plan: 1 - Enlarged Prostate With Bladder Stones - now s/p TURP, hopefully this will provide lifelong relief of obstruction. Urine remains clear yellow off of CBI  2 - Moderate Risk Prostate Cancer - new DX, given his comorbidity, surveillance may be most reasonable options.   3 - Stage 3 Renal Insufficiency - appears medical renal disease. IVF per cardiology following cath  4 - NSTEMI - greatly appreciate cardiology comangment. Continue heparin and nitro gtt with plans for cath today   LOS: 3 days   [name], MD [facility] Pager: [phone]  12/21/2017, 5:44 PM

## 2017-12-21 NOTE — Progress Notes (Signed)
Patient picked up by [facility]. Patient en route to [facility].

## 2017-12-21 NOTE — Care Management Note (Signed)
Case Management Note  Patient Details  Name: Matthew [name] MRN: [id number] Date of Birth: 1946/01/18  Subjective/Objective:                  No more chest pain, but hypertensive overnight with high NTG drip doses causing headaches  Action/Plan: [id number] drip dcd/switched po meds/ Will follow for progression and cm needs.  Expected Discharge Date:  12/17/17               Expected Discharge Plan:  Home/Self Care  In-House Referral:     Discharge planning Services  CM Consult  Post Acute Care Choice:    Choice offered to:     DME Arranged:    DME Agency:     HH Arranged:    HH Agency:     Status of Service:  Completed, signed off  If discussed at [employer] of Stay Meetings, dates discussed:    Additional Comments:  Leeroy Cha, RN 12/21/2017, 11:11 AM

## 2017-12-21 NOTE — Progress Notes (Signed)
ANTICOAGULATION CONSULT NOTE   Pharmacy Consult for heparin Indication: chest pain/ACS  No Known Allergies  Patient Measurements: Height: 5\' 8"  (172.7 cm) Weight: 201 lb (91.2 kg) IBW/kg (Calculated) : 68.4 Heparin Dosing Weight: 87 kg  Vital Signs: Temp: 98.4 F (36.9 C) (08/19 0701) Temp Source: Oral (08/19 0701) BP: 167/81 (08/19 0920) Pulse Rate: 63 (08/19 0920)  Labs: Recent Labs    12/18/17 1401  12/18/17 1919  12/19/17 0311  12/20/17 0029 12/20/17 0748 12/20/17 0907 12/20/17 1536 12/21/17 0332  HGB  --   --   --    < > 9.9*  --  10.3*  --   --   --  9.9*  HCT  --   --   --   --  29.5*  --  29.2*  --   --   --  28.9*  PLT  --   --   --   --  195  --  222  --   --   --  208  HEPARINUNFRC  --    < > 0.17*  --  0.29*   < >  --  0.36  --  0.35 0.35  CREATININE  --   --   --   --   --   --   --   --  1.83*  --   --   TROPONINI 2.37*  --  2.23*  --   --   --   --   --   --   --   --    < > = values in this interval not displayed.    Estimated Creatinine Clearance: 39.4 mL/min (A) (by C-G formula based on SCr of 1.83 mg/dL (H)).   Assessment: Patient's a 72 y.o M with hx PVD, CAD, HTN, and s/p TURP/ cystolithalopexy on 12/16/17 with noted CP and elevated troponin on 12/19/17. Heparin drip started on 8/17 for NSTEMI.  Today, 12/21/2017: - heparin level remains therapeutic this morning at 0.35 - cbc relatively stable - Per RN, urine is pink in color but it has been like this for the past few days.  No other bleeding noted. - Plan for cardiac cath today  Goal of Therapy:  Heparin level 0.3-0.7 units/ml Monitor platelets by anticoagulation protocol: Yes   Plan:  - continue heparin drip at 1450 units/hr - monitor for s/s bleeding - f/u pt after cath  Evita Merida P 12/21/2017,9:46 AM

## 2017-12-21 NOTE — Progress Notes (Signed)
Progress Note  Patient Name: Matthew [name] Date of Encounter: 12/21/2017  Primary Cardiologist: [facility]  Subjective   No chest pain or dyspnea  Inpatient Medications    Scheduled Meds: . amLODipine  10 mg Oral q morning - 10a  . atorvastatin  80 mg Oral QHS  . carvedilol  12.5 mg Oral BID WC  . doxazosin  2 mg Oral QHS  . gabapentin  600 mg Oral TID  . isosorbide mononitrate  30 mg Oral Daily  . latanoprost  1 drop Both Eyes QHS  . oxybutynin  10 mg Oral Daily  . sodium chloride flush  3 mL Intravenous Q12H   Continuous Infusions: . sodium chloride    . sodium chloride 1 mL/kg/hr (12/21/17 1058)  . heparin 1,450 Units/hr (12/21/17 0500)  . sodium chloride irrigation     PRN Meds: sodium chloride, acetaminophen, bismuth subsalicylate, diphenhydrAMINE **OR** diphenhydrAMINE, LORazepam, morphine injection, nitroGLYCERIN, ondansetron, opium-belladonna, oxybutynin, oxyCODONE-acetaminophen **AND** oxyCODONE, pentosan polysulfate, sodium chloride flush   Vital Signs    Vitals:   12/21/17 0920 12/21/17 1000 12/21/17 1053 12/21/17 1200  BP: (!) 167/81 (!) 143/51 (!) 143/51 (!) 163/61  Pulse: 63 (!) 57  (!) 59  Resp:  13  14  Temp:      TempSrc:      SpO2:  97%  97%  Weight:      Height:        Intake/Output Summary (Last 24 hours) at 12/21/2017 1401 Last data filed at 12/21/2017 0500 Gross per 24 hour  Intake 1504.37 ml  Output 2850 ml  Net -1345.63 ml   Filed Weights   12/16/17 1038  Weight: 91.2 kg    Telemetry    Sinus- Personally Reviewed   Physical Exam   GEN: No acute distress.   Neck: No JVD Cardiac: RRR, 3/6 systolic murmur Respiratory: Clear to auscultation bilaterally. GI: Soft, nontender, non-distended  MS: No edema Neuro:  Nonfocal  Psych: Normal affect   Labs    Chemistry Recent Labs  Lab 12/17/17 0410 12/20/17 0907 12/21/17 1244  NA 142 139 141  K 4.4 4.1 4.3  CL 108 107 109  CO2 24 23 25   GLUCOSE 155* 125* 91  BUN  20 18 18   CREATININE 2.17* 1.83* 1.68*  CALCIUM 8.7* 8.7* 8.8*  GFRNONAA 28* 35* 39*  GFRAA 33* 41* 45*  ANIONGAP 10 9 7      Hematology Recent Labs  Lab 12/19/17 0311 12/20/17 0029 12/21/17 0332  WBC 8.4 9.2 8.4  RBC 3.25* 3.29* 3.21*  HGB 9.9* 10.3* 9.9*  HCT 29.5* 29.2* 28.9*  MCV 90.8 88.8 90.0  MCH 30.5 31.3 30.8  MCHC 33.6 35.3 34.3  RDW 12.7 12.4 12.5  PLT 195 222 208    Cardiac Enzymes Recent Labs  Lab 12/18/17 0827 12/18/17 1401 12/18/17 1919  TROPONINI 2.58* 2.37* 2.23*    Patient Profile     72 y.o. male with a history of PVD, CAD, HTN, HL, CKD III, BPH, Mod-Sev Ao stenosis, and recent finding of bladder stones s/p cystolitholapxy and bipolar TURP, who is being seen for the evaluation ofchest pain/NSTEMIat the request of Dr. Lovena Neighbours.  Assessment & Plan    1 non-ST elevation myocardial infarction-patient remains pain-free.  For cardiac catheterization later today.  Continue aspirin, heparin, beta-blocker and statin.  2 severe aortic stenosis-noted on echocardiogram.  He will eventually require aortic valve replacement.  For cardiac catheterization today.  3 chronic stage III kidney disease-follow renal function closely  after procedure.  4 status post cystolitholopaxy and bipolar TURP-Per urology  5 hypertension-blood pressure remains mildly elevated.  We will follow and adjust regimen as needed.  6 hyperlipidemia-continue statin.   For questions or updates, please contact [place] Please consult [email or web address] for contact info under Cardiology/STEMI.      Signed, Kirk Ruths, MD  12/21/2017, 2:01 PM

## 2017-12-21 NOTE — Progress Notes (Signed)
Cath postphoned  until tomorrow . Eating [place] sandwich.Transferred to 6c. Family notified

## 2017-12-22 ENCOUNTER — Inpatient Hospital Stay (HOSPITAL_COMMUNITY): Payer: No Typology Code available for payment source

## 2017-12-22 ENCOUNTER — Encounter (HOSPITAL_COMMUNITY): Payer: Self-pay | Admitting: Cardiovascular Disease

## 2017-12-22 ENCOUNTER — Encounter (HOSPITAL_COMMUNITY)
Admission: RE | Disposition: A | Discharge status: Home / Self Care | Payer: Self-pay | Source: Ambulatory Visit | Attending: Urology

## 2017-12-22 ENCOUNTER — Other Ambulatory Visit: Payer: Self-pay | Admitting: *Deleted

## 2017-12-22 DIAGNOSIS — Z01810 Encounter for preprocedural cardiovascular examination: Secondary | ICD-10-CM

## 2017-12-22 DIAGNOSIS — I2510 Atherosclerotic heart disease of native coronary artery without angina pectoris: Secondary | ICD-10-CM

## 2017-12-22 DIAGNOSIS — I350 Nonrheumatic aortic (valve) stenosis: Secondary | ICD-10-CM

## 2017-12-22 HISTORY — PX: RIGHT/LEFT HEART CATH AND CORONARY ANGIOGRAPHY: CATH118266

## 2017-12-22 LAB — POCT I-STAT 3, ART BLOOD GAS (G3+)
Acid-base deficit: 2 mmol/L (ref 0.0–2.0)
Bicarbonate: 21.8 mmol/L (ref 20.0–28.0)
O2 SAT: 94 %
PCO2 ART: 34.5 mmHg (ref 32.0–48.0)
PH ART: 7.408 (ref 7.350–7.450)
PO2 ART: 70 mmHg — AB (ref 83.0–108.0)
TCO2: 23 mmol/L (ref 22–32)

## 2017-12-22 LAB — POCT I-STAT 3, VENOUS BLOOD GAS (G3P V)
ACID-BASE DEFICIT: 1 mmol/L (ref 0.0–2.0)
ACID-BASE DEFICIT: 2 mmol/L (ref 0.0–2.0)
BICARBONATE: 22.7 mmol/L (ref 20.0–28.0)
Bicarbonate: 23.3 mmol/L (ref 20.0–28.0)
O2 SAT: 62 %
O2 Saturation: 65 %
PCO2 VEN: 38.6 mmHg — AB (ref 44.0–60.0)
PH VEN: 7.386 (ref 7.250–7.430)
PH VEN: 7.389 (ref 7.250–7.430)
PO2 VEN: 33 mmHg (ref 32.0–45.0)
PO2 VEN: 34 mmHg (ref 32.0–45.0)
TCO2: 24 mmol/L (ref 22–32)
TCO2: 24 mmol/L (ref 22–32)
pCO2, Ven: 37.8 mmHg — ABNORMAL LOW (ref 44.0–60.0)

## 2017-12-22 LAB — HEPARIN LEVEL (UNFRACTIONATED)
HEPARIN UNFRACTIONATED: 0.2 [IU]/mL — AB (ref 0.30–0.70)
Heparin Unfractionated: 0.22 IU/mL — ABNORMAL LOW (ref 0.30–0.70)

## 2017-12-22 LAB — CBC
HCT: 28.6 % — ABNORMAL LOW (ref 39.0–52.0)
Hemoglobin: 9.4 g/dL — ABNORMAL LOW (ref 13.0–17.0)
MCH: 30.6 pg (ref 26.0–34.0)
MCHC: 32.9 g/dL (ref 30.0–36.0)
MCV: 93.2 fL (ref 78.0–100.0)
PLATELETS: 192 10*3/uL (ref 150–400)
RBC: 3.07 MIL/uL — AB (ref 4.22–5.81)
RDW: 12.1 % (ref 11.5–15.5)
WBC: 8 10*3/uL (ref 4.0–10.5)

## 2017-12-22 LAB — BASIC METABOLIC PANEL
Anion gap: 10 (ref 5–15)
BUN: 18 mg/dL (ref 8–23)
CALCIUM: 8.9 mg/dL (ref 8.9–10.3)
CO2: 22 mmol/L (ref 22–32)
Chloride: 105 mmol/L (ref 98–111)
Creatinine, Ser: 2.1 mg/dL — ABNORMAL HIGH (ref 0.61–1.24)
GFR calc Af Amer: 34 mL/min — ABNORMAL LOW (ref >60)
GFR, EST NON AFRICAN AMERICAN: 30 mL/min — AB (ref >60)
GLUCOSE: 102 mg/dL — AB (ref 70–99)
Potassium: 4.2 mmol/L (ref 3.5–5.1)
SODIUM: 137 mmol/L (ref 135–145)

## 2017-12-22 LAB — POCT ACTIVATED CLOTTING TIME: ACTIVATED CLOTTING TIME: 114 s

## 2017-12-22 SURGERY — RIGHT/LEFT HEART CATH AND CORONARY ANGIOGRAPHY
Anesthesia: LOCAL

## 2017-12-22 MED ORDER — HYDRALAZINE HCL 20 MG/ML IJ SOLN
INTRAMUSCULAR | Status: DC | PRN
  Administered 2017-12-22: 5 mg via INTRAVENOUS

## 2017-12-22 MED ORDER — LIDOCAINE HCL (PF) 1 % IJ SOLN
INTRAMUSCULAR | Status: AC
  Filled 2017-12-22: qty 30

## 2017-12-22 MED ORDER — ASPIRIN 81 MG PO CHEW
81.0000 mg | CHEWABLE_TABLET | ORAL | Status: AC
  Administered 2017-12-22: 07:00:00 81 mg via ORAL
  Filled 2017-12-22: qty 1

## 2017-12-22 MED ORDER — HEPARIN (PORCINE) IN NACL 1000-0.9 UT/500ML-% IV SOLN
INTRAVENOUS | Status: AC
  Filled 2017-12-22: qty 1000

## 2017-12-22 MED ORDER — SODIUM CHLORIDE 0.9 % IV SOLN: INTRAVENOUS | Status: AC

## 2017-12-22 MED ORDER — SODIUM CHLORIDE 0.9% FLUSH: 3.0000 mL | INTRAVENOUS | Status: DC | PRN

## 2017-12-22 MED ORDER — LIDOCAINE HCL (PF) 1 % IJ SOLN
INTRAMUSCULAR | Status: DC | PRN
  Administered 2017-12-22: 20 mL

## 2017-12-22 MED ORDER — HEPARIN (PORCINE) IN NACL 1000-0.9 UT/500ML-% IV SOLN
INTRAVENOUS | Status: DC | PRN
  Administered 2017-12-22 (×2): 500 mL

## 2017-12-22 MED ORDER — ATORVASTATIN CALCIUM 80 MG PO TABS: 80.0000 mg | ORAL_TABLET | Freq: Every day | ORAL | Status: DC

## 2017-12-22 MED ORDER — MORPHINE SULFATE (PF) 2 MG/ML IV SOLN
2.0000 mg | INTRAVENOUS | Status: DC | PRN
  Administered 2017-12-22 – 2017-12-23 (×2): 2 mg via INTRAVENOUS
  Filled 2017-12-22 (×2): qty 1

## 2017-12-22 MED ORDER — HEPARIN (PORCINE) IN NACL 100-0.45 UNIT/ML-% IJ SOLN
1750.0000 [IU]/h | INTRAMUSCULAR | Status: DC
  Administered 2017-12-22: 1650 [IU]/h via INTRAVENOUS
  Administered 2017-12-23 (×2): 1800 [IU]/h via INTRAVENOUS
  Administered 2017-12-24 – 2017-12-25 (×2): 1750 [IU]/h via INTRAVENOUS
  Filled 2017-12-22 (×4): qty 250

## 2017-12-22 MED ORDER — ONDANSETRON HCL 4 MG/2ML IJ SOLN: 4.0000 mg | Freq: Four times a day (QID) | INTRAMUSCULAR | Status: DC | PRN

## 2017-12-22 MED ORDER — IOPAMIDOL (ISOVUE-370) INJECTION 76%
INTRAVENOUS | Status: DC | PRN
  Administered 2017-12-22: 45 mL via INTRA_ARTERIAL

## 2017-12-22 MED ORDER — IOPAMIDOL (ISOVUE-370) INJECTION 76%
INTRAVENOUS | Status: AC
  Filled 2017-12-22: qty 100

## 2017-12-22 MED ORDER — SODIUM CHLORIDE 0.9% FLUSH
3.0000 mL | Freq: Two times a day (BID) | INTRAVENOUS | Status: DC
  Administered 2017-12-22 – 2017-12-28 (×10): 3 mL via INTRAVENOUS

## 2017-12-22 MED ORDER — SODIUM CHLORIDE 0.9 % IV SOLN
INTRAVENOUS | Status: DC
  Administered 2017-12-22: 07:00:00 via INTRAVENOUS

## 2017-12-22 MED ORDER — SODIUM CHLORIDE 0.9 % IV SOLN
250.0000 mL | INTRAVENOUS | Status: DC | PRN
  Administered 2017-12-22 – 2017-12-24 (×2): 250 mL via INTRAVENOUS

## 2017-12-22 MED ORDER — ACETAMINOPHEN 325 MG PO TABS: 650.0000 mg | ORAL_TABLET | ORAL | Status: DC | PRN

## 2017-12-22 MED ORDER — ASPIRIN 81 MG PO CHEW
81.0000 mg | CHEWABLE_TABLET | Freq: Every day | ORAL | Status: DC
  Administered 2017-12-23 – 2017-12-28 (×6): 81 mg via ORAL
  Filled 2017-12-22 (×6): qty 1

## 2017-12-22 MED ORDER — CARVEDILOL 6.25 MG PO TABS
18.7500 mg | ORAL_TABLET | Freq: Two times a day (BID) | ORAL | Status: DC
  Administered 2017-12-22 – 2017-12-28 (×10): 18.75 mg via ORAL
  Filled 2017-12-22 (×12): qty 1

## 2017-12-22 MED ORDER — HYDRALAZINE HCL 20 MG/ML IJ SOLN
INTRAMUSCULAR | Status: AC
  Filled 2017-12-22: qty 1

## 2017-12-22 SURGICAL SUPPLY — 11 items
CATH INFINITI 5FR MULTPACK ANG (CATHETERS) ×2 IMPLANT
CATH SWAN GANZ 7F STRAIGHT (CATHETERS) ×2 IMPLANT
KIT HEART LEFT (KITS) ×2 IMPLANT
KIT HEART RIGHT NAMIC (KITS) ×2 IMPLANT
PACK CARDIAC CATHETERIZATION (CUSTOM PROCEDURE TRAY) ×2 IMPLANT
SHEATH PINNACLE 5F 10CM (SHEATH) ×2 IMPLANT
SHEATH PINNACLE 7F 10CM (SHEATH) ×2 IMPLANT
TRANSDUCER W/STOPCOCK (MISCELLANEOUS) ×2 IMPLANT
TUBING CIL FLEX 10 FLL-RA (TUBING) ×2 IMPLANT
WIRE EMERALD 3MM-J .035X150CM (WIRE) ×2 IMPLANT
WIRE EMERALD ST .035X150CM (WIRE) ×2 IMPLANT

## 2017-12-22 NOTE — Progress Notes (Signed)
Pre-CABG testing has been completed. 80-99% ICA stenosis on the right. 1-39% ICA stenosis on the left.  ABI's were performed on 10/20/17 Right 0.58 Left 1.01  Palmar waveforms Right Palmar waveforms remain within normal limits with radial compression and are obliterated with ulnar compression. Left Palmar waveforms are obliterated with radial compression and remain within normal limits with ulnar compression.  Right lower extremity saphenous vein mapping.    12/22/17 4:55 PM Carlos Levering RVT

## 2017-12-22 NOTE — Plan of Care (Signed)
Patient alert and oriented.  Denies any discomfort at this time.  CBI clamped, urine is pink tinged but no clots noted.  Monitoring.

## 2017-12-22 NOTE — Progress Notes (Signed)
ANTICOAGULATION CONSULT NOTE   Pharmacy Consult for heparin Indication: chest pain/ACS  No Known Allergies  Patient Measurements: Height: 5\' 8"  (172.7 cm) Weight: 185 lb 13.6 oz (84.3 kg) IBW/kg (Calculated) : 68.4 Heparin Dosing Weight: 87 kg  Vital Signs: Temp: 98.5 F (36.9 C) (08/20 0338) Temp Source: Oral (08/20 0338) BP: 156/75 (08/20 0338) Pulse Rate: 66 (08/20 0338)  Labs: Recent Labs    12/20/17 0029  12/20/17 0907 12/20/17 1536 12/21/17 0332 12/21/17 1244 12/22/17 0222  HGB 10.3*  --   --   --  9.9*  --  9.4*  HCT 29.2*  --   --   --  28.9*  --  28.6*  PLT 222  --   --   --  208  --  192  HEPARINUNFRC  --    < >  --  0.35 0.35  --  0.20*  CREATININE  --   --  1.83*  --   --  1.68*  --    < > = values in this interval not displayed.    Estimated Creatinine Clearance: 41.4 mL/min (A) (by C-G formula based on SCr of 1.68 mg/dL (H)).  Assessment: 72 y.o. male with chest pain for heparin   Goal of Therapy:  Heparin level 0.3-0.7 units/ml Monitor platelets by anticoagulation protocol: Yes   Plan:  Increase Heparin 1650 units/hr Follow up after cath today   Vinal Rosengrant, Bronson Curb 12/22/2017,3:59 AM

## 2017-12-22 NOTE — Progress Notes (Addendum)
Copied from Purcell Mouton, RN  Case Manager  CASE MANAGEMENT    A call to [facility] was made to give update on pt. Pt use Matthew [name], Dr. Rondell Reams (PCP), Matthew [name], [place] [phone] ext [phone], pager [phone].    12/22/17 .  Spoke w patient at bedside. He states he will get his meds filled at [facility] at [place]. Then he will get them from the [place] [facility]. Will need DC note faxed to PCP. Fax number for PCP [phone]  Carles Collet RN CM

## 2017-12-22 NOTE — Progress Notes (Signed)
Day of Surgery Subjective: NAEO.  No specific complaints this AM aside from wanting to go home.  Denies interval chest pain.   Objective: Vital signs in last 24 hours: Temp:  [98 F (36.7 C)-98.8 F (37.1 C)] 98.8 F (37.1 C) (08/20 0756) Pulse Rate:  [58-81] 61 (08/20 0756) Resp:  [14-21] 15 (08/20 0756) BP: (143-177)/(51-89) 164/73 (08/20 0756) SpO2:  [96 %-98 %] 98 % (08/20 1021) Weight:  [84.3 kg] 84.3 kg (08/20 0338)  Intake/Output from previous day: 08/19 0701 - 08/20 0700 In: 2176.8 [I.V.:2176.8] Out: 2650 [Urine:2650]  Intake/Output this shift: Total I/O In: -  Out: 150 [Urine:150]  Physical Exam:  General: Alert and oriented CV: RRR, palpable distal pulses Lungs: CTAB, equal chest rise Abdomen: Soft, NTND, no rebound or guarding Gu: Foley in place and draining yellow urine, off of CBI Ext: NT, No erythema  Lab Results: Recent Labs    12/20/17 0029 12/21/17 0332 12/22/17 0222  HGB 10.3* 9.9* 9.4*  HCT 29.2* 28.9* 28.6*   BMET Recent Labs    12/21/17 1244 12/22/17 0705  NA 141 137  K 4.3 4.2  CL 109 105  CO2 25 22  GLUCOSE 91 102*  BUN 18 18  CREATININE 1.68* 2.10*  CALCIUM 8.8* 8.9     Studies/Results: No results found.  Assessment/Plan: 1 - Enlarged Prostate With Bladder Stones- s/p TURP and cystolithalopaxy.  Urine has remained clear.  Will plan on voiding trial as soon as he recovers from his cardiac cath  2 - Moderate Risk Prostate Cancer - new DX, given his comorbidity, surveillance may be most reasonable options.   3 - Stage 3 Renal Insufficiency- appears medical renal disease. IVF per cardiology following cath  4 - NSTEMI- greatly appreciate cardiology comangment. Continue heparin and nitro gtt with plans for cath today.  Discharge plans per cardiology's post-procedure assessment.    LOS: 4 days   [name], MD [facility] Pager: [phone]  12/22/2017, 10:26 AM
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
## 2017-12-22 NOTE — Consult Note (Signed)
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          Reference  LA ID, A-P, ES  36    mm       ----------  LA ID/bsa, A-P                           1.7   cm/m^2   <=2.2  LA volume, S                             36.2  ml       ----------  LA volume/bsa, S                         17.1  ml/m^2   ----------  LA volume, ES, 1-p A4C                   33    ml       ----------  LA volume/bsa, ES, 1-p A4C               15.6  ml/m^2   ----------  LA volume, ES, 1-p A2C                   38.5  ml       ----------  LA volume/bsa, ES, 1-p A2C               18.2  ml/m^2   ----------    Mitral valve                             Value          Reference  Mitral E-wave  peak velocity              104   cm/s     ----------  Mitral A-wave peak velocity              89.2  cm/s     ----------  Mitral deceleration time         (H)     268   ms       150 - 230  Mitral peak gradient, D                  4     mm Hg    ----------  Mitral E/A ratio, peak                   1.2            ----------    Right atrium                             Value          Reference  RA ID, S-I, ES, A4C                      48.6  mm       34 - 49  RA area, ES, A4C                         17.6  cm^2     8.3 - 19.5  RA volume, ES, A/L                       49.9  ml       ----------  RA volume/bsa, ES, A/L                   23.6  ml/m^2   ----------    Systemic veins                           Value          Reference  Estimated CVP                            15    mm Hg    ----------    Right ventricle                          Value          Reference  RV ID, minor axis, ED, A4C base          30.4  mm       ----------  RV ID, minor axis, ED, A4C mid           17.7  mm       ----------  RV ID, major axis, ED, A4C               83.9  mm       55 - 91  TAPSE                                    26.3  mm       ----------  RV s&', lateral, S                        15.3  cm/s     ----------  Legend: (L)  and  (H)  mark values outside specified reference range.  ------------------------------------------------------------------- Prepared and Electronically Authenticated by  Loralie Champagne, M.D. 2019-08-16T17:23:43    Physicians   Panel Physicians Referring Physician Case Authorizing Physician  [name], MD (Primary)    Procedures   RIGHT/LEFT HEART CATH AND CORONARY ANGIOGRAPHY  Conclusion     Ost LM lesion is 95% stenosed.  Prox Cx to Mid Cx lesion is 60% stenosed.  Ost LAD lesion is 95% stenosed.  Prox LAD to Mid LAD lesion is 75% stenosed.  Mid RCA to Dist RCA lesion is 100% stenosed.   ELDER DAVIDIAN is a 72 y.o. male    [id number] LOCATION:   FACILITY: [facility]  PHYSICIAN: Quay Burow, M.D. May 26, 1945   DATE OF PROCEDURE:  12/22/2017  DATE OF DISCHARGE:     CARDIAC CATHETERIZATION     History obtained from chart review.72 y.o.malewith a history of PVD, CAD, HTN, HL, CKD III, BPH, Mod-Sev Ao stenosis, and recent finding of bladder stones s/p cystolitholapxy and bipolar TURP, who is being seen for the evaluation ofchest pain/NSTEMIat the request of Dr. Lovena Neighbours.  IMPRESSION: Mr. Bursch has high-grade ostial left main, ostial LAD/three-vessel disease with moderate to severe aortic stenosis.  He will need coronary bypass grafting with aortic valve replacement.  His EF is normal by 2D echo.  The femoral artery and arterial sheaths were removed and the Cath Lab and pressure held.  Heparin will be restarted 4 hours after sheath removal without a bolus.  TCT S has been consulted.  The patient left the lab in stable condition.  Quay Burow. MD, [facility] 12/22/2017 11:07 AM        Indications   Non-STEMI (non-ST elevated myocardial infarction) (Combine) [I21.4 (ICD-10-CM)]  Critical aortic valve stenosis [I35.0 (ICD-10-CM)]  Procedural Details/Technique   Technical Details PROCEDURE DESCRIPTION:   The patient was brought to the second floor [facility] cath lab in the postabsorptive state. He does not premedicated . His right groin was prepped and shaved in usual sterile fashion. Xylocaine 1% was used for local anesthesia. A 5 French sheath was inserted into the right common femoral artery using standard Seldinger technique. A 7 French sheath was inserted into the right common femoral vein. A 7 [place] balloontipped thermal dilution Swan-Ganz catheter was then advanced through the right heart chambers obtaining sequential pressures and blood samples for determination of Fick and thermodilution cardiac output. 5 French right and left Judkins diagnostic catheters were used for selective coronary on angiography,  sub-selective left internal mammary artery angiography and crossing the aortic valve to obtain left heart pressures and pullback. Isovue dye was used for the entirety of the case. Retrograde aorta, left ventricular and pullback pressures were recorded.   Estimated blood loss <50 mL.  During this procedure no sedation was administered.  Coronary Findings   Diagnostic  Dominance: Right  Left Main  Ost LM lesion 95% stenosed  Ost LM lesion is 95% stenosed.  Left Anterior Descending  Ost LAD lesion 95% stenosed  Ost LAD lesion is 95% stenosed.  Prox LAD to Mid LAD lesion 75% stenosed  Prox LAD to Mid LAD lesion is 75% stenosed.  Left Circumflex  Prox Cx to Mid Cx lesion 60% stenosed  Prox Cx to Mid Cx lesion is 60% stenosed.  Right Coronary Artery  Mid RCA to Dist RCA lesion 100% stenosed  Mid RCA to Dist RCA lesion is 100% stenosed.  Right Posterior Atrioventricular Branch  Collaterals  Post Atrio filled by collaterals from Acute Mrg.    Intervention   No interventions have been documented.  Right Heart   Right Heart Pressures Right atrial pressure-11/10 Right ventricular pressure- 42/2 Pulmonary artery pressure- 41/16, mean 27 Pulmonary wedge pressure-A-wave 18, V wave 22, mean 16 Aortic gradient-42 mmHg Cardiac output by Fick- 7.3 L/min, by thermodilution-7.1 L/min  Coronary Diagrams   Diagnostic Diagram       Implants    No implant documentation for this case.  MERGE Images   Show images for CARDIAC CATHETERIZATION   Link to Procedure Log   Procedure Log    Hemo Data    Most Recent Value  Fick Cardiac Output 7.34 L/min  Fick Cardiac Output Index 3.59 (L/min)/BSA  Thermal Cardiac Output 7.08 L/min  Thermal Cardiac Output Index 3.46 (L/min)/BSA  Aortic Mean Gradient 42.16 mmHg  Aortic Peak Gradient 41 mmHg  Aortic Valve Area 1.31  Aortic Value Area Index 0.64 cm2/BSA  RA A Wave 11 mmHg  RA V Wave 10 mmHg  RA Mean 8 mmHg  RV Systolic Pressure 42 mmHg    RV Diastolic Pressure 2 mmHg  RV EDP 12 mmHg  PA Systolic Pressure 41 mmHg  PA Diastolic Pressure 16 mmHg  PA Mean 27 mmHg  PW A Wave 18 mmHg  PW V Wave 22 mmHg  PW Mean 16 mmHg  AO Systolic Pressure 175 mmHg  AO Diastolic Pressure 71 mmHg  AO Mean 102 mmHg  LV Systolic Pressure 585 mmHg  LV Diastolic Pressure 12 mmHg  LV EDP 22 mmHg  AOp Systolic Pressure 277 mmHg  AOp  Diastolic Pressure 76 mmHg  AOp Mean Pressure 161 mmHg  LVp Systolic Pressure 096 mmHg  LVp Diastolic Pressure 7 mmHg  LVp EDP Pressure 22 mmHg  TPVR Index 7.81 HRUI  TSVR Index 31.81 HRUI  PVR SVR Ratio 0.11  TPVR/TSVR Ratio 0.25    Assessment/Plan:   This 72 year old gentleman has stage D symptomatic severe aortic stenosis as well as high-grade left main and severe three-vessel coronary disease presenting with an acute non-ST segment elevation MI following urologic surgery for bladder stones and prostate enlargement.  I have personally reviewed his 2D echocardiogram and cardiac catheterization films.  Echocardiogram shows a trileaflet aortic valve with severe leaflet calcification and restricted mobility.  The mean gradient across aortic valve was 49 mmHg consistent with severe aortic stenosis.  Left ventricular ejection fraction is 60 to 65% with grade 2 diastolic dysfunction.  His cardiac catheterization shows a high-grade ostial left main stenosis it was estimated by Dr. Gwenlyn Found to be 95%.  He also has a 95% ostial LAD stenosis and 75% left circumflex stenosis compromising to marginal branches.  The right coronary is a small nondominant vessel is occluded distally.  His right heart pressures were within normal limits with a mildly elevated LVEDP of 22.  He is currently free of chest pain.  I agree that coronary artery bypass graft surgery and aortic valve replacement is indicated in this gentleman.  His operative risk is increased due to his age combined with stage IIIb chronic kidney disease with a creatinine of  around 2 as well as ongoing heavy smoking and COPD and diffuse vascular disease.  His creatinine was 1.76 in 04/2016 and 1.81 in June 2019.  It was 1.98 preoperatively and dropped to 1.68 postoperatively with hydration that was 2.1 this morning prior to his catheterization.  Ideally I think we should wait a couple days before doing surgery to be sure that his creatinine remained stable to minimize his risk of worsening renal failure and need for dialysis.  His left greater saphenous vein is not available due to his prior fem-tib bypass and we will obtain vein mapping of his right greater saphenous vein to assess for available conduit.  There is also a question of a renal mass being present.  The patient's wife said that his urologist at the [place] has been following a renal mass that he thought was probably a cyst but was not completely sure.  He is scheduled for a follow-up CT scan of the abdomen to assess this in September.  He will have preoperative carotid Doppler examination and pulmonary function testing done.  I discussed the operative procedure with the patient and family including alternatives, benefits and risks; including but not limited to bleeding, blood transfusion, infection, stroke, myocardial infarction, graft failure, heart block requiring a permanent pacemaker, organ dysfunction, and death.  Matthew [name] understands and agrees to proceed.  We will schedule surgery for Friday this week if his renal function remains stable.  I spent 60 minutes performing this consultation and > 50% of this time was spent face to face counseling and coordinating the care of this patient's severe left main and three-vessel coronary disease and severe aortic stenosis.  [name] 12/22/2017, 4:32 PM

## 2017-12-22 NOTE — H&P (View-Only) (Signed)
Progress Note  Patient Name: Matthew [name] Date of Encounter: 12/22/2017  Primary Cardiologist: [facility]  Subjective   Pt denies CP or dyspnea  Inpatient Medications    Scheduled Meds: . amLODipine  10 mg Oral q morning - 10a  . atorvastatin  80 mg Oral QHS  . carvedilol  12.5 mg Oral BID WC  . doxazosin  2 mg Oral QHS  . gabapentin  600 mg Oral TID  . isosorbide mononitrate  30 mg Oral Daily  . latanoprost  1 drop Both Eyes QHS  . oxybutynin  10 mg Oral Daily   Continuous Infusions: . sodium chloride 100 mL/hr at 12/22/17 0703  . heparin 1,650 Units/hr (12/22/17 0700)  . sodium chloride irrigation     PRN Meds: acetaminophen, bismuth subsalicylate, diphenhydrAMINE **OR** diphenhydrAMINE, LORazepam, morphine injection, nitroGLYCERIN, ondansetron, opium-belladonna, oxybutynin, oxyCODONE-acetaminophen **AND** oxyCODONE, pentosan polysulfate   Vital Signs    Vitals:   12/21/17 1943 12/21/17 2000 12/22/17 0338 12/22/17 0756  BP: (!) 155/58  (!) 156/75 (!) 164/73  Pulse: 81  66 61  Resp: (!) 21 16 18 15   Temp: 98 F (36.7 C)  98.5 F (36.9 C) 98.8 F (37.1 C)  TempSrc: Oral  Oral   SpO2: 96% 96% 97% 98%  Weight: 84.3 kg  84.3 kg   Height: 5\' 8"  (1.727 m)       Intake/Output Summary (Last 24 hours) at 12/22/2017 0831 Last data filed at 12/22/2017 0700 Gross per 24 hour  Intake 2176.82 ml  Output 2650 ml  Net -473.18 ml   Filed Weights   12/21/17 1558 12/21/17 1943 12/22/17 0338  Weight: 84.3 kg 84.3 kg 84.3 kg    Telemetry    Sinus- Personally Reviewed   Physical Exam   GEN: No acute distress.  WD/WN Neck: No JVD, supple Cardiac: RRR, 3/6 systolic murmur Respiratory: Clear to auscultation bilaterally; no wheeze. GI: Soft, nontender, non-distended, no masses  MS: No edema Neuro:  Grossly intact   Labs    Chemistry Recent Labs  Lab 12/20/17 0907 12/21/17 1244 12/22/17 0705  NA 139 141 137  K 4.1 4.3 4.2  CL 107 109 105  CO2 23 25  22   GLUCOSE 125* 91 102*  BUN 18 18 18   CREATININE 1.83* 1.68* 2.10*  CALCIUM 8.7* 8.8* 8.9  GFRNONAA 35* 39* 30*  GFRAA 41* 45* 34*  ANIONGAP 9 7 10      Hematology Recent Labs  Lab 12/20/17 0029 12/21/17 0332 12/22/17 0222  WBC 9.2 8.4 8.0  RBC 3.29* 3.21* 3.07*  HGB 10.3* 9.9* 9.4*  HCT 29.2* 28.9* 28.6*  MCV 88.8 90.0 93.2  MCH 31.3 30.8 30.6  MCHC 35.3 34.3 32.9  RDW 12.4 12.5 12.1  PLT 222 208 192    Cardiac Enzymes Recent Labs  Lab 12/18/17 0827 12/18/17 1401 12/18/17 1919  TROPONINI 2.58* 2.37* 2.23*    Patient Profile     72 y.o. male with a history of PVD, CAD, HTN, HL, CKD III, BPH, Mod-Sev Ao stenosis, and recent finding of bladder stones s/p cystolitholapxy and bipolar TURP, who is being seen for the evaluation ofchest pain/NSTEMIat the request of Dr. Lovena Neighbours.  Assessment & Plan    1 non-ST elevation myocardial infarction-Continue aspirin, heparin, beta-blocker and statin. Plan cardiac cath today (risks and benefits discussed including MI, CVA and death and pt agrees to proceed; also discussed worsening renal insuff).   2 severe aortic stenosis-noted on echocardiogram.  He will eventually require aortic valve  replacement.  For R and L cardiac catheterization today.  3 chronic stage III kidney disease-Cr increased this AM compared to yesterday but similar to admission values; he has been hydrated; proceed with cath; limit dye; no vgram; follow renal function following procedure.  4 status post cystolitholopaxy and bipolar TURP-Per urology  5 hypertension-blood pressure remains mildly elevated.  Increase coreg to 18.75 mg bid and follow.  6 hyperlipidemia-continue statin.   For questions or updates, please contact [place] Please consult [email or web address] for contact info under Cardiology/STEMI.      Signed, Kirk Ruths, MD  12/22/2017, 8:31 AM

## 2017-12-22 NOTE — Progress Notes (Signed)
ANTICOAGULATION CONSULT NOTE   Pharmacy Consult for heparin Indication: chest pain/ACS  No Known Allergies  Patient Measurements: Height: 5\' 8"  (172.7 cm) Weight: 185 lb 13.6 oz (84.3 kg) IBW/kg (Calculated) : 68.4 Heparin Dosing Weight: 87 kg  Vital Signs: Temp: 98.4 F (36.9 C) (08/20 1953) Temp Source: Oral (08/20 1953) BP: 161/82 (08/20 2000) Pulse Rate: 71 (08/20 2000)  Labs: Recent Labs    12/20/17 0029  12/20/17 0907  12/21/17 0332 12/21/17 1244 12/22/17 0222 12/22/17 0705 12/22/17 2059  HGB 10.3*  --   --   --  9.9*  --  9.4*  --   --   HCT 29.2*  --   --   --  28.9*  --  28.6*  --   --   PLT 222  --   --   --  208  --  192  --   --   HEPARINUNFRC  --    < >  --    < > 0.35  --  0.20*  --  0.22*  CREATININE  --   --  1.83*  --   --  1.68*  --  2.10*  --    < > = values in this interval not displayed.    Estimated Creatinine Clearance: 33.1 mL/min (A) (by C-G formula based on SCr of 2.1 mg/dL (H)).  Assessment: 72 y.o. male with hx PVD, CAD, HTN, and s/p TURP/cystolithalopexy on 12/16/17 with noted CP and elevated troponin on 12/19/17. Patient went for cardiac cath 8/20, pharmacy instructed to restart heparin 4 hours after sheath has been removed.   Heparin level subtherapeutic at 0.22, no issues with infusion per RN.  Goal of Therapy:  Heparin level 0.3-0.7 units/ml Monitor platelets by anticoagulation protocol: Yes   Plan:  Increase heparin to 1800 units/hr Recheck heparin level with am labs   Arrie Senate, PharmD, BCPS Clinical Pharmacist [phone] Please check AMION for all [facility] numbers 12/22/2017

## 2017-12-22 NOTE — Progress Notes (Signed)
Progress Note  Patient Name: Matthew [name] Date of Encounter: 12/22/2017  Primary Cardiologist: [facility]  Subjective   Pt denies CP or dyspnea  Inpatient Medications    Scheduled Meds: . amLODipine  10 mg Oral q morning - 10a  . atorvastatin  80 mg Oral QHS  . carvedilol  12.5 mg Oral BID WC  . doxazosin  2 mg Oral QHS  . gabapentin  600 mg Oral TID  . isosorbide mononitrate  30 mg Oral Daily  . latanoprost  1 drop Both Eyes QHS  . oxybutynin  10 mg Oral Daily   Continuous Infusions: . sodium chloride 100 mL/hr at 12/22/17 0703  . heparin 1,650 Units/hr (12/22/17 0700)  . sodium chloride irrigation     PRN Meds: acetaminophen, bismuth subsalicylate, diphenhydrAMINE **OR** diphenhydrAMINE, LORazepam, morphine injection, nitroGLYCERIN, ondansetron, opium-belladonna, oxybutynin, oxyCODONE-acetaminophen **AND** oxyCODONE, pentosan polysulfate   Vital Signs    Vitals:   12/21/17 1943 12/21/17 2000 12/22/17 0338 12/22/17 0756  BP: (!) 155/58  (!) 156/75 (!) 164/73  Pulse: 81  66 61  Resp: (!) 21 16 18 15   Temp: 98 F (36.7 C)  98.5 F (36.9 C) 98.8 F (37.1 C)  TempSrc: Oral  Oral   SpO2: 96% 96% 97% 98%  Weight: 84.3 kg  84.3 kg   Height: 5\' 8"  (1.727 m)       Intake/Output Summary (Last 24 hours) at 12/22/2017 0831 Last data filed at 12/22/2017 0700 Gross per 24 hour  Intake 2176.82 ml  Output 2650 ml  Net -473.18 ml   Filed Weights   12/21/17 1558 12/21/17 1943 12/22/17 0338  Weight: 84.3 kg 84.3 kg 84.3 kg    Telemetry    Sinus- Personally Reviewed   Physical Exam   GEN: No acute distress.  WD/WN Neck: No JVD, supple Cardiac: RRR, 3/6 systolic murmur Respiratory: Clear to auscultation bilaterally; no wheeze. GI: Soft, nontender, non-distended, no masses  MS: No edema Neuro:  Grossly intact   Labs    Chemistry Recent Labs  Lab 12/20/17 0907 12/21/17 1244 12/22/17 0705  NA 139 141 137  K 4.1 4.3 4.2  CL 107 109 105  CO2 23 25  22   GLUCOSE 125* 91 102*  BUN 18 18 18   CREATININE 1.83* 1.68* 2.10*  CALCIUM 8.7* 8.8* 8.9  GFRNONAA 35* 39* 30*  GFRAA 41* 45* 34*  ANIONGAP 9 7 10      Hematology Recent Labs  Lab 12/20/17 0029 12/21/17 0332 12/22/17 0222  WBC 9.2 8.4 8.0  RBC 3.29* 3.21* 3.07*  HGB 10.3* 9.9* 9.4*  HCT 29.2* 28.9* 28.6*  MCV 88.8 90.0 93.2  MCH 31.3 30.8 30.6  MCHC 35.3 34.3 32.9  RDW 12.4 12.5 12.1  PLT 222 208 192    Cardiac Enzymes Recent Labs  Lab 12/18/17 0827 12/18/17 1401 12/18/17 1919  TROPONINI 2.58* 2.37* 2.23*    Patient Profile     72 y.o. male with a history of PVD, CAD, HTN, HL, CKD III, BPH, Mod-Sev Ao stenosis, and recent finding of bladder stones s/p cystolitholapxy and bipolar TURP, who is being seen for the evaluation ofchest pain/NSTEMIat the request of Dr. Lovena Neighbours.  Assessment & Plan    1 non-ST elevation myocardial infarction-Continue aspirin, heparin, beta-blocker and statin. Plan cardiac cath today (risks and benefits discussed including MI, CVA and death and pt agrees to proceed; also discussed worsening renal insuff).   2 severe aortic stenosis-noted on echocardiogram.  He will eventually require aortic valve  replacement.  For R and L cardiac catheterization today.  3 chronic stage III kidney disease-Cr increased this AM compared to yesterday but similar to admission values; he has been hydrated; proceed with cath; limit dye; no vgram; follow renal function following procedure.  4 status post cystolitholopaxy and bipolar TURP-Per urology  5 hypertension-blood pressure remains mildly elevated.  Increase coreg to 18.75 mg bid and follow.  6 hyperlipidemia-continue statin.   For questions or updates, please contact [place] Please consult [email or web address] for contact info under Cardiology/STEMI.      Signed, Kirk Ruths, MD  12/22/2017, 8:31 AM

## 2017-12-22 NOTE — Progress Notes (Signed)
[place] for heparin Indication: chest pain/ACS  No Known Allergies  Patient Measurements: Height: 5\' 8"  (172.7 cm) Weight: 185 lb 13.6 oz (84.3 kg) IBW/kg (Calculated) : 68.4 Heparin Dosing Weight: 87 kg  Vital Signs: Temp: 98.8 F (37.1 C) (08/20 0756) Temp Source: Oral (08/20 0338) BP: 163/90 (08/20 1114) Pulse Rate: 0 (08/20 1119)  Labs: Recent Labs    12/20/17 0029  12/20/17 0907 12/20/17 1536 12/21/17 0332 12/21/17 1244 12/22/17 0222 12/22/17 0705  HGB 10.3*  --   --   --  9.9*  --  9.4*  --   HCT 29.2*  --   --   --  28.9*  --  28.6*  --   PLT 222  --   --   --  208  --  192  --   HEPARINUNFRC  --    < >  --  0.35 0.35  --  0.20*  --   CREATININE  --   --  1.83*  --   --  1.68*  --  2.10*   < > = values in this interval not displayed.    Estimated Creatinine Clearance: 33.1 mL/min (A) (by C-G formula based on SCr of 2.1 mg/dL (H)).  Assessment: 72 y.o. male with hx PVD, CAD, HTN, and s/p TURP/cystolithalopexy on 12/16/17 with noted CP and elevated troponin on 12/19/17. Patient went for cardiac cath 8/20, pharmacy instructed to restart heparin 4 hours after sheath has been removed.   Sheath removed 8/20 @ 1100. H/H low but relatively stable.   Goal of Therapy:  Heparin level 0.3-0.7 units/ml Monitor platelets by anticoagulation protocol: Yes   Plan:  No bolus Restart heparin 1650 units/hr at 1500. Check anti-Xa level in 6 hours and daily while on heparin Continue to monitor H&H and platelets   Claiborne Billings, PharmD PGY2 Cardiology Pharmacy Resident Phone [phone] 12/22/2017 11:25 AM

## 2017-12-22 NOTE — Interval H&P Note (Signed)
Cath Lab Visit (complete for each Cath Lab visit)  Clinical Evaluation Leading to the Procedure:   ACS: No.  Non-ACS:    Anginal Classification: CCS II  Anti-ischemic medical therapy: Maximal Therapy (2 or more classes of medications)  Non-Invasive Test Results: No non-invasive testing performed  Prior CABG: No previous CABG      History and Physical Interval Note:  12/22/2017 10:15 AM  Matthew [name]  has presented today for surgery, with the diagnosis of NSTEMI  The various methods of treatment have been discussed with the patient and family. After consideration of risks, benefits and other options for treatment, the patient has consented to  Procedure(s): LEFT HEART CATH AND CORONARY ANGIOGRAPHY (N/A) as a surgical intervention .  The patient's history has been reviewed, patient examined, no change in status, stable for surgery.  I have reviewed the patient's chart and labs.  Questions were answered to the patient's satisfaction.     Quay Burow

## 2017-12-23 ENCOUNTER — Inpatient Hospital Stay (HOSPITAL_COMMUNITY): Payer: No Typology Code available for payment source

## 2017-12-23 DIAGNOSIS — I6521 Occlusion and stenosis of right carotid artery: Secondary | ICD-10-CM

## 2017-12-23 LAB — CBC
HCT: 29.1 % — ABNORMAL LOW (ref 39.0–52.0)
Hemoglobin: 9.7 g/dL — ABNORMAL LOW (ref 13.0–17.0)
MCH: 30.3 pg (ref 26.0–34.0)
MCHC: 33.3 g/dL (ref 30.0–36.0)
MCV: 90.9 fL (ref 78.0–100.0)
PLATELETS: 219 10*3/uL (ref 150–400)
RBC: 3.2 MIL/uL — AB (ref 4.22–5.81)
RDW: 11.9 % (ref 11.5–15.5)
WBC: 8.3 10*3/uL (ref 4.0–10.5)

## 2017-12-23 LAB — BASIC METABOLIC PANEL
Anion gap: 9 (ref 5–15)
BUN: 17 mg/dL (ref 8–23)
CALCIUM: 8.6 mg/dL — AB (ref 8.9–10.3)
CHLORIDE: 106 mmol/L (ref 98–111)
CO2: 24 mmol/L (ref 22–32)
CREATININE: 1.91 mg/dL — AB (ref 0.61–1.24)
GFR calc non Af Amer: 33 mL/min — ABNORMAL LOW (ref >60)
GFR, EST AFRICAN AMERICAN: 38 mL/min — AB (ref >60)
GLUCOSE: 89 mg/dL (ref 70–99)
Potassium: 4 mmol/L (ref 3.5–5.1)
Sodium: 139 mmol/L (ref 135–145)

## 2017-12-23 LAB — HEPARIN LEVEL (UNFRACTIONATED): HEPARIN UNFRACTIONATED: 0.34 [IU]/mL (ref 0.30–0.70)

## 2017-12-23 MED ORDER — NICOTINE 14 MG/24HR TD PT24
14.0000 mg | MEDICATED_PATCH | Freq: Every day | TRANSDERMAL | Status: DC
  Filled 2017-12-23 (×4): qty 1

## 2017-12-23 NOTE — Progress Notes (Signed)
I spoke with Dr. Stanford Breed about taking over as the primary team for Matthew [name] and he was agreeable.  I will continue to follow along while the patient is in the hospital.  [name], MD Urology

## 2017-12-23 NOTE — Progress Notes (Signed)
Pt up in recliner. Discussed sternal precautions, mobility, IS (2100 mL now), and d/c planning. Pt was intermittently distracted during education but seemed to enjoy learning by reading. Left materials for him and wife. He sts his wife is an Therapist, sports and will be with him at d/c. Will not ambulate preop due to severe LM. [id number] [place], ACSM 11:18 AM 12/23/2017

## 2017-12-23 NOTE — Progress Notes (Signed)
[place] for heparin Indication: chest pain/ACS  No Known Allergies  Patient Measurements: Height: 5\' 8"  (172.7 cm) Weight: 185 lb 13.6 oz (84.3 kg) IBW/kg (Calculated) : 68.4 Heparin Dosing Weight: 87 kg  Vital Signs: Temp: 98.2 F (36.8 C) (08/21 1100) Temp Source: Oral (08/21 1100) BP: 128/74 (08/21 1300) Pulse Rate: 68 (08/21 1300)  Labs: Recent Labs    12/21/17 0332 12/21/17 1244 12/22/17 0222 12/22/17 0705 12/22/17 2059 12/23/17 0207  HGB 9.9*  --  9.4*  --   --  9.7*  HCT 28.9*  --  28.6*  --   --  29.1*  PLT 208  --  192  --   --  219  HEPARINUNFRC 0.35  --  0.20*  --  0.22* 0.34  CREATININE  --  1.68*  --  2.10*  --  1.91*    Estimated Creatinine Clearance: 36.4 mL/min (A) (by C-G formula based on SCr of 1.91 mg/dL (H)).  Assessment: 71 y.o. male with hx PVD, CAD, HTN, and s/p TURP/cystolithalopexy on 12/16/17 with noted CP and elevated troponin on 12/19/17. Patient went for cardiac cath 8/20, pharmacy instructed to restart heparin 4 hours after sheath has been removed.   Heparin level was therapeutic at 0.34, on 1800 units/hr. Hgb 9.7, plt 219. No s/sx of bleeding. No infusion issues per nursing.   Goal of Therapy:  Heparin level 0.3-0.7 units/ml Monitor platelets by anticoagulation protocol: Yes   Plan:  Continue heparin infusion at 1800 units/hr Monitor CBC, HL daily & s/sx of bleeding  Doylene Canard, PharmD Clinical Pharmacist  Pager: [phone] Phone: [phone] Please check AMION for all [facility] numbers 12/23/2017

## 2017-12-23 NOTE — Consult Note (Signed)
Vascular and Vein Specialist of [facility]  Patient name: Matthew [name] MRN: [id number] DOB: [birth year] Sex: male   REQUESTING PROVIDER:    Dr. Cyndia Bent   REASON FOR CONSULT:    Right Carotid stenosis  HISTORY OF PRESENT ILLNESS:   Matthew [name] is a 72 y.o. male, who is well-known to our service, having undergone left femoral to posterior tibial bypass graft with in situ saphenous vein on 04/17/2016.  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  He had amputation of his left great toe by Dr. Sharol Given with complete healing.  The patient presented to the hospital after undergoing a TURP on 12/16/2017.  Following his procedure he developed severe chest pain with elevated troponins.  He also had echo which showed severe aortic stenosis.  He underwent cardiac catheterization which showed severe coronary disease.  He is being evaluated for aortic valve replacement and CABG.  His pre-CABG Doppler was identified a greater than 80% right carotid stenosis.  He is asymptomatic from a neurologic perspective.  He denies numbness or weakness in either extremity.  He denies slurred speech.  He denies amaurosis fugax.  Patient is a current smoker.  He is medically managed for hypertension.  He takes a statin for hypercholesterolemia.  He suffers from chronic renal insufficiency.  His most recent creatinine is 1.91.  PAST MEDICAL HISTORY    Past Medical History:  Diagnosis Date  . Arthritis   . Bladder stones   . BPH (benign prostatic hyperplasia)   . CAD (coronary artery disease)    a. Pt reports prior stenting @ [facility] - no records in Epic.  Marland Kitchen Chronic kidney disease (CKD), stage III (moderate) (HCC)    dx from [place] in [place], [place]  . Heart murmur    a. AS  . Hyperlipidemia   . Hypertension   . Moderate to severe aortic stenosis    a. PER 01-15-16 [facility].  Marland Kitchen Neuropathy    FEET AND HANDS  . Peripheral vascular disease ([place])    a. 04/2016  s/p L Fem->PT bypass; b. 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 04/2017 LLE Duplex: No restenosis. ABI R 0.63, ABI L 1.01.  Marland Kitchen Post traumatic stress disorder (PTSD)   . Pre-diabetes      FAMILY HISTORY   Family History  Problem Relation Age of Onset  . Leukemia Sister   . Lung cancer Brother   . Heart failure Mother        died @ 22  . Diabetes Father        died @ 59  . Heart attack Father     SOCIAL HISTORY:   Social History   Socioeconomic History  . Marital status: Married    Spouse name: Not on file  . Number of children: Not on file  . Years of education: Not on file  . Highest education level: Not on file  Occupational History  . Not on file  Social Needs  . Financial resource strain: Not on file  . Food insecurity:    Worry: Not on file    Inability: Not on file  . Transportation needs:    Medical: Not on file    Non-medical: Not on file  Tobacco Use  . Smoking status: Current Every Day Smoker    Packs/day: 1.00    Years: 51.00    Pack years: 51.00    Types: Cigarettes  . Smokeless tobacco: Never Used  Substance and Sexual  Activity  . Alcohol use: Never    Frequency: Never  . Drug use: Never  . Sexual activity: Never  Lifestyle  . Physical activity:    Days per week: Not on file    Minutes per session: Not on file  . Stress: Not on file  Relationships  . Social connections:    Talks on phone: Not on file    Gets together: Not on file    Attends religious service: Not on file    Active member of club or organization: Not on file    Attends meetings of clubs or organizations: Not on file    Relationship status: Not on file  . Intimate partner violence:    Fear of current or ex partner: Not on file    Emotionally abused: Not on file    Physically abused: Not on file    Forced sexual activity: Not on file  Other Topics Concern  . Not on file  Social History Narrative   Lives in [place] with wife.  Retired Administrator.  Walks regularly.     ALLERGIES:    No Known Allergies  CURRENT MEDICATIONS:    Current Facility-Administered Medications  Medication Dose Route Frequency Provider Last Rate Last Dose  . 0.9 %  sodium chloride infusion  250 mL Intravenous PRN [name], MD 10 mL/hr at 12/23/17 1700    . acetaminophen (TYLENOL) tablet 650 mg  650 mg Oral Q4H PRN [name], MD   650 mg at 12/21/17 0413  . amLODipine (NORVASC) tablet 10 mg  10 mg Oral q morning - 10a [name], MD   10 mg at 12/23/17 0948  . aspirin chewable tablet 81 mg  81 mg Oral Daily [name], MD   81 mg at 12/23/17 0948  . atorvastatin (LIPITOR) tablet 80 mg  80 mg Oral QHS [name], MD   80 mg at 12/22/17 2151  . bismuth subsalicylate (PEPTO BISMOL) 262 MG/15ML suspension 30 mL  30 mL Oral Q6H PRN [name], MD   30 mL at 12/18/17 0857  . carvedilol (COREG) tablet 18.75 mg  18.75 mg Oral BID WC [name], MD   Stopped at 12/23/17 1608  . diphenhydrAMINE (BENADRYL) injection 12.5 mg  12.5 mg Intravenous Q6H PRN [name], MD       Or  . diphenhydrAMINE (BENADRYL) 12.5 MG/5ML elixir 12.5 mg  12.5 mg Oral Q6H PRN [name], MD      . doxazosin (CARDURA) tablet 2 mg  2 mg Oral QHS [name], MD   2 mg at 12/22/17 2152  . gabapentin (NEURONTIN) capsule 600 mg  600 mg Oral TID [name], MD   600 mg at 12/23/17 1607  . heparin ADULT infusion 100 units/mL (25000 units/21mL sodium chloride 0.45%)  1,800 Units/hr Intravenous Continuous Einar Grad, RPH 18 mL/hr at 12/23/17 1700 1,800 Units/hr at 12/23/17 1700  . isosorbide mononitrate (IMDUR) 24 hr tablet 30 mg  30 mg Oral Daily [name], MD   30 mg at 12/23/17 0947  . latanoprost (XALATAN) 0.005 % ophthalmic solution 1 drop  1 drop Both Eyes QHS [name], MD   1 drop at 12/22/17 2152  . LORazepam (ATIVAN) injection 0.5 mg  0.5 mg Intravenous Q6H PRN [name], MD   0.5 mg at 12/18/17 1817   . morphine 2 MG/ML injection 2 mg  2 mg Intravenous Q1H  PRN [name], MD   2 mg at 12/23/17 1046  . nicotine (NICODERM CQ - dosed in mg/24 hours) patch 14 mg  14 mg Transdermal Daily Winter, Christopher Aaron, MD      . nitroGLYCERIN (NITROSTAT) SL tablet 0.4 mg  0.4 mg Sublingual Q5 min PRN [name], MD   0.4 mg at 12/18/17 1719  . ondansetron (ZOFRAN) injection 4 mg  4 mg Intravenous Q4H PRN [name], MD      . opium-belladonna (B&O SUPPRETTES) 16.2-60 MG suppository 1 suppository  1 suppository Rectal Q6H PRN [name], MD   1 suppository at 12/16/17 1730  . oxybutynin (DITROPAN) tablet 5 mg  5 mg Oral Q8H PRN [name], MD   5 mg at 12/17/17 1804  . oxybutynin (DITROPAN-XL) 24 hr tablet 10 mg  10 mg Oral Daily [name], MD   10 mg at 12/23/17 0947  . oxyCODONE-acetaminophen (PERCOCET/ROXICET) 5-325 MG per tablet 1 tablet  1 tablet Oral Q4H PRN [name], MD   1 tablet at 12/23/17 1334   And  . oxyCODONE (Oxy IR/ROXICODONE) immediate release tablet 5 mg  5 mg Oral Q4H PRN [name], MD   5 mg at 12/23/17 1334  . pentosan polysulfate (ELMIRON) capsule 100 mg  100 mg Oral TID PRN [name], MD      . sodium chloride flush (NS) 0.9 % injection 3 mL  3 mL Intravenous Q12H [name], MD   3 mL at 12/23/17 0950  . sodium chloride flush (NS) 0.9 % injection 3 mL  3 mL Intravenous PRN [name], MD      . sodium chloride irrigation 0.9 % 3,000 mL  3,000 mL Intracatheter Continuous [name], MD   3,000 mL at 12/18/17 2200    REVIEW OF SYSTEMS:   [X]  denotes positive finding, [ ]  denotes negative finding Cardiac  Comments:  Chest pain or chest pressure: x   Shortness of breath upon exertion:    Short of breath when lying flat:    Irregular heart rhythm:        Vascular    Pain in calf, thigh, or hip brought on by ambulation:    Pain in feet at night that wakes you up from your sleep:     Blood  clot in your veins:    Leg swelling:         Pulmonary    Oxygen at home:    Productive cough:     Wheezing:         Neurologic    Sudden weakness in arms or legs:     Sudden numbness in arms or legs:     Sudden onset of difficulty speaking or slurred speech:    Temporary loss of vision in one eye:     Problems with dizziness:         Gastrointestinal    Blood in stool:      Vomited blood:         Genitourinary    Burning when urinating:     Blood in urine:        Psychiatric    Major depression:         Hematologic    Bleeding problems:    Problems with blood clotting too easily:        Skin    Rashes or ulcers:        Constitutional  Fever or chills:     PHYSICAL EXAM:   Vitals:   12/23/17 1400 12/23/17 1500 12/23/17 1600 12/23/17 1700  BP: 110/62 126/61  123/65  Pulse: 66 67 69 (!) 55  Resp: 18 (!) 22 (!) 22 17  Temp:  97.8 F (36.6 C)    TempSrc:  Oral    SpO2: 97% 94% 96% 95%  Weight:      Height:        GENERAL: The patient is a well-nourished male, in no acute distress. The vital signs are documented above. CARDIAC: There is a regular rate and rhythm.  PULMONARY: Nonlabored respirations ABDOMEN: Soft and non-tender with normal pitched bowel sounds.  MUSCULOSKELETAL: There are no major deformities or cyanosis. NEUROLOGIC: No focal weakness or paresthesias are detected. SKIN: There are no ulcers or rashes noted. PSYCHIATRIC: The patient has a normal affect.  STUDIES:   I have reviewed his pre-CABG duplex with the following findings:  80-99% ICA stenosis on the right. 1-39% ICA stenosis on the left.  ABI's were performed on 10/20/17 Right 0.58 Left 1.01  ASSESSMENT and PLAN   Asymptomatic right carotid stenosis in the setting of severe aortic stenosis and coronary disease, for which he is scheduled for surgical repair.  I spoke with the patient and his wife who was present at the bedside.  His carotid ultrasound identifies elevated  velocities within the mid and internal carotid artery.  This suggests the possibility that his lesion may be on the high side.  Before proceeding with carotid endarterectomy, I would like to have additional information about the distal extent of his right carotid stenosis.  Unfortunately because of his renal insufficiency this is going to be challenging.  I discussed ordering a noncontrasted CT scan of his neck to identify the distal extent of the calcification within his right internal carotid artery.  I will get this done tonight.  Further recommendations regarding carotid endarterectomy will be made tomorrow, however because he is asymptomatic from a carotid perspective my thoughts would be to proceed with repair of his aortic valve and coronary disease and once he has recovered from this, entertain right carotid endarterectomy.   Annamarie Major, MD Vascular and Vein Specialists of [facility] [phone] Pager [phone]

## 2017-12-23 NOTE — Progress Notes (Signed)
1 Day Post-Op Subjective: The patient is eating dinner with no complaints.  Denies Catheter discomfort or chest pain.   Objective: Vital signs in last 24 hours: Temp:  [97.8 F (36.6 C)-98.7 F (37.1 C)] 97.8 F (36.6 C) (08/21 1500) Pulse Rate:  [55-81] 55 (08/21 1700) Resp:  [13-22] 17 (08/21 1700) BP: (110-178)/(61-88) 123/65 (08/21 1700) SpO2:  [94 %-97 %] 95 % (08/21 1700)  Intake/Output from previous day: 08/20 0701 - 08/21 0700 In: 1102.1 [I.V.:1102.1] Out: 2725 [Urine:2725]  Intake/Output this shift: Total I/O In: 292.5 [P.O.:10; I.V.:282.5] Out: 550 [Urine:550]  Physical Exam:  General: Alert and oriented CV: RRR, palpable distal pulses Lungs: CTAB, equal chest rise Abdomen: Soft, NTND, no rebound or guarding Gu: Foley draining yellow urine Ext: NT, No erythema  Lab Results: Recent Labs    12/21/17 0332 12/22/17 0222 12/23/17 0207  HGB 9.9* 9.4* 9.7*  HCT 28.9* 28.6* 29.1*   BMET Recent Labs    12/22/17 0705 12/23/17 0207  NA 137 139  K 4.2 4.0  CL 105 106  CO2 22 24  GLUCOSE 102* 89  BUN 18 17  CREATININE 2.10* 1.91*  CALCIUM 8.9 8.6*     Studies/Results: No results found.  Assessment/Plan: 1 - Enlarged Prostate With Bladder Stones- s/p TURP and cystolithalopaxy.  Urine has remained clear for the past several days despite anticoagulation.  Will leave catheter in place leading up to his CABG and during the immediate recovery period out of concern for causing hematuria with catheter removal/re-insertion.  2 - Moderate Risk Prostate Cancer - new DX, given his comorbidity, surveillance may be most reasonable options.   3 - Stage 3 Renal Insufficiency- appears medical renal disease. Creatinine slightly improved today.IVF per cardiology   4 - NSTEMI- greatly appreciate cardiology and CT surgery. Tentative plans for CABG on 8/23  5.  Right renal lesion with features concerning for RCC-  The mass is being followed by Dr. Lenis Dickinson  with [facility].  He has a repeat CT scheduled for September.     LOS: 5 days   [name], MD [facility] Pager: [phone]  12/23/2017, 6:38 PM

## 2017-12-23 NOTE — Progress Notes (Signed)
Faxed clinicals to [facility] at [place] at [phone]

## 2017-12-23 NOTE — Progress Notes (Signed)
Progress Note  Patient Name: Matthew [name] Date of Encounter: 12/23/2017  Primary Cardiologist: Miguel [name]   Denies CP or dyspnea  Inpatient Medications    Scheduled Meds: . amLODipine  10 mg Oral q morning - 10a  . aspirin  81 mg Oral Daily  . atorvastatin  80 mg Oral QHS  . carvedilol  18.75 mg Oral BID WC  . doxazosin  2 mg Oral QHS  . gabapentin  600 mg Oral TID  . isosorbide mononitrate  30 mg Oral Daily  . latanoprost  1 drop Both Eyes QHS  . nicotine  14 mg Transdermal Daily  . oxybutynin  10 mg Oral Daily  . sodium chloride flush  3 mL Intravenous Q12H   Continuous Infusions: . sodium chloride 10 mL/hr at 12/23/17 0800  . heparin 1,800 Units/hr (12/23/17 0800)  . sodium chloride irrigation     PRN Meds: sodium chloride, acetaminophen, bismuth subsalicylate, diphenhydrAMINE **OR** diphenhydrAMINE, LORazepam, morphine injection, nitroGLYCERIN, ondansetron, opium-belladonna, oxybutynin, oxyCODONE-acetaminophen **AND** oxyCODONE, pentosan polysulfate, sodium chloride flush   Vital Signs    Vitals:   12/23/17 0600 12/23/17 0700 12/23/17 0800 12/23/17 0900  BP: (!) 166/76 (!) 147/61 (!) 153/74 137/73  Pulse: 64 81 78 63  Resp: 13 17 18 19   Temp:  98.5 F (36.9 C)    TempSrc:      SpO2: 97% 97% 97% 96%  Weight:      Height:        Intake/Output Summary (Last 24 hours) at 12/23/2017 0938 Last data filed at 12/23/2017 0800 Gross per 24 hour  Intake 1130.05 ml  Output 2575 ml  Net -1444.95 ml   Filed Weights   12/21/17 1558 12/21/17 1943 12/22/17 0338  Weight: 84.3 kg 84.3 kg 84.3 kg    Telemetry    Sinus- Personally Reviewed   Physical Exam   GEN: NAD Neck: Supple Cardiac: RRR, 3/6 systolic murmur, no diastolic murmur Respiratory: CTA GI: Soft, nontender, non-distended, right groin with no hematoma and no bruit MS: No edema Neuro: No focal findings   Labs    Chemistry Recent Labs  Lab 12/21/17 1244 12/22/17 0705  12/23/17 0207  NA 141 137 139  K 4.3 4.2 4.0  CL 109 105 106  CO2 25 22 24   GLUCOSE 91 102* 89  BUN 18 18 17   CREATININE 1.68* 2.10* 1.91*  CALCIUM 8.8* 8.9 8.6*  GFRNONAA 39* 30* 33*  GFRAA 45* 34* 38*  ANIONGAP 7 10 9      Hematology Recent Labs  Lab 12/21/17 0332 12/22/17 0222 12/23/17 0207  WBC 8.4 8.0 8.3  RBC 3.21* 3.07* 3.20*  HGB 9.9* 9.4* 9.7*  HCT 28.9* 28.6* 29.1*  MCV 90.0 93.2 90.9  MCH 30.8 30.6 30.3  MCHC 34.3 32.9 33.3  RDW 12.5 12.1 11.9  PLT 208 192 219    Cardiac Enzymes Recent Labs  Lab 12/18/17 0827 12/18/17 1401 12/18/17 1919  TROPONINI 2.58* 2.37* 2.23*    Patient Profile     72 y.o. male with a history of PVD, CAD, HTN, HL, CKD III, BPH, Mod-Sev Ao stenosis, and recent finding of bladder stones s/p cystolitholapxy and bipolar TURP, seen for non-ST elevation myocardial infarction.  Echocardiogram now shows severe aortic stenosis and cardiac catheterization shows severe coronary disease including left main disease.  Also noted to have 80 to 99% right internal carotid artery stenosis.  Assessment & Plan    1 non-ST elevation myocardial infarction-cardiac catheterization results noted.  Patient  has severe coronary artery disease including left main stenosis.  No chest pain.  Plan to continue aspirin, heparin, beta-blocker and statin.  Plan for coronary artery bypass and graft on Friday.    2 severe aortic stenosis-patient also with severe aortic stenosis.  Plan aortic valve replacement at time of coronary artery bypass and graft.  3 chronic stage III kidney disease-creatinine unchanged this morning.  Will repeat tomorrow.  4 status post cystolitholopaxy and bipolar TURP-Per urology  5 hypertension-blood pressure remains mildly elevated.  Coreg increased yesterday.  Continue present dose and follow.  6 hyperlipidemia-continue statin.  7 Carotid artery disease-80-99 RICA noted on Dopplers.  Will review with Dr. Cyndia Bent.  Question need for  combined carotid endarterectomy at time of AVR/CABG.   For questions or updates, please contact [place] Please consult [email or web address] for contact info under Cardiology/STEMI.      Signed, Kirk Ruths, MD  12/23/2017, 9:38 AM

## 2017-12-23 NOTE — Progress Notes (Signed)
[facility] was called on this patient by Dr. Lovena Neighbours, from urology.  He was admitted for BPH and underwent TURP and cystolithalopaxy.  He developed chest pain with an NSTEMI.  Subsequent evaluation shows severe AS and severe left main and multi-vessel CAD with need for CABG.   Plan is for CVTS surgery on Friday.  [facility] has been asked to assume care.  At this time, I am awaiting a call back from Dr. Lovena Neighbours, but it appears that he would be better served on the cardiology/CVTS service since they would assume post-operative care for him on Friday.  He has been seen by cardiology (Dr. Stanford Breed) this AM and review of his note does not indicate need for medicine consultation at this time.  I spoke with Dr. Lovena Neighbours - cardiology and CVTS will be the best option.  If for some reason they are unable to assume care of the patient, [facility] will be happy to assume care until he is post-operative from his CABG.  Carlyon Shadow, M.D.

## 2017-12-23 NOTE — Progress Notes (Signed)
1 Day Post-Op Procedure(s) (LRB): RIGHT/LEFT HEART CATH AND CORONARY ANGIOGRAPHY (N/A) Subjective: No chest pain or shortness of breath. Ambulated in room.  Objective: Vital signs in last 24 hours: Temp:  [97.8 F (36.6 C)-98.7 F (37.1 C)] 97.8 F (36.6 C) (08/21 1500) Pulse Rate:  [55-81] 55 (08/21 1700) Cardiac Rhythm: Normal sinus rhythm (08/21 1030) Resp:  [13-22] 17 (08/21 1700) BP: (110-178)/(61-88) 123/65 (08/21 1700) SpO2:  [94 %-97 %] 95 % (08/21 1700)  Hemodynamic parameters for last 24 hours:    Intake/Output from previous day: 08/20 0701 - 08/21 0700 In: 1102.1 [I.V.:1102.1] Out: 2725 [Urine:2725] Intake/Output this shift: Total I/O In: 292.5 [P.O.:10; I.V.:282.5] Out: 550 [Urine:550]  General appearance: alert and cooperative Heart: regular rate and rhythm, harsh systolic murmur Lungs: clear to auscultation bilaterally  Lab Results: Recent Labs    12/22/17 0222 12/23/17 0207  WBC 8.0 8.3  HGB 9.4* 9.7*  HCT 28.6* 29.1*  PLT 192 219   BMET:  Recent Labs    12/22/17 0705 12/23/17 0207  NA 137 139  K 4.2 4.0  CL 105 106  CO2 22 24  GLUCOSE 102* 89  BUN 18 17  CREATININE 2.10* 1.91*  CALCIUM 8.9 8.6*    PT/INR: No results for input(s): LABPROT, INR in the last 72 hours. ABG    Component Value Date/Time   PHART 7.408 12/22/2017 1043   HCO3 21.8 12/22/2017 1043   TCO2 23 12/22/2017 1043   ACIDBASEDEF 2.0 12/22/2017 1043   O2SAT 94.0 12/22/2017 1043   CBG (last 3)  No results for input(s): GLUCAP in the last 72 hours.  Assessment/Plan:  Severe aortic stenosis Severe Left Main and 3-vessel CAD Stage 3B CKD with creat of 2.0 High grade right ICA stenosis  Creat appears stable post-cath so far. Vein mapping of right GSV shows vein that is on smaller side but probably suitable.  Vascular surgery evaluating right carotid stenosis and plans non-contrast CT to evaluate further.  Planning CABG and AVR but may not be able to do it until  Tuesday due to urgent cases that need to get done. Discussed with patient and wife.   LOS: 5 days    [name] 12/23/2017

## 2017-12-24 ENCOUNTER — Inpatient Hospital Stay (HOSPITAL_COMMUNITY): Payer: No Typology Code available for payment source

## 2017-12-24 ENCOUNTER — Telehealth: Payer: Self-pay | Admitting: Vascular Surgery

## 2017-12-24 LAB — PULMONARY FUNCTION TEST
FEF 25-75 POST: 2.51 L/s
FEF 25-75 Pre: 2.67 L/sec
FEF2575-%CHANGE-POST: -5 %
FEF2575-%PRED-POST: 116 %
FEF2575-%PRED-PRE: 123 %
FEV1-%CHANGE-POST: -1 %
FEV1-%Pred-Post: 92 %
FEV1-%Pred-Pre: 94 %
FEV1-Post: 2.39 L
FEV1-Pre: 2.44 L
FEV1FVC-%CHANGE-POST: -4 %
FEV1FVC-%PRED-PRE: 105 %
FEV6-%Change-Post: 2 %
FEV6-%Pred-Post: 93 %
FEV6-%Pred-Pre: 92 %
FEV6-POST: 3.09 L
FEV6-Pre: 3.03 L
FEV6FVC-%Change-Post: 0 %
FEV6FVC-%PRED-POST: 104 %
FEV6FVC-%Pred-Pre: 105 %
FVC-%Change-Post: 2 %
FVC-%PRED-POST: 89 %
FVC-%PRED-PRE: 87 %
FVC-POST: 3.11 L
FVC-Pre: 3.03 L
PRE FEV1/FVC RATIO: 81 %
PRE FEV6/FVC RATIO: 100 %
Post FEV1/FVC ratio: 77 %
Post FEV6/FVC ratio: 100 %

## 2017-12-24 LAB — BASIC METABOLIC PANEL
ANION GAP: 8 (ref 5–15)
BUN: 21 mg/dL (ref 8–23)
CHLORIDE: 107 mmol/L (ref 98–111)
CO2: 25 mmol/L (ref 22–32)
Calcium: 8.6 mg/dL — ABNORMAL LOW (ref 8.9–10.3)
Creatinine, Ser: 1.97 mg/dL — ABNORMAL HIGH (ref 0.61–1.24)
GFR calc non Af Amer: 32 mL/min — ABNORMAL LOW (ref >60)
GFR, EST AFRICAN AMERICAN: 37 mL/min — AB (ref >60)
Glucose, Bld: 88 mg/dL (ref 70–99)
POTASSIUM: 4.1 mmol/L (ref 3.5–5.1)
SODIUM: 140 mmol/L (ref 135–145)

## 2017-12-24 LAB — CBC
HEMATOCRIT: 28.9 % — AB (ref 39.0–52.0)
HEMOGLOBIN: 9.4 g/dL — AB (ref 13.0–17.0)
MCH: 30.1 pg (ref 26.0–34.0)
MCHC: 32.5 g/dL (ref 30.0–36.0)
MCV: 92.6 fL (ref 78.0–100.0)
Platelets: 213 10*3/uL (ref 150–400)
RBC: 3.12 MIL/uL — AB (ref 4.22–5.81)
RDW: 11.9 % (ref 11.5–15.5)
WBC: 7.2 10*3/uL (ref 4.0–10.5)

## 2017-12-24 LAB — HEPARIN LEVEL (UNFRACTIONATED): HEPARIN UNFRACTIONATED: 0.64 [IU]/mL (ref 0.30–0.70)

## 2017-12-24 MED ORDER — ALBUTEROL SULFATE (2.5 MG/3ML) 0.083% IN NEBU
2.5000 mg | INHALATION_SOLUTION | Freq: Once | RESPIRATORY_TRACT | Status: AC
  Administered 2017-12-24: 2.5 mg via RESPIRATORY_TRACT

## 2017-12-24 NOTE — Progress Notes (Signed)
    Subjective  -   No acute events   Physical Exam:  Neuro intact Lungs clear abd soft    CT shows moderately high carotid plaque   Assessment/Plan:    Asymptomatic right carotid stenosis:  Given that he is asymptomatic from his carotid artery stenosis, I would recommend repair of his cardiac issues first.  When he has recovered from his cardiac surgery, we will entertain right carotid endarterectomy  Matthew [name] 12/24/2017 7:48 AM --  Vitals:   12/24/17 0600 12/24/17 0700  BP: (!) 141/66 (!) 136/55  Pulse: (!) 56 60  Resp: 15 18  Temp:    SpO2: 96% 96%    Intake/Output Summary (Last 24 hours) at 12/24/2017 0748 Last data filed at 12/24/2017 0700 Gross per 24 hour  Intake 684.48 ml  Output 1250 ml  Net -565.52 ml     Laboratory CBC    Component Value Date/Time   WBC 7.2 12/24/2017 0220   HGB 9.4 (L) 12/24/2017 0220   HCT 28.9 (L) 12/24/2017 0220   PLT 213 12/24/2017 0220    BMET    Component Value Date/Time   NA 140 12/24/2017 0220   K 4.1 12/24/2017 0220   CL 107 12/24/2017 0220   CO2 25 12/24/2017 0220   GLUCOSE 88 12/24/2017 0220   BUN 21 12/24/2017 0220   CREATININE 1.97 (H) 12/24/2017 0220   CALCIUM 8.6 (L) 12/24/2017 0220   GFRNONAA 32 (L) 12/24/2017 0220   GFRAA 37 (L) 12/24/2017 0220    COAG Lab Results  Component Value Date   INR 1.02 04/02/2016   INR 0.89 02/07/2016   No results found for: PTT  Antibiotics Anti-infectives (From admission, onward)   Start     Dose/Rate Route Frequency Ordered Stop   12/17/17 0000  cephALEXin (KEFLEX) 500 MG capsule     500 mg Oral 3 times daily 12/17/17 0810 12/20/17 2359   12/16/17 2200  ceFAZolin (ANCEF) IVPB 1 g/50 mL premix    Note to Pharmacy:  24 hours post-op prophylaxis   1 g 100 mL/hr over 30 Minutes Intravenous Every 8 hours 12/16/17 1614 12/17/17 1836   12/16/17 1015  ceFAZolin (ANCEF) IVPB 2g/100 mL premix     2 g 200 mL/hr over 30 Minutes Intravenous  Once 12/16/17 1008  12/16/17 1351   12/16/17 1011  ceFAZolin (ANCEF) 2-4 GM/100ML-% IVPB    Note to Pharmacy:  [place] Session   : cabinet override      12/16/17 1011 12/16/17 1336       V. Leia Alf, M.D. [facility] [place] Office: [phone] Pager:  [phone]

## 2017-12-24 NOTE — Progress Notes (Signed)
Progress Note  Patient Name: Matthew [name] Date of Encounter: 12/24/2017  Primary Cardiologist: [facility]  Subjective   No CP or dyspnea  Inpatient Medications    Scheduled Meds: . amLODipine  10 mg Oral q morning - 10a  . aspirin  81 mg Oral Daily  . atorvastatin  80 mg Oral QHS  . carvedilol  18.75 mg Oral BID WC  . doxazosin  2 mg Oral QHS  . gabapentin  600 mg Oral TID  . isosorbide mononitrate  30 mg Oral Daily  . latanoprost  1 drop Both Eyes QHS  . nicotine  14 mg Transdermal Daily  . oxybutynin  10 mg Oral Daily  . sodium chloride flush  3 mL Intravenous Q12H   Continuous Infusions: . sodium chloride 250 mL (12/24/17 0124)  . heparin 1,800 Units/hr (12/23/17 2108)  . sodium chloride irrigation     PRN Meds: sodium chloride, acetaminophen, bismuth subsalicylate, diphenhydrAMINE **OR** diphenhydrAMINE, LORazepam, morphine injection, nitroGLYCERIN, ondansetron, opium-belladonna, oxybutynin, oxyCODONE-acetaminophen **AND** oxyCODONE, pentosan polysulfate, sodium chloride flush   Vital Signs    Vitals:   12/24/17 0200 12/24/17 0400 12/24/17 0500 12/24/17 0600  BP: 137/64 (!) 144/67 (!) 146/75 (!) 141/66  Pulse: (!) 50 (!) 51 (!) 54 (!) 56  Resp: 14 13 16 15   Temp:  97.8 F (36.6 C)    TempSrc:  Oral    SpO2: 95% 96% 96% 96%  Weight:      Height:        Intake/Output Summary (Last 24 hours) at 12/24/2017 0625 Last data filed at 12/24/2017 0500 Gross per 24 hour  Intake 656.54 ml  Output 850 ml  Net -193.46 ml   Filed Weights   12/21/17 1558 12/21/17 1943 12/22/17 0338  Weight: 84.3 kg 84.3 kg 84.3 kg    Telemetry    Sinus- Personally Reviewed   Physical Exam   GEN: WD/WN NAD Neck: Supple, no JVD Cardiac: RRR, 3/6 systolic murmur Respiratory: CTA; no wheeze GI: Soft, NT/ND MS: No edema Neuro: Grossly intact   Labs    Chemistry Recent Labs  Lab 12/22/17 0705 12/23/17 0207 12/24/17 0220  NA 137 139 140  K 4.2 4.0 4.1  CL 105  106 107  CO2 22 24 25   GLUCOSE 102* 89 88  BUN 18 17 21   CREATININE 2.10* 1.91* 1.97*  CALCIUM 8.9 8.6* 8.6*  GFRNONAA 30* 33* 32*  GFRAA 34* 38* 37*  ANIONGAP 10 9 8      Hematology Recent Labs  Lab 12/22/17 0222 12/23/17 0207 12/24/17 0220  WBC 8.0 8.3 7.2  RBC 3.07* 3.20* 3.12*  HGB 9.4* 9.7* 9.4*  HCT 28.6* 29.1* 28.9*  MCV 93.2 90.9 92.6  MCH 30.6 30.3 30.1  MCHC 32.9 33.3 32.5  RDW 12.1 11.9 11.9  PLT 192 219 213    Cardiac Enzymes Recent Labs  Lab 12/18/17 0827 12/18/17 1401 12/18/17 1919  TROPONINI 2.58* 2.37* 2.23*    Patient Profile     72 y.o. male with a history of PVD, CAD, HTN, HL, CKD III, BPH, Mod-Sev Ao stenosis, and recent finding of bladder stones s/p cystolitholapxy and bipolar TURP, seen for non-ST elevation myocardial infarction.  Echocardiogram now shows severe aortic stenosis and cardiac catheterization shows severe coronary disease including left main disease.  Also noted to have 80 to 99% right internal carotid artery stenosis.  Assessment & Plan    1 non-ST elevation myocardial infarction-patient remains pain-free.  Plan to continue aspirin, heparin, beta-blocker and  statin.  Plan coronary artery bypass and graft/aortic valve replacement timing per Dr.  Cyndia Bent.   2 severe aortic stenosis-plan AVR at time of coronary artery bypass and graft.  3 chronic stage III kidney disease-renal function unchanged this morning following recent catheterization.  We will continue to follow.  4 status post cystolitholopaxy and bipolar TURP-Per urology  5 hypertension-continue present blood pressure medications.  6 hyperlipidemia-continue statin.  7 Carotid artery disease-80-99 RICA noted on Dopplers.  Dr. Trula Slade is evaluating.   For questions or updates, please contact [place] Please consult [email or web address] for contact info under Cardiology/STEMI.      Signed, Kirk Ruths, MD  12/24/2017, 6:25 AM

## 2017-12-24 NOTE — Progress Notes (Signed)
[place] [facility] for heparin Indication: chest pain/ACS  No Known Allergies  Patient Measurements: Height: 5\' 8"  (172.7 cm) Weight: 185 lb 13.6 oz (84.3 kg) IBW/kg (Calculated) : 68.4 Heparin Dosing Weight: 87 kg  Vital Signs: Temp: 98.3 F (36.8 C) (08/22 0700) Temp Source: Oral (08/22 0700) BP: 154/75 (08/22 0800) Pulse Rate: 80 (08/22 0800)  Labs: Recent Labs    12/22/17 0222 12/22/17 0705 12/22/17 2059 12/23/17 0207 12/24/17 0220  HGB 9.4*  --   --  9.7* 9.4*  HCT 28.6*  --   --  29.1* 28.9*  PLT 192  --   --  219 213  HEPARINUNFRC 0.20*  --  0.22* 0.34 0.64  CREATININE  --  2.10*  --  1.91* 1.97*    Estimated Creatinine Clearance: 35.3 mL/min (A) (by C-G formula based on SCr of 1.97 mg/dL (H)).  Assessment: 72 y.o. male with hx PVD, CAD, HTN, and s/p TURP/cystolithalopexy on 12/16/17 with noted CP and elevated troponin on 12/19/17. Patient went for cardiac cath 8/20, pharmacy instructed to restart heparin 4 hours after sheath has been removed.   Heparin level was therapeutic at 0.64, on 1800 units/hr. Scr increase from 1.91 to 1.97 today. Hgb 9.4, plt 213. Bloody urine noted per nursing- monitoring. No infusion issues per nursing.   Goal of Therapy:  Heparin level 0.3-0.7 units/ml Monitor platelets by anticoagulation protocol: Yes   Plan:  Reduce heparin infusion to 1750 units/hr to keep in goal range Monitor CBC, HL daily & s/sx of bleeding  Doylene Canard, PharmD Clinical Pharmacist  Pager: [phone] Phone: [phone] Please check AMION for all [place] numbers 12/24/2017

## 2017-12-24 NOTE — Telephone Encounter (Signed)
sch appt spk to pt wife mld ltr 02/02/18 345pm p/o MD

## 2017-12-25 LAB — CBC
HCT: 28.5 % — ABNORMAL LOW (ref 39.0–52.0)
Hemoglobin: 9.3 g/dL — ABNORMAL LOW (ref 13.0–17.0)
MCH: 30.7 pg (ref 26.0–34.0)
MCHC: 32.6 g/dL (ref 30.0–36.0)
MCV: 94.1 fL (ref 78.0–100.0)
Platelets: 264 K/uL (ref 150–400)
RBC: 3.03 MIL/uL — ABNORMAL LOW (ref 4.22–5.81)
RDW: 12.1 % (ref 11.5–15.5)
WBC: 8.4 K/uL (ref 4.0–10.5)

## 2017-12-25 LAB — BASIC METABOLIC PANEL
BUN: 26 mg/dL — ABNORMAL HIGH (ref 8–23)
CO2: 20 mmol/L — ABNORMAL LOW (ref 22–32)
Calcium: 8.7 mg/dL — ABNORMAL LOW (ref 8.9–10.3)
Chloride: 109 mmol/L (ref 98–111)
Creatinine, Ser: 2.16 mg/dL — ABNORMAL HIGH (ref 0.61–1.24)
GFR calc Af Amer: 33 mL/min — ABNORMAL LOW (ref >60)
Glucose, Bld: 161 mg/dL — ABNORMAL HIGH (ref 70–99)

## 2017-12-25 LAB — BASIC METABOLIC PANEL WITH GFR
Anion gap: 10 (ref 5–15)
GFR calc non Af Amer: 29 mL/min — ABNORMAL LOW (ref >60)
Potassium: 4.6 mmol/L (ref 3.5–5.1)
Sodium: 139 mmol/L (ref 135–145)

## 2017-12-25 LAB — HEMOGLOBIN AND HEMATOCRIT, BLOOD
HCT: 23.3 % — ABNORMAL LOW (ref 39.0–52.0)
HEMOGLOBIN: 7.6 g/dL — AB (ref 13.0–17.0)

## 2017-12-25 LAB — MAGNESIUM: Magnesium: 2.1 mg/dL (ref 1.7–2.4)

## 2017-12-25 LAB — PREPARE RBC (CROSSMATCH)

## 2017-12-25 MED ORDER — FUROSEMIDE 10 MG/ML IJ SOLN: 20.0000 mg | Freq: Once | INTRAMUSCULAR | Status: AC

## 2017-12-25 MED ORDER — SODIUM CHLORIDE 0.9% IV SOLUTION: Freq: Once | INTRAVENOUS | Status: AC

## 2017-12-25 MED ORDER — HEPARIN (PORCINE) IN NACL 100-0.45 UNIT/ML-% IJ SOLN
1750.0000 [IU]/h | INTRAMUSCULAR | Status: DC
  Administered 2017-12-25: 1750 [IU]/h via INTRAVENOUS

## 2017-12-25 MED ORDER — DIPHENHYDRAMINE HCL 25 MG PO CAPS
25.0000 mg | ORAL_CAPSULE | Freq: Once | ORAL | Status: AC
  Administered 2017-12-25: 25 mg via ORAL
  Filled 2017-12-25: qty 1

## 2017-12-25 MED ORDER — FUROSEMIDE 10 MG/ML IJ SOLN
20.0000 mg | Freq: Once | INTRAMUSCULAR | Status: AC
  Administered 2017-12-25: 20 mg via INTRAVENOUS
  Filled 2017-12-25: qty 2

## 2017-12-25 MED ORDER — HEPARIN (PORCINE) IN NACL 100-0.45 UNIT/ML-% IJ SOLN
1400.0000 [IU]/h | INTRAMUSCULAR | Status: DC
  Administered 2017-12-26: 1750 [IU]/h via INTRAVENOUS
  Administered 2017-12-26: 1700 [IU]/h via INTRAVENOUS
  Administered 2017-12-27 – 2017-12-29 (×3): 1400 [IU]/h via INTRAVENOUS
  Filled 2017-12-25 (×5): qty 250

## 2017-12-25 NOTE — Progress Notes (Signed)
Dr Karsten Ro has reviewed; foley irrigated with clot removal; now on continuous irrigation. Pain improved as is lower abdomen. Hgb stabe. Will resume heparin. Matthew [name]

## 2017-12-25 NOTE — Progress Notes (Signed)
ANTICOAGULATION CONSULT NOTE   Pharmacy Consult for heparin Indication: chest pain/ACS  No Known Allergies  Patient Measurements: Height: 5\' 8"  (172.7 cm) Weight: 185 lb 13.6 oz (84.3 kg) IBW/kg (Calculated) : 68.4 Heparin Dosing Weight: 87 kg  Vital Signs: Temp: 98.2 F (36.8 C) (08/23 1200) Temp Source: Oral (08/23 1200) BP: 75/55 (08/23 1400) Pulse Rate: 75 (08/23 1400)  Labs: Recent Labs    12/22/17 2059  12/23/17 0207 12/24/17 0220 12/25/17 0613 12/25/17 1326  HGB  --    < > 9.7* 9.4* 9.3* 7.6*  HCT  --    < > 29.1* 28.9* 28.5* 23.3*  PLT  --   --  219 213 264  --   HEPARINUNFRC 0.22*  --  0.34 0.64  --   --   CREATININE  --   --  1.91* 1.97* 2.16*  --    < > = values in this interval not displayed.    Estimated Creatinine Clearance: 32.2 mL/min (A) (by C-G formula based on SCr of 2.16 mg/dL (H)).  Assessment: 72 y.o. male with hx PVD, CAD, HTN, and s/p TURP/cystolithalopexy on 12/16/17 with noted CP and elevated troponin on 12/19/17. Patient went for cardiac cath 8/20, pharmacy instructed to restart heparin 4 hours after sheath has been removed. Heparin was placed on hold 2/2 hematuria and concern for bleeding.  Dr. Stanford Breed wishes to restart heparin without a bolus.   Goal of Therapy:  Heparin level 0.3-0.7 units/ml Monitor platelets by anticoagulation protocol: Yes   Plan:  Start heparin infusion to 1750 units/hr  Check anti-Xa level in 8 hours and daily while on heparin Continue to monitor H&H and platelets  ADDENDUM: Hgb down, per cards team will hold IV heparin x12 hr and resume with no bolus after giving pRBCs.  Plan: Hold heparin Resume with no bolus at 1750 units/hr at 0330 8/24 Check 8hr heparin level (target 0.3-0.5)  Arrie Senate, PharmD, BCPS Clinical Pharmacist [phone] Please check AMION for all [place] numbers 12/25/2017

## 2017-12-25 NOTE — Progress Notes (Signed)
Patient ID: Matthew [name], male   DOB: 1946/03/23, 72 y.o.   MRN: [id number]  Procedure: He had a three-way Foley catheter in place and I first began by aspirating with a [place] syringe and was able to aspirate dark bloody urine but was unable to aspirate any clots.  I removed multiple 60 cc syringe fulls of bloody urine but at no time was able to aspirate any clots.  I found that aspirating through the catheter required a great deal of force so I deflated the catheter balloon and advanced the catheter slightly after which it changed to a freely irrigateable catheter.  I irrigated further by instilling sterile saline and then aspirating and still noted no clots.  This was likely because of the heparin the patient had been receiving.  I therefore hooked the catheter back up to continuous bladder irrigation and ran this wide open and this completely clear the urine.  No clots were seen at any time.  The patient reported complete resolution of the pain that he was experiencing.  His abdominal mass resolved.  Assessment: Gross hematuria with catheter malfunction: Why he developed gross hematuria after having had clear urine for several days while on IV heparin is unclear however at this point his catheter is draining and he is on continuous bladder irrigation that will be titrated to maintain the irrigant clear to light pink.  I spoke to Dr. Stanford Breed about the fact that my feeling is that he has risk off heparin from a cardiac standpoint is much greater then the risk of bleeding from his bladder on the heparin.  The bleeding from his bladder can be managed with continuous bladder irrigation and this can even be run during his planned surgery when he will receive large doses of heparin intraoperatively.  I also indicated that I felt the risk of bleeding and the requirement of a transfusion carried less risk than him being off the heparin.   Plan:  1.  Resume CBI. 2.  I have instructed his nurse on the technique  I used to unoccluded his current catheter.  This can be performed as needed. 3.  I spoke to Dr. Lovena Neighbours who will check on the patient later today as well.   Subjective: The patient reports having developed suprapubic pain and intermittent sharp stabbing type pains in the urethra beginning about 5 AM this morning.  He had undergone a previous cystolitholapaxy and TURP and his urine has been clear on heparin which has been required in preparation for his upcoming cardiac surgery.  He had been on continuous bladder irrigation but that is been stopped.  When he was seen on rounds by Dr. Stanford Breed this morning he was having suprapubic pain and was noted on exam to have a lower abdominal mass.  The concern was that this represented some form of hemorrhage since he had been on the heparin and I was contacted urgently to see the patient for evaluation and further management.  Objective: Vital signs in last 24 hours: Temp:  [98 F (36.7 C)-98.3 F (36.8 C)] 98.2 F (36.8 C) (08/23 0700) Pulse Rate:  [55-135] 86 (08/23 0900) Resp:  [14-27] 14 (08/23 0900) BP: (90-179)/(42-88) 90/67 (08/23 0900) SpO2:  [94 %-99 %] 98 % (08/23 0900)A  Intake/Output from previous day: 08/22 0701 - 08/23 0700 In: 34909.2 [I.V.:409.2] Out: 23557 [Urine:31750] Intake/Output this shift: Total I/O In: 6240 [P.O.:240; Other:6000] Out: 18 [Urine:4800]  Past Medical History:  Diagnosis Date  . Arthritis   . Bladder  stones   . BPH (benign prostatic hyperplasia)   . CAD (coronary artery disease)    a. Pt reports prior stenting @ [facility] - no records in Epic.  Marland Kitchen Chronic kidney disease (CKD), stage III (moderate) (HCC)    dx from [place] in [place], [place]  . Heart murmur    a. AS  . Hyperlipidemia   . Hypertension   . Moderate to severe aortic stenosis    a. PER 01-15-16 [facility].  Marland Kitchen Neuropathy    FEET AND HANDS  . Peripheral vascular disease ([place])    a. 04/2016 s/p L Fem->PT bypass; b. 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 04/2017 LLE Duplex: No restenosis. ABI R 0.63, ABI L 1.01.  Marland Kitchen Post traumatic stress disorder (PTSD)   . Pre-diabetes     Physical Exam:  General: Awake, alert and in mild to moderate distress Lungs: Normal respiratory effort, chest expands symmetrically.  Abdomen: Suprapubic mass palpable.  This is tender to palpation.  No ecchymoses.  Lab Results: Recent Labs    12/23/17 0207 12/24/17 0220 12/25/17 0613  WBC 8.3 7.2 8.4  HGB 9.7* 9.4* 9.3*  HCT 29.1* 28.9* 28.5*   BMET Recent Labs    12/24/17 0220 12/25/17 0613  NA 140 139  K 4.1 4.6  CL 107 109  CO2 25 20*  GLUCOSE 88 161*  BUN 21 26*  CREATININE 1.97* 2.16*  CALCIUM 8.6* 8.7*   No results for input(s): LABURIN in the last 72 hours. Results for orders placed or performed during the hospital encounter of 12/16/17  MRSA PCR Screening     Status: None   Collection Time: 12/18/17 11:33 AM  Result Value Ref Range Status   MRSA by PCR NEGATIVE NEGATIVE Final    Comment:        The GeneXpert MRSA Assay (FDA approved for NASAL specimens only), is one component of a comprehensive MRSA colonization surveillance program. It is not intended to diagnose MRSA infection nor to guide or monitor treatment for MRSA infections. Performed at [facility], [place] [address], [place],  [address]     Studies/Results: No results found.    Shriley Joffe C 12/25/2017, 9:45 AM

## 2017-12-25 NOTE — Progress Notes (Addendum)
ANTICOAGULATION CONSULT NOTE   Pharmacy Consult for heparin Indication: chest pain/ACS  No Known Allergies  Patient Measurements: Height: 5\' 8"  (172.7 cm) Weight: 185 lb 13.6 oz (84.3 kg) IBW/kg (Calculated) : 68.4 Heparin Dosing Weight: 87 kg  Vital Signs: Temp: 98.2 F (36.8 C) (08/23 0332) Temp Source: Oral (08/23 0332) BP: 136/88 (08/23 0700) Pulse Rate: 135 (08/23 0700)  Labs: Recent Labs    12/22/17 2059  12/23/17 0207 12/24/17 0220 12/25/17 0613  HGB  --    < > 9.7* 9.4* 9.3*  HCT  --   --  29.1* 28.9* 28.5*  PLT  --   --  219 213 264  HEPARINUNFRC 0.22*  --  0.34 0.64  --   CREATININE  --   --  1.91* 1.97* 2.16*   < > = values in this interval not displayed.    Estimated Creatinine Clearance: 32.2 mL/min (A) (by C-G formula based on SCr of 2.16 mg/dL (H)).  Assessment: 72 y.o. male with hx PVD, CAD, HTN, and s/p TURP/cystolithalopexy on 12/16/17 with noted CP and elevated troponin on 12/19/17. Patient went for cardiac cath 8/20, pharmacy instructed to restart heparin 4 hours after sheath has been removed. Heparin was placed on hold 2/2 hematuria and concern for bleeding.  Dr. Stanford Breed wishes to restart heparin without a bolus.   Goal of Therapy:  Heparin level 0.3-0.7 units/ml Monitor platelets by anticoagulation protocol: Yes   Plan:  Start heparin infusion to 1750 units/hr  Check anti-Xa level in 8 hours and daily while on heparin Continue to monitor H&H and platelets   Claiborne Billings, PharmD PGY2 Cardiology Pharmacy Resident Phone [phone] 12/25/2017 8:05 AM

## 2017-12-25 NOTE — Progress Notes (Addendum)
Progress Note  Patient Name: Matthew [name] Date of Encounter: 12/25/2017  Primary [facility]ologist: [facility]  Subjective   No CP or dyspnea; complains of pain in penis; denies lower abdominal pain  Inpatient Medications    Scheduled Meds: . amLODipine  10 mg Oral q morning - 10a  . aspirin  81 mg Oral Daily  . atorvastatin  80 mg Oral QHS  . carvedilol  18.75 mg Oral BID WC  . doxazosin  2 mg Oral QHS  . gabapentin  600 mg Oral TID  . isosorbide mononitrate  30 mg Oral Daily  . latanoprost  1 drop Both Eyes QHS  . nicotine  14 mg Transdermal Daily  . oxybutynin  10 mg Oral Daily  . sodium chloride flush  3 mL Intravenous Q12H   Continuous Infusions: . sodium chloride Stopped (12/24/17 0700)  . heparin 1,750 Units/hr (12/25/17 0500)  . sodium chloride irrigation     PRN Meds: sodium chloride, acetaminophen, bismuth subsalicylate, diphenhydrAMINE **OR** diphenhydrAMINE, LORazepam, morphine injection, nitroGLYCERIN, ondansetron, opium-belladonna, oxybutynin, oxyCODONE-acetaminophen **AND** oxyCODONE, pentosan polysulfate, sodium chloride flush   Vital Signs    Vitals:   12/25/17 0332 12/25/17 0400 12/25/17 0500 12/25/17 0600  BP:  118/67 115/69 (!) 179/88  Pulse:  (!) 57 72 90  Resp:  18 17 (!) 27  Temp: 98.2 F (36.8 C)     TempSrc: Oral     SpO2:  94% 96% 97%  Weight:      Height:        Intake/Output Summary (Last 24 hours) at 12/25/2017 0628 Last data filed at 12/25/2017 0500 Gross per 24 hour  Intake 34910 ml  Output 30450 ml  Net 4460 ml   Filed Weights   12/21/17 1558 12/21/17 1943 12/22/17 0338  Weight: 84.3 kg 84.3 kg 84.3 kg    Telemetry    Sinus tachycardia- Personally Reviewed   Physical Exam   GEN: WD/WN Mild distress from pain in penis Neck: Supple Cardiac: Tachycardic, 3/6 systolic murmur Respiratory: CTA GI: Mild tenderness lower abdomen, mass palpated (? Hematoma) MS: No edema Neuro: No focal findings   Labs     Chemistry Recent Labs  Lab 12/22/17 0705 12/23/17 0207 12/24/17 0220  NA 137 139 140  K 4.2 4.0 4.1  CL 105 106 107  CO2 22 24 25   GLUCOSE 102* 89 88  BUN 18 17 21   CREATININE 2.10* 1.91* 1.97*  CALCIUM 8.9 8.6* 8.6*  GFRNONAA 30* 33* 32*  GFRAA 34* 38* 37*  ANIONGAP 10 9 8      Hematology Recent Labs  Lab 12/22/17 0222 12/23/17 0207 12/24/17 0220  WBC 8.0 8.3 7.2  RBC 3.07* 3.20* 3.12*  HGB 9.4* 9.7* 9.4*  HCT 28.6* 29.1* 28.9*  MCV 93.2 90.9 92.6  MCH 30.6 30.3 30.1  MCHC 32.9 33.3 32.5  RDW 12.1 11.9 11.9  PLT 192 219 213    Cardiac Enzymes Recent Labs  Lab 12/18/17 0827 12/18/17 1401 12/18/17 1919  TROPONINI 2.58* 2.37* 2.23*    Patient Profile     72 y.o. male with a history of PVD, CAD, HTN, HL, CKD III, BPH, Mod-Sev Ao stenosis, and recent finding of bladder stones s/p cystolitholapxy and bipolar TURP, seen for non-ST elevation myocardial infarction.  Echocardiogram now shows severe aortic stenosis and cardiac catheterization shows severe coronary disease including left main disease.  Also noted to have 80 to 99% right internal carotid artery stenosis.  Assessment & Plan    1 status  post cystolitholopaxy and bipolar TURP -patient with increased hematuria this morning.  He is complaining of penile pain.  On palpation he is tender in his lower abdomen and there is a possible hematoma.  I am hesitant to hold heparin given left main disease.  However I am concerned that he is actively bleeding.  We will therefore hold heparin for now.  I have called urology this morning and asked him to evaluate the patient.  Check CBC now and again in 6 hours.  2 non-ST elevation myocardial infarction-patient remains chest pain free.  As outlined above I am concerned that he is developed a lower abdominal hematoma.  I have held heparin until urology can evaluate.  We will continue remaining medications.  Plan eventual coronary artery bypass and graft/aortic valve replacement  timing per Dr.  Cyndia Bent.   3 severe aortic stenosis-plan AVR at time of coronary artery bypass and graft.  4 chronic stage III kidney disease-renal function stable this AM  5 hypertension-continue present blood pressure medications.  6 hyperlipidemia-continue statin.  7 Carotid artery disease-80-99 RICA noted on Dopplers.  Dr. Trula Slade has evaluated and plans CEA at a later date.   For questions or updates, please contact [place] Please consult [email or web address] for contact info under Cardiology/STEMI.      Signed, Kirk Ruths, MD  12/25/2017, 6:28 AM    Addendum I have discussed the patient with Dr. Karsten Ro.  He feels the most likely etiology of ongoing issues is clot retention causing bladder distention.  He asked that heparin be held 1 to 2 hours.  Catheter will be changed out with continuous irrigation.  I have asked them to evaluate the patient as soon as possible.  Hopefully symptoms will improve with above measures.  Kirk Ruths, MD

## 2017-12-25 NOTE — Progress Notes (Signed)
3 Days Post-Op Subjective: This morning's events were discussed with Dr. Karsten Ro.  His urine is now clear with minimal CBI.  He denies suprapubic discomfort or abdominal pain. Heparin drip has been off the majority of the day.  The patient is resting comfortably in bed with no complaints aside from being tired.  Currently on his 2nd unit of pRBCs.   Objective: Vital signs in last 24 hours: Temp:  [97.2 F (36.2 C)-98.5 F (36.9 C)] 97.6 F (36.4 C) (08/23 1845) Pulse Rate:  [52-135] 58 (08/23 1900) Resp:  [11-27] 18 (08/23 1900) BP: (71-179)/(42-88) 114/59 (08/23 1900) SpO2:  [94 %-100 %] 98 % (08/23 1900)  Intake/Output from previous day: 08/22 0701 - 08/23 0700 In: 34909.2 [I.V.:409.2] Out: 79892 [Urine:31750]  Intake/Output this shift: No intake/output data recorded.  Physical Exam:  General: Alert and oriented CV: RRR, palpable distal pulses Lungs: CTAB, equal chest rise Abdomen: Soft, NTND, no rebound or guarding Gu: 22 F 3-way Foley in place and draining clear urine with minimal CBI Ext: NT, No erythema  Lab Results: Recent Labs    12/24/17 0220 12/25/17 0613 12/25/17 1326  HGB 9.4* 9.3* 7.6*  HCT 28.9* 28.5* 23.3*   BMET Recent Labs    12/24/17 0220 12/25/17 0613  NA 140 139  K 4.1 4.6  CL 107 109  CO2 25 20*  GLUCOSE 88 161*  BUN 21 26*  CREATININE 1.97* 2.16*  CALCIUM 8.6* 8.7*     Studies/Results: Ct Soft Tissue Neck Wo Contrast  Result Date: 12/23/2017 CLINICAL DATA:  72 y/o M; evaluate for calcification in the carotid arteries. EXAM: CT NECK WITHOUT CONTRAST TECHNIQUE: Multidetector CT imaging of the neck was performed following the standard protocol without intravenous contrast. COMPARISON:  None. FINDINGS: Pharynx and larynx: Normal. No mass or swelling. Salivary glands: No inflammation, mass, or stone. Thyroid: Normal. Lymph nodes: None enlarged or abnormal density. Vascular: Mild aortic, moderate bilateral carotid bifurcation, and severe  bilateral carotid siphon calcific atherosclerosis. Moderate calcific atherosclerosis of vertebral artery origins. Limited intracranial: Negative. Visualized orbits: Negative. Mastoids and visualized paranasal sinuses: Clear. Skeleton: Dental disease with multiple periapical cysts and dental caries. Advanced cervical spondylosis with prominent disc osteophyte complexes greatest at the C5-6 level. The C5-6 disc osteophyte complex results in moderate to severe canal stenosis. Upper chest: Negative. Other: None. IMPRESSION: 1. Mild aortic, moderate bilateral carotid bifurcation, and severe bilateral carotid siphon calcific atherosclerosis. Moderate calcific atherosclerosis of vertebral artery origins. 2. Advanced cervical spondylosis greatest at the C5-6 level where a disc osteophyte complex results in moderate to severe canal stenosis. Electronically Signed   By: Kristine Garbe M.D.   On: 12/23/2017 21:33    Assessment/Plan: 1 - Enlarged Prostate With Bladder Stones, Hematuria- s/p TURP and cystolithalopaxy on 12/16/17. Urine had remained clear for the past several days despite anticoagulation.  I agree with Dr. Karsten Ro in that there is no clear explanation as to why he developed gross hematuria.  CBI needs to continue at a slow drip leading up to and during his surgery, which has been moved to 8/27.  Hand irrigate Foley PRN suprapubic/abdominal discomfort, hematuria with clots and/or low output.  His cardiac issues obviously take priority and the risks of resuming anticoagulation is understood.    2 - Moderate Risk Prostate Cancer - new DX, given his comorbidity, surveillance may be most reasonable options.   3 - Stage 3 Renal Insufficiency- appears to be medical renal disease. IVF per cardiology   4 - NSTEMI- greatly appreciate  cardiology and CT surgery. Tentative plans for CABG and aortic valve replacement on 8/27  5.  Right renal lesion with features concerning for RCC-  The mass is  being followed by Dr. Lenis Dickinson with the [facility].  He has a repeat CT scheduled for September.    LOS: 7 days   [name], MD [facility] Pager: [phone]  12/25/2017, 7:19 PM

## 2017-12-25 NOTE — Care Management Note (Signed)
Case Management Note Marvetta Gibbons RN,BSN Unit 2H 1-22 - RN Care Coordinator (Case Management) [phone]  Patient Details  Name: Matthew [name] MRN: [id number] Date of Birth: [birth year]  Subjective/Objective:    Pt admitted s/p TURP with post procedure NSTEMI- tx from [place] to [facility]- plan for CABG and AVR at some point- however with increased hematuria ?lower abdominal hematoma urology following.                Action/Plan: PTA pt lived at home with wife who is an Therapist, sports, pt is followed at the [facility] for PCP- Will need d/c summary faxed at time of discharge to fax # [phone]. CM will follow for transition of care needs  Expected Discharge Date:                 Expected Discharge Plan:  Home/Self Care  In-House Referral:     Discharge planning Services  CM Consult  Post Acute Care Choice:    Choice offered to:     DME Arranged:    DME Agency:     HH Arranged:    HH Agency:     Status of Service:  In process, will continue to follow  If discussed at Long Length of Stay Meetings, dates discussed:    Discharge Disposition:   Additional Comments:  Dawayne Patricia, RN 12/25/2017, 2:01 PM

## 2017-12-25 NOTE — Progress Notes (Signed)
Repeat hemoglobin and hematocrit reviewed. Patient had significant drop in his hemoglobin. I contacted Dr. Stanford Breed who requests that we transfuse 2 units packed red cells.  He also request Dr. Acie Fredrickson to review and advise on stopping the heparin. Per the nursing staff, the Foley is irrigating well, but the urine is still pink.  I contacted Dr. Acie Fredrickson who will review the cath films but the tentative plan is to hold the heparin for 12 hours and then restart with no bolus.  Recheck the CBC after the transfusion and follow closely.   Lab Results  Component Value Date   WBC 8.4 12/25/2017   HGB 7.6 (L) 12/25/2017   HCT 23.3 (L) 12/25/2017   MCV 94.1 12/25/2017   PLT 264 12/25/2017   Rosaria Ferries, PA-C 12/25/2017 3:31 PM Beeper [phone]

## 2017-12-26 DIAGNOSIS — I6521 Occlusion and stenosis of right carotid artery: Secondary | ICD-10-CM

## 2017-12-26 DIAGNOSIS — I25110 Atherosclerotic heart disease of native coronary artery with unstable angina pectoris: Secondary | ICD-10-CM

## 2017-12-26 LAB — TYPE AND SCREEN
ABO/RH(D): AB NEG
ANTIBODY SCREEN: NEGATIVE
UNIT DIVISION: 0
Unit division: 0

## 2017-12-26 LAB — BPAM RBC
BLOOD PRODUCT EXPIRATION DATE: 201908292359
Blood Product Expiration Date: 201908292359
ISSUE DATE / TIME: 201908231833
ISSUE DATE / TIME: 201908231635
UNIT TYPE AND RH: 600
Unit Type and Rh: 600

## 2017-12-26 LAB — CBC
HEMATOCRIT: 29 % — AB (ref 39.0–52.0)
HEMOGLOBIN: 9.7 g/dL — AB (ref 13.0–17.0)
MCH: 30.4 pg (ref 26.0–34.0)
MCHC: 33.4 g/dL (ref 30.0–36.0)
MCV: 90.9 fL (ref 78.0–100.0)
Platelets: 194 10*3/uL (ref 150–400)
RBC: 3.19 MIL/uL — ABNORMAL LOW (ref 4.22–5.81)
RDW: 13.2 % (ref 11.5–15.5)
WBC: 11.4 10*3/uL — ABNORMAL HIGH (ref 4.0–10.5)

## 2017-12-26 LAB — BASIC METABOLIC PANEL
Anion gap: 8 (ref 5–15)
BUN: 34 mg/dL — AB (ref 8–23)
CHLORIDE: 108 mmol/L (ref 98–111)
CO2: 22 mmol/L (ref 22–32)
Calcium: 8.3 mg/dL — ABNORMAL LOW (ref 8.9–10.3)
Creatinine, Ser: 2.62 mg/dL — ABNORMAL HIGH (ref 0.61–1.24)
GFR calc Af Amer: 26 mL/min — ABNORMAL LOW (ref >60)
GFR calc non Af Amer: 23 mL/min — ABNORMAL LOW (ref >60)
Glucose, Bld: 99 mg/dL (ref 70–99)
Potassium: 4.4 mmol/L (ref 3.5–5.1)
SODIUM: 138 mmol/L (ref 135–145)

## 2017-12-26 LAB — HEPARIN LEVEL (UNFRACTIONATED): Heparin Unfractionated: 0.67 IU/mL (ref 0.30–0.70)

## 2017-12-26 NOTE — Plan of Care (Addendum)
Pt currently in chair, with family at bedside. Pt vital signs are stable. Heparin gtt infusing at ordered rate. Pt has no signs of bleeding. PRN meds given as ordered. CBI going as ordered, urine pink. No complaints of bladder pain. No new changes at this time. Will continue to monitor.   Problem: Education: Goal: Knowledge of General Education information will improve Description Including pain rating scale, medication(s)/side effects and non-pharmacologic comfort measures Outcome: Progressing   Problem: Education: Goal: Understanding of cardiac disease, CV risk reduction, and recovery process will improve Outcome: Progressing Goal: Understanding of medication regimen will improve Outcome: Progressing Goal: Individualized Educational Video(s) Outcome: Progressing   Problem: Activity: Goal: Ability to tolerate increased activity will improve Outcome: Progressing   Problem: Cardiac: Goal: Ability to achieve and maintain adequate cardiopulmonary perfusion will improve Outcome: Progressing Goal: Vascular access site(s) Level 0-1 will be maintained Outcome: Progressing   Problem: Health Behavior/Discharge Planning: Goal: Ability to safely manage health-related needs after discharge will improve Outcome: Progressing

## 2017-12-26 NOTE — Progress Notes (Signed)
Progress Note  Patient Name: Matthew [name] Date of Encounter: 12/26/2017  Primary Cardiologist: No primary care provider on file.   Subjective   Problems with hematuria and rapid drop in Hgb yesterday. Heaprin held and [facility] given. Hgb appropriately rose to 9.7. No further hematuria. No angina.  Inpatient Medications    Scheduled Meds: . amLODipine  10 mg Oral q morning - 10a  . aspirin  81 mg Oral Daily  . atorvastatin  80 mg Oral QHS  . carvedilol  18.75 mg Oral BID WC  . doxazosin  2 mg Oral QHS  . gabapentin  600 mg Oral TID  . isosorbide mononitrate  30 mg Oral Daily  . latanoprost  1 drop Both Eyes QHS  . nicotine  14 mg Transdermal Daily  . oxybutynin  10 mg Oral Daily  . sodium chloride flush  3 mL Intravenous Q12H   Continuous Infusions: . sodium chloride Stopped (12/25/17 1649)  . heparin 1,750 Units/hr (12/26/17 0700)  . sodium chloride irrigation     PRN Meds: sodium chloride, acetaminophen, bismuth subsalicylate, diphenhydrAMINE **OR** diphenhydrAMINE, LORazepam, morphine injection, nitroGLYCERIN, ondansetron, opium-belladonna, oxybutynin, oxyCODONE-acetaminophen **AND** oxyCODONE, pentosan polysulfate, sodium chloride flush   Vital Signs    Vitals:   12/26/17 0600 12/26/17 0700 12/26/17 0730 12/26/17 0755  BP: (!) 107/54 (!) 116/53 (!) 124/100   Pulse: (!) 52     Resp: 14 15 18    Temp:    97.9 F (36.6 C)  TempSrc:    Oral  SpO2: 96%     Weight:      Height:        Intake/Output Summary (Last 24 hours) at 12/26/2017 0811 Last data filed at 12/26/2017 0700 Gross per 24 hour  Intake 40039.99 ml  Output 34450 ml  Net 5589.99 ml   Filed Weights   12/21/17 1558 12/21/17 1943 12/22/17 0338  Weight: 84.3 kg 84.3 kg 84.3 kg    Telemetry    NSR - Personally Reviewed  ECG    NSR, LAD, LVH with secondary repol abnormalities plus superimposed ischemic changes - Personally Reviewed  Physical Exam  Appears very comfortable GEN: No acute  distress.   Neck: No JVD, bilateral carotid bruits  Cardiac: RRR, 3/6 mid peaking systolic aortic murmur, no diastolic murmurs, rubs, or gallops.  Respiratory: Clear to auscultation bilaterally. GI: Soft, nontender, non-distended  MS: No edema; No deformity. Neuro:  Nonfocal  Psych: Normal affect   Labs    Chemistry Recent Labs  Lab 12/23/17 0207 12/24/17 0220 12/25/17 0613  NA 139 140 139  K 4.0 4.1 4.6  CL 106 107 109  CO2 24 25 20*  GLUCOSE 89 88 161*  BUN 17 21 26*  CREATININE 1.91* 1.97* 2.16*  CALCIUM 8.6* 8.6* 8.7*  GFRNONAA 33* 32* 29*  GFRAA 38* 37* 33*  ANIONGAP 9 8 10      Hematology Recent Labs  Lab 12/24/17 0220 12/25/17 0613 12/25/17 1326 12/26/17 0302  WBC 7.2 8.4  --  11.4*  RBC 3.12* 3.03*  --  3.19*  HGB 9.4* 9.3* 7.6* 9.7*  HCT 28.9* 28.5* 23.3* 29.0*  MCV 92.6 94.1  --  90.9  MCH 30.1 30.7  --  30.4  MCHC 32.5 32.6  --  33.4  RDW 11.9 12.1  --  13.2  PLT 213 264  --  194    Cardiac EnzymesNo results for input(s): TROPONINI in the last 168 hours. No results for input(s): TROPIPOC in the last 168 hours.  BNPNo results for input(s): BNP, PROBNP in the last 168 hours.   DDimer No results for input(s): DDIMER in the last 168 hours.   Radiology    No results found.  Cardiac Studies   ECHO 12/18/2017 - Left ventricle: The cavity size was normal. Wall thickness was   increased in a pattern of moderate LVH. Systolic function was   normal. The estimated ejection fraction was in the range of 60%   to 65%. Wall motion was normal; there were no regional wall   motion abnormalities. Features are consistent with a pseudonormal   left ventricular filling pattern, with concomitant abnormal   relaxation and increased filling pressure (grade 2 diastolic   dysfunction). - Aortic valve: Trileaflet; severely calcified leaflets. There was   severe stenosis. There was trivial regurgitation. Mean gradient   (S): 49 mm Hg. Valve area (VTI): 0.6 cm^2. -  Mitral valve: Mildly calcified annulus. There was mild   regurgitation. - Right ventricle: The cavity size was normal. Systolic function   was normal. - Pulmonary arteries: No complete TR doppler jet so unable to   estimate PA systolic pressure. - Systemic veins: IVC measured 2.0 cm with < 50% respirophasic   variation, suggesting RA pressure 8 mmHg.  Impressions:  - Normal LV size with moderate LV hypertrophy. EF 60-65%. Normal RV   size and systolic function. Mild mitral regurgitation. Severe   aortic stenosis.  CATH 12/22/2017  Ost LM lesion is 95% stenosed.  Prox Cx to Mid Cx lesion is 60% stenosed.  Ost LAD lesion is 95% stenosed.  Prox LAD to Mid LAD lesion is 75% stenosed.  Mid RCA to Dist RCA lesion is 100% stenosed.     Patient Profile     72 y.o. male with severe CAD and severe AS, s/p small NSTEMI soon after urological surgery, developing hematuria about 7 days postop while on IV heparin. Schedules for CABG/AVR on 08/27.  Assessment & Plan    1. CAD s/p NSTEMI; 95% ostial LCA, 95% LAD, occluded RCA. High risk for complications. Resume IV heparin until surgery. On statin, ASA 2. AS (severe): plan AVR at time of CABG. Preserved LVEF 3. 80-99% R ICA stenosis: asymptomatic 4. CKD 3: baseline creat seems to be 1.8-1.9, stable 5. Hematuria: Enlarged Prostate With Bladder Stones- s/p TURP and cystolithalopaxy on 12/16/17, newly diagnosed moderate risk prostate Ca, also has a right renal lesion with features concerning for RCC (being followed by Dr. Lenis Dickinson with the [facility], repeat CT scheduled for September).  Per Urology : "CBI needs to continue at a slow drip leading up to and during his surgery, which has been moved to 8/27.  Hand irrigate Foley PRN suprapubic/abdominal discomfort, hematuria with clots and/or low output."  For questions or updates, please contact [facility] Please consult [email or web address] for contact info under Cardiology/STEMI.       Signed, Sanda Klein, MD  12/26/2017, 8:11 AM

## 2017-12-26 NOTE — Progress Notes (Signed)
4 Days Post-Op Subjective: Patient reports urine has been clear.  No complaints about the catheter.  He was telling me about his heart and valve surgery coming up.  Objective: Vital signs in last 24 hours: Temp:  [97.7 F (36.5 C)-98.5 F (36.9 C)] 98.2 F (36.8 C) (08/24 2000) Pulse Rate:  [52-79] 74 (08/24 2000) Resp:  [14-27] 22 (08/24 2000) BP: (91-151)/(42-109) 113/54 (08/24 2000) SpO2:  [95 %-100 %] 99 % (08/24 2000)  Intake/Output from previous day: 08/23 0701 - 08/24 0700 In: 10626 [P.O.:480; I.V.:175; Blood:625] Out: 94854 [Urine:36450] Intake/Output this shift: Total I/O In: 154 [P.O.:120; I.V.:34] Out: 2000 [Urine:2000]  Physical Exam:  No acute distress Alert and oriented Sitting in a chair watching TV Urine clear on a very slow CBI drip Abdomen soft and nontender, bladder not distended  Lab Results: Recent Labs    12/25/17 0613 12/25/17 1326 12/26/17 0302  HGB 9.3* 7.6* 9.7*  HCT 28.5* 23.3* 29.0*   BMET Recent Labs    12/25/17 0613 12/26/17 0302  NA 139 138  K 4.6 4.4  CL 109 108  CO2 20* 22  GLUCOSE 161* 99  BUN 26* 34*  CREATININE 2.16* 2.62*  CALCIUM 8.7* 8.3*   No results for input(s): LABPT, INR in the last 72 hours. No results for input(s): LABURIN in the last 72 hours. Results for orders placed or performed during the hospital encounter of 12/16/17  MRSA PCR Screening     Status: None   Collection Time: 12/18/17 11:33 AM  Result Value Ref Range Status   MRSA by PCR NEGATIVE NEGATIVE Final    Comment:        The GeneXpert MRSA Assay (FDA approved for NASAL specimens only), is one component of a comprehensive MRSA colonization surveillance program. It is not intended to diagnose MRSA infection nor to guide or monitor treatment for MRSA infections. Performed at [facility], [place] [address], [place],  [address]     Studies/Results: No results found.  Assessment/Plan: Assessment/Plan: 1 -  Enlarged Prostate With Bladder Stones, Hematuria- s/p TURP and cystolithalopaxy on 12/16/17. Urine remains clear on a slow CBI gtt. Hgb up today at 9.7 after transfusion.   2 - Moderate Risk Prostate Cancer - new DX, given his comorbidity, surveillance may be most reasonable options.   3 - Stage 3 Renal Insufficiency- appears to be medical renal disease. IVF per cardiology   4 - NSTEMI- greatly appreciate cardiology and CT surgery.Tentative plans for CABG and aortic valve replacement on 8/27  5. Right renal lesion with features concerning for RCC-The mass is being followed by Dr. Lenis Dickinson with the [facility]. He has a repeat CT scheduled for September.    LOS: 8 days   Festus Aloe 12/26/2017, 8:22 PM

## 2017-12-26 NOTE — Progress Notes (Signed)
ANTICOAGULATION CONSULT NOTE   Pharmacy Consult for heparin Indication: chest pain/ACS  No Known Allergies  Patient Measurements: Height: 5\' 8"  (172.7 cm) Weight: 185 lb 13.6 oz (84.3 kg) IBW/kg (Calculated) : 68.4 Heparin Dosing Weight: 87 kg  Vital Signs: Temp: 98.1 F (36.7 C) (08/24 1145) Temp Source: Oral (08/24 1145) BP: 100/52 (08/24 1315) Pulse Rate: 64 (08/24 1315)  Labs: Recent Labs    12/24/17 0220 12/25/17 0613 12/25/17 1326 12/26/17 0302 12/26/17 1132  HGB 9.4* 9.3* 7.6* 9.7*  --   HCT 28.9* 28.5* 23.3* 29.0*  --   PLT 213 264  --  194  --   HEPARINUNFRC 0.64  --   --   --  0.67  CREATININE 1.97* 2.16*  --  2.62*  --     Estimated Creatinine Clearance: 26.6 mL/min (A) (by C-G formula based on SCr of 2.62 mg/dL (H)).  Assessment: 72 y.o. male with hx PVD, CAD, HTN, and s/p TURP/cystolithalopexy on 12/16/17 with noted CP and elevated troponin on 12/19/17. Patient went for cardiac cath 8/20, pharmacy instructed to restart heparin 4 hours after sheath has been removed. Heparin was later  placed on hold 2/2 hematuria and 2gm hgb drop.    PM patient stable post PRBC and restarted heparin without a bolus.   Heparin drip 1750 uts/hr HL 0.67 at top of goal - bleeding has resolved, but will try to keep HL on lower end of range.  Goal of Therapy:  Heparin level 0.3-0.7 units/ml Monitor platelets by anticoagulation protocol: Yes   Plan:  Decrease heparin infusion to 1700 units/hr  Check anti-Xa level  daily while on heparin Continue to monitor H&H and platelets  Bonnita Nasuti Pharm.D. CPP, BCPS Clinical Pharmacist [phone] 12/26/2017 2:27 PM

## 2017-12-27 DIAGNOSIS — N289 Disorder of kidney and ureter, unspecified: Secondary | ICD-10-CM

## 2017-12-27 DIAGNOSIS — N189 Chronic kidney disease, unspecified: Secondary | ICD-10-CM

## 2017-12-27 LAB — BASIC METABOLIC PANEL
Anion gap: 10 (ref 5–15)
BUN: 34 mg/dL — ABNORMAL HIGH (ref 8–23)
CALCIUM: 8.3 mg/dL — AB (ref 8.9–10.3)
CO2: 21 mmol/L — AB (ref 22–32)
CREATININE: 2.69 mg/dL — AB (ref 0.61–1.24)
Chloride: 107 mmol/L (ref 98–111)
GFR calc Af Amer: 25 mL/min — ABNORMAL LOW (ref >60)
GFR calc non Af Amer: 22 mL/min — ABNORMAL LOW (ref >60)
GLUCOSE: 100 mg/dL — AB (ref 70–99)
Potassium: 4.2 mmol/L (ref 3.5–5.1)
Sodium: 138 mmol/L (ref 135–145)

## 2017-12-27 LAB — HEPARIN LEVEL (UNFRACTIONATED)
Heparin Unfractionated: 0.81 IU/mL — ABNORMAL HIGH (ref 0.30–0.70)
Heparin Unfractionated: 0.79 IU/mL — ABNORMAL HIGH (ref 0.30–0.70)

## 2017-12-27 LAB — CBC
HCT: 26 % — ABNORMAL LOW (ref 39.0–52.0)
Hemoglobin: 8.5 g/dL — ABNORMAL LOW (ref 13.0–17.0)
MCH: 29.9 pg (ref 26.0–34.0)
MCHC: 32.7 g/dL (ref 30.0–36.0)
MCV: 91.5 fL (ref 78.0–100.0)
PLATELETS: 200 10*3/uL (ref 150–400)
RBC: 2.84 MIL/uL — ABNORMAL LOW (ref 4.22–5.81)
RDW: 13.2 % (ref 11.5–15.5)
WBC: 11.4 10*3/uL — ABNORMAL HIGH (ref 4.0–10.5)

## 2017-12-27 NOTE — Progress Notes (Signed)
ANTICOAGULATION CONSULT NOTE   Pharmacy Consult for heparin Indication: chest pain/ACS  No Known Allergies  Patient Measurements: Height: 5\' 8"  (172.7 cm) Weight: 185 lb 13.6 oz (84.3 kg) IBW/kg (Calculated) : 68.4 Heparin Dosing Weight: 87 kg  Vital Signs: Temp: 98.2 F (36.8 C) (08/25 1136) Temp Source: Oral (08/25 1136) BP: 110/54 (08/25 1300) Pulse Rate: 63 (08/25 1300)  Labs: Recent Labs    12/25/17 0613 12/25/17 1326 12/26/17 0302 12/26/17 1132 12/27/17 0246 12/27/17 1201  HGB 9.3* 7.6* 9.7*  --  8.5*  --   HCT 28.5* 23.3* 29.0*  --  26.0*  --   PLT 264  --  194  --  200  --   HEPARINUNFRC  --   --   --  0.67 0.81* 0.79*  CREATININE 2.16*  --  2.62*  --  2.69*  --     Estimated Creatinine Clearance: 25.9 mL/min (A) (by C-G formula based on SCr of 2.69 mg/dL (H)).  Assessment: 72 y.o. male with hx PVD, CAD, HTN, and s/p TURP/cystolithalopexy on 12/16/17 with noted CP and elevated troponin on 12/19/17. Patient went for cardiac cath 8/20, pharmacy instructed to restart heparin 4 hours after sheath has been removed. Heparin was later  placed on hold 2/2 hematuria and 2gm hgb drop.    PM patient stable post PRBC and restarted heparin without a bolus.   Heparin drip 1550 uts/hr HL 0.8 > goal - bleeding has resolved,but h/h drifting dow.  Will aim to keep HL on lower end of range.  Goal of Therapy:  Heparin level 0.3-0.7 units/ml Monitor platelets by anticoagulation protocol: Yes   Plan:  Decrease heparin infusion to 1400 units/hr  Check anti-Xa level  daily while on heparin Continue to monitor H&H and platelets  Bonnita Nasuti Pharm.D. CPP, BCPS Clinical Pharmacist [phone] 12/27/2017 2:02 PM

## 2017-12-27 NOTE — Progress Notes (Signed)
[facility] for heparin Indication: chest pain/ACS  No Known Allergies  Patient Measurements: Height: 5\' 8"  (172.7 cm) Weight: 185 lb 13.6 oz (84.3 kg) IBW/kg (Calculated) : 68.4 Heparin Dosing Weight: 87 kg  Vital Signs: Temp: 98.1 F (36.7 C) (08/25 0000) Temp Source: Oral (08/25 0000) BP: 122/59 (08/25 0300) Pulse Rate: 63 (08/25 0300)  Labs: Recent Labs    12/25/17 0613 12/25/17 1326 12/26/17 0302 12/26/17 1132 12/27/17 0246  HGB 9.3* 7.6* 9.7*  --  8.5*  HCT 28.5* 23.3* 29.0*  --  26.0*  PLT 264  --  194  --  200  HEPARINUNFRC  --   --   --  0.67 0.81*  CREATININE 2.16*  --  2.62*  --   --     Estimated Creatinine Clearance: 26.6 mL/min (A) (by C-G formula based on SCr of 2.62 mg/dL (H)).  Assessment: 72 y.o. male with hx PVD, CAD, HTN, and s/p TURP/cystolithalopexy on 12/16/17 with noted CP and elevated troponin on 12/19/17. Patient went for cardiac cath 8/20, pharmacy instructed to restart heparin 4 hours after sheath has been removed. Heparin was later  placed on hold 2/2 hematuria and 2gm hgb drop.    PM patient stable post PRBC and restarted heparin without a bolus.   Heparin level 0.81 units/ml.  Hg dropped to 8.5.  Urine still pink per RN  Goal of Therapy:  Heparin level 0.3-0.7 units/ml Monitor platelets by anticoagulation protocol: Yes   Plan:  Decrease heparin infusion to 1550 units/hr  Check anti-Xa level  In 8 hours and daily while on heparin Continue to monitor H&H and platelets  Thanks for allowing pharmacy to be a part of this patient's care.  Excell Seltzer, PharmD Clinical Pharmacist

## 2017-12-27 NOTE — Progress Notes (Signed)
5 Days Post-Op Subjective: Patient reports no complaints. Per nurse urine was pink earlier and now clear.   Objective: Vital signs in last 24 hours: Temp:  [97.8 F (36.6 C)-98.2 F (36.8 C)] 98.2 F (36.8 C) (08/25 1136) Pulse Rate:  [50-76] 61 (08/25 1400) Resp:  [9-22] 15 (08/25 1400) BP: (89-139)/(49-85) 104/50 (08/25 1400) SpO2:  [96 %-99 %] 99 % (08/25 1400)  Intake/Output from previous day: 08/24 0701 - 08/25 0700 In: 4125.6 [P.O.:720; I.V.:405.6] Out: 8155 [Urine:8155] Intake/Output this shift: Total I/O In: 719 [P.O.:480; I.V.:239] Out: 3000 [Urine:3000]  Physical Exam:  NAD CBI on slow gtt - urine clear   Lab Results: Recent Labs    12/25/17 1326 12/26/17 0302 12/27/17 0246  HGB 7.6* 9.7* 8.5*  HCT 23.3* 29.0* 26.0*   BMET Recent Labs    12/26/17 0302 12/27/17 0246  NA 138 138  K 4.4 4.2  CL 108 107  CO2 22 21*  GLUCOSE 99 100*  BUN 34* 34*  CREATININE 2.62* 2.69*  CALCIUM 8.3* 8.3*   No results for input(s): LABPT, INR in the last 72 hours. No results for input(s): LABURIN in the last 72 hours. Results for orders placed or performed during the hospital encounter of 12/16/17  MRSA PCR Screening     Status: None   Collection Time: 12/18/17 11:33 AM  Result Value Ref Range Status   MRSA by PCR NEGATIVE NEGATIVE Final    Comment:        The GeneXpert MRSA Assay (FDA approved for NASAL specimens only), is one component of a comprehensive MRSA colonization surveillance program. It is not intended to diagnose MRSA infection nor to guide or monitor treatment for MRSA infections. Performed at [facility], [place] [address], [place], [place] [address]     Studies/Results: No results found.  Assessment/Plan: 1 - Enlarged Prostate With Bladder Stones, Hematuria- s/p TURP and cystolithalopaxyon 12/16/17. Urine remains clear on a slow CBI gtt. Hgb up to 9.7 after transfusion 8/24 but down to 8.5 today. Urine clear and no  significant GU bleeding.    2 - Moderate Risk Prostate Cancer - new DX, given his comorbidity, surveillance may be most reasonable options.   3 - Stage 3 Renal Insufficiency- appears to bemedical renal disease. IVF per cardiology. Cr 2.69 up slightly. Consider renal US if continues to rise.   4 - NSTEMI- greatly appreciate cardiology and CT surgery.Tentative plans for CABGand aortic valve replacementon 8/27  5. Right renal lesion with features concerning for RCC-The mass is being followed by Dr. Lenis Dickinson with the [facility]. He has a repeat CT scheduled for September.   LOS: 9 days   Matthew [name] 12/27/2017, 3:40 PM

## 2017-12-27 NOTE — Progress Notes (Signed)
Progress Note  Patient Name: Matthew [name] Date of Encounter: 12/27/2017  Primary Cardiologist: No primary care provider on file.   Subjective   Is angina or dyspnea.  Slept well last night.  Urine still looks like red. Hgb dropped 1 g since transfusion. Creat seems to have stopped increasing. Inpatient Medications    Scheduled Meds: . amLODipine  10 mg Oral q morning - 10a  . aspirin  81 mg Oral Daily  . atorvastatin  80 mg Oral QHS  . carvedilol  18.75 mg Oral BID WC  . doxazosin  2 mg Oral QHS  . gabapentin  600 mg Oral TID  . isosorbide mononitrate  30 mg Oral Daily  . latanoprost  1 drop Both Eyes QHS  . nicotine  14 mg Transdermal Daily  . oxybutynin  10 mg Oral Daily  . sodium chloride flush  3 mL Intravenous Q12H   Continuous Infusions: . sodium chloride Stopped (12/25/17 1649)  . heparin 1,550 Units/hr (12/27/17 0427)  . sodium chloride irrigation     PRN Meds: sodium chloride, acetaminophen, bismuth subsalicylate, diphenhydrAMINE **OR** diphenhydrAMINE, LORazepam, morphine injection, nitroGLYCERIN, ondansetron, opium-belladonna, oxybutynin, oxyCODONE-acetaminophen **AND** oxyCODONE, pentosan polysulfate, sodium chloride flush   Vital Signs    Vitals:   12/27/17 0300 12/27/17 0400 12/27/17 0500 12/27/17 0745  BP: (!) 122/59 128/62 128/63   Pulse: 63 (!) 58 (!) 54   Resp: 13 15 16    Temp:  97.8 F (36.6 C)  97.9 F (36.6 C)  TempSrc:  Oral  Oral  SpO2: 98% 98% 98%   Weight:      Height:        Intake/Output Summary (Last 24 hours) at 12/27/2017 0823 Last data filed at 12/27/2017 0600 Gross per 24 hour  Intake 3752.92 ml  Output 7125 ml  Net -3372.08 ml   Filed Weights   12/21/17 1558 12/21/17 1943 12/22/17 0338  Weight: 84.3 kg 84.3 kg 84.3 kg    Telemetry    Sinus rhythm- Personally Reviewed  ECG    No new tracing- Personally Reviewed  Physical Exam  Comfortable GEN: No acute distress.   Neck: No JVD, delayed carotid pulses and  bilateral bruits Cardiac: RRR,/6 mid peaking systolic ejection murmur in the aortic focus, no diastolic murmurs, rubs, or gallops.  Respiratory: Clear to auscultation bilaterally. GI: Soft, nontender, non-distended  MS: No edema; No deformity. Neuro:  Nonfocal  Psych: Normal affect   Labs    Chemistry Recent Labs  Lab 12/25/17 0613 12/26/17 0302 12/27/17 0246  NA 139 138 138  K 4.6 4.4 4.2  CL 109 108 107  CO2 20* 22 21*  GLUCOSE 161* 99 100*  BUN 26* 34* 34*  CREATININE 2.16* 2.62* 2.69*  CALCIUM 8.7* 8.3* 8.3*  GFRNONAA 29* 23* 22*  GFRAA 33* 26* 25*  ANIONGAP 10 8 10      Hematology Recent Labs  Lab 12/25/17 0613 12/25/17 1326 12/26/17 0302 12/27/17 0246  WBC 8.4  --  11.4* 11.4*  RBC 3.03*  --  3.19* 2.84*  HGB 9.3* 7.6* 9.7* 8.5*  HCT 28.5* 23.3* 29.0* 26.0*  MCV 94.1  --  90.9 91.5  MCH 30.7  --  30.4 29.9  MCHC 32.6  --  33.4 32.7  RDW 12.1  --  13.2 13.2  PLT 264  --  194 200    Cardiac EnzymesNo results for input(s): TROPONINI in the last 168 hours. No results for input(s): TROPIPOC in the last 168 hours.  BNPNo results for input(s): BNP, PROBNP in the last 168 hours.   DDimer No results for input(s): DDIMER in the last 168 hours.   Radiology    No results found.  Cardiac Studies   ECHO 12/18/2017 - Left ventricle: The cavity size was normal. Wall thickness was increased in a pattern of moderate LVH. Systolic function was normal. The estimated ejection fraction was in the range of 60% to 65%. Wall motion was normal; there were no regional wall motion abnormalities. Features are consistent with a pseudonormal left ventricular filling pattern, with concomitant abnormal relaxation and increased filling pressure (grade 2 diastolic dysfunction). - Aortic valve: Trileaflet; severely calcified leaflets. There was severe stenosis. There was trivial regurgitation. Mean gradient (S): 49 mm Hg. Valve area (VTI): 0.6 cm^2. - Mitral  valve: Mildly calcified annulus. There was mild regurgitation. - Right ventricle: The cavity size was normal. Systolic function was normal. - Pulmonary arteries: No complete TR doppler jet so unable to estimate PA systolic pressure. - Systemic veins: IVC measured 2.0 cm with < 50% respirophasic variation, suggesting RA pressure 8 mmHg.  Impressions:  - Normal LV size with moderate LV hypertrophy. EF 60-65%. Normal RV size and systolic function. Mild mitral regurgitation. Severe aortic stenosis.  CATH 12/22/2017  Ost LM lesion is 95% stenosed.  Prox Cx to Mid Cx lesion is 60% stenosed.  Ost LAD lesion is 95% stenosed.  Prox LAD to Mid LAD lesion is 75% stenosed.  Mid RCA to Dist RCA lesion is 100% stenosed.   Patient Profile     72 y.o. male with severe CAD and severe AS, s/p small NSTEMI soon after urological surgery, developing hematuria about 7 days postop while on IV heparin. Scheduled for CABG/AVR on 08/27.  Assessment & Plan    1. CAD s/p NSTEMI; 95% ostial LCA, 95% LAD, occluded RCA. High risk for complications. On IV heparin until surgery. On statin, ASA.  Asymptomatic at rest 2. AS (severe): plan AVR at time of CABG. Preserved LVEF 3. 80-99% R ICA stenosis: asymptomatic 4. CKD 3: baseline creat seems to be 1.8-1.9.  Transient increase may have been due to contrast nephrotoxicity or urological issues.  Around 2.6 over the last 2 days, likely show improvement starting tomorrow 5. Hematuria: Enlarged Prostate With Bladder Stones- s/p TURP and cystolithalopaxyon 12/16/17, newly diagnosed moderate risk prostate Ca, also has a right renal lesion with features concerning for RCC (being followed by Dr. Lenis Dickinson with the [facility], repeat CT scheduled for September). Per Urology : "CBI needs to continue at a slow drip leading up to and during his surgery, which has been moved to 8/27. Hand irrigate Foley PRN suprapubic/abdominal discomfort, hematuria with clots  and/or low output."   For questions or updates, please contact [facility] Please consult [email or web address] for contact info under Cardiology/STEMI.      Signed, Sanda Klein, MD  12/27/2017, 8:23 AM

## 2017-12-27 NOTE — Progress Notes (Signed)
1530 - new CBI bags spiked and hung.

## 2017-12-28 LAB — BASIC METABOLIC PANEL
ANION GAP: 8 (ref 5–15)
BUN: 30 mg/dL — ABNORMAL HIGH (ref 8–23)
CHLORIDE: 105 mmol/L (ref 98–111)
CO2: 24 mmol/L (ref 22–32)
Calcium: 8.4 mg/dL — ABNORMAL LOW (ref 8.9–10.3)
Creatinine, Ser: 2.45 mg/dL — ABNORMAL HIGH (ref 0.61–1.24)
GFR calc Af Amer: 28 mL/min — ABNORMAL LOW (ref >60)
GFR calc non Af Amer: 25 mL/min — ABNORMAL LOW (ref >60)
GLUCOSE: 82 mg/dL (ref 70–99)
POTASSIUM: 4.4 mmol/L (ref 3.5–5.1)
Sodium: 137 mmol/L (ref 135–145)

## 2017-12-28 LAB — CBC
HCT: 25.9 % — ABNORMAL LOW (ref 39.0–52.0)
HEMOGLOBIN: 8.3 g/dL — AB (ref 13.0–17.0)
MCH: 30.2 pg (ref 26.0–34.0)
MCHC: 32 g/dL (ref 30.0–36.0)
MCV: 94.2 fL (ref 78.0–100.0)
Platelets: 221 10*3/uL (ref 150–400)
RBC: 2.75 MIL/uL — ABNORMAL LOW (ref 4.22–5.81)
RDW: 13.2 % (ref 11.5–15.5)
WBC: 10.8 10*3/uL — ABNORMAL HIGH (ref 4.0–10.5)

## 2017-12-28 LAB — HEPARIN LEVEL (UNFRACTIONATED): Heparin Unfractionated: 0.36 IU/mL (ref 0.30–0.70)

## 2017-12-28 LAB — PREPARE RBC (CROSSMATCH)

## 2017-12-28 MED ORDER — DOPAMINE-DEXTROSE 3.2-5 MG/ML-% IV SOLN
0.0000 ug/kg/min | INTRAVENOUS | Status: DC
  Filled 2017-12-28: qty 250

## 2017-12-28 MED ORDER — TRANEXAMIC ACID 1000 MG/10ML IV SOLN
1.5000 mg/kg/h | INTRAVENOUS | Status: AC
  Administered 2017-12-29: 1.5 mg/kg/h via INTRAVENOUS
  Filled 2017-12-28: qty 25

## 2017-12-28 MED ORDER — TEMAZEPAM 15 MG PO CAPS: 15.0000 mg | ORAL_CAPSULE | Freq: Once | ORAL | Status: DC | PRN

## 2017-12-28 MED ORDER — CHLORHEXIDINE GLUCONATE CLOTH 2 % EX PADS
6.0000 | MEDICATED_PAD | Freq: Once | CUTANEOUS | Status: AC
  Administered 2017-12-29: 6 via TOPICAL

## 2017-12-28 MED ORDER — EPINEPHRINE PF 1 MG/ML IJ SOLN
0.0000 ug/min | INTRAVENOUS | Status: DC
  Filled 2017-12-28: qty 4

## 2017-12-28 MED ORDER — SODIUM CHLORIDE 0.9 % IV SOLN
1.5000 g | INTRAVENOUS | Status: AC
  Administered 2017-12-29: .75 g via INTRAVENOUS
  Administered 2017-12-29: 1.5 g via INTRAVENOUS
  Filled 2017-12-28: qty 1.5

## 2017-12-28 MED ORDER — PLASMA-LYTE 148 IV SOLN
INTRAVENOUS | Status: AC
  Administered 2017-12-29: 500 mL
  Filled 2017-12-28: qty 2.5

## 2017-12-28 MED ORDER — CHLORHEXIDINE GLUCONATE CLOTH 2 % EX PADS
6.0000 | MEDICATED_PAD | Freq: Once | CUTANEOUS | Status: AC
  Administered 2017-12-28: 6 via TOPICAL

## 2017-12-28 MED ORDER — VANCOMYCIN HCL 10 G IV SOLR
1500.0000 mg | INTRAVENOUS | Status: AC
  Administered 2017-12-29: 1500 mg via INTRAVENOUS
  Filled 2017-12-28: qty 1000

## 2017-12-28 MED ORDER — TRANEXAMIC ACID (OHS) BOLUS VIA INFUSION
15.0000 mg/kg | INTRAVENOUS | Status: AC
  Administered 2017-12-29: 1264.5 mg via INTRAVENOUS
  Filled 2017-12-28: qty 1265

## 2017-12-28 MED ORDER — BISACODYL 5 MG PO TBEC
5.0000 mg | DELAYED_RELEASE_TABLET | Freq: Once | ORAL | Status: AC
  Administered 2017-12-28: 5 mg via ORAL
  Filled 2017-12-28: qty 1

## 2017-12-28 MED ORDER — NITROGLYCERIN IN D5W 200-5 MCG/ML-% IV SOLN
2.0000 ug/min | INTRAVENOUS | Status: AC
  Administered 2017-12-29: 50 ug/min via INTRAVENOUS
  Filled 2017-12-28: qty 250

## 2017-12-28 MED ORDER — POTASSIUM CHLORIDE 2 MEQ/ML IV SOLN
80.0000 meq | INTRAVENOUS | Status: DC
  Filled 2017-12-28: qty 40

## 2017-12-28 MED ORDER — TRANEXAMIC ACID (OHS) PUMP PRIME SOLUTION
2.0000 mg/kg | INTRAVENOUS | Status: DC
  Filled 2017-12-28: qty 1.69

## 2017-12-28 MED ORDER — SODIUM CHLORIDE 0.9 % IV SOLN
INTRAVENOUS | Status: DC
  Filled 2017-12-28: qty 30

## 2017-12-28 MED ORDER — DEXMEDETOMIDINE HCL IN NACL 400 MCG/100ML IV SOLN
0.1000 ug/kg/h | INTRAVENOUS | Status: DC
  Filled 2017-12-28: qty 100

## 2017-12-28 MED ORDER — MAGNESIUM SULFATE 50 % IJ SOLN
40.0000 meq | INTRAMUSCULAR | Status: DC
  Filled 2017-12-28: qty 9.85

## 2017-12-28 MED ORDER — METOPROLOL TARTRATE 12.5 MG HALF TABLET
12.5000 mg | ORAL_TABLET | Freq: Once | ORAL | Status: DC
  Filled 2017-12-28: qty 1

## 2017-12-28 MED ORDER — SODIUM CHLORIDE 0.9 % IV SOLN
30.0000 ug/min | INTRAVENOUS | Status: DC
  Filled 2017-12-28: qty 2

## 2017-12-28 MED ORDER — CHLORHEXIDINE GLUCONATE 0.12 % MT SOLN
15.0000 mL | Freq: Once | OROMUCOSAL | Status: AC
  Administered 2017-12-29: 15 mL via OROMUCOSAL
  Filled 2017-12-28: qty 15

## 2017-12-28 MED ORDER — SODIUM CHLORIDE 0.9 % IV SOLN
INTRAVENOUS | Status: DC
  Filled 2017-12-28: qty 1

## 2017-12-28 MED ORDER — SODIUM CHLORIDE 0.9 % IV SOLN
750.0000 mg | INTRAVENOUS | Status: DC
  Filled 2017-12-28: qty 750

## 2017-12-28 MED ORDER — MILRINONE LACTATE IN DEXTROSE 20-5 MG/100ML-% IV SOLN
0.1250 ug/kg/min | INTRAVENOUS | Status: DC
  Filled 2017-12-28: qty 100

## 2017-12-28 MED ORDER — DIAZEPAM 5 MG PO TABS
5.0000 mg | ORAL_TABLET | Freq: Once | ORAL | Status: AC
  Administered 2017-12-29: 5 mg via ORAL
  Filled 2017-12-28: qty 1

## 2017-12-28 NOTE — Progress Notes (Signed)
ANTICOAGULATION CONSULT NOTE   Pharmacy Consult for heparin Indication: chest pain/ACS  No Known Allergies  Patient Measurements: Height: 5\' 8"  (172.7 cm) Weight: 185 lb 13.6 oz (84.3 kg) IBW/kg (Calculated) : 68.4 Heparin Dosing Weight: 87 kg  Vital Signs: Temp: 97.8 F (36.6 C) (08/26 0728) Temp Source: Oral (08/26 0728) BP: 129/58 (08/26 0800) Pulse Rate: 64 (08/26 0800)  Labs: Recent Labs    12/26/17 0302  12/27/17 0246 12/27/17 1201 12/28/17 0300  HGB 9.7*  --  8.5*  --  8.3*  HCT 29.0*  --  26.0*  --  25.9*  PLT 194  --  200  --  221  HEPARINUNFRC  --    < > 0.81* 0.79* 0.36  CREATININE 2.62*  --  2.69*  --  2.45*   < > = values in this interval not displayed.    Estimated Creatinine Clearance: 28.4 mL/min (A) (by C-G formula based on SCr of 2.45 mg/dL (H)).  Assessment: 72 y.o. male with hx PVD, CAD, HTN, and s/p TURP/cystolithalopexy on 12/16/17 with noted CP and elevated troponin on 12/19/17. Patient went for cardiac cath 8/20, pharmacy instructed to restart heparin 4 hours after sheath has been removed. Heparin was later placed on hold 2/2 hematuria and 2gm hgb drop on 8/23.    PM patient stable post PRBC and restarted heparin without a bolus.   Heparin level therapeutic at 0.36 on 1400 units/hr. H/H low, but stable. No signs/symptoms of bleeding or issues with the infusion reported by nursing. Will aim to keep HL on lower end of range.  Goal of Therapy:  Heparin level 0.3-0.7 units/ml Monitor platelets by anticoagulation protocol: Yes   Plan:  Continue heparin infusion at 1400 units/hr  Check anti-Xa level  daily while on heparin Continue to monitor H&H and platelets  Claiborne Billings, PharmD PGY2 Cardiology Pharmacy Resident Phone [phone] 12/28/2017 8:28 AM

## 2017-12-28 NOTE — Progress Notes (Signed)
Progress Note  Patient Name: Matthew [name] Date of Encounter: 12/28/2017  Primary Cardiologist: Stanford Breed may f/u in [place] for surgery no chest pain   Inpatient Medications    Scheduled Meds: . amLODipine  10 mg Oral q morning - 10a  . aspirin  81 mg Oral Daily  . atorvastatin  80 mg Oral QHS  . carvedilol  18.75 mg Oral BID WC  . doxazosin  2 mg Oral QHS  . gabapentin  600 mg Oral TID  . isosorbide mononitrate  30 mg Oral Daily  . latanoprost  1 drop Both Eyes QHS  . nicotine  14 mg Transdermal Daily  . oxybutynin  10 mg Oral Daily  . sodium chloride flush  3 mL Intravenous Q12H   Continuous Infusions: . sodium chloride Stopped (12/25/17 1649)  . heparin 1,400 Units/hr (12/28/17 0700)  . sodium chloride irrigation     PRN Meds: sodium chloride, acetaminophen, bismuth subsalicylate, diphenhydrAMINE **OR** diphenhydrAMINE, LORazepam, morphine injection, nitroGLYCERIN, ondansetron, opium-belladonna, oxybutynin, oxyCODONE-acetaminophen **AND** oxyCODONE, pentosan polysulfate, sodium chloride flush   Vital Signs    Vitals:   12/28/17 0400 12/28/17 0500 12/28/17 0600 12/28/17 0728  BP: (!) 114/57 121/66 129/69   Pulse: (!) 51 (!) 52 (!) 49   Resp: 14 15 14    Temp: 98 F (36.7 C)   97.8 F (36.6 C)  TempSrc: Oral   Oral  SpO2: 97% 96% 95%   Weight:      Height:        Intake/Output Summary (Last 24 hours) at 12/28/2017 0755 Last data filed at 12/28/2017 0700 Gross per 24 hour  Intake 6956.12 ml  Output 9325 ml  Net -2368.88 ml   Filed Weights   12/21/17 1558 12/21/17 1943 12/22/17 0338  Weight: 84.3 kg 84.3 kg 84.3 kg    Telemetry    Sinus rhythm 12/28/2017 - Personally Reviewed  ECG    SR LVH inferolateral T wave inversions   Physical Exam  BP 129/69   Pulse (!) 49   Temp 97.8 F (36.6 C) (Oral)   Resp 14   Ht 5\' 8"  (1.727 m)   Wt 84.3 kg   SpO2 95%   BMI 28.26 kg/m  Affect appropriate Elderly black male  HEENT:  normal Neck supple with no adenopathy JVP normal no bruits no thyromegaly Lungs clear with no wheezing and good diaphragmatic motion Heart:  S1/S2 severe AS murmur, no rub, gallop or click PMI normal Abdomen: benighn, BS positve, no tenderness, no AAA no bruit.  No HSM or HJR Distal pulses intact with no bruits No edema Neuro non-focal Skin warm and dry No muscular weakness   Labs    Chemistry Recent Labs  Lab 12/26/17 0302 12/27/17 0246 12/28/17 0300  NA 138 138 137  K 4.4 4.2 4.4  CL 108 107 105  CO2 22 21* 24  GLUCOSE 99 100* 82  BUN 34* 34* 30*  CREATININE 2.62* 2.69* 2.45*  CALCIUM 8.3* 8.3* 8.4*  GFRNONAA 23* 22* 25*  GFRAA 26* 25* 28*  ANIONGAP 8 10 8      Hematology Recent Labs  Lab 12/26/17 0302 12/27/17 0246 12/28/17 0300  WBC 11.4* 11.4* 10.8*  RBC 3.19* 2.84* 2.75*  HGB 9.7* 8.5* 8.3*  HCT 29.0* 26.0* 25.9*  MCV 90.9 91.5 94.2  MCH 30.4 29.9 30.2  MCHC 33.4 32.7 32.0  RDW 13.2 13.2 13.2  PLT 194 200 221    Cardiac EnzymesNo results for input(s): TROPONINI  in the last 168 hours. No results for input(s): TROPIPOC in the last 168 hours.   BNPNo results for input(s): BNP, PROBNP in the last 168 hours.   DDimer No results for input(s): DDIMER in the last 168 hours.   Radiology    No results found.  Cardiac Studies   ECHO 12/18/2017 - Left ventricle: The cavity size was normal. Wall thickness was increased in a pattern of moderate LVH. Systolic function was normal. The estimated ejection fraction was in the range of 60% to 65%. Wall motion was normal; there were no regional wall motion abnormalities. Features are consistent with a pseudonormal left ventricular filling pattern, with concomitant abnormal relaxation and increased filling pressure (grade 2 diastolic dysfunction). - Aortic valve: Trileaflet; severely calcified leaflets. There was severe stenosis. There was trivial regurgitation. Mean gradient (S): 49 mm Hg.  Valve area (VTI): 0.6 cm^2. - Mitral valve: Mildly calcified annulus. There was mild regurgitation. - Right ventricle: The cavity size was normal. Systolic function was normal. - Pulmonary arteries: No complete TR doppler jet so unable to estimate PA systolic pressure. - Systemic veins: IVC measured 2.0 cm with < 50% respirophasic variation, suggesting RA pressure 8 mmHg.  Impressions:  - Normal LV size with moderate LV hypertrophy. EF 60-65%. Normal RV size and systolic function. Mild mitral regurgitation. Severe aortic stenosis.  CATH 12/22/2017  Ost LM lesion is 95% stenosed.  Prox Cx to Mid Cx lesion is 60% stenosed.  Ost LAD lesion is 95% stenosed.  Prox LAD to Mid LAD lesion is 75% stenosed.  Mid RCA to Dist RCA lesion is 100% stenosed.   Patient Profile     72 y.o. male with severe CAD and severe AS, s/p small NSTEMI soon after urological surgery, developing hematuria about 7 days postop while on IV heparin. Scheduled for CABG/AVR on 08/27.  Assessment & Plan    1. CAD s/p NSTEMI; 95% ostial LCA, 95% LAD, occluded RCA. High risk for complications. On IV heparin until surgery. On statin, ASA.  Asymptomatic at rest 2. AS (severe): plan AVR at time of CABG. Preserved LVEF 3. 80-99% R ICA stenosis: asymptomatic Seen by Dr Trula Slade no plans for CEA before CABG  4. CKD 3: slight bump in Cr since dye/cath but seems stable with good urine output  5. Hematuria: Enlarged Prostate With Bladder Stones- s/p TURP and cystolithalopaxyon 12/16/17, newly diagnosed moderate risk prostate Ca, also has a right renal lesion with features concerning for RCC (being followed by Dr. Lenis Dickinson with the [facility], repeat CT scheduled for September). Per Urology : "CBI needs to continue at a slow drip leading up to and during his surgery, which has been moved to 8/27. Hand irrigate Foley PRN suprapubic/abdominal discomfort, hematuria with clots and/or low output."   For questions  or updates, please contact [facility] Please consult [email or web address] for contact info under Cardiology/STEMI.      Signed, Jenkins Rouge, MD  12/28/2017, 7:55 AM

## 2017-12-28 NOTE — Care Management (Signed)
Call received from [facility], with an update provided. Patient followed by [facility] for PCP services with Dr. Rondell Reams; Clinical SW: Ralene Bathe @ [phone] ext [phone]; pager [phone].   Midge Minium RN, BSN, NCM-BC, ACM-RN [phone]

## 2017-12-28 NOTE — Progress Notes (Signed)
6 Days Post-Op Subjective: No complaints this evening  Objective: Vital signs in last 24 hours: Temp:  [97.8 F (36.6 C)-99.1 F (37.3 C)] 99.1 F (37.3 C) (08/26 2058) Pulse Rate:  [49-81] 81 (08/26 2000) Resp:  [13-19] 16 (08/26 2000) BP: (95-158)/(48-94) 158/75 (08/26 2000) SpO2:  [94 %-99 %] 97 % (08/26 2000)  Intake/Output from previous day: 08/25 0701 - 08/26 0700 In: 6956.1 [P.O.:480; I.V.:476.1] Out: 9325 [Urine:9325]  Intake/Output this shift: Total I/O In: 14 [I.V.:14] Out: -   Physical Exam:  General: Alert and oriented Gu: Foley draining thin, maroon colored urine with no clots.  On slow CBI drip   Lab Results: Recent Labs    12/26/17 0302 12/27/17 0246 12/28/17 0300  HGB 9.7* 8.5* 8.3*  HCT 29.0* 26.0* 25.9*   BMET Recent Labs    12/27/17 0246 12/28/17 0300  NA 138 137  K 4.2 4.4  CL 107 105  CO2 21* 24  GLUCOSE 100* 82  BUN 34* 30*  CREATININE 2.69* 2.45*  CALCIUM 8.3* 8.4*     Studies/Results: No results found.  Assessment/Plan: 1 - Enlarged Prostate With Bladder Stones, Hematuria- s/p TURP and cystolithalopaxyon 12/16/17. Urine slightly blood tinged on a slow CBI gtt. Hgb slowly drifting downward. Recommend continuing CBI during surgery tomorrow.  Will continue to monitor.   2 - Moderate Risk Prostate Cancer - new DX, given his comorbidity, surveillance may be most reasonable options.   3 - Stage 3 Renal Insufficiency- appears to bemedical renal disease. IVF per cardiology   4 - NSTEMI- greatly appreciate cardiology and CT surgery.Tentative plans for CABGand aortic valve replacementon 8/27  5. Right renal lesion with features concerning for RCC-The mass is being followed by Dr. Lenis Dickinson with the [facility]. He has a repeat CT scheduled for September.   LOS: 10 days   [name], MD [facility] Pager: [phone]  12/28/2017, 9:16 PM

## 2017-12-28 NOTE — Plan of Care (Signed)
Pt currently in bed, eating dinner. Family at bedside. Vital signs stable. CBI going, PRN meds given as ordered. No complaint of pain at this time. Pt sat in chair for most part of shift. Pt also ambulated this shift. No new changes at this time. Will continue to monitor.  Problem: Education: Goal: Knowledge of General Education information will improve Description Including pain rating scale, medication(s)/side effects and non-pharmacologic comfort measures Outcome: Progressing   Problem: Education: Goal: Understanding of cardiac disease, CV risk reduction, and recovery process will improve Outcome: Progressing Goal: Understanding of medication regimen will improve Outcome: Progressing Goal: Individualized Educational Video(s) Outcome: Progressing   Problem: Activity: Goal: Ability to tolerate increased activity will improve Outcome: Progressing   Problem: Cardiac: Goal: Ability to achieve and maintain adequate cardiopulmonary perfusion will improve Outcome: Progressing Goal: Vascular access site(s) Level 0-1 will be maintained Outcome: Progressing   Problem: Health Behavior/Discharge Planning: Goal: Ability to safely manage health-related needs after discharge will improve Outcome: Progressing

## 2017-12-28 NOTE — Progress Notes (Signed)
6 Days Post-Op Procedure(s) (LRB): RIGHT/LEFT HEART CATH AND CORONARY ANGIOGRAPHY (N/A) Subjective: No chest pain or shortness of breath. Ambulated around ICU  Objective: Vital signs in last 24 hours: Temp:  [97.8 F (36.6 C)-98.5 F (36.9 C)] 98.5 F (36.9 C) (08/26 1553) Pulse Rate:  [49-73] 68 (08/26 1900) Cardiac Rhythm: Sinus bradycardia (08/26 1600) Resp:  [12-19] 18 (08/26 1900) BP: (95-137)/(48-94) 135/59 (08/26 1900) SpO2:  [94 %-99 %] 97 % (08/26 1900)  Hemodynamic parameters for last 24 hours:    Intake/Output from previous day: 08/25 0701 - 08/26 0700 In: 6956.1 [P.O.:480; I.V.:476.1] Out: 9325 [Urine:9325] Intake/Output this shift: No intake/output data recorded.  General appearance: alert and cooperative Heart: regular rate and rhythm, systolic murmur Lungs: clear to auscultation bilaterally  Lab Results: Recent Labs    12/27/17 0246 12/28/17 0300  WBC 11.4* 10.8*  HGB 8.5* 8.3*  HCT 26.0* 25.9*  PLT 200 221   BMET:  Recent Labs    12/27/17 0246 12/28/17 0300  NA 138 137  K 4.2 4.4  CL 107 105  CO2 21* 24  GLUCOSE 100* 82  BUN 34* 30*  CREATININE 2.69* 2.45*  CALCIUM 8.3* 8.4*    PT/INR: No results for input(s): LABPROT, INR in the last 72 hours. ABG    Component Value Date/Time   PHART 7.408 12/22/2017 1043   HCO3 21.8 12/22/2017 1043   TCO2 23 12/22/2017 1043   ACIDBASEDEF 2.0 12/22/2017 1043   O2SAT 94.0 12/22/2017 1043   CBG (last 3)  No results for input(s): GLUCAP in the last 72 hours.  Assessment/Plan: Severe left main and 3-vessel CAD with severe aortic stenosis. Plan CABG and AVR in the AM. Patient and family have no further questions.  LOS: 10 days    [name] 12/28/2017

## 2017-12-29 ENCOUNTER — Inpatient Hospital Stay (HOSPITAL_COMMUNITY)
Admission: RE | Disposition: A | Discharge status: Home / Self Care | Payer: Self-pay | Source: Ambulatory Visit | Attending: Urology

## 2017-12-29 ENCOUNTER — Encounter (HOSPITAL_COMMUNITY): Payer: Self-pay | Admitting: Certified Registered Nurse Anesthetist

## 2017-12-29 ENCOUNTER — Inpatient Hospital Stay (HOSPITAL_COMMUNITY): Payer: No Typology Code available for payment source

## 2017-12-29 ENCOUNTER — Inpatient Hospital Stay (HOSPITAL_COMMUNITY): Payer: No Typology Code available for payment source | Admitting: Certified Registered Nurse Anesthetist

## 2017-12-29 DIAGNOSIS — Z951 Presence of aortocoronary bypass graft: Secondary | ICD-10-CM

## 2017-12-29 HISTORY — PX: TEE WITHOUT CARDIOVERSION: SHX5443

## 2017-12-29 HISTORY — PX: CORONARY ARTERY BYPASS GRAFT: SHX141

## 2017-12-29 HISTORY — PX: AORTIC VALVE REPLACEMENT: SHX41

## 2017-12-29 LAB — POCT I-STAT 3, ART BLOOD GAS (G3+)
ACID-BASE DEFICIT: 3 mmol/L — AB (ref 0.0–2.0)
ACID-BASE DEFICIT: 6 mmol/L — AB (ref 0.0–2.0)
ACID-BASE DEFICIT: 2 mmol/L (ref 0.0–2.0)
ACID-BASE DEFICIT: 4 mmol/L — AB (ref 0.0–2.0)
Acid-base deficit: 5 mmol/L — ABNORMAL HIGH (ref 0.0–2.0)
Acid-base deficit: 11 mmol/L — ABNORMAL HIGH (ref 0.0–2.0)
BICARBONATE: 22.7 mmol/L (ref 20.0–28.0)
BICARBONATE: 20.2 mmol/L (ref 20.0–28.0)
BICARBONATE: 19.9 mmol/L — AB (ref 20.0–28.0)
Bicarbonate: 15.1 mmol/L — ABNORMAL LOW (ref 20.0–28.0)
Bicarbonate: 22.9 mmol/L (ref 20.0–28.0)
Bicarbonate: 21.2 mmol/L (ref 20.0–28.0)
O2 SAT: 96 %
O2 SAT: 98 %
O2 Saturation: 100 %
O2 Saturation: 100 %
O2 Saturation: 96 %
O2 Saturation: 97 %
PCO2 ART: 38.5 mmHg (ref 32.0–48.0)
PH ART: 7.357 (ref 7.350–7.450)
PO2 ART: 455 mmHg — AB (ref 83.0–108.0)
PO2 ART: 86 mmHg (ref 83.0–108.0)
Patient temperature: 35.8
Patient temperature: 35.9
Patient temperature: 36.3
TCO2: 24 mmol/L (ref 22–32)
TCO2: 21 mmol/L — ABNORMAL LOW (ref 22–32)
TCO2: 16 mmol/L — AB (ref 22–32)
TCO2: 21 mmol/L — AB (ref 22–32)
TCO2: 24 mmol/L (ref 22–32)
TCO2: 22 mmol/L (ref 22–32)
pCO2 arterial: 42.9 mmHg (ref 32.0–48.0)
pCO2 arterial: 29.7 mmHg — ABNORMAL LOW (ref 32.0–48.0)
pCO2 arterial: 37.9 mmHg (ref 32.0–48.0)
pCO2 arterial: 38.4 mmHg (ref 32.0–48.0)
pCO2 arterial: 37.4 mmHg (ref 32.0–48.0)
pH, Arterial: 7.331 — ABNORMAL LOW (ref 7.350–7.450)
pH, Arterial: 7.328 — ABNORMAL LOW (ref 7.350–7.450)
pH, Arterial: 7.307 — ABNORMAL LOW (ref 7.350–7.450)
pH, Arterial: 7.323 — ABNORMAL LOW (ref 7.350–7.450)
pH, Arterial: 7.38 (ref 7.350–7.450)
pO2, Arterial: 413 mmHg — ABNORMAL HIGH (ref 83.0–108.0)
pO2, Arterial: 88 mmHg (ref 83.0–108.0)
pO2, Arterial: 84 mmHg (ref 83.0–108.0)
pO2, Arterial: 112 mmHg — ABNORMAL HIGH (ref 83.0–108.0)

## 2017-12-29 LAB — POCT I-STAT, CHEM 8
BUN: 28 mg/dL — ABNORMAL HIGH (ref 8–23)
BUN: 25 mg/dL — ABNORMAL HIGH (ref 8–23)
BUN: 25 mg/dL — ABNORMAL HIGH (ref 8–23)
BUN: 27 mg/dL — ABNORMAL HIGH (ref 8–23)
BUN: 28 mg/dL — AB (ref 8–23)
BUN: 27 mg/dL — ABNORMAL HIGH (ref 8–23)
BUN: 25 mg/dL — AB (ref 8–23)
CALCIUM ION: 1.26 mmol/L (ref 1.15–1.40)
CALCIUM ION: 1.25 mmol/L (ref 1.15–1.40)
CALCIUM ION: 1.16 mmol/L (ref 1.15–1.40)
CHLORIDE: 106 mmol/L (ref 98–111)
CHLORIDE: 106 mmol/L (ref 98–111)
CHLORIDE: 107 mmol/L (ref 98–111)
CREATININE: 2.1 mg/dL — AB (ref 0.61–1.24)
CREATININE: 2 mg/dL — AB (ref 0.61–1.24)
CREATININE: 2 mg/dL — AB (ref 0.61–1.24)
CREATININE: 1.7 mg/dL — AB (ref 0.61–1.24)
Calcium, Ion: 0.99 mmol/L — ABNORMAL LOW (ref 1.15–1.40)
Calcium, Ion: 1.16 mmol/L (ref 1.15–1.40)
Calcium, Ion: 1.36 mmol/L (ref 1.15–1.40)
Calcium, Ion: 1.26 mmol/L (ref 1.15–1.40)
Chloride: 107 mmol/L (ref 98–111)
Chloride: 105 mmol/L (ref 98–111)
Chloride: 107 mmol/L (ref 98–111)
Chloride: 108 mmol/L (ref 98–111)
Creatinine, Ser: 1.7 mg/dL — ABNORMAL HIGH (ref 0.61–1.24)
Creatinine, Ser: 2 mg/dL — ABNORMAL HIGH (ref 0.61–1.24)
Creatinine, Ser: 2 mg/dL — ABNORMAL HIGH (ref 0.61–1.24)
GLUCOSE: 156 mg/dL — AB (ref 70–99)
Glucose, Bld: 98 mg/dL (ref 70–99)
Glucose, Bld: 101 mg/dL — ABNORMAL HIGH (ref 70–99)
Glucose, Bld: 118 mg/dL — ABNORMAL HIGH (ref 70–99)
Glucose, Bld: 197 mg/dL — ABNORMAL HIGH (ref 70–99)
Glucose, Bld: 175 mg/dL — ABNORMAL HIGH (ref 70–99)
Glucose, Bld: 141 mg/dL — ABNORMAL HIGH (ref 70–99)
HCT: 20 % — ABNORMAL LOW (ref 39.0–52.0)
HCT: 21 % — ABNORMAL LOW (ref 39.0–52.0)
HEMATOCRIT: 25 % — AB (ref 39.0–52.0)
HEMATOCRIT: 22 % — AB (ref 39.0–52.0)
HEMATOCRIT: 24 % — AB (ref 39.0–52.0)
HEMATOCRIT: 25 % — AB (ref 39.0–52.0)
HEMATOCRIT: 22 % — AB (ref 39.0–52.0)
HEMOGLOBIN: 8.5 g/dL — AB (ref 13.0–17.0)
HEMOGLOBIN: 6.8 g/dL — AB (ref 13.0–17.0)
HEMOGLOBIN: 7.5 g/dL — AB (ref 13.0–17.0)
HEMOGLOBIN: 7.1 g/dL — AB (ref 13.0–17.0)
Hemoglobin: 8.2 g/dL — ABNORMAL LOW (ref 13.0–17.0)
Hemoglobin: 8.5 g/dL — ABNORMAL LOW (ref 13.0–17.0)
Hemoglobin: 7.5 g/dL — ABNORMAL LOW (ref 13.0–17.0)
POTASSIUM: 5.5 mmol/L — AB (ref 3.5–5.1)
POTASSIUM: 6.5 mmol/L — AB (ref 3.5–5.1)
Potassium: 4.4 mmol/L (ref 3.5–5.1)
Potassium: 4.8 mmol/L (ref 3.5–5.1)
Potassium: 5.6 mmol/L — ABNORMAL HIGH (ref 3.5–5.1)
Potassium: 6.4 mmol/L (ref 3.5–5.1)
Potassium: 5.2 mmol/L — ABNORMAL HIGH (ref 3.5–5.1)
SODIUM: 137 mmol/L (ref 135–145)
SODIUM: 136 mmol/L (ref 135–145)
SODIUM: 134 mmol/L — AB (ref 135–145)
SODIUM: 135 mmol/L (ref 135–145)
SODIUM: 137 mmol/L (ref 135–145)
Sodium: 137 mmol/L (ref 135–145)
Sodium: 138 mmol/L (ref 135–145)
TCO2: 24 mmol/L (ref 22–32)
TCO2: 24 mmol/L (ref 22–32)
TCO2: 24 mmol/L (ref 22–32)
TCO2: 24 mmol/L (ref 22–32)
TCO2: 24 mmol/L (ref 22–32)
TCO2: 21 mmol/L — AB (ref 22–32)
TCO2: 22 mmol/L (ref 22–32)

## 2017-12-29 LAB — CREATININE, SERUM
Creatinine, Ser: 1.97 mg/dL — ABNORMAL HIGH (ref 0.61–1.24)
GFR calc Af Amer: 37 mL/min — ABNORMAL LOW (ref >60)
GFR calc non Af Amer: 32 mL/min — ABNORMAL LOW (ref >60)

## 2017-12-29 LAB — POCT I-STAT 4, (NA,K, GLUC, HGB,HCT)
Glucose, Bld: 97 mg/dL (ref 70–99)
HCT: 25 % — ABNORMAL LOW (ref 39.0–52.0)
Hemoglobin: 8.5 g/dL — ABNORMAL LOW (ref 13.0–17.0)
Potassium: 4.9 mmol/L (ref 3.5–5.1)
Sodium: 139 mmol/L (ref 135–145)

## 2017-12-29 LAB — COMPREHENSIVE METABOLIC PANEL
ALT: 22 U/L (ref 0–44)
AST: 17 U/L (ref 15–41)
Albumin: 3 g/dL — ABNORMAL LOW (ref 3.5–5.0)
Alkaline Phosphatase: 40 U/L (ref 38–126)
Anion gap: 9 (ref 5–15)
BUN: 29 mg/dL — ABNORMAL HIGH (ref 8–23)
CALCIUM: 8.8 mg/dL — AB (ref 8.9–10.3)
CHLORIDE: 108 mmol/L (ref 98–111)
CO2: 22 mmol/L (ref 22–32)
Creatinine, Ser: 2.3 mg/dL — ABNORMAL HIGH (ref 0.61–1.24)
GFR, EST AFRICAN AMERICAN: 31 mL/min — AB (ref >60)
GFR, EST NON AFRICAN AMERICAN: 27 mL/min — AB (ref >60)
Glucose, Bld: 91 mg/dL (ref 70–99)
Potassium: 4.8 mmol/L (ref 3.5–5.1)
SODIUM: 139 mmol/L (ref 135–145)
Total Bilirubin: 0.6 mg/dL (ref 0.3–1.2)
Total Protein: 5.7 g/dL — ABNORMAL LOW (ref 6.5–8.1)

## 2017-12-29 LAB — PREPARE RBC (CROSSMATCH)

## 2017-12-29 LAB — PROTIME-INR
INR: 1.06
INR: 1.52
PROTHROMBIN TIME: 18.1 s — AB (ref 11.4–15.2)
Prothrombin Time: 13.7 seconds (ref 11.4–15.2)

## 2017-12-29 LAB — CBC
HCT: 25.5 % — ABNORMAL LOW (ref 39.0–52.0)
HCT: 28.1 % — ABNORMAL LOW (ref 39.0–52.0)
HEMATOCRIT: 34 % — AB (ref 39.0–52.0)
HEMOGLOBIN: 11.5 g/dL — AB (ref 13.0–17.0)
Hemoglobin: 8.4 g/dL — ABNORMAL LOW (ref 13.0–17.0)
Hemoglobin: 9.3 g/dL — ABNORMAL LOW (ref 13.0–17.0)
MCH: 30.4 pg (ref 26.0–34.0)
MCH: 30.4 pg (ref 26.0–34.0)
MCH: 30.4 pg (ref 26.0–34.0)
MCHC: 32.9 g/dL (ref 30.0–36.0)
MCHC: 33.1 g/dL (ref 30.0–36.0)
MCHC: 33.8 g/dL (ref 30.0–36.0)
MCV: 92.4 fL (ref 78.0–100.0)
MCV: 91.8 fL (ref 78.0–100.0)
MCV: 89.9 fL (ref 78.0–100.0)
PLATELETS: 220 10*3/uL (ref 150–400)
PLATELETS: 88 10*3/uL — AB (ref 150–400)
Platelets: 118 10*3/uL — ABNORMAL LOW (ref 150–400)
RBC: 2.76 MIL/uL — ABNORMAL LOW (ref 4.22–5.81)
RBC: 3.06 MIL/uL — ABNORMAL LOW (ref 4.22–5.81)
RBC: 3.78 MIL/uL — ABNORMAL LOW (ref 4.22–5.81)
RDW: 13.2 % (ref 11.5–15.5)
RDW: 13.7 % (ref 11.5–15.5)
RDW: 14.3 % (ref 11.5–15.5)
WBC: 10.1 10*3/uL (ref 4.0–10.5)
WBC: 14.7 10*3/uL — ABNORMAL HIGH (ref 4.0–10.5)
WBC: 15.2 10*3/uL — ABNORMAL HIGH (ref 4.0–10.5)

## 2017-12-29 LAB — GLUCOSE, CAPILLARY
GLUCOSE-CAPILLARY: 92 mg/dL (ref 70–99)
GLUCOSE-CAPILLARY: 107 mg/dL — AB (ref 70–99)
Glucose-Capillary: 75 mg/dL (ref 70–99)
Glucose-Capillary: 122 mg/dL — ABNORMAL HIGH (ref 70–99)

## 2017-12-29 LAB — PLATELET COUNT: Platelets: 128 10*3/uL — ABNORMAL LOW (ref 150–400)

## 2017-12-29 LAB — HEMOGLOBIN AND HEMATOCRIT, BLOOD
HEMATOCRIT: 27.1 % — AB (ref 39.0–52.0)
HEMOGLOBIN: 8.9 g/dL — AB (ref 13.0–17.0)

## 2017-12-29 LAB — APTT
aPTT: 86 seconds — ABNORMAL HIGH (ref 24–36)
aPTT: 41 seconds — ABNORMAL HIGH (ref 24–36)

## 2017-12-29 LAB — HEPARIN LEVEL (UNFRACTIONATED): Heparin Unfractionated: 0.43 IU/mL (ref 0.30–0.70)

## 2017-12-29 LAB — MAGNESIUM: Magnesium: 1.9 mg/dL (ref 1.7–2.4)

## 2017-12-29 SURGERY — CORONARY ARTERY BYPASS GRAFTING (CABG)
Anesthesia: General | Site: Chest

## 2017-12-29 MED ORDER — 0.9 % SODIUM CHLORIDE (POUR BTL) OPTIME
TOPICAL | Status: DC | PRN
  Administered 2017-12-29: 6000 mL

## 2017-12-29 MED ORDER — METOPROLOL TARTRATE 5 MG/5ML IV SOLN
2.5000 mg | INTRAVENOUS | Status: DC | PRN
  Administered 2017-12-30 (×2): 5 mg via INTRAVENOUS
  Administered 2017-12-31: 2.5 mg via INTRAVENOUS
  Filled 2017-12-29 (×4): qty 5

## 2017-12-29 MED ORDER — HEPARIN SODIUM (PORCINE) 1000 UNIT/ML IJ SOLN
INTRAMUSCULAR | Status: DC | PRN
  Administered 2017-12-29: 30000 [IU] via INTRAVENOUS

## 2017-12-29 MED ORDER — SODIUM CHLORIDE 0.9 % IV SOLN
1.5000 g | Freq: Two times a day (BID) | INTRAVENOUS | Status: AC
  Administered 2017-12-29 – 2017-12-31 (×4): 1.5 g via INTRAVENOUS
  Filled 2017-12-29 (×4): qty 1.5

## 2017-12-29 MED ORDER — FENTANYL CITRATE (PF) 250 MCG/5ML IJ SOLN
INTRAMUSCULAR | Status: AC
  Filled 2017-12-29: qty 5

## 2017-12-29 MED ORDER — MORPHINE SULFATE (PF) 2 MG/ML IV SOLN
1.0000 mg | INTRAVENOUS | Status: AC | PRN
  Filled 2017-12-29: qty 2

## 2017-12-29 MED ORDER — DOCUSATE SODIUM 100 MG PO CAPS
200.0000 mg | ORAL_CAPSULE | Freq: Every day | ORAL | Status: DC
  Administered 2017-12-30 – 2017-12-31 (×2): 200 mg via ORAL
  Filled 2017-12-29 (×2): qty 2

## 2017-12-29 MED ORDER — SODIUM CHLORIDE 0.9 % IJ SOLN
INTRAMUSCULAR | Status: AC
  Filled 2017-12-29: qty 40

## 2017-12-29 MED ORDER — DEXMEDETOMIDINE HCL IN NACL 400 MCG/100ML IV SOLN
0.0000 ug/kg/h | INTRAVENOUS | Status: DC
  Administered 2017-12-29: 1 ug/kg/h via INTRAVENOUS
  Administered 2017-12-29: 1.2 ug/kg/h via INTRAVENOUS
  Administered 2017-12-30: 0.9 ug/kg/h via INTRAVENOUS
  Filled 2017-12-29: qty 200
  Filled 2017-12-29: qty 100

## 2017-12-29 MED ORDER — ACETAMINOPHEN 160 MG/5ML PO SOLN: 650.0000 mg | Freq: Once | ORAL | Status: AC

## 2017-12-29 MED ORDER — SODIUM CHLORIDE 0.9% IV SOLUTION
Freq: Once | INTRAVENOUS | Status: AC
  Administered 2017-12-29: 16:00:00 via INTRAVENOUS

## 2017-12-29 MED ORDER — ACETAMINOPHEN 160 MG/5ML PO SOLN
1000.0000 mg | Freq: Four times a day (QID) | ORAL | Status: DC
  Administered 2017-12-29 – 2017-12-30 (×2): 1000 mg
  Filled 2017-12-29 (×2): qty 40.6

## 2017-12-29 MED ORDER — SODIUM CHLORIDE 0.9 % IV SOLN
INTRAVENOUS | Status: DC | PRN
  Administered 2017-12-29: 20 ug/min via INTRAVENOUS

## 2017-12-29 MED ORDER — SODIUM CHLORIDE 0.9% FLUSH
3.0000 mL | Freq: Two times a day (BID) | INTRAVENOUS | Status: DC
  Administered 2017-12-30 – 2017-12-31 (×4): 3 mL via INTRAVENOUS

## 2017-12-29 MED ORDER — ALBUMIN HUMAN 5 % IV SOLN
INTRAVENOUS | Status: DC | PRN
  Administered 2017-12-29: 13:00:00 via INTRAVENOUS

## 2017-12-29 MED ORDER — SODIUM BICARBONATE 8.4 % IV SOLN
100.0000 meq | Freq: Once | INTRAVENOUS | Status: AC
  Administered 2017-12-29: 100 meq via INTRAVENOUS

## 2017-12-29 MED ORDER — ALBUMIN HUMAN 5 % IV SOLN
250.0000 mL | INTRAVENOUS | Status: AC | PRN
  Administered 2017-12-29 – 2017-12-30 (×4): 250 mL via INTRAVENOUS
  Filled 2017-12-29 (×2): qty 250

## 2017-12-29 MED ORDER — INSULIN ASPART 100 UNIT/ML ~~LOC~~ SOLN
0.0000 [IU] | SUBCUTANEOUS | Status: DC
  Administered 2017-12-29 – 2017-12-30 (×4): 2 [IU] via SUBCUTANEOUS

## 2017-12-29 MED ORDER — ARTIFICIAL TEARS OPHTHALMIC OINT
TOPICAL_OINTMENT | OPHTHALMIC | Status: AC
  Filled 2017-12-29: qty 3.5

## 2017-12-29 MED ORDER — LACTATED RINGERS IV SOLN
INTRAVENOUS | Status: DC | PRN
  Administered 2017-12-29: 07:00:00 via INTRAVENOUS

## 2017-12-29 MED ORDER — SODIUM CHLORIDE 0.9 % IV SOLN
INTRAVENOUS | Status: DC
  Filled 2017-12-29: qty 1

## 2017-12-29 MED ORDER — CHLORHEXIDINE GLUCONATE 0.12 % MT SOLN
15.0000 mL | OROMUCOSAL | Status: AC
  Administered 2017-12-29: 15 mL via OROMUCOSAL
  Filled 2017-12-29: qty 15

## 2017-12-29 MED ORDER — LACTATED RINGERS IV SOLN
INTRAVENOUS | Status: DC
  Administered 2017-12-29: 16:00:00 via INTRAVENOUS

## 2017-12-29 MED ORDER — SODIUM CHLORIDE 0.9 % IV SOLN
INTRAVENOUS | Status: DC | PRN
  Administered 2017-12-29: 0.2 ug/kg/h via INTRAVENOUS

## 2017-12-29 MED ORDER — ROCURONIUM BROMIDE 50 MG/5ML IV SOSY
PREFILLED_SYRINGE | INTRAVENOUS | Status: AC
  Filled 2017-12-29: qty 20

## 2017-12-29 MED ORDER — LACTATED RINGERS IV SOLN: INTRAVENOUS | Status: DC

## 2017-12-29 MED ORDER — NITROPRUSSIDE SODIUM 25 MG/ML IV SOLN
0.3000 ug/kg/min | INTRAVENOUS | Status: DC
  Filled 2017-12-29: qty 2

## 2017-12-29 MED ORDER — NITROPRUSSIDE SODIUM-NACL 50-0.9 MG/100ML-% IV SOLN
INTRAVENOUS | Status: DC
  Filled 2017-12-29 (×2): qty 100

## 2017-12-29 MED ORDER — NITROPRUSSIDE SODIUM-NACL 10-0.9 MG/50ML-% IV SOLN
0.0000 ug/kg/min | INTRAVENOUS | Status: DC
  Filled 2017-12-29: qty 50

## 2017-12-29 MED ORDER — SODIUM CHLORIDE 0.9 % IV SOLN
INTRAVENOUS | Status: DC | PRN
  Administered 2017-12-29: .8 [IU]/h via INTRAVENOUS

## 2017-12-29 MED ORDER — SODIUM CHLORIDE 0.9% FLUSH
10.0000 mL | Freq: Two times a day (BID) | INTRAVENOUS | Status: DC
  Administered 2017-12-29 – 2017-12-31 (×4): 10 mL

## 2017-12-29 MED ORDER — MORPHINE SULFATE (PF) 2 MG/ML IV SOLN
2.0000 mg | INTRAVENOUS | Status: DC | PRN
  Administered 2017-12-29: 2 mg via INTRAVENOUS
  Administered 2017-12-29: 4 mg via INTRAVENOUS
  Administered 2017-12-30 (×2): 2 mg via INTRAVENOUS
  Filled 2017-12-29: qty 2
  Filled 2017-12-29: qty 1

## 2017-12-29 MED ORDER — ORAL CARE MOUTH RINSE
15.0000 mL | OROMUCOSAL | Status: DC
  Administered 2017-12-29 – 2017-12-30 (×6): 15 mL via OROMUCOSAL

## 2017-12-29 MED ORDER — VANCOMYCIN HCL IN DEXTROSE 1-5 GM/200ML-% IV SOLN
1000.0000 mg | Freq: Once | INTRAVENOUS | Status: AC
  Administered 2017-12-29: 1000 mg via INTRAVENOUS
  Filled 2017-12-29: qty 200

## 2017-12-29 MED ORDER — MIDAZOLAM HCL 10 MG/2ML IJ SOLN
INTRAMUSCULAR | Status: AC
  Filled 2017-12-29: qty 2

## 2017-12-29 MED ORDER — SUCCINYLCHOLINE CHLORIDE 200 MG/10ML IV SOSY
PREFILLED_SYRINGE | INTRAVENOUS | Status: AC
  Filled 2017-12-29: qty 10

## 2017-12-29 MED ORDER — NITROPRUSSIDE SODIUM 25 MG/ML IV SOLN
INTRAVENOUS | Status: DC | PRN
  Administered 2017-12-29: 0.3 ug/kg/min via INTRAVENOUS

## 2017-12-29 MED ORDER — EPHEDRINE 5 MG/ML INJ
INTRAVENOUS | Status: AC
  Filled 2017-12-29: qty 10

## 2017-12-29 MED ORDER — PROPOFOL 10 MG/ML IV BOLUS
INTRAVENOUS | Status: AC
  Filled 2017-12-29: qty 20

## 2017-12-29 MED ORDER — INSULIN REGULAR BOLUS VIA INFUSION
0.0000 [IU] | Freq: Three times a day (TID) | INTRAVENOUS | Status: DC
  Filled 2017-12-29: qty 10

## 2017-12-29 MED ORDER — PHENYLEPHRINE HCL-NACL 20-0.9 MG/250ML-% IV SOLN
0.0000 ug/min | INTRAVENOUS | Status: DC
  Filled 2017-12-29: qty 250

## 2017-12-29 MED ORDER — ACETAMINOPHEN 500 MG PO TABS
1000.0000 mg | ORAL_TABLET | Freq: Four times a day (QID) | ORAL | Status: DC
  Administered 2017-12-30 – 2018-01-01 (×7): 1000 mg via ORAL
  Filled 2017-12-29 (×7): qty 2

## 2017-12-29 MED ORDER — OXYCODONE HCL 5 MG PO TABS
5.0000 mg | ORAL_TABLET | ORAL | Status: DC | PRN
  Administered 2017-12-30 (×4): 5 mg via ORAL
  Administered 2017-12-31 – 2018-01-01 (×6): 10 mg via ORAL
  Administered 2018-01-01: 5 mg via ORAL
  Filled 2017-12-29: qty 2
  Filled 2017-12-29: qty 1
  Filled 2017-12-29: qty 2
  Filled 2017-12-29: qty 1
  Filled 2017-12-29 (×2): qty 2
  Filled 2017-12-29: qty 1
  Filled 2017-12-29 (×2): qty 2
  Filled 2017-12-29: qty 1
  Filled 2017-12-29: qty 2

## 2017-12-29 MED ORDER — DOPAMINE-DEXTROSE 3.2-5 MG/ML-% IV SOLN: 3.0000 ug/kg/min | INTRAVENOUS | Status: DC

## 2017-12-29 MED ORDER — CHLORHEXIDINE GLUCONATE 0.12% ORAL RINSE (MEDLINE KIT)
15.0000 mL | Freq: Two times a day (BID) | OROMUCOSAL | Status: DC
  Administered 2017-12-29: 15 mL via OROMUCOSAL

## 2017-12-29 MED ORDER — PROPOFOL 10 MG/ML IV BOLUS
INTRAVENOUS | Status: DC | PRN
  Administered 2017-12-29: 100 mg via INTRAVENOUS

## 2017-12-29 MED ORDER — SODIUM CHLORIDE 0.45 % IV SOLN
INTRAVENOUS | Status: DC | PRN
  Administered 2017-12-29: 15:00:00 via INTRAVENOUS

## 2017-12-29 MED ORDER — BISACODYL 5 MG PO TBEC
10.0000 mg | DELAYED_RELEASE_TABLET | Freq: Every day | ORAL | Status: DC
  Administered 2017-12-30 – 2017-12-31 (×2): 10 mg via ORAL
  Filled 2017-12-29 (×2): qty 2

## 2017-12-29 MED ORDER — ROCURONIUM BROMIDE 100 MG/10ML IV SOLN
INTRAVENOUS | Status: DC | PRN
  Administered 2017-12-29 (×2): 20 mg via INTRAVENOUS
  Administered 2017-12-29 (×3): 50 mg via INTRAVENOUS
  Administered 2017-12-29: 30 mg via INTRAVENOUS

## 2017-12-29 MED ORDER — CHLORHEXIDINE GLUCONATE CLOTH 2 % EX PADS
6.0000 | MEDICATED_PAD | Freq: Every day | CUTANEOUS | Status: DC
  Administered 2017-12-31: 6 via TOPICAL

## 2017-12-29 MED ORDER — SODIUM CHLORIDE 0.9% FLUSH
3.0000 mL | INTRAVENOUS | Status: DC | PRN
  Administered 2017-12-30: 3 mL via INTRAVENOUS
  Filled 2017-12-29: qty 3

## 2017-12-29 MED ORDER — BISACODYL 10 MG RE SUPP: 10.0000 mg | Freq: Every day | RECTAL | Status: DC

## 2017-12-29 MED ORDER — PANTOPRAZOLE SODIUM 40 MG PO TBEC
40.0000 mg | DELAYED_RELEASE_TABLET | Freq: Every day | ORAL | Status: DC
  Administered 2017-12-31: 40 mg via ORAL
  Filled 2017-12-29: qty 1

## 2017-12-29 MED ORDER — ONDANSETRON HCL 4 MG/2ML IJ SOLN: 4.0000 mg | Freq: Four times a day (QID) | INTRAMUSCULAR | Status: DC | PRN

## 2017-12-29 MED ORDER — PROTAMINE SULFATE 10 MG/ML IV SOLN
INTRAVENOUS | Status: DC | PRN
  Administered 2017-12-29: 10 mg via INTRAVENOUS
  Administered 2017-12-29: 290 mg via INTRAVENOUS

## 2017-12-29 MED ORDER — MIDAZOLAM HCL 2 MG/2ML IJ SOLN
2.0000 mg | INTRAMUSCULAR | Status: DC | PRN
  Administered 2017-12-29 (×3): 2 mg via INTRAVENOUS
  Filled 2017-12-29 (×3): qty 2

## 2017-12-29 MED ORDER — THROMBIN 20000 UNITS EX SOLR
CUTANEOUS | Status: DC | PRN
  Administered 2017-12-29: 20000 [IU] via TOPICAL

## 2017-12-29 MED ORDER — METOPROLOL TARTRATE 12.5 MG HALF TABLET: 12.5000 mg | ORAL_TABLET | Freq: Two times a day (BID) | ORAL | Status: DC

## 2017-12-29 MED ORDER — FUROSEMIDE 10 MG/ML IJ SOLN
INTRAMUSCULAR | Status: DC | PRN
  Administered 2017-12-29: 40 mg via INTRAMUSCULAR

## 2017-12-29 MED ORDER — TRAMADOL HCL 50 MG PO TABS
50.0000 mg | ORAL_TABLET | ORAL | Status: DC | PRN
  Administered 2017-12-30 – 2017-12-31 (×2): 100 mg via ORAL
  Filled 2017-12-29 (×2): qty 2

## 2017-12-29 MED ORDER — HEPARIN SODIUM (PORCINE) 1000 UNIT/ML IJ SOLN: INTRAMUSCULAR | Status: DC | PRN

## 2017-12-29 MED ORDER — FENTANYL CITRATE (PF) 250 MCG/5ML IJ SOLN
INTRAMUSCULAR | Status: DC | PRN
  Administered 2017-12-29: 250 ug via INTRAVENOUS
  Administered 2017-12-29 (×2): 200 ug via INTRAVENOUS
  Administered 2017-12-29 (×2): 250 ug via INTRAVENOUS
  Administered 2017-12-29: 300 ug via INTRAVENOUS
  Administered 2017-12-29: 250 ug via INTRAVENOUS
  Administered 2017-12-29: 50 ug via INTRAVENOUS

## 2017-12-29 MED ORDER — FAMOTIDINE IN NACL 20-0.9 MG/50ML-% IV SOLN
20.0000 mg | Freq: Two times a day (BID) | INTRAVENOUS | Status: AC
  Administered 2017-12-29 (×2): 20 mg via INTRAVENOUS
  Filled 2017-12-29: qty 50

## 2017-12-29 MED ORDER — FENTANYL CITRATE (PF) 250 MCG/5ML IJ SOLN
INTRAMUSCULAR | Status: AC
  Filled 2017-12-29: qty 30

## 2017-12-29 MED ORDER — LIDOCAINE HCL (CARDIAC) PF 100 MG/5ML IV SOSY
PREFILLED_SYRINGE | INTRAVENOUS | Status: DC | PRN
  Administered 2017-12-29: 100 mg via INTRAVENOUS

## 2017-12-29 MED ORDER — SODIUM CHLORIDE 0.9 % IV SOLN: 250.0000 mL | INTRAVENOUS | Status: DC

## 2017-12-29 MED ORDER — SODIUM CHLORIDE 0.9% IV SOLUTION: Freq: Once | INTRAVENOUS | Status: DC

## 2017-12-29 MED ORDER — PROTAMINE SULFATE 10 MG/ML IV SOLN
INTRAVENOUS | Status: AC
  Filled 2017-12-29: qty 25

## 2017-12-29 MED ORDER — FUROSEMIDE 10 MG/ML IJ SOLN
40.0000 mg | INTRAMUSCULAR | Status: DC
  Filled 2017-12-29: qty 4

## 2017-12-29 MED ORDER — ASPIRIN 81 MG PO CHEW: 324.0000 mg | CHEWABLE_TABLET | Freq: Every day | ORAL | Status: DC

## 2017-12-29 MED ORDER — NITROPRUSSIDE SODIUM-NACL 10-0.9 MG/50ML-% IV SOLN
0.0000 ug/kg/min | INTRAVENOUS | Status: DC
  Administered 2017-12-30: 1.1 ug/kg/min via INTRAVENOUS
  Administered 2017-12-30: 1.2 ug/kg/min via INTRAVENOUS
  Administered 2017-12-30: 1 ug/kg/min via INTRAVENOUS
  Administered 2017-12-30: 1.201 ug/kg/min via INTRAVENOUS
  Administered 2017-12-30 (×5): 1.2 ug/kg/min via INTRAVENOUS
  Administered 2017-12-30: 1 ug/kg/min via INTRAVENOUS
  Administered 2017-12-30: 1.2 ug/kg/min via INTRAVENOUS
  Administered 2017-12-30: 0.8 ug/kg/min via INTRAVENOUS
  Administered 2017-12-30 (×3): 1.2 ug/kg/min via INTRAVENOUS
  Filled 2017-12-29 (×20): qty 50

## 2017-12-29 MED ORDER — POTASSIUM CHLORIDE 10 MEQ/50ML IV SOLN: 10.0000 meq | INTRAVENOUS | Status: AC

## 2017-12-29 MED ORDER — LACTATED RINGERS IV SOLN: 500.0000 mL | Freq: Once | INTRAVENOUS | Status: DC | PRN

## 2017-12-29 MED ORDER — THROMBIN 20000 UNITS EX KIT
PACK | CUTANEOUS | Status: AC
  Filled 2017-12-29: qty 1

## 2017-12-29 MED ORDER — DOPAMINE-DEXTROSE 3.2-5 MG/ML-% IV SOLN
INTRAVENOUS | Status: DC | PRN
  Administered 2017-12-29: 2 ug/kg/min via INTRAVENOUS

## 2017-12-29 MED ORDER — HEMOSTATIC AGENTS (NO CHARGE) OPTIME
TOPICAL | Status: DC | PRN
  Administered 2017-12-29 (×2): 2 via TOPICAL
  Administered 2017-12-29: 1 via TOPICAL

## 2017-12-29 MED ORDER — MIDAZOLAM HCL 5 MG/5ML IJ SOLN
INTRAMUSCULAR | Status: DC | PRN
  Administered 2017-12-29: 2 mg via INTRAVENOUS
  Administered 2017-12-29: 4 mg via INTRAVENOUS
  Administered 2017-12-29: 2 mg via INTRAVENOUS
  Administered 2017-12-29: 4 mg via INTRAVENOUS

## 2017-12-29 MED ORDER — SODIUM CHLORIDE 0.9 % IV SOLN
INTRAVENOUS | Status: DC
  Administered 2017-12-29 (×2): via INTRAVENOUS

## 2017-12-29 MED ORDER — METOPROLOL TARTRATE 25 MG/10 ML ORAL SUSPENSION: 12.5000 mg | Freq: Two times a day (BID) | ORAL | Status: DC

## 2017-12-29 MED ORDER — SODIUM CHLORIDE 0.9 % IV SOLN
INTRAVENOUS | Status: DC | PRN
  Administered 2017-12-29: 40 ug/min via INTRAVENOUS

## 2017-12-29 MED ORDER — PROTAMINE SULFATE 10 MG/ML IV SOLN
INTRAVENOUS | Status: AC
  Filled 2017-12-29: qty 20

## 2017-12-29 MED ORDER — HEPARIN SODIUM (PORCINE) 1000 UNIT/ML IJ SOLN
INTRAMUSCULAR | Status: AC
  Filled 2017-12-29: qty 1

## 2017-12-29 MED ORDER — LIDOCAINE 2% (20 MG/ML) 5 ML SYRINGE
INTRAMUSCULAR | Status: AC
  Filled 2017-12-29: qty 5

## 2017-12-29 MED ORDER — LACTATED RINGERS IV SOLN
INTRAVENOUS | Status: DC | PRN
  Administered 2017-12-29 (×3): via INTRAVENOUS

## 2017-12-29 MED ORDER — NITROGLYCERIN IN D5W 200-5 MCG/ML-% IV SOLN: 0.0000 ug/min | INTRAVENOUS | Status: DC

## 2017-12-29 MED ORDER — ASPIRIN EC 325 MG PO TBEC
325.0000 mg | DELAYED_RELEASE_TABLET | Freq: Every day | ORAL | Status: DC
  Administered 2017-12-30 – 2017-12-31 (×2): 325 mg via ORAL
  Filled 2017-12-29 (×2): qty 1

## 2017-12-29 MED ORDER — ACETAMINOPHEN 650 MG RE SUPP
650.0000 mg | Freq: Once | RECTAL | Status: AC
  Administered 2017-12-29: 650 mg via RECTAL

## 2017-12-29 MED ORDER — MAGNESIUM SULFATE 4 GM/100ML IV SOLN: 4.0000 g | Freq: Once | INTRAVENOUS | Status: DC

## 2017-12-29 SURGICAL SUPPLY — 120 items
ADAPTER CARDIO PERF ANTE/RETRO (ADAPTER) ×4 IMPLANT
BAG DECANTER FOR FLEXI CONT (MISCELLANEOUS) ×4 IMPLANT
BANDAGE ACE 4X5 VEL STRL LF (GAUZE/BANDAGES/DRESSINGS) ×4 IMPLANT
BANDAGE ACE 6X5 VEL STRL LF (GAUZE/BANDAGES/DRESSINGS) ×4 IMPLANT
BASKET HEART  (ORDER IN 25'S) (MISCELLANEOUS) ×1
BASKET HEART (ORDER IN 25'S) (MISCELLANEOUS) ×1
BASKET HEART (ORDER IN 25S) (MISCELLANEOUS) ×2 IMPLANT
BLADE STERNUM SYSTEM 6 (BLADE) ×4 IMPLANT
BLADE SURG 15 STRL LF DISP TIS (BLADE) ×2 IMPLANT
BLADE SURG 15 STRL SS (BLADE) ×2
BNDG GAUZE ELAST 4 BULKY (GAUZE/BANDAGES/DRESSINGS) ×4 IMPLANT
CANISTER SUCT 3000ML PPV (MISCELLANEOUS) ×4 IMPLANT
CANNULA GUNDRY RCSP 15FR (MISCELLANEOUS) ×4 IMPLANT
CANNULA SUMP PERICARDIAL (CANNULA) ×4 IMPLANT
CATH HEART VENT LEFT (CATHETERS) ×2 IMPLANT
CATH ROBINSON RED A/P 18FR (CATHETERS) ×12 IMPLANT
CATH THORACIC 28FR (CATHETERS) ×4 IMPLANT
CATH THORACIC 36FR (CATHETERS) ×4 IMPLANT
CATH THORACIC 36FR RT ANG (CATHETERS) ×4 IMPLANT
CLIP VESOCCLUDE MED 24/CT (CLIP) IMPLANT
CLIP VESOCCLUDE SM WIDE 24/CT (CLIP) IMPLANT
CONT SPEC 4OZ CLIKSEAL STRL BL (MISCELLANEOUS) ×4 IMPLANT
COVER MAYO STAND STRL (DRAPES) ×4 IMPLANT
COVER SURGICAL LIGHT HANDLE (MISCELLANEOUS) ×4 IMPLANT
CRADLE DONUT ADULT HEAD (MISCELLANEOUS) ×4 IMPLANT
DERMABOND ADVANCED (GAUZE/BANDAGES/DRESSINGS) ×2
DERMABOND ADVANCED .7 DNX12 (GAUZE/BANDAGES/DRESSINGS) ×2 IMPLANT
DRAPE CARDIOVASCULAR INCISE (DRAPES) ×2
DRAPE SLUSH/WARMER DISC (DRAPES) ×4 IMPLANT
DRAPE SRG 135X102X78XABS (DRAPES) ×2 IMPLANT
DRSG AQUACEL AG ADV 3.5X14 (GAUZE/BANDAGES/DRESSINGS) ×4 IMPLANT
DRSG COVADERM 4X14 (GAUZE/BANDAGES/DRESSINGS) ×4 IMPLANT
ELECT CAUTERY BLADE 6.4 (BLADE) ×4 IMPLANT
ELECT REM PT RETURN 9FT ADLT (ELECTROSURGICAL) ×8
ELECTRODE REM PT RTRN 9FT ADLT (ELECTROSURGICAL) ×4 IMPLANT
FELT TEFLON 1X6 (MISCELLANEOUS) ×8 IMPLANT
GAUZE SPONGE 4X4 12PLY STRL (GAUZE/BANDAGES/DRESSINGS) ×8 IMPLANT
GLOVE BIO SURGEON STRL SZ 6 (GLOVE) IMPLANT
GLOVE BIO SURGEON STRL SZ 6.5 (GLOVE) IMPLANT
GLOVE BIO SURGEON STRL SZ7 (GLOVE) IMPLANT
GLOVE BIO SURGEON STRL SZ7.5 (GLOVE) ×8 IMPLANT
GLOVE BIO SURGEONS STRL SZ 6.5 (GLOVE)
GLOVE BIOGEL PI IND STRL 6 (GLOVE) IMPLANT
GLOVE BIOGEL PI IND STRL 6.5 (GLOVE) IMPLANT
GLOVE BIOGEL PI IND STRL 7.0 (GLOVE) IMPLANT
GLOVE BIOGEL PI INDICATOR 6 (GLOVE)
GLOVE BIOGEL PI INDICATOR 6.5 (GLOVE)
GLOVE BIOGEL PI INDICATOR 7.0 (GLOVE)
GLOVE EUDERMIC 7 POWDERFREE (GLOVE) ×8 IMPLANT
GLOVE ORTHO TXT STRL SZ7.5 (GLOVE) IMPLANT
GOWN STRL REUS W/ TWL LRG LVL3 (GOWN DISPOSABLE) ×16 IMPLANT
GOWN STRL REUS W/ TWL XL LVL3 (GOWN DISPOSABLE) ×2 IMPLANT
GOWN STRL REUS W/TWL LRG LVL3 (GOWN DISPOSABLE) ×16
GOWN STRL REUS W/TWL XL LVL3 (GOWN DISPOSABLE) ×2
HEMOSTAT ARISTA ABSORB 3G PWDR (MISCELLANEOUS) ×8 IMPLANT
HEMOSTAT POWDER SURGIFOAM 1G (HEMOSTASIS) ×12 IMPLANT
HEMOSTAT SURGICEL 2X14 (HEMOSTASIS) ×8 IMPLANT
INSERT FOGARTY 61MM (MISCELLANEOUS) IMPLANT
INSERT FOGARTY XLG (MISCELLANEOUS) IMPLANT
KIT BASIN OR (CUSTOM PROCEDURE TRAY) ×4 IMPLANT
KIT CATH CPB BARTLE (MISCELLANEOUS) ×4 IMPLANT
KIT SUCTION CATH 14FR (SUCTIONS) ×4 IMPLANT
KIT TURNOVER KIT B (KITS) ×4 IMPLANT
KIT VASOVIEW HEMOPRO VH 3000 (KITS) ×4 IMPLANT
LINE VENT (MISCELLANEOUS) ×4 IMPLANT
NS IRRIG 1000ML POUR BTL (IV SOLUTION) ×24 IMPLANT
PACK E OPEN HEART (SUTURE) ×4 IMPLANT
PACK OPEN HEART (CUSTOM PROCEDURE TRAY) ×4 IMPLANT
PAD ARMBOARD 7.5X6 YLW CONV (MISCELLANEOUS) ×8 IMPLANT
PAD ELECT DEFIB RADIOL ZOLL (MISCELLANEOUS) ×4 IMPLANT
PENCIL BUTTON HOLSTER BLD 10FT (ELECTRODE) ×4 IMPLANT
PUNCH AORTIC ROTATE 4.0MM (MISCELLANEOUS) IMPLANT
PUNCH AORTIC ROTATE 4.5MM 8IN (MISCELLANEOUS) ×4 IMPLANT
PUNCH AORTIC ROTATE 5MM 8IN (MISCELLANEOUS) IMPLANT
SET CARDIOPLEGIA MPS 5001102 (MISCELLANEOUS) ×4 IMPLANT
SLEEVE SURGEON STRL (DRAPES) ×4 IMPLANT
SPONGE INTESTINAL PEANUT (DISPOSABLE) IMPLANT
SPONGE LAP 18X18 RF (DISPOSABLE) ×4 IMPLANT
SPONGE LAP 18X18 X RAY DECT (DISPOSABLE) IMPLANT
SPONGE LAP 4X18 RFD (DISPOSABLE) ×4 IMPLANT
SURGIFLO W/THROMBIN 8M KIT (HEMOSTASIS) ×4 IMPLANT
SUT BONE WAX W31G (SUTURE) ×4 IMPLANT
SUT ETHIBON 2 0 V 52N 30 (SUTURE) ×8 IMPLANT
SUT ETHIBOND 2 0 SH (SUTURE) ×2
SUT ETHIBOND 2 0 SH 36X2 (SUTURE) ×2 IMPLANT
SUT MNCRL AB 4-0 PS2 18 (SUTURE) IMPLANT
SUT PROLENE 3 0 SH DA (SUTURE) IMPLANT
SUT PROLENE 3 0 SH1 36 (SUTURE) ×8 IMPLANT
SUT PROLENE 4 0 RB 1 (SUTURE) ×10
SUT PROLENE 4 0 SH DA (SUTURE) IMPLANT
SUT PROLENE 4-0 RB1 .5 CRCL 36 (SUTURE) ×10 IMPLANT
SUT PROLENE 5 0 C 1 36 (SUTURE) IMPLANT
SUT PROLENE 6 0 C 1 30 (SUTURE) IMPLANT
SUT PROLENE 7 0 BV 1 (SUTURE) IMPLANT
SUT PROLENE 7 0 BV1 MDA (SUTURE) ×8 IMPLANT
SUT PROLENE 8 0 BV175 6 (SUTURE) IMPLANT
SUT SILK  1 MH (SUTURE)
SUT SILK 1 MH (SUTURE) IMPLANT
SUT STEEL 6MS V (SUTURE) IMPLANT
SUT STEEL STERNAL CCS#1 18IN (SUTURE) IMPLANT
SUT STEEL SZ 6 DBL 3X14 BALL (SUTURE) IMPLANT
SUT VIC AB 1 CTX 36 (SUTURE) ×4
SUT VIC AB 1 CTX36XBRD ANBCTR (SUTURE) ×4 IMPLANT
SUT VIC AB 2-0 CT1 27 (SUTURE) ×2
SUT VIC AB 2-0 CT1 TAPERPNT 27 (SUTURE) ×2 IMPLANT
SUT VIC AB 2-0 CTX 27 (SUTURE) IMPLANT
SUT VIC AB 3-0 SH 27 (SUTURE)
SUT VIC AB 3-0 SH 27X BRD (SUTURE) IMPLANT
SUT VIC AB 3-0 X1 27 (SUTURE) ×4 IMPLANT
SUT VICRYL 4-0 PS2 18IN ABS (SUTURE) IMPLANT
SYSTEM SAHARA CHEST DRAIN ATS (WOUND CARE) ×4 IMPLANT
TAPE CLOTH SURG 4X10 WHT LF (GAUZE/BANDAGES/DRESSINGS) ×4 IMPLANT
TOWEL GREEN STERILE (TOWEL DISPOSABLE) ×4 IMPLANT
TOWEL GREEN STERILE FF (TOWEL DISPOSABLE) ×4 IMPLANT
TRAY FOLEY SLVR 16FR TEMP STAT (SET/KITS/TRAYS/PACK) ×4 IMPLANT
TUBING INSUFFLATION (TUBING) ×4 IMPLANT
UNDERPAD 30X30 (UNDERPADS AND DIAPERS) ×4 IMPLANT
VALVE AORTIC SZ23 INSP/RESIL (Prosthesis & Implant Heart) ×4 IMPLANT
VENT LEFT HEART 12002 (CATHETERS) ×4
WATER STERILE IRR 1000ML POUR (IV SOLUTION) ×8 IMPLANT

## 2017-12-29 NOTE — Progress Notes (Signed)
  Echocardiogram 2D Echocardiogram has been performed.  Matthew [name] 12/29/2017, 8:42 AM

## 2017-12-29 NOTE — Op Note (Signed)
CARDIOVASCULAR SURGERY OPERATIVE NOTE  12/29/2017  Surgeon:  [name], MD  First Assistant: Jadene Pierini,  PA-C   Preoperative Diagnosis:  Severe multi-vessel coronary artery disease   Postoperative Diagnosis:  Same   Procedure:  1. Median Sternotomy 2. Extracorporeal circulation 3.   Coronary artery bypass grafting x 4   Left internal mammary graft to the LAD  SVG to Ramus  Sequential SVG to OM1 and OM2  4.   Endoscopic vein harvest from the right leg 5.   Aortic valve replacement using a 23 mm INSPIRIS RESILIA PERICARDIAL VALVE   Anesthesia:  General Endotracheal   Clinical History/Surgical Indication:   This gentleman is a 72 year old with a history of hypertension, hyperlipidemia, prediabetes, stage III chronic kidney disease with a creatinine of 1.82, peripheral vascular disease status post left fem-tib bypass in 2017 by Dr. 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  The patient is also had bladder stones and prostate enlargement and has been followed by Dr. Lovena Neighbours.  He reportedly underwent cardiology clearance at the [facility] in [place] and then underwent TURP and Cystolithoapaxy by Dr. Lovena Neighbours on 12/16/2017.  He tolerated the procedure well but apparently could not urinate after his Foley was removed require replacement of the Foley catheter and irrigation for clot retention.  He then developed sharp right-sided chest pain which was initially rated at 8 out of 10.  Electrocardiogram showed inferolateral ST segment depression with T wave inversions that were similar to his prior EKG.  A troponin level was measured at 2.58.  He was seen in consultation by cardiology on 12/18/2017 and underwent a 2D echocardiogram which showed  severe aortic stenosis with a trileaflet aortic valve with a mean gradient of 49 mmHg.  Left ventricular ejection fraction was 60 to 65% with grade 2 diastolic dysfunction and moderate LVH.  His chest pain resolved and due to his elevated creatinine catheterization was not performed until today.  This shows 95% ostial left main stenosis as well as 95% ostial LAD stenosis.  The proximal to mid LAD had 75% stenosis.  The mid left circumflex had 60% stenosis before 2 marginal branches.  The right coronary was a small nondominant vessel with complete occlusion in the mid to distal vessel.  The mean aortic valve gradient was 42 mmHg.  PA pressure was 41/16 with an LVEDP of 22.  Mean wedge pressure was 16. It was felt that AVR and CABG was the best treatment for him. Preop carotid dopplers showed a 80-99% right ICA stenosis. He was seen by Dr. Trula Slade and it was felt that since he was asymptomatic he could safely have his carotid stenosis treated later after he recovered from heart surgery. Vein mapping showed that the right GSV was present and patent and probably suitable for bypass conduit although a little small.   I discussed the operative procedure with the patient and family including alternatives, benefits and risks; including but not limited to bleeding, blood transfusion, infection, stroke, myocardial infarction, graft failure, heart block requiring a permanent pacemaker, organ dysfunction, and death.  [name] understands and agrees to proceed.    Preparation:  The patient was seen in the preoperative holding area and the correct patient, correct operation were confirmed with the patient after reviewing the medical record and catheterization. The consent was signed by me. Preoperative antibiotics were given.  A pulmonary arterial line and radial arterial line were placed by the anesthesia team. The patient was taken back to the operating room and positioned supine on the operating room table. After  being placed under general endotracheal anesthesia by the anesthesia team a foley catheter was placed. The neck, chest, abdomen, and both legs were prepped with betadine soap and solution and draped in the usual sterile manner. A surgical time-out was taken and the correct patient and operative procedure were confirmed with the nursing and anesthesia staff.   Cardiopulmonary Bypass:  A median sternotomy was performed. The pericardium was opened in the midline. Right ventricular function appeared normal. The ascending aorta was of normal size and had calcified plaque present anteriorly in the mid to distal ascending aorta. There was a soft area distal to this in the proximal aortic arch that appeared adequate for cannulation.   The patient was fully systemically heparinized and the ACT was maintained > 400 sec. The proximal aortic arch was cannulated with a 20 F aortic cannula for arterial inflow. Venous cannulation was performed via the right atrial appendage using a two-staged venous cannula. An antegrade cardioplegia/vent cannula was inserted into the mid-ascending aorta. Aortic occlusion was performed with a single cross-clamp. Systemic cooling to 32 degrees Centigrade and topical cooling of the heart with iced saline were used. Hyperkalemic antegrade cold blood cardioplegia was used to induce diastolic arrest and was then given at about 20 minute intervals throughout the period of arrest to maintain myocardial temperature at or below 10 degrees centigrade. A temperature probe was inserted into the interventricular septum and an insulating pad was placed in the pericardium.   Left internal mammary harvest:  The left side of the sternum was retracted using the Rultract retractor. The left internal mammary artery was harvested as a pedicle graft. All side branches were clipped. It was a medium-sized vessel of good quality with excellent blood flow. It was ligated distally and divided. It was sprayed with  topical papaverine solution to prevent vasospasm.   Endoscopic vein harvest:  The right greater saphenous vein was harvested endoscopically through a 2 cm incision medial to the right knee. It was harvested from the upper thigh to below the knee. It was a small-medium-sized vein of good quality. The side branches were all ligated with 4-0 silk ties.    Coronary arteries:  The coronary arteries were examined.   LAD: intramyocardial throughout the proximal and mid portions. Distally the LAD was free of disease.  LCX:  Large Ramus and OM branches that had no distal disease.  RCA:  Non-dominant vessel. There was a tiny PDA branch present. Beyond the occlusion seen on cath the vessel was too small to graft and supplied by collaterals.   Grafts:  1. LIMA to the LAD: 2.5 mm. It was sewn end to side using 8-0 prolene continuous suture. 2. SVG to Ramus:  1.75 mm. It was sewn end to side using 7-0 prolene continuous suture. 3. Sequential SVG to OM1:  2.0 mm. It was sewn sequential side to side using 7-0 prolene continuous suture. 4. Sequential SVG to OM2:  2.0 mm. It was sewn sequential end to side using 7-0 prolene continuous suture.  The proximal vein graft anastomoses were performed to the mid-ascending aorta using continuous 6-0 prolene suture. Graft markers were placed around the proximal anastomoses.   Aortic Valve Replacement:   A transverse aortotomy was performed 1 cm above the take-off of the right coronary artery. The native valve was  tricuspid with calcified leaflets and severe annular calcification. The ostia of the coronary arteries were in normal position and were not obstructed. The native valve leaflets were excised and the annulus was decalcified with rongeurs. Care was taken to remove all particulate debris. The left ventricle was directly inspected for debris and then irrigated with ice saline solution. The annulus was sized and a size 23 mm Edwards INSPIRIS RESILIA  pericardial valve was chosen. The model number was [id number] and the serial number was [id number].  While the valve was being prepared 2-0 Ethibond pledgeted horizontal mattress sutures were placed around the annulus with the pledgets in a sub-annular position. The sutures were placed through the sewing ring and the valve lowered into place. The sutures were tied sequentially. The valve seated nicely and the coronary ostia were not obstructed. The prosthetic valve leaflets moved normally and there was no sub-valvular obstruction. The aortotomy was closed using 4-0 Prolene suture in 2 layers with felt strips to reinforce the closure.  Completion:  The patient was rewarmed to 37 degrees Centigrade. The clamp was removed from the LIMA pedicle and there was rapid warming of the septum and return of ventricular fibrillation. The crossclamp was removed with a time of 157 minutes. There was spontaneous return of sinus rhythm. The distal and proximal anastomoses were checked for hemostasis. The position of the grafts was satisfactory. Two temporary epicardial pacing wires were placed on the right atrium and two on the right ventricle. The patient was weaned from CPB without difficulty on dopamine 2 mcg which had been started at the beginning of bypass to augment renal blood flow. CPB time was 188 minutes. Cardiac output was 5 LPM. TEE showed a normally functioning aortic valve prosthesis with no AI or paravalvular leak. There was unchanged mild MR. Heparin was fully reversed with protamine and the aortic and venous cannulas removed. Hemostasis was achieved. Mediastinal and left pleural drainage tubes were placed. The sternum was closed with double #6 stainless steel wires. The fascia was closed with continuous # 1 vicryl suture. The subcutaneous tissue was closed with 2-0 vicryl continuous suture. The skin was closed with 3-0 vicryl subcuticular suture. All sponge, needle, and instrument counts were reported correct at the  end of the case. Dry sterile dressings were placed over the incisions and around the chest tubes which were connected to pleurevac suction. The patient was then transported to the surgical intensive care unit in stable condition.

## 2017-12-29 NOTE — Progress Notes (Signed)
Patient interviewed in the preop area. Patient able to confirm name, DOB, procedure, no allergies, metal in head, npo status and no pain.   Leatha Gilding, RN

## 2017-12-29 NOTE — Anesthesia Preprocedure Evaluation (Addendum)
Anesthesia Evaluation  Patient identified by MRN, date of birth, ID band Patient awake    Reviewed: Allergy & Precautions, NPO status , Patient's Chart, lab work & pertinent test results  History of Anesthesia Complications Negative for: history of anesthetic complications  Airway Mallampati: II  TM Distance: >3 FB Neck ROM: Full    Dental no notable dental hx. (+) Missing, Dental Advisory Given   Pulmonary Current Smoker,    Pulmonary exam normal breath sounds clear to auscultation       Cardiovascular hypertension, + CAD, + Past MI, + Cardiac Stents and + Peripheral Vascular Disease  Normal cardiovascular exam+ Valvular Problems/Murmurs (Moderate to Severe) AS  Rhythm:Regular Rate:Normal     Neuro/Psych PSYCHIATRIC DISORDERS Anxiety Neuropathy bil  negative psych ROS   GI/Hepatic negative GI ROS, Neg liver ROS,   Endo/Other  negative endocrine ROS  Renal/GU Renal InsufficiencyRenal disease  negative genitourinary   Musculoskeletal negative musculoskeletal ROS (+)   Abdominal   Peds negative pediatric ROS (+)  Hematology negative hematology ROS (+)   Anesthesia Other Findings   Reproductive/Obstetrics negative OB ROS                             Anesthesia Physical  Anesthesia Plan  ASA: IV  Anesthesia Plan: General   Post-op Pain Management:    Induction: Intravenous  PONV Risk Score and Plan: 2 and Ondansetron, Treatment may vary due to age or medical condition and Dexamethasone  Airway Management Planned: Oral ETT  Additional Equipment: Arterial line, PA Cath, 3D TEE and Ultrasound Guidance Line Placement  Intra-op Plan:   Post-operative Plan: Post-operative intubation/ventilation  Informed Consent: I have reviewed the patients History and Physical, chart, labs and discussed the procedure including the risks, benefits and alternatives for the proposed anesthesia with  the patient or authorized representative who has indicated his/her understanding and acceptance.   Dental advisory given  Plan Discussed with: CRNA, Anesthesiologist and Surgeon  Anesthesia Plan Comments:        Anesthesia Quick Evaluation

## 2017-12-29 NOTE — Brief Op Note (Signed)
12/16/2017 - 12/29/2017  1:45 PM  PATIENT:  Matthew [name]  72 y.o. male  PRE-OPERATIVE DIAGNOSIS:  CAD SEVERE AS  POST-OPERATIVE DIAGNOSIS:  * No post-op diagnosis entered *  PROCEDURE:  Procedure(s): CORONARY ARTERY BYPASS GRAFTING (CABG) x4, LIMA to LAD, SVG to OM1 , SVG to OM2, SVG to Ramus. ENDOSCOPIC SAPHENOUS VEIN HARVEST. (N/A) AORTIC VALVE REPLACEMENT (AVR) 78mm INSPIRIS BY EDWARDS LIFESCIENCE. (N/A) TRANSESOPHAGEAL ECHOCARDIOGRAM (TEE) (N/A)  SURGEON:  Surgeon(s) and Role:    * Bartle, Fernande Boyden, MD - Primary  PHYSICIAN ASSISTANT: Haydon Dorris PA-C  ANESTHESIA:   general  EBL:  1200 mL   BLOOD ADMINISTERED:  DRAINS: PLEURAL AND PERICARDIAL CHEST TUBES   LOCAL MEDICATIONS USED:  NONE  SPECIMEN:  Source of Specimen:  AORTIC VALVE LEAFLETS  DISPOSITION OF SPECIMEN:  PATHOLOGY  COUNTS:  YES  TOURNIQUET:  * No tourniquets in log *  DICTATION: .Dragon Dictation  PLAN OF CARE: Admit to inpatient   PATIENT DISPOSITION:  ICU - intubated and hemodynamically stable.   Delay start of Pharmacological VTE agent (>24hrs) due to surgical blood loss or risk of bleeding: yes

## 2017-12-29 NOTE — Anesthesia Procedure Notes (Addendum)
Arterial Line Insertion Start/End8/27/2019 6:50 AM, 12/29/2017 7:00 AM Performed by: Carney Living, CRNA, CRNA  Preanesthetic checklist: patient identified, IV checked, site marked, risks and benefits discussed, surgical consent, monitors and equipment checked, pre-op evaluation and timeout performed Lidocaine 1% used for infiltration and patient sedated Left, radial was placed Hand hygiene performed , maximum sterile barriers used  and Seldinger technique used Allen's test indicative of satisfactory collateral circulation Attempts: 2 Procedure performed without using ultrasound guided technique. Following insertion, dressing applied and Biopatch. Post procedure assessment: normal  Patient tolerated the procedure well with no immediate complications.

## 2017-12-29 NOTE — Anesthesia Procedure Notes (Signed)
Central Venous Catheter Insertion Performed by: Belinda Block, MD, anesthesiologist Start/End8/27/2019 7:00 AM, 12/29/2017 7:15 AM Preanesthetic checklist: patient identified, IV checked, site marked, risks and benefits discussed, surgical consent, monitors and equipment checked, pre-op evaluation and timeout performed Position: Trendelenburg Lidocaine 1% used for infiltration and patient sedated Hand hygiene performed , maximum sterile barriers used  and Seldinger technique used PA cath was placed.Sheath introducer Swan type:thermodilution Procedure performed using ultrasound guided technique. Ultrasound Notes:anatomy identified Attempts: 1 Following insertion, line sutured, dressing applied and Biopatch. Post procedure assessment: blood return through all ports  Patient tolerated the procedure well with no immediate complications.

## 2017-12-29 NOTE — Transfer of Care (Signed)
Immediate Anesthesia Transfer of Care Note  Patient: SAMYAK SACKMANN  Procedure(s) Performed: CORONARY ARTERY BYPASS GRAFTING (CABG) x4, LIMA to LAD, SVG to OM1 , SVG to OM2, SVG to Ramus. ENDOSCOPIC SAPHENOUS VEIN HARVEST. (N/A Chest) AORTIC VALVE REPLACEMENT (AVR) 60mm INSPIRIS BY EDWARDS LIFESCIENCE. (N/A Chest) TRANSESOPHAGEAL ECHOCARDIOGRAM (TEE) (N/A )  Patient Location: SICU  Anesthesia Type:General  Level of Consciousness: sedated and Patient remains intubated per anesthesia plan  Airway & Oxygen Therapy: Patient remains intubated per anesthesia plan and Patient placed on Ventilator (see vital sign flow sheet for setting)  Post-op Assessment: Report given to RN and Post -op Vital signs reviewed and stable  Post vital signs: Reviewed and stable  Last Vitals:  Vitals Value Taken Time  BP 91/51 12/29/2017  2:49 PM  Temp 35.9 C 12/29/2017  2:50 PM  Pulse 76 12/29/2017  2:50 PM  Resp 12 12/29/2017  2:50 PM  SpO2 100 % 12/29/2017  2:50 PM  Vitals shown include unvalidated device data.  Last Pain:  Vitals:   12/29/17 0400  TempSrc:   PainSc: Asleep      Patients Stated Pain Goal: 0 (25/36/64 [id number])  Complications: No apparent anesthesia complications

## 2017-12-29 NOTE — Anesthesia Postprocedure Evaluation (Signed)
Anesthesia Post Note  [name]  Procedure(s) Performed: CORONARY ARTERY BYPASS GRAFTING (CABG) x4, LIMA to LAD, SVG to OM1 , SVG to OM2, SVG to Ramus. ENDOSCOPIC SAPHENOUS VEIN HARVEST. (N/A Chest) AORTIC VALVE REPLACEMENT (AVR) 99mm INSPIRIS BY EDWARDS LIFESCIENCE. (N/A Chest) TRANSESOPHAGEAL ECHOCARDIOGRAM (TEE) (N/A )     Patient location during evaluation: SICU Anesthesia Type: General Level of consciousness: sedated Pain management: pain level controlled Vital Signs Assessment: post-procedure vital signs reviewed and stable Respiratory status: patient remains intubated per anesthesia plan Cardiovascular status: stable Postop Assessment: no apparent nausea or vomiting Anesthetic complications: no    Last Vitals:  Vitals:   12/29/17 0600 12/29/17 1449  BP: (!) 144/66 (!) 91/51  Pulse: 61 85  Resp: 13 12  Temp:    SpO2: 100% 98%    Last Pain:  Vitals:   12/29/17 0400  TempSrc:   PainSc: [place]

## 2017-12-29 NOTE — Anesthesia Procedure Notes (Signed)
Procedure Name: Intubation Date/Time: 12/29/2017 7:59 AM Performed by: Carney Living, CRNA Pre-anesthesia Checklist: Patient identified, Suction available, Emergency Drugs available, Patient being monitored and Timeout performed Patient Re-evaluated:Patient Re-evaluated prior to induction Oxygen Delivery Method: Circle system utilized Preoxygenation: Pre-oxygenation with 100% oxygen Induction Type: IV induction Ventilation: Mask ventilation without difficulty and Oral airway inserted - appropriate to patient size Laryngoscope Size: Mac and 4 Grade View: Grade I Tube type: Oral Tube size: 8.0 mm Number of attempts: 1 Airway Equipment and Method: Oral airway and Stylet Placement Confirmation: ETT inserted through vocal cords under direct vision,  positive ETCO2,  CO2 detector and breath sounds checked- equal and bilateral Secured at: 23 cm Tube secured with: Tape Dental Injury: Teeth and Oropharynx as per pre-operative assessment  Comments: ETT placed by Violet Baldy

## 2017-12-29 NOTE — Progress Notes (Signed)
Post surgical ABG obtained, Dr Cyndia Bent made aware of results. New orders for Bicarb pushes x 2.

## 2017-12-29 NOTE — Progress Notes (Signed)
TCTS BRIEF SICU PROGRESS NOTE  Day of Surgery  S/P Procedure(s) (LRB): CORONARY ARTERY BYPASS GRAFTING (CABG) x4, LIMA to LAD, SVG to OM1 , SVG to OM2, SVG to Ramus. ENDOSCOPIC SAPHENOUS VEIN HARVEST. (N/A) AORTIC VALVE REPLACEMENT (AVR) 63mm INSPIRIS BY EDWARDS LIFESCIENCE. (N/A) TRANSESOPHAGEAL ECHOCARDIOGRAM (TEE) (N/A)   Sedated on vent NSR w/ somewhat labile BP and low filling pressures, on SNP drip for hypertension, currently receiving blood transfusion O2 sats 100% on 50% FiO2 Chest tube output trending down Excellent UOP Labs okay w/ expected acute blood loss anemia, coags okay  Plan: Continue to monitor chest tube output closely and replace blood products as needed  Rexene Alberts, MD 12/29/2017 8:06 PM

## 2017-12-30 ENCOUNTER — Encounter (HOSPITAL_COMMUNITY): Payer: Self-pay | Admitting: Surgery

## 2017-12-30 ENCOUNTER — Inpatient Hospital Stay (HOSPITAL_COMMUNITY): Payer: No Typology Code available for payment source

## 2017-12-30 DIAGNOSIS — Z952 Presence of prosthetic heart valve: Secondary | ICD-10-CM

## 2017-12-30 DIAGNOSIS — Z951 Presence of aortocoronary bypass graft: Secondary | ICD-10-CM

## 2017-12-30 LAB — POCT I-STAT, CHEM 8
BUN: 26 mg/dL — ABNORMAL HIGH (ref 8–23)
BUN: 22 mg/dL (ref 8–23)
CALCIUM ION: 1.2 mmol/L (ref 1.15–1.40)
CREATININE: 1.8 mg/dL — AB (ref 0.61–1.24)
Calcium, Ion: 1.23 mmol/L (ref 1.15–1.40)
Chloride: 108 mmol/L (ref 98–111)
Chloride: 107 mmol/L (ref 98–111)
Creatinine, Ser: 1.9 mg/dL — ABNORMAL HIGH (ref 0.61–1.24)
GLUCOSE: 144 mg/dL — AB (ref 70–99)
Glucose, Bld: 102 mg/dL — ABNORMAL HIGH (ref 70–99)
HCT: 34 % — ABNORMAL LOW (ref 39.0–52.0)
HEMATOCRIT: 30 % — AB (ref 39.0–52.0)
HEMOGLOBIN: 11.6 g/dL — AB (ref 13.0–17.0)
Hemoglobin: 10.2 g/dL — ABNORMAL LOW (ref 13.0–17.0)
Potassium: 5.6 mmol/L — ABNORMAL HIGH (ref 3.5–5.1)
Potassium: 4.4 mmol/L (ref 3.5–5.1)
Sodium: 138 mmol/L (ref 135–145)
Sodium: 139 mmol/L (ref 135–145)
TCO2: 21 mmol/L — AB (ref 22–32)
TCO2: 20 mmol/L — AB (ref 22–32)

## 2017-12-30 LAB — TYPE AND SCREEN
ABO/RH(D): AB NEG
ANTIBODY SCREEN: NEGATIVE
UNIT DIVISION: 0
UNIT DIVISION: 0
UNIT DIVISION: 0
UNIT DIVISION: 0
Unit division: 0
Unit division: 0
Unit division: 0
Unit division: 0

## 2017-12-30 LAB — POCT I-STAT 3, ART BLOOD GAS (G3+)
ACID-BASE DEFICIT: 4 mmol/L — AB (ref 0.0–2.0)
Acid-base deficit: 6 mmol/L — ABNORMAL HIGH (ref 0.0–2.0)
Bicarbonate: 21.1 mmol/L (ref 20.0–28.0)
Bicarbonate: 19.6 mmol/L — ABNORMAL LOW (ref 20.0–28.0)
O2 SAT: 98 %
O2 SAT: 98 %
PCO2 ART: 34.3 mmHg (ref 32.0–48.0)
PH ART: 7.369 (ref 7.350–7.450)
Patient temperature: 35.5
TCO2: 22 mmol/L (ref 22–32)
TCO2: 21 mmol/L — AB (ref 22–32)
pCO2 arterial: 36.1 mmHg (ref 32.0–48.0)
pH, Arterial: 7.36 (ref 7.350–7.450)
pO2, Arterial: 98 mmHg (ref 83.0–108.0)
pO2, Arterial: 97 mmHg (ref 83.0–108.0)

## 2017-12-30 LAB — GLUCOSE, CAPILLARY
GLUCOSE-CAPILLARY: 145 mg/dL — AB (ref 70–99)
GLUCOSE-CAPILLARY: 153 mg/dL — AB (ref 70–99)
Glucose-Capillary: 137 mg/dL — ABNORMAL HIGH (ref 70–99)
Glucose-Capillary: 105 mg/dL — ABNORMAL HIGH (ref 70–99)
Glucose-Capillary: 99 mg/dL (ref 70–99)
Glucose-Capillary: 120 mg/dL — ABNORMAL HIGH (ref 70–99)

## 2017-12-30 LAB — BASIC METABOLIC PANEL
Anion gap: 7 (ref 5–15)
BUN: 25 mg/dL — AB (ref 8–23)
CHLORIDE: 110 mmol/L (ref 98–111)
CO2: 22 mmol/L (ref 22–32)
CREATININE: 1.9 mg/dL — AB (ref 0.61–1.24)
Calcium: 8.5 mg/dL — ABNORMAL LOW (ref 8.9–10.3)
GFR calc Af Amer: 39 mL/min — ABNORMAL LOW (ref >60)
GFR calc non Af Amer: 33 mL/min — ABNORMAL LOW (ref >60)
GLUCOSE: 128 mg/dL — AB (ref 70–99)
Potassium: 4.9 mmol/L (ref 3.5–5.1)
SODIUM: 139 mmol/L (ref 135–145)

## 2017-12-30 LAB — CBC
HCT: 33.4 % — ABNORMAL LOW (ref 39.0–52.0)
HEMATOCRIT: 32.7 % — AB (ref 39.0–52.0)
HEMOGLOBIN: 11 g/dL — AB (ref 13.0–17.0)
Hemoglobin: 11.2 g/dL — ABNORMAL LOW (ref 13.0–17.0)
MCH: 30 pg (ref 26.0–34.0)
MCH: 30.1 pg (ref 26.0–34.0)
MCHC: 33.5 g/dL (ref 30.0–36.0)
MCHC: 33.6 g/dL (ref 30.0–36.0)
MCV: 89.5 fL (ref 78.0–100.0)
MCV: 89.6 fL (ref 78.0–100.0)
PLATELETS: 120 10*3/uL — AB (ref 150–400)
Platelets: 110 10*3/uL — ABNORMAL LOW (ref 150–400)
RBC: 3.73 MIL/uL — ABNORMAL LOW (ref 4.22–5.81)
RBC: 3.65 MIL/uL — ABNORMAL LOW (ref 4.22–5.81)
RDW: 14.6 % (ref 11.5–15.5)
RDW: 14.6 % (ref 11.5–15.5)
WBC: 13.1 10*3/uL — ABNORMAL HIGH (ref 4.0–10.5)
WBC: 13.2 10*3/uL — AB (ref 4.0–10.5)

## 2017-12-30 LAB — BPAM RBC
BLOOD PRODUCT EXPIRATION DATE: 201909012359
BLOOD PRODUCT EXPIRATION DATE: 201909072359
BLOOD PRODUCT EXPIRATION DATE: 201909102359
BLOOD PRODUCT EXPIRATION DATE: 201909132359
Blood Product Expiration Date: 201909042359
Blood Product Expiration Date: 201909092359
Blood Product Expiration Date: 201909112359
Blood Product Expiration Date: 201909132359
ISSUE DATE / TIME: 201908270801
ISSUE DATE / TIME: 201908270801
ISSUE DATE / TIME: 201908270801
ISSUE DATE / TIME: 201908271523
ISSUE DATE / TIME: 201908271935
ISSUE DATE / TIME: 201908270801
UNIT TYPE AND RH: 600
UNIT TYPE AND RH: 600
UNIT TYPE AND RH: 600
UNIT TYPE AND RH: 600
UNIT TYPE AND RH: 600
Unit Type and Rh: 600
Unit Type and Rh: 600
Unit Type and Rh: 600

## 2017-12-30 LAB — CREATININE, SERUM
Creatinine, Ser: 1.81 mg/dL — ABNORMAL HIGH (ref 0.61–1.24)
GFR, EST AFRICAN AMERICAN: 41 mL/min — AB (ref >60)
GFR, EST NON AFRICAN AMERICAN: 35 mL/min — AB (ref >60)

## 2017-12-30 LAB — MAGNESIUM
MAGNESIUM: 2 mg/dL (ref 1.7–2.4)
MAGNESIUM: 2 mg/dL (ref 1.7–2.4)

## 2017-12-30 MED ORDER — HYDRALAZINE HCL 20 MG/ML IJ SOLN
10.0000 mg | INTRAMUSCULAR | Status: DC | PRN
  Administered 2017-12-30 – 2018-01-01 (×4): 10 mg via INTRAVENOUS
  Filled 2017-12-30 (×4): qty 1

## 2017-12-30 MED ORDER — AMLODIPINE BESYLATE 10 MG PO TABS
10.0000 mg | ORAL_TABLET | Freq: Every day | ORAL | Status: DC
  Administered 2017-12-30 – 2018-01-03 (×5): 10 mg via ORAL
  Filled 2017-12-30 (×6): qty 1

## 2017-12-30 MED ORDER — INSULIN ASPART 100 UNIT/ML ~~LOC~~ SOLN: 0.0000 [IU] | SUBCUTANEOUS | Status: DC

## 2017-12-30 MED ORDER — METOPROLOL TARTRATE 50 MG PO TABS
50.0000 mg | ORAL_TABLET | Freq: Two times a day (BID) | ORAL | Status: DC
  Administered 2017-12-30: 50 mg via ORAL
  Filled 2017-12-30: qty 1

## 2017-12-30 MED FILL — Thrombin For Soln Kit 20000 Unit: CUTANEOUS | Qty: 1 | Status: AC

## 2017-12-30 NOTE — Progress Notes (Signed)
RT NOTE:  Cardiac Rapid Wean initiated. Pt follows commands.

## 2017-12-30 NOTE — Progress Notes (Signed)
EKG CRITICAL VALUE     12 lead EKG performed.  Critical value noted.  Rock Nephew, RN notified.   Cy Bresee, CCT 12/30/2017 7:23 AM

## 2017-12-30 NOTE — Progress Notes (Signed)
Progress Note  Patient Name: DEWITTE VANNICE Date of Encounter: 12/30/2017  Primary Cardiologist: Stanford Breed may f/u in [place] and sedated   Inpatient Medications    Scheduled Meds: . sodium chloride   Intravenous Once  . acetaminophen  1,000 mg Oral Q6H   Or  . acetaminophen (TYLENOL) oral liquid 160 mg/5 mL  1,000 mg Per Tube Q6H  . aspirin EC  325 mg Oral Daily   Or  . aspirin  324 mg Per Tube Daily  . bisacodyl  10 mg Oral Daily   Or  . bisacodyl  10 mg Rectal Daily  . chlorhexidine gluconate (MEDLINE KIT)  15 mL Mouth Rinse BID  . Chlorhexidine Gluconate Cloth  6 each Topical Daily  . docusate sodium  200 mg Oral Daily  . insulin aspart  0-24 Units Subcutaneous Q4H  . latanoprost  1 drop Both Eyes QHS  . mouth rinse  15 mL Mouth Rinse 10 times per day  . oxybutynin  10 mg Oral Daily  . [START ON 12/31/2017] pantoprazole  40 mg Oral Daily  . sodium chloride flush  10-40 mL Intracatheter Q12H  . sodium chloride flush  3 mL Intravenous Q12H   Continuous Infusions: . sodium chloride 10 mL/hr at 12/30/17 0600  . sodium chloride    . sodium chloride Stopped (12/29/17 2247)  . cefUROXime (ZINACEF)  IV Stopped (12/30/17 0452)  . lactated ringers    . lactated ringers Stopped (12/29/17 1540)  . nitroGLYCERIN    . nitroPRUSSide 1.2 mcg/kg/min (12/30/17 1287)  . phenylephrine (NEO-SYNEPHRINE) Adult infusion     PRN Meds: sodium chloride, metoprolol tartrate, morphine injection, ondansetron (ZOFRAN) IV, oxybutynin, oxyCODONE, pentosan polysulfate, sodium chloride flush, traMADol   Vital Signs    Vitals:   12/30/17 0600 12/30/17 0616 12/30/17 0745 12/30/17 0802  BP: 112/75 (!) 127/57  (!) 162/69  Pulse: 84 85  85  Resp: 12 14  (!) 24  Temp: (!) 95.5 F (35.3 C)     TempSrc:      SpO2: 100% 100% 100% 99%  Weight:      Height:        Intake/Output Summary (Last 24 hours) at 12/30/2017 0805 Last data filed at 12/30/2017 0600 Gross per 24 hour   Intake 13268.3 ml  Output 10860 ml  Net 2408.3 ml   Filed Weights   12/22/17 0338 12/29/17 0524 12/30/17 0345  Weight: 84.3 kg 86.3 kg 92.7 kg    Telemetry    Sinus rhythm 12/30/2017 - Personally Reviewed  ECG    SR LVH inferolateral T wave inversions   Physical Exam  BP (!) 162/69 Comment: aline pressure  Pulse 85   Temp (!) 95.5 F (35.3 C)   Resp (!) 24   Ht 5' 8"  (1.727 m)   Wt 92.7 kg   SpO2 99%   BMI 31.07 kg/m  Sedated  Elderly black male  Intubated  Neck central lines  JVP normal no bruits no thyromegaly Lungs clear with no wheezing and good diaphragmatic motion Heart:  SEM AVR no AR  Post sternotomy  Abdomen: benighn, BS positve, no tenderness, no AAA no bruit.  No HSM or HJR Distal pulses intact with no bruits No edema Neuro non-focal Skin warm and dry No muscular weakness   Labs    Chemistry Recent Labs  Lab 12/28/17 0300 12/29/17 0439  12/29/17 1304 12/29/17 1340 12/29/17 1452 12/29/17 2255 12/30/17 0423  NA 137 139   < >  135 137 139  --  139  K 4.4 4.8   < > 6.5* 5.2* 4.9  --  4.9  CL 105 108   < > 108 107  --   --  110  CO2 24 22  --   --   --   --   --  22  GLUCOSE 82 91   < > 175* 141* 97  --  128*  BUN 30* 29*   < > 27* 25*  --   --  25*  CREATININE 2.45* 2.30*   < > 2.00* 1.70*  --  1.97* 1.90*  CALCIUM 8.4* 8.8*  --   --   --   --   --  8.5*  PROT  --  5.7*  --   --   --   --   --   --   ALBUMIN  --  3.0*  --   --   --   --   --   --   AST  --  17  --   --   --   --   --   --   ALT  --  22  --   --   --   --   --   --   ALKPHOS  --  40  --   --   --   --   --   --   BILITOT  --  0.6  --   --   --   --   --   --   GFRNONAA 25* 27*  --   --   --   --  32* 33*  GFRAA 28* 31*  --   --   --   --  37* 39*  ANIONGAP 8 9  --   --   --   --   --  7   < > = values in this interval not displayed.     Hematology Recent Labs  Lab 12/29/17 1451 12/29/17 1452 12/29/17 2255 12/30/17 0423  WBC 14.7*  --  15.2* 13.1*  RBC 3.06*   --  3.78* 3.73*  HGB 9.3* 8.5* 11.5* 11.2*  HCT 28.1* 25.0* 34.0* 33.4*  MCV 91.8  --  89.9 89.5  MCH 30.4  --  30.4 30.0  MCHC 33.1  --  33.8 33.5  RDW 13.7  --  14.3 14.6  PLT 88*  --  118* 120*    Cardiac EnzymesNo results for input(s): TROPONINI in the last 168 hours. No results for input(s): TROPIPOC in the last 168 hours.   BNPNo results for input(s): BNP, PROBNP in the last 168 hours.   DDimer No results for input(s): DDIMER in the last 168 hours.   Radiology    Dg Chest Port 1 View  Result Date: 12/29/2017 CLINICAL DATA:  72 year old male postoperative day zero status post CABG. EXAM: PORTABLE CHEST 1 VIEW COMPARISON:  Portable chest 12/18/2017 and earlier. FINDINGS: Portable AP supine view at 1507 hours. Endotracheal tube tip in good position just below the clavicles. Enteric tube courses to the abdomen, tip not included. 2 left chest tubes and mediastinal tube are in place. Right IJ approach Swan-Ganz catheter tip is at the main pulmonary artery level. Mildly low lung volumes. Mediastinal contours are stable. Sequelae of CABG. No pneumothorax, pulmonary edema or pleural effusion. IMPRESSION: 1. Lines and tubes appear appropriately placed as above. 2.  No acute cardiopulmonary abnormality. Electronically Signed   By:  Genevie Ann M.D.   On: 12/29/2017 15:35    Cardiac Studies   ECHO 12/18/2017 - Left ventricle: The cavity size was normal. Wall thickness was increased in a pattern of moderate LVH. Systolic function was normal. The estimated ejection fraction was in the range of 60% to 65%. Wall motion was normal; there were no regional wall motion abnormalities. Features are consistent with a pseudonormal left ventricular filling pattern, with concomitant abnormal relaxation and increased filling pressure (grade 2 diastolic dysfunction). - Aortic valve: Trileaflet; severely calcified leaflets. There was severe stenosis. There was trivial regurgitation. Mean  gradient (S): 49 mm Hg. Valve area (VTI): 0.6 cm^2. - Mitral valve: Mildly calcified annulus. There was mild regurgitation. - Right ventricle: The cavity size was normal. Systolic function was normal. - Pulmonary arteries: No complete TR doppler jet so unable to estimate PA systolic pressure. - Systemic veins: IVC measured 2.0 cm with < 50% respirophasic variation, suggesting RA pressure 8 mmHg.  Impressions:  - Normal LV size with moderate LV hypertrophy. EF 60-65%. Normal RV size and systolic function. Mild mitral regurgitation. Severe aortic stenosis.  CATH 12/22/2017  Ost LM lesion is 95% stenosed.  Prox Cx to Mid Cx lesion is 60% stenosed.  Ost LAD lesion is 95% stenosed.  Prox LAD to Mid LAD lesion is 75% stenosed.  Mid RCA to Dist RCA lesion is 100% stenosed.   Patient Profile     72 y.o. male with severe CAD and severe AS, s/p small NSTEMI soon after urological surgery, developing hematuria about 7 days postop while on IV heparin. CABG and AVR 12/29/17  Assessment & Plan    CABG/AVR:  On nipride for HTN. Underlying rhythm sinus bradycardia now pacing Bladder irrigation per Urology will need post op baseline echo in 6 weeks. CRF with Cr actually improving post op Anemia improved Post op with transfusion.        Signed, Jenkins Rouge, MD  12/30/2017, 8:05 AM

## 2017-12-30 NOTE — Progress Notes (Signed)
1 Day Post-Op Subjective: The patient has recently been extubated.  He is awake and responsive, but still weak.  No complaints when questioned. No issues with hematuria last night, per the nursing staff.  Objective: Vital signs in last 24 hours: Temp:  [95.5 F (35.3 C)-97.7 F (36.5 C)] 95.9 F (35.5 C) (08/28 0800) Pulse Rate:  [59-86] 85 (08/28 0802) Resp:  [12-30] 24 (08/28 0802) BP: (91-174)/(51-96) 162/69 (08/28 0802) SpO2:  [98 %-100 %] 99 % (08/28 0802) FiO2 (%):  [40 %-50 %] 40 % (08/28 0745) Weight:  [92.7 kg] 92.7 kg (08/28 0345)  Intake/Output from previous day: 08/27 0701 - 08/28 0700 In: 13268.3 [I.V.:4475.5; Blood:1300.3; NG/GT:90; IV Piggyback:802.5] Out: 47096 [Urine:8800; Emesis/NG output:150; Blood:1200; Chest Tube:710]  Intake/Output this shift: Total I/O In: 181.8 [I.V.:81.8; Other:100] Out: 270 [Urine:250; Chest Tube:20]  Physical Exam:  GU: Foley draining clear-yellow urine on a very slow CBI drip.   Lab Results: Recent Labs    12/29/17 1452 12/29/17 2255 12/30/17 0423  HGB 8.5* 11.5* 11.2*  HCT 25.0* 34.0* 33.4*   BMET Recent Labs    12/29/17 0439  12/29/17 1340 12/29/17 1452 12/29/17 2255 12/30/17 0423  NA 139   < > 137 139  --  139  K 4.8   < > 5.2* 4.9  --  4.9  CL 108   < > 107  --   --  110  CO2 22  --   --   --   --  22  GLUCOSE 91   < > 141* 97  --  128*  BUN 29*   < > 25*  --   --  25*  CREATININE 2.30*   < > 1.70*  --  1.97* 1.90*  CALCIUM 8.8*  --   --   --   --  8.5*   < > = values in this interval not displayed.     Studies/Results: Dg Chest Port 1 View  Result Date: 12/29/2017 CLINICAL DATA:  72 year old male postoperative day zero status post CABG. EXAM: PORTABLE CHEST 1 VIEW COMPARISON:  Portable chest 12/18/2017 and earlier. FINDINGS: Portable AP supine view at 1507 hours. Endotracheal tube tip in good position just below the clavicles. Enteric tube courses to the abdomen, tip not included. 2 left chest tubes and  mediastinal tube are in place. Right IJ approach Swan-Ganz catheter tip is at the main pulmonary artery level. Mildly low lung volumes. Mediastinal contours are stable. Sequelae of CABG. No pneumothorax, pulmonary edema or pleural effusion. IMPRESSION: 1. Lines and tubes appear appropriately placed as above. 2.  No acute cardiopulmonary abnormality. Electronically Signed   By: Genevie Ann M.D.   On: 12/29/2017 15:35    Assessment/Plan: 1 - Enlarged Prostate With Bladder Stones, Hematuria- s/p TURP and cystolithalopaxyon 12/16/17. Urine clear-yellow on a slow CBI gtt.  Keep Foley in place until he is more mobile. Will continue to closely monitor  2 - Moderate Risk Prostate Cancer - new DX, given his comorbidity, surveillance may be most reasonable options.   3 - Stage 3 Renal Insufficiency- appears to bemedical renal disease. IVF per cardiology   4 - NSTEMI- greatly appreciate cardiology and CT surgery.s/p 4 vessel CABG and AVR on 8/27.  Extubated this AM and is stable.   5. Right renal lesion with features concerning for RCC-The mass is being followed by Dr. Lenis Dickinson with the [facility]. He has a repeat CT scheduled for September.   LOS: 12 days   Emerald Shor,  MD [facility] Pager: [phone]  12/30/2017, 8:36 AM

## 2017-12-30 NOTE — Progress Notes (Signed)
1 Day Post-Op Procedure(s) (LRB): CORONARY ARTERY BYPASS GRAFTING (CABG) x4, LIMA to LAD, SVG to OM1 , SVG to OM2, SVG to Ramus. ENDOSCOPIC SAPHENOUS VEIN HARVEST. (N/A) AORTIC VALVE REPLACEMENT (AVR) 16mm INSPIRIS BY [employer]WARDS [employer]IFESCIENCE. (N/A) TRANSESOPHAGEAL ECHOCARDIOGRAM (TEE) (N/A) Subjective: No complaints  Has been hypertensive postop requiring nipride.  Weaned vent overnight and good weaning parameters this am so will extubate.  Objective: Vital signs in last 24 hours: Temp:  [95.5 F (35.3 C)-97.7 F (36.5 C)] 95.5 F (35.3 C) (08/28 0600) Pulse Rate:  [59-86] 85 (08/28 0616) Cardiac Rhythm: Atrial paced (08/28 0000) Resp:  [12-30] 14 (08/28 0616) BP: (91-174)/(51-96) 127/57 (08/28 0616) SpO2:  [98 %-100 %] 100 % (08/28 0616) FiO2 (%):  [40 %-50 %] 40 % (08/28 0640) Weight:  [92.7 kg] 92.7 kg (08/28 0345)  Hemodynamic parameters for last 24 hours: PAP: (10-28)/(4-19) 14/4 CO:  [3.1 L/min-4.2 L/min] 4.2 L/min CI:  [1.6 L/min/m2-2.1 L/min/m2] 2.1 L/min/m2  Intake/Output from previous day: 08/27 0701 - 08/28 0700 In: 13268.3 [I.V.:4475.5; Blood:1300.3; NG/GT:90; IV Piggyback:802.5] Out: 38101 [Urine:8800; Emesis/NG output:150; Blood:1200; Chest Tube:710] Intake/Output this shift: No intake/output data recorded.  General appearance: alert and cooperative Neurologic: intact Heart: regular rate and rhythm, S1, S2 normal, no murmur, click, rub or gallop Lungs: clear to auscultation bilaterally Extremities: edema mild Wound: dressings dry urine clear. bladder irrigation ongoing.  Lab Results: Recent Labs    12/29/17 2255 12/30/17 0423  WBC 15.2* 13.1*  HGB 11.5* 11.2*  HCT 34.0* 33.4*  PLT 118* 120*   BMET:  Recent Labs    12/29/17 0439  12/29/17 1340 12/29/17 1452 12/29/17 2255 12/30/17 0423  NA 139   < > 137 139  --  139  K 4.8   < > 5.2* 4.9  --  4.9  CL 108   < > 107  --   --  110  CO2 22  --   --   --   --  22  GLUCOSE 91   < > 141* 97  --   128*  BUN 29*   < > 25*  --   --  25*  CREATININE 2.30*   < > 1.70*  --  1.97* 1.90*  CALCIUM 8.8*  --   --   --   --  8.5*   < > = values in this interval not displayed.    PT/INR:  Recent Labs    12/29/17 1451  LABPROT 18.1*  INR 1.52   ABG    Component Value Date/Time   PHART 7.369 12/30/2017 0707   HCO3 21.1 12/30/2017 0707   TCO2 22 12/30/2017 0707   ACIDBASEDEF 4.0 (H) 12/30/2017 0707   O2SAT 98.0 12/30/2017 0707   CBG (last 3)  Recent Labs    12/29/17 2033 12/29/17 2348 12/30/17 0430  GLUCAP 122* 153* 137*   CXR: ok  ECG: sinus brady. Mild anterior ST elevation.  Assessment/Plan: S/P Procedure(s) (LRB): CORONARY ARTERY BYPASS GRAFTING (CABG) x4, LIMA to LAD, SVG to OM1 , SVG to OM2, SVG to Ramus. ENDOSCOPIC SAPHENOUS VEIN HARVEST. (N/A) AORTIC VALVE REPLACEMENT (AVR) 59mm INSPIRIS BY [employer]AR[employer]IFES[employer]. (N/A) TRANSESOPHAGEAL ECHOCARDIOGRAM (TEE) (N/A)  POD 1   Hemodynamically stable this am on nipride for HTN. Wean as tolerated once extubated. Sinus brady under pacer so will hold off on beta blocker.  ABG and weaning parameters good this am so will extubate.  Stage III CKD: creat at baseline and lower than preop. Follow. Bladder irrigation per urology.  Glucose under good control and preop Hgb A1c was 5.2. Continue SSI today.  High grade right ICA stenosis: neuro intact postop.   LOS: 12 days    Matthew [name] 12/30/2017

## 2017-12-30 NOTE — Progress Notes (Signed)
Patient ID: Matthew [name], male   DOB: 11-25-1945, 72 y.o.   MRN: [id number] EVENING ROUNDS NOTE :     [address]       [place] [address]             [phone]                 1 Day Post-Op Procedure(s) (LRB): CORONARY ARTERY BYPASS GRAFTING (CABG) x4, LIMA to LAD, SVG to OM1 , SVG to OM2, SVG to Ramus. ENDOSCOPIC SAPHENOUS VEIN HARVEST. (N/A) AORTIC VALVE REPLACEMENT (AVR) 52mm INSPIRIS BY [employer]WAR[employer]IFESCIENCE. (N/A) TRANSESOPHAGEAL ECHOCARDIOGRAM (TEE) (N/A)  Total Length of Stay:  LOS: 12 days  BP (!) 143/99   Pulse 80   Temp 98.5 F (36.9 C) (Oral)   Resp 13   Ht 5\' 8"  (1.727 m)   Wt 92.7 kg   SpO2 100%   BMI 31.07 kg/m   .Intake/Output      08/27 0701 - 08/28 0700 08/28 0701 - 08/29 0700   P.O.  50   I.V. (mL/kg) 4475.5 (48.3) 382.4 (4.1)   Blood 1300.3    Other 6600 1950   NG/GT 90    IV Piggyback 802.5    Total Intake(mL/kg) 13268.3 (143.1) 2382.4 (25.7)   Urine (mL/kg/hr) 8800 (4) 3125 (2.9)   Emesis/NG output 150    Blood 1200    Chest Tube 710 160   Total Output 10860 3285   Net +2408.3 -902.6          . sodium chloride Stopped (12/30/17 1238)  . sodium chloride    . sodium chloride Stopped (12/29/17 2247)  . cefUROXime (ZINACEF)  IV 1.5 g (12/30/17 1617)  . lactated ringers    . lactated ringers Stopped (12/29/17 1540)  . nitroGLYCERIN    . nitroPRUSSide 1.2 mcg/kg/min (12/30/17 1844)     Lab Results  Component Value Date   WBC 13.2 (H) 12/30/2017   HGB 10.2 (L) 12/30/2017   HCT 30.0 (L) 12/30/2017   PLT 110 (L) 12/30/2017   GLUCOSE 102 (H) 12/30/2017   ALT 22 12/29/2017   AST 17 12/29/2017   NA 139 12/30/2017   K 4.4 12/30/2017   CL 107 12/30/2017   CREATININE 1.90 (H) 12/30/2017   BUN 22 12/30/2017   CO2 22 12/30/2017   INR 1.52 12/29/2017   HGBA1C 5.2 12/14/2017   On nipride for b p Urine cleared Cr 1.9   Grace Isaac MD  [facility]eeper [phone] Office [phone] 12/30/2017 6:50 PM

## 2017-12-30 NOTE — Procedures (Signed)
Extubation Procedure Note  Patient Details:   Name: Matthew [name] DOB: 1945-11-19 MRN: [id number]   Airway Documentation:    Vent end date: 12/30/17 Vent end time: 0755   Evaluation  O2 sats: stable throughout Complications: No apparent complications Patient did tolerate procedure well. Bilateral Breath Sounds: Clear   Yes   Patient extubated to 3L nasal cannula per protocol.  Positive cuff leak noted.  No evidence of stridor.  NIF of -20, VC of 1.8L.  Patient able to speak post extubation.  Sats currently 99%.  Vitals are stable.  No complications noted.   Alphia Moh N 12/30/2017, 8:05 AM

## 2017-12-31 ENCOUNTER — Inpatient Hospital Stay (HOSPITAL_COMMUNITY): Payer: No Typology Code available for payment source

## 2017-12-31 LAB — GLUCOSE, CAPILLARY
GLUCOSE-CAPILLARY: 106 mg/dL — AB (ref 70–99)
GLUCOSE-CAPILLARY: 114 mg/dL — AB (ref 70–99)
GLUCOSE-CAPILLARY: 116 mg/dL — AB (ref 70–99)
GLUCOSE-CAPILLARY: 179 mg/dL — AB (ref 70–99)
Glucose-Capillary: 120 mg/dL — ABNORMAL HIGH (ref 70–99)
Glucose-Capillary: 88 mg/dL (ref 70–99)

## 2017-12-31 LAB — BASIC METABOLIC PANEL
Anion gap: 9 (ref 5–15)
BUN: 21 mg/dL (ref 8–23)
CALCIUM: 8.5 mg/dL — AB (ref 8.9–10.3)
CO2: 22 mmol/L (ref 22–32)
CREATININE: 1.84 mg/dL — AB (ref 0.61–1.24)
Chloride: 108 mmol/L (ref 98–111)
GFR calc Af Amer: 40 mL/min — ABNORMAL LOW (ref >60)
GFR, EST NON AFRICAN AMERICAN: 35 mL/min — AB (ref >60)
Glucose, Bld: 159 mg/dL — ABNORMAL HIGH (ref 70–99)
POTASSIUM: 4.2 mmol/L (ref 3.5–5.1)
SODIUM: 139 mmol/L (ref 135–145)

## 2017-12-31 LAB — CBC
HEMATOCRIT: 35.3 % — AB (ref 39.0–52.0)
Hemoglobin: 11.8 g/dL — ABNORMAL LOW (ref 13.0–17.0)
MCH: 30.5 pg (ref 26.0–34.0)
MCHC: 33.4 g/dL (ref 30.0–36.0)
MCV: 91.2 fL (ref 78.0–100.0)
PLATELETS: 125 10*3/uL — AB (ref 150–400)
RBC: 3.87 MIL/uL — ABNORMAL LOW (ref 4.22–5.81)
RDW: 14.6 % (ref 11.5–15.5)
WBC: 14.3 10*3/uL — AB (ref 4.0–10.5)

## 2017-12-31 MED ORDER — INSULIN ASPART 100 UNIT/ML ~~LOC~~ SOLN
0.0000 [IU] | Freq: Three times a day (TID) | SUBCUTANEOUS | Status: DC
  Administered 2017-12-31: 4 [IU] via SUBCUTANEOUS

## 2017-12-31 MED ORDER — METOPROLOL TARTRATE 100 MG PO TABS
100.0000 mg | ORAL_TABLET | Freq: Two times a day (BID) | ORAL | Status: DC
  Administered 2017-12-31 – 2018-01-03 (×7): 100 mg via ORAL
  Filled 2017-12-31: qty 2
  Filled 2017-12-31: qty 1
  Filled 2017-12-31 (×2): qty 2
  Filled 2017-12-31 (×3): qty 1

## 2017-12-31 MED ORDER — GERHARDT'S BUTT CREAM
TOPICAL_CREAM | Freq: Four times a day (QID) | CUTANEOUS | Status: DC
  Administered 2017-12-31 – 2018-01-03 (×11): via TOPICAL
  Filled 2017-12-31: qty 1

## 2017-12-31 MED ORDER — CLONIDINE HCL 0.1 MG PO TABS
0.1000 mg | ORAL_TABLET | Freq: Two times a day (BID) | ORAL | Status: DC
  Administered 2017-12-31 – 2018-01-03 (×6): 0.1 mg via ORAL
  Filled 2017-12-31 (×6): qty 1

## 2017-12-31 MED ORDER — GABAPENTIN 300 MG PO CAPS
300.0000 mg | ORAL_CAPSULE | Freq: Two times a day (BID) | ORAL | Status: DC
  Administered 2017-12-31 – 2018-01-03 (×7): 300 mg via ORAL
  Filled 2017-12-31 (×7): qty 1

## 2017-12-31 MED ORDER — TRAMADOL HCL 50 MG PO TABS
50.0000 mg | ORAL_TABLET | Freq: Two times a day (BID) | ORAL | Status: DC | PRN
  Administered 2018-01-01: 50 mg via ORAL
  Filled 2017-12-31: qty 1

## 2017-12-31 MED ORDER — NITROPRUSSIDE SODIUM 25 MG/ML IV SOLN
0.0000 ug/kg/min | INTRAVENOUS | Status: DC
  Administered 2017-12-31: 0.8 ug/kg/min via INTRAVENOUS
  Filled 2017-12-31: qty 2

## 2017-12-31 MED FILL — Potassium Chloride Inj 2 mEq/ML: INTRAVENOUS | Qty: 40 | Status: AC

## 2017-12-31 MED FILL — Mannitol IV Soln 20%: INTRAVENOUS | Qty: 500 | Status: AC

## 2017-12-31 MED FILL — Dexmedetomidine HCl in NaCl 0.9% IV Soln 400 MCG/100ML: INTRAVENOUS | Qty: 100 | Status: AC

## 2017-12-31 MED FILL — Magnesium Sulfate Inj 50%: INTRAMUSCULAR | Qty: 10 | Status: AC

## 2017-12-31 MED FILL — Sodium Chloride IV Soln 0.9%: INTRAVENOUS | Qty: 2000 | Status: AC

## 2017-12-31 MED FILL — Sodium Bicarbonate IV Soln 8.4%: INTRAVENOUS | Qty: 50 | Status: AC

## 2017-12-31 MED FILL — Heparin Sodium (Porcine) Inj 1000 Unit/ML: INTRAMUSCULAR | Qty: 30 | Status: AC

## 2017-12-31 MED FILL — Lidocaine HCl(Cardiac) IV PF Soln Pref Syr 100 MG/5ML (2%): INTRAVENOUS | Qty: 5 | Status: AC

## 2017-12-31 MED FILL — Electrolyte-R (PH 7.4) Solution: INTRAVENOUS | Qty: 4000 | Status: AC

## 2017-12-31 NOTE — Plan of Care (Signed)
  Problem: Activity: Goal: Risk for activity intolerance will decrease Outcome: Progressing   Problem: Cardiac: Goal: Will achieve and/or maintain hemodynamic stability Outcome: Progressing   Problem: Clinical Measurements: Goal: Postoperative complications will be avoided or minimized Outcome: Progressing   Problem: Respiratory: Goal: Respiratory status will improve Outcome: Progressing   Problem: Skin Integrity: Goal: Risk for impaired skin integrity will decrease Outcome: Progressing

## 2017-12-31 NOTE — Progress Notes (Signed)
CT surgery p.m. Rounds  Stable after AVR CABG x4 Urine output adequate Resting comfortably Continue current care

## 2017-12-31 NOTE — Progress Notes (Signed)
2 Days Post-Op Procedure(s) (LRB): CORONARY ARTERY BYPASS GRAFTING (CABG) x4, LIMA to LAD, SVG to OM1 , SVG to OM2, SVG to Ramus. ENDOSCOPIC SAPHENOUS VEIN HARVEST. (N/A) AORTIC VALVE REPLACEMENT (AVR) 65mm INSPIRIS BY [employer]AR[employer]IFESCIENCE. (N/A) TRANSESOPHAGEAL ECHOCARDIOGRAM (TEE) (N/A) Subjective: Complains of pain over bladder.  Objective: Vital signs in last 24 hours: Temp:  [95.9 F (35.5 C)-99.5 F (37.5 C)] 97.8 F (36.6 C) (08/29 0723) Pulse Rate:  [72-108] 107 (08/29 0723) Cardiac Rhythm: Normal sinus rhythm (08/29 0400) Resp:  [13-29] 29 (08/29 0723) BP: (116-171)/(56-99) 167/81 (08/29 0723) SpO2:  [96 %-100 %] 97 % (08/29 0615) Arterial Line BP: (114-187)/(41-58) 168/56 (08/29 0400) Weight:  [89.2 kg] 89.2 kg (08/29 0500)  Hemodynamic parameters for last 24 hours: PAP: (25-26)/(9) 26/9  Intake/Output from previous day: 08/28 0701 - 08/29 0700 In: 7582.6 [P.O.:50; I.V.:682.6] Out: 6930 [Urine:6570; Chest Tube:360] Intake/Output this shift: No intake/output data recorded.  General appearance: alert and cooperative Neurologic: intact Heart: regular rate and rhythm, S1, S2 normal, no murmur, click, rub or gallop Lungs: clear to auscultation bilaterally Abdomen: mild distension over bladder Extremities: extremities normal, atraumatic, no cyanosis or edema Wound: incision ok  Lab Results: Recent Labs    12/30/17 1603 12/30/17 1610 12/31/17 0500  WBC 13.2*  --  14.3*  HGB 11.0* 10.2* 11.8*  HCT 32.7* 30.0* 35.3*  PLT 110*  --  125*   BMET:  Recent Labs    12/30/17 0423  12/30/17 1610 12/31/17 0500  NA 139  --  139 139  K 4.9  --  4.4 4.2  CL 110  --  107 108  CO2 22  --   --  22  GLUCOSE 128*  --  102* 159*  BUN 25*  --  22 21  CREATININE 1.90*   < > 1.90* 1.84*  CALCIUM 8.5*  --   --  8.5*   < > = values in this interval not displayed.    PT/INR:  Recent Labs    12/29/17 1451  LABPROT 18.1*  INR 1.52   ABG    Component Value Date/Time   PHART 7.360 12/30/2017 0923   HCO3 19.6 (L) 12/30/2017 0923   TCO2 20 (L) 12/30/2017 1610   ACIDBASEDEF 6.0 (H) 12/30/2017 0923   O2SAT 98.0 12/30/2017 0923   CBG (last 3)  Recent Labs    12/31/17 0005 12/31/17 0427 12/31/17 0725  GLUCAP 116* 120* 179*   CXR: clear  Assessment/Plan: S/P Procedure(s) (LRB): CORONARY ARTERY BYPASS GRAFTING (CABG) x4, LIMA to LAD, SVG to OM1 , SVG to OM2, SVG to Ramus. ENDOSCOPIC SAPHENOUS VEIN HARVEST. (N/A) AORTIC VALVE REPLACEMENT (AVR) 29mm INSPIRIS BY [employer]WARDS [employer]IFESCIENCE. (N/A) TRANSESOPHAGEAL ECHOCARDIOGRAM (TEE) (N/A)  POD 2 Remains hypertensive on low dose nipride. Will increase Lopressor to 100 bid and add clonidine 0.1 bid. Continue Norvasc. Would avoid ACE and ARB with his renal dysfunction. Goal is systolic 564.  Bladder distension this am with 1L in bladder on scan. Will manually irrigate foley to decompress. Apparently drainage was not good overnight and he was getting a lot or continuous irrigation in. There are no visible clots and urine is clear. Will see what urology wants to do with irrigation.  Continue IS, ambulation.   LOS: 13 days    Matthew [name] 12/31/2017

## 2017-12-31 NOTE — Progress Notes (Signed)
Matthew [name] is draining clear this evening.  The patient is sitting up in bed and eating a salad. He has no complaints. Will plan for a voiding trial at [facility] on 8/30.  The nursing staff was given instructions on how/when to irrigate his catheter and adjust his CBI. If there are any issues with his voiding trial, please call Dr. Karsten Ro over the weekend.  [name], MD Urology

## 2018-01-01 ENCOUNTER — Telehealth: Payer: Self-pay | Admitting: Cardiology

## 2018-01-01 LAB — BASIC METABOLIC PANEL
Anion gap: 11 (ref 5–15)
BUN: 24 mg/dL — AB (ref 8–23)
CHLORIDE: 107 mmol/L (ref 98–111)
CO2: 20 mmol/L — AB (ref 22–32)
Calcium: 8.7 mg/dL — ABNORMAL LOW (ref 8.9–10.3)
Creatinine, Ser: 1.84 mg/dL — ABNORMAL HIGH (ref 0.61–1.24)
GFR calc non Af Amer: 35 mL/min — ABNORMAL LOW (ref >60)
GFR, EST AFRICAN AMERICAN: 40 mL/min — AB (ref >60)
Glucose, Bld: 101 mg/dL — ABNORMAL HIGH (ref 70–99)
POTASSIUM: 4.1 mmol/L (ref 3.5–5.1)
Sodium: 138 mmol/L (ref 135–145)

## 2018-01-01 LAB — GLUCOSE, CAPILLARY: GLUCOSE-CAPILLARY: 95 mg/dL (ref 70–99)

## 2018-01-01 MED ORDER — MOVING RIGHT ALONG BOOK
Freq: Once | Status: AC
  Administered 2018-01-01: 10:00:00
  Filled 2018-01-01: qty 1

## 2018-01-01 MED ORDER — SODIUM CHLORIDE 0.9% FLUSH
3.0000 mL | Freq: Two times a day (BID) | INTRAVENOUS | Status: DC
  Administered 2018-01-01 – 2018-01-02 (×3): 3 mL via INTRAVENOUS

## 2018-01-01 MED ORDER — ASPIRIN EC 325 MG PO TBEC
325.0000 mg | DELAYED_RELEASE_TABLET | Freq: Every day | ORAL | Status: DC
  Administered 2018-01-01 – 2018-01-03 (×3): 325 mg via ORAL
  Filled 2018-01-01 (×3): qty 1

## 2018-01-01 MED ORDER — ONDANSETRON HCL 4 MG/2ML IJ SOLN: 4.0000 mg | Freq: Four times a day (QID) | INTRAMUSCULAR | Status: DC | PRN

## 2018-01-01 MED ORDER — OXYCODONE HCL 5 MG PO TABS
5.0000 mg | ORAL_TABLET | ORAL | Status: DC | PRN
  Administered 2018-01-01 – 2018-01-03 (×8): 5 mg via ORAL
  Filled 2018-01-01 (×10): qty 1

## 2018-01-01 MED ORDER — PANTOPRAZOLE SODIUM 40 MG PO TBEC
40.0000 mg | DELAYED_RELEASE_TABLET | Freq: Every day | ORAL | Status: DC
  Administered 2018-01-01 – 2018-01-03 (×3): 40 mg via ORAL
  Filled 2018-01-01 (×4): qty 1

## 2018-01-01 MED ORDER — BISACODYL 5 MG PO TBEC
10.0000 mg | DELAYED_RELEASE_TABLET | Freq: Every day | ORAL | Status: DC | PRN
  Administered 2018-01-01: 10 mg via ORAL
  Filled 2018-01-01: qty 2

## 2018-01-01 MED ORDER — ACETAMINOPHEN 325 MG PO TABS
650.0000 mg | ORAL_TABLET | Freq: Four times a day (QID) | ORAL | Status: DC | PRN
  Administered 2018-01-01: 650 mg via ORAL
  Filled 2018-01-01: qty 2

## 2018-01-01 MED ORDER — ALBUTEROL SULFATE (2.5 MG/3ML) 0.083% IN NEBU: 2.5000 mg | INHALATION_SOLUTION | RESPIRATORY_TRACT | Status: DC | PRN

## 2018-01-01 MED ORDER — ATORVASTATIN CALCIUM 80 MG PO TABS
80.0000 mg | ORAL_TABLET | Freq: Every day | ORAL | Status: DC
  Administered 2018-01-01 – 2018-01-02 (×2): 80 mg via ORAL
  Filled 2018-01-01 (×2): qty 1

## 2018-01-01 MED ORDER — TRAMADOL HCL 50 MG PO TABS: 50.0000 mg | ORAL_TABLET | Freq: Two times a day (BID) | ORAL | Status: DC | PRN

## 2018-01-01 MED ORDER — DOCUSATE SODIUM 100 MG PO CAPS
200.0000 mg | ORAL_CAPSULE | Freq: Every day | ORAL | Status: DC
  Administered 2018-01-01 – 2018-01-03 (×3): 200 mg via ORAL
  Filled 2018-01-01 (×4): qty 2

## 2018-01-01 MED ORDER — ONDANSETRON HCL 4 MG PO TABS: 4.0000 mg | ORAL_TABLET | Freq: Four times a day (QID) | ORAL | Status: DC | PRN

## 2018-01-01 MED ORDER — SODIUM CHLORIDE 0.9 % IV SOLN: 250.0000 mL | INTRAVENOUS | Status: DC | PRN

## 2018-01-01 MED ORDER — SODIUM CHLORIDE 0.9% FLUSH: 3.0000 mL | INTRAVENOUS | Status: DC | PRN

## 2018-01-01 MED ORDER — BISACODYL 10 MG RE SUPP: 10.0000 mg | Freq: Every day | RECTAL | Status: DC | PRN

## 2018-01-01 NOTE — Plan of Care (Signed)
  Problem: Education: Goal: Knowledge of General Education information will improve Description Including pain rating scale, medication(s)/side effects and non-pharmacologic comfort measures Outcome: Progressing   Problem: Education: Goal: Understanding of cardiac disease, CV risk reduction, and recovery process will improve Outcome: Progressing   Problem: Activity: Goal: Ability to tolerate increased activity will improve Outcome: Progressing   Problem: Cardiac: Goal: Ability to achieve and maintain adequate cardiopulmonary perfusion will improve Outcome: Progressing   Problem: Health Behavior/Discharge Planning: Goal: Ability to safely manage health-related needs after discharge will improve Outcome: Progressing   Problem: Urinary Elimination: Goal: Ability to achieve and maintain adequate renal perfusion and functioning will improve Outcome: Progressing  Continuous bladder irrigation Problem: Health Behavior/Discharge Planning: Goal: Ability to safely manage health-related needs after discharge will improve Outcome: Progressing

## 2018-01-01 NOTE — Progress Notes (Signed)
Patient to unit from [place] in stable condition. Oriented to unit and room and telemtry applied. CCMD notified and CHG bath given. Patient ambulating w minimal assist or FWW in room and denies questions at this time.

## 2018-01-01 NOTE — Progress Notes (Signed)
3 Days Post-Op Procedure(s) (LRB): CORONARY ARTERY BYPASS GRAFTING (CABG) x4, LIMA to LAD, SVG to OM1 , SVG to OM2, SVG to Ramus. ENDOSCOPIC SAPHENOUS VEIN HARVEST. (N/A) AORTIC VALVE REPLACEMENT (AVR) 66mm INSPIRIS BY EDWARDS LIFESCIENCE. (N/A) TRANSESOPHAGEAL ECHOCARDIOGRAM (TEE) (N/A) Subjective: No complaints. Anxious to go home.  Objective: Vital signs in last 24 hours: Temp:  [97.8 F (36.6 C)-98.8 F (37.1 C)] 98.8 F (37.1 C) (08/29 1957) Pulse Rate:  [70-107] 84 (08/30 0700) Cardiac Rhythm: Normal sinus rhythm (08/30 0400) Resp:  [16-29] 17 (08/30 0700) BP: (112-175)/(65-96) 152/75 (08/30 0700) SpO2:  [96 %-100 %] 99 % (08/30 0700) Weight:  [88.5 kg] 88.5 kg (08/30 0530)  Hemodynamic parameters for last 24 hours:    Intake/Output from previous day: 08/29 0701 - 08/30 0700 In: 4031.7 [P.O.:840; I.V.:16.7] Out: 8655 [Urine:8655] Intake/Output this shift: No intake/output data recorded.  General appearance: alert and cooperative Neurologic: intact Heart: regular rate and rhythm, S1, S2 normal, no murmur, click, rub or gallop Lungs: clear to auscultation bilaterally Extremities: mild edema in left hand. Wound: dressings dry  Lab Results: Recent Labs    12/30/17 1603 12/30/17 1610 12/31/17 0500  WBC 13.2*  --  14.3*  HGB 11.0* 10.2* 11.8*  HCT 32.7* 30.0* 35.3*  PLT 110*  --  125*   BMET:  Recent Labs    12/31/17 0500 01/01/18 0212  NA 139 138  K 4.2 4.1  CL 108 107  CO2 22 20*  GLUCOSE 159* 101*  BUN 21 24*  CREATININE 1.84* 1.84*  CALCIUM 8.5* 8.7*    PT/INR:  Recent Labs    12/29/17 1451  LABPROT 18.1*  INR 1.52   ABG    Component Value Date/Time   PHART 7.360 12/30/2017 0923   HCO3 19.6 (L) 12/30/2017 0923   TCO2 20 (L) 12/30/2017 1610   ACIDBASEDEF 6.0 (H) 12/30/2017 0923   O2SAT 98.0 12/30/2017 0923   CBG (last 3)  Recent Labs    12/31/17 1601 12/31/17 2206 01/01/18 0657  GLUCAP 114* 106* 95    Assessment/Plan: S/P  Procedure(s) (LRB): CORONARY ARTERY BYPASS GRAFTING (CABG) x4, LIMA to LAD, SVG to OM1 , SVG to OM2, SVG to Ramus. ENDOSCOPIC SAPHENOUS VEIN HARVEST. (N/A) AORTIC VALVE REPLACEMENT (AVR) 19mm INSPIRIS BY EDWARDS LIFESCIENCE. (N/A) TRANSESOPHAGEAL ECHOCARDIOGRAM (TEE) (N/A)  POD 3 BP seems under better control. Will continue Lopressor, Norvasc, and clonidine for now. Goal is SBP 130's. No ACE or ARB with renal dysfunction.  CKD: creat stable. Weight is about 5 lbs over preop so could use some diuretic. His weight is coming down so he may be spontaneously diuresing.   Voiding trial this am per urology.  Will transfer to 4E. Continue ambulation and IS.   LOS: 14 days    [name] 01/01/2018

## 2018-01-01 NOTE — Discharge Summary (Signed)
Physician Discharge Summary  Patient ID: Matthew [name] MRN: [id number] DOB/AGE: 09-24-1945 72 y.o.  Admit date: 12/16/2017 Discharge date: 01/03/2018  Admission Diagnoses: Patient Active Problem List   Diagnosis Date Noted  . Chest pain in adult   . Non-ST elevation (NSTEMI) myocardial infarction ([place])   . PAD (peripheral artery disease) ([place])   . Severe aortic stenosis   . 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  . Great toe amputation status, left ([place]) 04/22/2016  . Atherosclerosis of native arteries of left leg with ulceration of other part of lower left leg ([place]) 04/07/2016  . Osteomyelitis of toe of left foot ([place]) 11/16/2015  . HTN (hypertension) 11/16/2015  . Diabetes mellitus, type 2 ([place]) 11/16/2015  . CKD (chronic kidney disease), stage III ([place]) 11/16/2015  . Hyperlipidemia 11/16/2015    Discharge Diagnoses:  Active Problems:   BPH with obstruction/lower urinary tract symptoms   Non-ST elevation (NSTEMI) myocardial infarction [facility])   PAD (peripheral artery disease) ([facility])   Severe aortic stenosis   Chest pain in adult   S/P CABG x 4  Patient Active Problem List   Diagnosis Date Noted  . 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 Chest pain in adult   . Non-ST elevation (NSTEMI) myocardial infarction ([place])   . PAD (peripheral artery disease) ([place])   . Severe aortic stenosis   . 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  . Great toe amputation status, left ([place]) 04/22/2016  . Atherosclerosis of native arteries of left leg with ulceration of other part of lower left leg ([place]) 04/07/2016  . Osteomyelitis of toe of left foot ([place]) 11/16/2015  . HTN (hypertension) 11/16/2015  . Diabetes mellitus, type 2 ([place]) 11/16/2015  . CKD (chronic kidney disease), stage III ([place]) 11/16/2015  . Hyperlipidemia 11/16/2015   History the present illness:at time of consultation  This gentleman is a 72 year old with a history of hypertension,  hyperlipidemia, prediabetes, stage III chronic kidney disease with a creatinine of 1.82, peripheral vascular disease status post left fem-tib bypass in 2017 by Dr. Sherren Mocha Early with subsequent amputation of the left great toe, ongoing heavy smoking with COPD, and known moderate to severe aortic stenosis has been followed at the [facility] in [place].  The patient is also had bladder stones and prostate enlargement and has been followed by Dr. Lovena Neighbours.  He reportedly underwent cardiology clearance at the [facility] in [place] and then underwent TURP and Cystolithoapaxy by Dr. Lovena Neighbours on 12/16/2017.  He tolerated the procedure well but apparently could not urinate after his Foley was removed require replacement of the Foley catheter and irrigation for clot retention.  He then developed sharp right-sided chest pain which was initially rated at 8 out of 10.  Electrocardiogram showed inferolateral ST segment depression with T wave inversions that were similar to his prior EKG.  A troponin level was measured at 2.58.  He was seen in consultation by cardiology on 12/18/2017 and underwent a 2D echocardiogram which showed severe aortic stenosis with a trileaflet aortic valve with a mean gradient of 49 mmHg.  Left ventricular ejection fraction was 60 to 65% with grade 2 diastolic dysfunction and moderate LVH.  His chest pain resolved and due to his elevated creatinine catheterization was not performed until today.  This shows 95% ostial left main stenosis as well as 95% ostial LAD stenosis.  The proximal to mid LAD had 75% stenosis.  The mid left circumflex had 60% stenosis before 2 marginal branches.  The  right coronary was a small nondominant vessel with complete occlusion in the mid to distal vessel.  The mean aortic valve gradient was 42 mmHg.  PA pressure was 41/16 with an LVEDP of 22.  Mean wedge pressure was 16.  The patient lives in [place] with his wife.  He says he walks regularly without any chest pain or  shortness of breath.  He feels like his stamina is normal.  His wife reports that he has frequent episodes of indigestion and takes Tums.  He continues to smoke greater than 1/2 p  He was seen in cardiothoracic surgical consultation by Dr. Cyndia Bent who evaluated the patient and studies and agree with recommendations to proceed with CABG.  Discharged Condition: good  Hospital Course: Following cardiac catheterization the patient was seen by the surgeon and it was felt that CABG was warranted.  On 12/29/2017 he was taken to the operating room where he underwent the below described procedure.  He tolerated it well and was taken to the surgical intensive care unit in stable condition.  Operative hospital course:  It is overall progressed well.  Initially required night pride for significant postoperative hypertension but this was being able to be weaned after extubation.  He remained neurologically intact.  He does have a notable high-grade right ICA stenosis.  He does have chronic kidney disease but his creatinine has remained at its baseline level and is currently 1.84 on 01/01/2018.  He has some mild expected acute blood loss anemia and values are stable.  His recent hemoglobin hematocrit dated 12/31/2017 are 11.8 and 35.3 respectively.  All routine lines, monitors and drainage devices have been discontinued in standard fashion.  He has had some continued postoperative hypertension which is improving control using Norvasc, Lopressor and clonidine.  Is not felt to be a candidate for  ACE-I/ARB due to his renal dysfunction.  He is being followed by urology and is currently undergoing a voiding trial.  This will be reassessed prior to discharge by urology with plan for follow-up to be outlined by them.  He is tolerating gradually increasing activities using cardiac rehab modalities.  Incisions are noted to be healing well without evidence of infection.  Tolerating diet.  He is maintaining normal sinus rhythm.   Oxygen has been weaned and he maintains good saturations on room air.  At the time of discharge the patient is felt to be quite stable.  Consults: cardiology and urology  Significant Diagnostic Studies: angiography: Cardiac catheterization  Treatments: surgery:                                                                                        CARDIOVASCULAR SURGERY OPERATIVE NOTE  12/29/2017  Surgeon:  [name], MD  First Assistant: Jadene Pierini,  PA-C   Preoperative Diagnosis:  Severe multi-vessel coronary artery disease   Postoperative Diagnosis:  Same   Procedure:  1. Median Sternotomy 2. Extracorporeal circulation 3.   Coronary artery bypass grafting x 4   Left internal mammary graft to the LAD  SVG to Ramus  Sequential SVG to OM1 and OM2  4.   Endoscopic vein harvest from the right  leg 5.   Aortic valve replacement using a 23 mm INSPIRIS RESILIA PERICARDIAL VALVE  Postoperative diagnosis: 1.  2 cm bladder stone 2.  BPH with bladder outlet obstruction  Procedure(s): 1.  Cystolitholapaxy 2.  Bipolar TURP  Surgeon: [name], MD  Assistants: None  Anesthesia: General  Complications: None  EBL: 50 mL  Specimens: 1.  Bladder stone 2.  Prostate chips  Drains/Catheters: 1.  22 French three-way Foley catheter with 30 mL in the balloon  Intraoperative findings:   3. 2 cm bladder stone 4. Trilobar prostatic urethral obstruction  Anesthesia:  General EndotrachealDischarge Exam: Blood pressure 139/72, pulse 71, temperature 99 F (37.2 C), temperature source Oral, resp. rate 20, height 5\' 8"  (1.727 m), weight 86.4 kg, SpO2 96 %.   General appearance: alert, cooperative and no distress Heart: regular rate and rhythm, S1, S2 normal, no murmur, click, rub or gallop Abdomen: soft, non-tender; bowel sounds normal; no masses,  no organomegaly Extremities: extremities normal, atraumatic, no cyanosis or edema Wound:  clean and dry    Disposition: Discharge disposition: 01-Home or Self Care       Discharge Instructions    Amb Referral to Cardiac Rehabilitation   Complete by:  As directed    Diagnosis:   CABG Valve Replacement NSTEMI     Valve:  Aortic   CABG X ___:  4 Comment - LIMA   Call MD for:  persistant nausea and vomiting   Complete by:  As directed    Call MD for:  persistant nausea and vomiting   Complete by:  As directed    Call MD for:  severe uncontrolled pain   Complete by:  As directed    Call MD for:  severe uncontrolled pain   Complete by:  As directed    Call MD for:  temperature >100.4   Complete by:  As directed    Call MD for:  temperature >100.4   Complete by:  As directed    Diet - low sodium heart healthy   Complete by:  As directed    Diet - low sodium heart healthy   Complete by:  As directed    Discharge instructions   Complete by:  As directed    1.  No lifting > 10 pounds for 1 week 2.  No driving if taking narcotic pain medication 3.  Ok to shower   Discharge instructions   Complete by:  As directed    1.  Ok to shower 2.  No driving if taking narcotic pain medication   Increase activity slowly   Complete by:  As directed    Increase activity slowly   Complete by:  As directed      Allergies as of 01/03/2018   No Known Allergies     Medication List    STOP taking these medications   doxazosin 4 MG tablet Commonly known as:  CARDURA   lisinopril 10 MG tablet Commonly known as:  PRINIVIL,ZESTRIL   methocarbamol 750 MG tablet Commonly known as:  ROBAXIN   NYQUIL PO   oxyCODONE-acetaminophen 10-325 MG tablet Commonly known as:  PERCOCET     TAKE these medications   acetaminophen 325 MG tablet Commonly known as:  TYLENOL Take 2 tablets (650 mg total) by mouth every 6 (six) hours as needed for mild pain.   amLODipine 10 MG tablet Commonly known as:  NORVASC Take 10 mg by mouth every morning. For heart   aspirin 325 MG EC  tablet Take 1 tablet (325 mg total) by mouth daily.   atorvastatin 80 MG tablet Commonly known as:  LIPITOR Take 1 tablet (80 mg total) by mouth daily at 6 PM. What changed:    medication strength  how much to take  when to take this  additional instructions   bismuth subsalicylate 416 SA/63KZ suspension Commonly known as:  PEPTO BISMOL Take 30 mLs by mouth every 6 (six) hours as needed for indigestion or diarrhea or loose stools.   cloNIDine 0.1 MG tablet Commonly known as:  CATAPRES Take 1 tablet (0.1 mg total) by mouth 2 (two) times daily.   ferrous sulfate 325 (65 FE) MG tablet Take 325 mg by mouth every Monday, Wednesday, and Friday.   gabapentin 600 MG tablet Commonly known as:  NEURONTIN Take 600 mg by mouth 3 (three) times daily. For peripheral neuropathy   latanoprost 0.005 % ophthalmic solution Commonly known as:  XALATAN Place 1 drop into both eyes at bedtime.   metoprolol tartrate 100 MG tablet Commonly known as:  LOPRESSOR Take 1 tablet (100 mg total) by mouth 2 (two) times daily.   Omega-3 Fish Oil 300 MG Caps Take 300 mg by mouth 2 (two) times daily.   oxybutynin 5 MG tablet Commonly known as:  DITROPAN Take 1 tablet (5 mg total) by mouth every 8 (eight) hours as needed for bladder spasms.   oxyCODONE 5 MG immediate release tablet Commonly known as:  Oxy IR/ROXICODONE Take 1 tablet (5 mg total) by mouth every 6 (six) hours as needed for severe pain.   phenazopyridine 200 MG tablet Commonly known as:  PYRIDIUM Take 1 tablet (200 mg total) by mouth 3 (three) times daily as needed (for pain with urination).   potassium citrate 10 MEQ (1080 MG) SR tablet Commonly known as:  UROCIT-K Take 10 mEq by mouth 2 (two) times daily.      Follow-up Information    [facility] In 2 weeks.   Contact information: [place] [phone]       [name], MD Follow up.   Specialty:   Cardiothoracic Surgery Why:  Appointment to see the surgeon on October 2 at  12:30 PM.  Please obtain a chest x-ray at [place] at 12 noon.  [facility] is located in the same office complex on the first floor. Contact information: [address] [place] [address] [phone]        [facility] [place] Follow up.   Specialty:  Cardiothoracic Surgery Why:  You have an appointment with the nurse for suture removal on September 6 at 10:30 AM. Contact information: [place] [place] [phone]       Lelon Perla, MD Follow up.   Specialty:  Cardiology Why:  Please see discharge paperwork regarding a 2-week cardiology follow-up appointment. Contact information: [place] [address] [phone]        Call [name], MD.   Specialty:  Urology Why:  For an appointment in 1-2 weeks when you get home. Contact information: [address] [place] [address] [phone]        nursing appointment Follow up.   Why:  Please arrive on 9/9 at 10:15am for a suture removal appointment.  Contact information: Dr. Vivi Martens office          Signed: Johann Capers  N Darlis Wragg 01/03/2018, 8:17 AM

## 2018-01-01 NOTE — Progress Notes (Signed)
CARDIAC REHAB PHASE I   Offered to ambulate with pt, pt declining at this time stating he walked up from [facility]. Pt encouraged to get in one more walk today as pt has ambulated twice today. Pt demonstrated 1750 on IS, encouraged continued use. Will continue to follow pt.  [phone] Rufina Falco, RN BSN 01/01/2018 2:43 PM

## 2018-01-01 NOTE — Consult Note (Addendum)
[facility] CM Primary Care Navigator  01/01/2018  Matthew [name] [birth year] [id number]   Went to seepatient in the room to identify possible discharge needs buthe was transferred to another unit 4E 18perstaffreport.  Per MD note,patienthas severe coronary artery disease and severe aortic stenosis, had small NSTEMI (non ST elevated myocardial infarction) after urological surgery, developed hematuria post-op; underwent coronary bypass grafting x 4.   Per Inpatient CM note, patient is followed by [facility] for PCP services with Dr. Herold Harms (primary care provider).  Patient has discharge instruction to follow-up withcardiothoracic surgery on 01/08/18.    AddendumMartin Majestic to seepatientat his new room location, met with patient's sister Rip Harbour) as well, to identify possible discharge needs.  Patient reports that he was transferred here from [facility] when he had a "heart attack after a kidney stone surgery".  Patient states that hisprimary care provideris Dr. Herold Harms with [facility] (not a [facility] provider). Patient reports that he has not ever seen or known Dr. Redmond School with [facility] (per BI report) as a primary care provider.  Patientand sister wereencouragedto follow-up withhisprimary care providerafter hospital discharge or whenhereturns back home in order to help managehishealth issues/ needs.    For additional questions please contact:   Edwena Felty A. Johnette Teigen, BSN, RN-BC [facility] PRIMARY CARE Navigator Cell: [phone]

## 2018-01-01 NOTE — Telephone Encounter (Signed)
New message:      TOC on 9/17 @ 12:40 with [place] per Phylliss Bob

## 2018-01-02 NOTE — Progress Notes (Addendum)
[address]       [place] [address]             [phone]      4 Days Post-Op Procedure(s) (LRB): CORONARY ARTERY BYPASS GRAFTING (CABG) x4, LIMA to LAD, SVG to OM1 , SVG to OM2, SVG to Ramus. ENDOSCOPIC SAPHENOUS VEIN HARVEST. (N/A) AORTIC VALVE REPLACEMENT (AVR) 4mm INSPIRIS BY EDWARDS LIFESCIENCE. (N/A) TRANSESOPHAGEAL ECHOCARDIOGRAM (TEE) (N/A) Subjective: Wants to go home.   Objective: Vital signs in last 24 hours: Temp:  [98 F (36.7 C)-99.4 F (37.4 C)] 98.7 F (37.1 C) (08/31 0424) Pulse Rate:  [71-91] 73 (08/30 1422) Cardiac Rhythm: Normal sinus rhythm (08/31 0835) Resp:  [17-23] 17 (08/30 1422) BP: (141-163)/(73-85) 155/75 (08/31 0424) SpO2:  [95 %-99 %] 98 % (08/30 2005) Weight:  [87.8 kg] 87.8 kg (08/31 0430)     Intake/Output from previous day: 08/30 0701 - 08/31 0700 In: 565 [P.O.:565] Out: 220 [Urine:220] Intake/Output this shift: Total I/O In: 360 [P.O.:360] Out: -   General appearance: alert, cooperative and no distress Heart: regular rate and rhythm, S1, S2 normal, no murmur, click, rub or gallop Lungs: clear to auscultation bilaterally Abdomen: soft, non-tender; bowel sounds normal; no masses,  no organomegaly Extremities: extremities normal, atraumatic, no cyanosis or edema Wound: clean and dry  Lab Results: Recent Labs    12/30/17 1603 12/30/17 1610 12/31/17 0500  WBC 13.2*  --  14.3*  HGB 11.0* 10.2* 11.8*  HCT 32.7* 30.0* 35.3*  PLT 110*  --  125*   BMET:  Recent Labs    12/31/17 0500 01/01/18 0212  NA 139 138  K 4.2 4.1  CL 108 107  CO2 22 20*  GLUCOSE 159* 101*  BUN 21 24*  CREATININE 1.84* 1.84*  CALCIUM 8.5* 8.7*    PT/INR: No results for input(s): LABPROT, INR in the last 72 hours. ABG    Component Value Date/Time   PHART 7.360 12/30/2017 0923   HCO3 19.6 (L) 12/30/2017 0923   TCO2 20 (L) 12/30/2017 1610   ACIDBASEDEF 6.0 (H) 12/30/2017 0923   O2SAT 98.0 12/30/2017 0923   CBG (last  3)  Recent Labs    12/31/17 1601 12/31/17 2206 01/01/18 0657  GLUCAP 114* 106* 95    Assessment/Plan: S/P Procedure(s) (LRB): CORONARY ARTERY BYPASS GRAFTING (CABG) x4, LIMA to LAD, SVG to OM1 , SVG to OM2, SVG to Ramus. ENDOSCOPIC SAPHENOUS VEIN HARVEST. (N/A) AORTIC VALVE REPLACEMENT (AVR) 85mm INSPIRIS BY EDWARDS LIFESCIENCE. (N/A) TRANSESOPHAGEAL ECHOCARDIOGRAM (TEE) (N/A)  1. CV- NSR with rate in the 80s, BP well controlled. Continue Lopressor, Norvasc, and clonidine. Prn hydralazine. Goal BP is 563J Systolic. No ACEI/ARB due to renal dysfunction. 2. Pulm- tolerating room air with good oxygen saturation.  3. Renal-creatinine 1.84, electrolytes okay. Negative 4L yesterday, making excellent urine. Weight is trending down. Continue to avoid nephrotoxic agents 4. H and H has been stable, expected acute blood loss anemia.  5. Endo-blood glucose well controlled on current regimen.  6. Urology following- successful voiding trial  Plan: Discontinue EPW today since rhythm has been stable. Making good urine since catheter has been pulled. BP high this morning but he has not received any of his medications. Probably home in the am.     LOS: 15 days    Matthew [name] 01/02/2018  I have seen and examined the patient and agree with the assessment and plan as outlined.  Tentatively plan d/c home tomorrow  Rexene Alberts,  MD 01/02/2018 12:52 PM

## 2018-01-02 NOTE — Progress Notes (Signed)
Pacer wires x 2 pulled per protocol with tips intact. Patient tolerated well and resting in bed. He agrees to one hour of bedrest and vitals stable. Will continue to monitor.

## 2018-01-02 NOTE — Progress Notes (Signed)
Patient ID: Matthew [name], male   DOB: 21-Apr-1946, 72 y.o.   MRN: [id number]    Assessment: Gross hematuria after recent TURP: His hematuria has now cleared.  His Foley catheter has been removed and he is voiding without difficulty.  He will follow-up as an outpatient.   Plan:  1.  I have placed his follow-up instructions on the chart. 2.  Will sign off.  Please call if further urologic issues arise.   Subjective: Patient reports that after his catheter was removed yesterday he has been urinating without difficulty.  He said he has a good, strong stream and is quite pleased with the results of his transurethral resection.  In addition his urine remains clear.  Objective: Vital signs in last 24 hours: Temp:  [98 F (36.7 C)-99.4 F (37.4 C)] 98.7 F (37.1 C) (08/31 0424) Pulse Rate:  [71-91] 73 (08/30 1422) Resp:  [17-23] 17 (08/30 1422) BP: (141-163)/(73-85) 155/75 (08/31 0424) SpO2:  [95 %-99 %] 98 % (08/30 2005) Weight:  [87.8 kg] 87.8 kg (08/31 0430)A  Intake/Output from previous day: 08/30 0701 - 08/31 0700 In: 565 [P.O.:565] Out: 220 [Urine:220] Intake/Output this shift: No intake/output data recorded.  Past Medical History:  Diagnosis Date  . Arthritis   . Bladder stones   . BPH (benign prostatic hyperplasia)   . CAD (coronary artery disease)    a. Pt reports prior stenting @ [facility] - no records in Epic.  Marland Kitchen Chronic kidney disease (CKD), stage III (moderate) (HCC)    dx from [place] in [place], [place]  . Heart murmur    a. AS  . Hyperlipidemia   . Hypertension   . Moderate to severe aortic stenosis    a. PER 01-15-16 [facility].  Marland Kitchen Neuropathy    FEET AND HANDS  . Peripheral vascular disease ([place])    a. 04/2016 s/p L Fem->PT bypass; b. 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 04/2017 LLE Duplex: No restenosis. ABI R 0.63, ABI L 1.01.  Marland Kitchen Post traumatic stress disorder (PTSD)   . Pre-diabetes     Physical Exam:  General: Awake, alert and in no  apparent distress Lungs: Normal respiratory effort, chest expands symmetrically.  Abdomen: Soft, non-tender & non-distended.  Lab Results: Recent Labs    12/30/17 1603 12/30/17 1610 12/31/17 0500  WBC 13.2*  --  14.3*  HGB 11.0* 10.2* 11.8*  HCT 32.7* 30.0* 35.3*   BMET Recent Labs    12/31/17 0500 01/01/18 0212  NA 139 138  K 4.2 4.1  CL 108 107  CO2 22 20*  GLUCOSE 159* 101*  BUN 21 24*  CREATININE 1.84* 1.84*  CALCIUM 8.5* 8.7*   No results for input(s): LABURIN in the last 72 hours. Results for orders placed or performed during the hospital encounter of 12/16/17  MRSA PCR Screening     Status: None   Collection Time: 12/18/17 11:33 AM  Result Value Ref Range Status   MRSA by PCR NEGATIVE NEGATIVE Final    Comment:        The GeneXpert MRSA Assay (FDA approved for NASAL specimens only), is one component of a comprehensive MRSA colonization surveillance program. It is not intended to diagnose MRSA infection nor to guide or monitor treatment for MRSA infections. Performed at [facility], [place] [address], [place], [place] [address]     Studies/Results: No results found.    Ayah Cozzolino C 01/02/2018, 9:03 AM

## 2018-01-02 NOTE — Progress Notes (Signed)
CARDIAC REHAB PHASE I   PRE:  Rate/Rhythm: 80 SR  BP:  Supine: 164/92 Sitting:   Standing:    SaO2: 97% RA  MODE:  Ambulation: 470 ft   POST:  Rate/Rhythm: 104 ST w/ PVCs  BP:  Supine:   Sitting: 168/84  Standing:    SaO2: 99% RA  [id number] Patient tolerated ambulation fair with assist x1 and pushing rolling walker. Gait slow, steady, no c/o with ambulation. Blood pressure elevated before and after walk. To bed after walk, call bell within reach. O[facility] education complete including IS use, restrictions, sternal precautions, risk factor modification, tobacco cessation, heart healthy diet, and activity progression. Patient demonstrates understanding of information provided. Discussed phase 2 cardiac rehab and referral sent to program at [place], [place], [facility]

## 2018-01-03 MED ORDER — ATORVASTATIN CALCIUM 80 MG PO TABS: 80.0000 mg | ORAL_TABLET | Freq: Every day | ORAL | 1 refills | Status: AC

## 2018-01-03 MED ORDER — CLONIDINE HCL 0.1 MG PO TABS: 0.1000 mg | ORAL_TABLET | Freq: Two times a day (BID) | ORAL | 11 refills | Status: DC

## 2018-01-03 MED ORDER — OXYCODONE HCL 5 MG PO TABS: 5.0000 mg | ORAL_TABLET | Freq: Four times a day (QID) | ORAL | 0 refills | Status: DC | PRN

## 2018-01-03 MED ORDER — METOPROLOL TARTRATE 100 MG PO TABS: 100.0000 mg | ORAL_TABLET | Freq: Two times a day (BID) | ORAL | 1 refills | Status: DC

## 2018-01-03 MED ORDER — ACETAMINOPHEN 325 MG PO TABS: 650.0000 mg | ORAL_TABLET | Freq: Four times a day (QID) | ORAL | Status: DC | PRN

## 2018-01-03 MED ORDER — ASPIRIN 325 MG PO TBEC: 325.0000 mg | DELAYED_RELEASE_TABLET | Freq: Every day | ORAL | 0 refills | Status: DC

## 2018-01-03 NOTE — Progress Notes (Signed)
01/03/2018 Discharge AVS meds taken today and those due this evening reviewed.  Follow-up appointments and when to call md reviewed.  D/C IV and TELE.  Questions and concerns addressed.   D/C home per orders. [place]

## 2018-01-03 NOTE — Progress Notes (Signed)
      [address]       [place] [address]             [phone]      5 Days Post-Op Procedure(s) (LRB): CORONARY ARTERY BYPASS GRAFTING (CABG) x4, LIMA to LAD, SVG to OM1 , SVG to OM2, SVG to Ramus. ENDOSCOPIC SAPHENOUS VEIN HARVEST. 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 (N/A) TRANSESOPHAGEAL ECHOCARDIOGRAM (TEE) (N/A) Subjective: Wires out yesterday. He feels good this morning and is ready for home.  Objective: Vital signs in last 24 hours: Temp:  [98.3 F (36.8 C)-99 F (37.2 C)] 99 F (37.2 C) (09/01 0325) Pulse Rate:  [60-79] 71 (09/01 0325) Cardiac Rhythm: Normal sinus rhythm (08/31 2040) Resp:  [19-23] 20 (09/01 0325) BP: (117-150)/(46-72) 139/72 (09/01 0325) SpO2:  [96 %-99 %] 96 % (09/01 0325) Weight:  [86.4 kg] 86.4 kg (09/01 0325)     Intake/Output from previous day: 08/31 0701 - 09/01 0700 In: 600 [P.O.:600] Out: 375 [Urine:375] Intake/Output this shift: No intake/output data recorded.  General appearance: alert, cooperative and no distress Heart: regular rate and rhythm, S1, S2 normal, no murmur, click, rub or gallop Abdomen: soft, non-tender; bowel sounds normal; no masses,  no organomegaly Extremities: extremities normal, atraumatic, no cyanosis or edema Wound: clean and dry  Lab Results: No results for input(s): WBC, HGB, HCT, PLT in the last 72 hours. BMET:  Recent Labs    01/01/18 0212  NA 138  K 4.1  CL 107  CO2 20*  GLUCOSE 101*  BUN 24*  CREATININE 1.84*  CALCIUM 8.7*    PT/INR: No results for input(s): LABPROT, INR in the last 72 hours. ABG    Component Value Date/Time   PHART 7.360 12/30/2017 0923   HCO3 19.6 (L) 12/30/2017 0923   TCO2 20 (L) 12/30/2017 1610   ACIDBASEDEF 6.0 (H) 12/30/2017 0923   O2SAT 98.0 12/30/2017 0923   CBG (last 3)  Recent Labs    12/31/17 1601 12/31/17 2206 01/01/18 0657  GLUCAP 114* 106* 95    Assessment/Plan: S/P Procedure(s)  (LRB): CORONARY ARTERY BYPASS GRAFTING (CABG) x4, LIMA to LAD, SVG to OM1 , SVG to OM2, SVG to Ramus. ENDOSCOPIC SAPHENOUS VEIN HARVEST. (N/A) AORTIC VALVE REPLACEMENT (AVR) 100mm INSPIRIS BY EDWARDS LIFESCIENCE. (N/A) TRANSESOPHAGEAL ECHOCARDIOGRAM (TEE) (N/A)   1. CV- NSR with rate in the 70s, BP well controlled. Continue Lopressor, Norvasc, and clonidine. Prn hydralazine. Goal BP is 093G Systolic. No ACEI/ARB due to renal dysfunction. 2. Pulm- tolerating room air with good oxygen saturation.  3. Renal-creatinine 1.84, electrolytes okay. Weight is trending down. Continue to avoid nephrotoxic agents 4. H and H has been stable, expected acute blood loss anemia.  5. Endo-blood glucose well controlled on current regimen.  6. Urology following- successful voiding trial  Plan: Home today.    LOS: 16 days    Elgie Collard 01/03/2018

## 2018-01-03 NOTE — Discharge Instructions (Signed)
Endoscopic Saphenous Vein Harvesting, Care After  Refer to this sheet in the next few weeks. These instructions provide you with information about caring for yourself after your procedure. Your health care provider may also give you more specific instructions. Your treatment has been planned according to current medical practices, but problems sometimes occur. Call your health care provider if you have any problems or questions after your procedure. What can I expect after the procedure? After the procedure, it is common to have:  Pain.  Bruising.  Swelling.  Numbness.  Follow these instructions at home: Medicine  Take over-the-counter and prescription medicines only as told by your health care provider.  Do not drive or operate heavy machinery while taking prescription pain medicine. Incision care   Follow instructions from your health care provider about how to take care of the cut made during surgery (incision). Make sure you: ? Wash your hands with soap and water before you change your bandage (dressing). If soap and water are not available, use hand sanitizer. ? Change your dressing as told by your health care provider. ? Leave stitches (sutures), skin glue, or adhesive strips in place. These skin closures may need to be in place for 2 weeks or longer. If adhesive strip edges start to loosen and curl up, you may trim the loose edges. Do not remove adhesive strips completely unless your health care provider tells you to do that.  Check your incision area every day for signs of infection. Check for: ? More redness, swelling, or pain. ? More fluid or blood. ? Warmth. ? Pus or a bad smell. General instructions  Raise (elevate) your legs above the level of your heart while you are sitting or lying down.  Do any exercises your health care providers have given you. These may include deep breathing, coughing, and walking exercises.  Do not shower, take baths, swim, or use a hot  tub unless told by your health care provider.  Wear your elastic stocking if told by your health care provider.  Keep all follow-up visits as told by your health care provider. This is important. Contact a health care provider if:  Medicine does not help your pain.  Your pain gets worse.  You have new leg bruises or your leg bruises get bigger.  You have a fever.  Your leg feels numb.  You have more redness, swelling, or pain around your incision.  You have more fluid or blood coming from your incision.  Your incision feels warm to the touch.  You have pus or a bad smell coming from your incision. Get help right away if:  Your pain is severe.  You develop pain, tenderness, warmth, redness, or swelling in any part of your leg.  You have chest pain.  You have trouble breathing. This information is not intended to replace advice given to you by your health care provider. Make sure you discuss any questions you have with your health care provider. Document Released: 01/01/2011 Document Revised: 09/27/2015 Document Reviewed: 03/05/2015 Elsevier Interactive Patient Education  2018 [place]. Coronary Artery Bypass Grafting, Care After These instructions give you information on caring for yourself after your procedure. Your doctor may also give you more specific instructions. Call your doctor if you have any problems or questions after your procedure. Follow these instructions at home:  Only take medicine as told by your doctor. Take medicines exactly as told. Do not stop taking medicines or start any new medicines without talking to your doctor  first.  Take your pulse as told by your doctor.  Do deep breathing as told by your doctor. Use your breathing device (incentive spirometer), if given, to practice deep breathing several times a day. Support your chest with a pillow or your arms when you take deep breaths or cough.  Keep the area clean, dry, and protected where the  surgery cuts (incisions) were made. Remove bandages (dressings) only as told by your doctor. If strips were applied to surgical area, do not take them off. They fall off on their own.  Check the surgery area daily for puffiness (swelling), redness, or leaking fluid.  If surgery cuts were made in your legs: ? Avoid crossing your legs. ? Avoid sitting for long periods of time. Change positions every 30 minutes. ? Raise your legs when you are sitting. Place them on pillows.  Wear stockings that help keep blood clots from forming in your legs (compression stockings).  Only take sponge baths until your doctor says it is okay to take showers. Pat the surgery area dry. Do not rub the surgery area with a washcloth or towel. Do not bathe, swim, or use a hot tub until your doctor says it is okay.  Eat foods that are high in fiber. These include raw fruits and vegetables, whole grains, beans, and nuts. Choose lean meats. Avoid canned, processed, and fried foods.  Drink enough fluids to keep your pee (urine) clear or pale yellow.  Weigh yourself every day.  Rest and limit activity as told by your doctor. You may be told to: ? Stop any activity if you have chest pain, shortness of breath, changes in heartbeat, or dizziness. Get help right away if this happens. ? Move around often for short amounts of time or take short walks as told by your doctor. Gradually become more active. You may need help to strengthen your muscles and build endurance. ? Avoid lifting, pushing, or pulling anything heavier than 10 pounds (4.5 kg) for at least 6 weeks after surgery.  Do not drive until your doctor says it is okay.  Ask your doctor when you can go back to work.  Ask your doctor when you can begin sexual activity again.  Follow up with your doctor as told. Contact a doctor if:  You have puffiness, redness, more pain, or fluid draining from the incision site.  You have a fever.  You have puffiness in your  ankles or legs.  You have pain in your legs.  You gain 2 or more pounds (0.9 kg) a day.  You feel sick to your stomach (nauseous) or throw up (vomit).  You have watery poop (diarrhea). Get help right away if:  You have chest pain that goes to your jaw or arms.  You have shortness of breath.  You have a fast or irregular heartbeat.  You notice a "clicking" in your breastbone when you move.  You have numbness or weakness in your arms or legs.  You feel dizzy or light-headed. This information is not intended to replace advice given to you by your health care provider. Make sure you discuss any questions you have with your health care provider. Document Released: 04/26/2013 Document Revised: 09/27/2015 Document Reviewed: 09/28/2012 Elsevier Interactive Patient Education  2017 [place] transurethral resection of the prostate (TURP) instructions  Your recent prostate surgery requires very special post hospital care. Despite the fact that no skin incisions were used the area around the prostate incision is quite raw and  is covered with a scab to promote healing and prevent bleeding. Certain cautions are needed to assure that the scab is not disturbed of the next 2-3 weeks while the healing proceeds.  Because the raw surface in your prostate and the irritating effects of urine you may expect frequency of urination and/or urgency (a stronger desire to urinate) and perhaps even getting up at night more often. This will usually resolve or improve slowly over the healing period. You may see some blood in your urine over the first 6 weeks. Do not be alarmed, even if the urine was clear for a while. Get off your feet and drink lots of fluids until clearing occurs. If you start to pass clots or don't improve call us.  Catheter: (If you are discharged with a catheter.) 1. Keep your catheter secured to your leg at all times with tape or the supplied strap. 2. You may experience leakage  of urine around your catheter- as long as the  catheter continues to drain, this is normal.  If your catheter stops draining  go to the ER. 3. You may also have blood in your urine, even after it has been clear for  several days; you may even pass some small blood clots or other material.  This  is normal as well.  If this happens, sit down and drink plenty of water to help  make urine to flush out your bladder.  If the blood in your urine becomes worse  after doing this, contact our office or return to the ER. 4. You may use the leg bag (small bag) during the day, but use the large bag at  night.  Diet:  You may return to your normal diet immediately. Because of the raw surface of your bladder, alcohol, spicy foods, foods high in acid and drinks with caffeine may cause irritation or frequency and should be used in moderation. To keep your urine flowing freely and avoid constipation, drink plenty of fluids during the day (8-10 glasses). Tip: Avoid cranberry juice because it is very acidic.  Activity:  Your physical activity doesn't need to be restricted. However, if you are very active, you may see some blood in the urine. We suggest that you reduce your activity under the circumstances until the bleeding has stopped.  Bowels:  It is important to keep your bowels regular during the postoperative period. Straining with bowel movements can cause bleeding. A bowel movement every other day is reasonable. Use a mild laxative if needed, such as milk of magnesia 2-3 tablespoons, or 2 Dulcolax tablets. Call if you continue to have problems. If you had been taking narcotics for pain, before, during or after your surgery, you may be constipated. Take a laxative if necessary.  Medication:  You should resume your pre-surgery medications unless told not to. DO NOT RESUME YOUR ASPIRIN, WARFARIN, OR OTHER BLOOD THINNER FOR 1 WEEK. In addition you may be given an antibiotic to prevent or treat infection.  Antibiotics are not always necessary. All medication should be taken as prescribed until the bottles are finished unless you are having an unusual reaction to one of the drugs.     Problems you should report to [place]:  a. Fever greater than 101F. b. Heavy bleeding, or clots (see notes above about blood in urine). c. Inability to urinate. d. Drug reactions (hives, rash, nausea, vomiting, diarrhea). e. Severe burning or pain with urination that is not improving.

## 2018-01-05 NOTE — Telephone Encounter (Signed)
Patient contacted regarding discharge from [facility] on 01/03/2018.    Patient understands to follow up with provider Dr. Harl Bowie on Tuesday, 01/19/2018 at 12:40 in [place] office.   Patient understands discharge instructions?  YES  Patient understands medications and regiment?  YES   Patient understands to bring all medications to this visit?  YES

## 2018-01-08 ENCOUNTER — Ambulatory Visit (INDEPENDENT_AMBULATORY_CARE_PROVIDER_SITE_OTHER): Payer: Self-pay | Admitting: *Deleted

## 2018-01-08 DIAGNOSIS — Z952 Presence of prosthetic heart valve: Secondary | ICD-10-CM

## 2018-01-08 DIAGNOSIS — Z951 Presence of aortocoronary bypass graft: Secondary | ICD-10-CM

## 2018-01-08 DIAGNOSIS — Z4802 Encounter for removal of sutures: Secondary | ICD-10-CM

## 2018-01-08 NOTE — Progress Notes (Addendum)
Mr. Pridgeon returns to the office s/p CABG X 4/AVReplacement on 12/29/17. He is doing well at home. Diet and bowels are good. He is walking as instructed. His sternal incision, right leg EVH site and chest tube sites are all well healed. The previous chest tube sutures were easily removed. He is not requiring any pain meds.  He will return as scheduled with a CXR. He does have a f/u visit with cardiology.

## 2018-01-19 ENCOUNTER — Ambulatory Visit: Payer: Medicare Other | Admitting: Cardiology

## 2018-01-20 ENCOUNTER — Encounter: Payer: Self-pay | Admitting: Cardiology

## 2018-01-20 ENCOUNTER — Ambulatory Visit (INDEPENDENT_AMBULATORY_CARE_PROVIDER_SITE_OTHER): Payer: Medicare Other | Admitting: Cardiology

## 2018-01-20 VITALS — BP 150/70 | HR 76 | Ht 68.0 in | Wt 194.0 lb

## 2018-01-20 DIAGNOSIS — I739 Peripheral vascular disease, unspecified: Secondary | ICD-10-CM | POA: Diagnosis not present

## 2018-01-20 DIAGNOSIS — I2510 Atherosclerotic heart disease of native coronary artery without angina pectoris: Secondary | ICD-10-CM | POA: Diagnosis not present

## 2018-01-20 DIAGNOSIS — I10 Essential (primary) hypertension: Secondary | ICD-10-CM | POA: Diagnosis not present

## 2018-01-20 DIAGNOSIS — Z952 Presence of prosthetic heart valve: Secondary | ICD-10-CM | POA: Diagnosis not present
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
## 2018-01-20 NOTE — Patient Instructions (Signed)
Medication Instructions:  DECREASE ASPIRIN TO 81 MG DAILY   Labwork: NONE  Testing/Procedures: NONE  Follow-Up: Your physician recommends that you schedule a follow-up appointment in: 4 MONTHS   Any Other Special Instructions Will Be Listed Below (If Applicable).     If you need a refill on your cardiac medications before your next appointment, please call your pharmacy.

## 2018-02-02 ENCOUNTER — Encounter: Payer: Self-pay | Admitting: Vascular Surgery

## 2018-02-02 ENCOUNTER — Other Ambulatory Visit: Payer: Self-pay

## 2018-02-02 ENCOUNTER — Ambulatory Visit (INDEPENDENT_AMBULATORY_CARE_PROVIDER_SITE_OTHER): Payer: Medicare Other | Admitting: Vascular Surgery

## 2018-02-02 ENCOUNTER — Other Ambulatory Visit: Payer: Self-pay | Admitting: Surgery

## 2018-02-02 VITALS — BP 144/75 | HR 66 | Temp 98.0°F | Resp 16 | Ht 68.0 in | Wt 190.0 lb

## 2018-02-02 DIAGNOSIS — I6521 Occlusion and stenosis of right carotid artery: Secondary | ICD-10-CM | POA: Diagnosis not present

## 2018-02-02 DIAGNOSIS — Z951 Presence of aortocoronary bypass graft: Secondary | ICD-10-CM

## 2018-02-02 NOTE — Progress Notes (Signed)
Vitals:   02/02/18 1541 02/02/18 1543  BP: 140/82 (!) 142/86  Pulse: 66 66  Resp: 16   Temp: 98 F (36.7 C)   TempSrc: Oral   SpO2: 98%   Weight: 190 lb (86.2 kg)   Height: 5\' 8"  (1.727 m)

## 2018-02-02 NOTE — Progress Notes (Signed)
Vascular and Vein Specialist of [facility]  Patient name: Matthew [name] MRN: [id number] DOB: January 07, 1946 Sex: male  REASON FOR VISIT: Follow-up of asymptomatic carotid disease  HPI: Matthew [name] is a 72 y.o. male very complex patient.  Known to me from prior left femoral to posterior tibial bypass for limb salvage.  He had undergone transurethral resection of prostate and had unstable coronary event following this and underwent emergent arctic valve replacement and coronary bypass.  This was approximately 6 weeks ago.  He has done well but is continued to slowly recovering from the big surgery.  Preoperative testing revealed critical right carotid stenosis.  It was felt best to continue observation of this versus behind surgery since he was having aortic valve replacement.  He remains asymptomatic from his carotid disease.  He also has a kidney lesion which is most likely kidney cancer and is being followed by [facility] for this.  Past Medical History:  Diagnosis Date  . Arthritis   . Bladder stones   . BPH (benign prostatic hyperplasia)   . CAD (coronary artery disease)    a. Pt reports prior stenting @ [facility] - no records in Epic.  Marland Kitchen Chronic kidney disease (CKD), stage III (moderate) (HCC)    dx from [place] in [place], [place]  . Heart murmur    a. AS  . Hyperlipidemia   . Hypertension   . Moderate to severe aortic stenosis    a. [facility]-16 [facility].  Marland Kitchen Neuropathy    FEET AND HANDS  . Peripheral vascular disease ([place])    a. 04/2016 s/p L Fem->PT bypass; b. 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 04/2017 LLE Duplex: No restenosis. ABI R 0.63, ABI L 1.01.  Marland Kitchen Post traumatic stress disorder (PTSD)   . Pre-diabetes     Family History  Problem Relation Age of Onset  . Leukemia Sister   . Lung cancer Brother   . Heart failure Mother        died @ 45  . Diabetes Father        died @ 75  . Heart attack Father     SOCIAL  HISTORY: Social History   Tobacco Use  . Smoking status: Former Smoker    Packs/day: 1.00    Years: 51.00    Pack years: 51.00    Types: Cigarettes    Start date: 06/15/1962    Last attempt to quit: 12/16/2017    Years since quitting: 0.1  . Smokeless tobacco: Never Used  Substance Use Topics  . Alcohol use: Never    Frequency: Never    No Known Allergies  Current Outpatient Medications  Medication Sig Dispense Refill  . acetaminophen (TYLENOL) 325 MG tablet Take 2 tablets (650 mg total) by mouth every 6 (six) hours as needed for mild pain.    Marland Kitchen amLODipine (NORVASC) 10 MG tablet Take 10 mg by mouth every morning. For heart     . aspirin EC 81 MG tablet Take 81 mg by mouth daily.    Marland Kitchen atorvastatin (LIPITOR) 80 MG tablet Take 1 tablet (80 mg total) by mouth daily at 6 PM. 30 tablet 1  . bismuth subsalicylate (PEPTO BISMOL) 262 MG/15ML suspension Take 30 mLs by mouth every 6 (six) hours as needed for indigestion or diarrhea or loose stools.    . cloNIDine (CATAPRES) 0.1 MG tablet Take 1 tablet (0.1 mg total) by mouth 2 (two) times daily. 60 tablet 11  .  ferrous sulfate 325 (65 FE) MG tablet Take 325 mg by mouth every Monday, Wednesday, and Friday.     . gabapentin (NEURONTIN) 600 MG tablet Take 600 mg by mouth 3 (three) times daily. For peripheral neuropathy     . latanoprost (XALATAN) 0.005 % ophthalmic solution Place 1 drop into both eyes at bedtime.     . metoprolol tartrate (LOPRESSOR) 100 MG tablet Take 1 tablet (100 mg total) by mouth 2 (two) times daily. 60 tablet 1  . Omega-3 Fatty Acids (OMEGA-3 FISH OIL) 300 MG CAPS Take 300 mg by mouth 2 (two) times daily.    Marland Kitchen oxybutynin (DITROPAN) 5 MG tablet Take 1 tablet (5 mg total) by mouth every 8 (eight) hours as needed for bladder spasms. 30 tablet 1  . oxyCODONE (OXY IR/ROXICODONE) 5 MG immediate release tablet Take 1 tablet (5 mg total) by mouth every 6 (six) hours as needed for severe pain. 30 tablet 0  . phenazopyridine  (PYRIDIUM) 200 MG tablet Take 1 tablet (200 mg total) by mouth 3 (three) times daily as needed (for pain with urination). 30 tablet 0  . potassium citrate (UROCIT-K) 10 MEQ (1080 MG) SR tablet Take 10 mEq by mouth 2 (two) times daily.     No current facility-administered medications for this visit.     REVIEW OF SYSTEMS:  [X]  denotes positive finding, [ ]  denotes negative finding Cardiac  Comments:  Chest pain or chest pressure:    Shortness of breath upon exertion: x   Short of breath when lying flat:    Irregular heart rhythm:        Vascular    Pain in calf, thigh, or hip brought on by ambulation:    Pain in feet at night that wakes you up from your sleep:     Blood clot in your veins:    Leg swelling:           PHYSICAL EXAM: Vitals:   02/02/18 1541 02/02/18 1543 02/02/18 1544  BP: 140/82 (!) 142/86 (!) 144/75  Pulse: 66 66 66  Resp: 16    Temp: 98 F (36.7 C)    TempSrc: Oral    SpO2: 98%    Weight: 190 lb (86.2 kg)    Height: 5\' 8"  (1.727 m)      GENERAL: The patient is a well-nourished male, in no acute distress. The vital signs are documented above. CARDIOVASCULAR: Does have an easily palpable left femoral to posterior tibial in situ graft pulse.  Carotid arteries are without bruits bilaterally PULMONARY: There is good air exchange  MUSCULOSKELETAL: There are no major deformities or cyanosis. NEUROLOGIC: No focal weakness or paresthesias are detected. SKIN: There are no ulcers or rashes noted. PSYCHIATRIC: The patient has a normal affect.  DATA:  None new.  Prior duplex showed critical stenosis in his right carotid and noncontrast showed calcified plaque at the angle of the jaw  MEDICAL ISSUES: Severe asymptomatic carotid stenosis.  I have discussed timing of surgery with the patient.  Still is not recovered from his recent major cardiac surgery.  Also has ongoing evaluation of his kidney lesion felt to probably be cancer.  I have recommended that I see him  again in 2 months for continued discussion well hopefully he will be fully recovered from his recent surgery and issues regarding his kidney lesion will be more clarified.  I reviewed symptoms of carotid disease with the patient and a knows to report immediately to the hospital for evaluation  should this occur.  Otherwise we will see him in 2 months.  He was scheduled at this time for his routine surveillance of his left femoral to posterior tibial bypass.    [name], MD FACS [facility] Tel [phone] Pager [phone]

## 2018-02-03 ENCOUNTER — Encounter: Payer: Self-pay | Admitting: Surgery

## 2018-02-03 ENCOUNTER — Ambulatory Visit (INDEPENDENT_AMBULATORY_CARE_PROVIDER_SITE_OTHER): Payer: Self-pay | Admitting: Surgery

## 2018-02-03 ENCOUNTER — Ambulatory Visit
Admission: RE | Admit: 2018-02-03 | Discharge: 2018-02-03 | Disposition: A | Discharge status: Home / Self Care | Payer: Medicare Other | Source: Ambulatory Visit | Attending: Surgery | Admitting: Surgery

## 2018-02-03 VITALS — BP 124/76 | HR 65 | Resp 18 | Ht 68.0 in | Wt 191.0 lb

## 2018-02-03 DIAGNOSIS — Z951 Presence of aortocoronary bypass graft: Secondary | ICD-10-CM

## 2018-02-03 NOTE — Progress Notes (Signed)
HPI: Patient returns for routine postoperative follow-up having undergone coronary bypass graft surgery x4 and aortic valve replacement using a pericardial valve on 12/29/2017. The patient's early postoperative recovery while in the hospital was notable for an uncomplicated postoperative course. Since hospital discharge the patient reports that he has been feeling well overall.  He is walking daily with improvement in his stamina.  He denies any chest pain or shortness of breath.  His only complaint is of some tiredness which his wife feels is related to the twice daily clonidine that he is on.  This was started postoperatively due to hypertension requiring multiple medications.   Current Outpatient Medications  Medication Sig Dispense Refill  . acetaminophen (TYLENOL) 325 MG tablet Take 2 tablets (650 mg total) by mouth every 6 (six) hours as needed for mild pain.    Marland Kitchen amLODipine (NORVASC) 10 MG tablet Take 10 mg by mouth every morning. For heart     . aspirin EC 81 MG tablet Take 81 mg by mouth daily.    Marland Kitchen atorvastatin (LIPITOR) 80 MG tablet Take 1 tablet (80 mg total) by mouth daily at 6 PM. 30 tablet 1  . bismuth subsalicylate (PEPTO BISMOL) 262 MG/15ML suspension Take 30 mLs by mouth every 6 (six) hours as needed for indigestion or diarrhea or loose stools.    . cloNIDine (CATAPRES) 0.1 MG tablet Take 1 tablet (0.1 mg total) by mouth 2 (two) times daily. 60 tablet 11  . ferrous sulfate 325 (65 FE) MG tablet Take 325 mg by mouth every Monday, Wednesday, and Friday.     . gabapentin (NEURONTIN) 600 MG tablet Take 600 mg by mouth 3 (three) times daily. For peripheral neuropathy     . latanoprost (XALATAN) 0.005 % ophthalmic solution Place 1 drop into both eyes at bedtime.     . metoprolol tartrate (LOPRESSOR) 100 MG tablet Take 1 tablet (100 mg total) by mouth 2 (two) times daily. 60 tablet 1  . Omega-3 Fatty Acids (OMEGA-3 FISH OIL) 300 MG CAPS Take 300 mg by mouth 2 (two) times daily.      Marland Kitchen oxybutynin (DITROPAN) 5 MG tablet Take 1 tablet (5 mg total) by mouth every 8 (eight) hours as needed for bladder spasms. 30 tablet 1  . oxyCODONE (OXY IR/ROXICODONE) 5 MG immediate release tablet Take 1 tablet (5 mg total) by mouth every 6 (six) hours as needed for severe pain. 30 tablet 0  . phenazopyridine (PYRIDIUM) 200 MG tablet Take 1 tablet (200 mg total) by mouth 3 (three) times daily as needed (for pain with urination). 30 tablet 0  . potassium citrate (UROCIT-K) 10 MEQ (1080 MG) SR tablet Take 10 mEq by mouth 2 (two) times daily.     No current facility-administered medications for this visit.     Physical Exam: BP 124/76 (BP Location: Left Arm, Patient Position: Sitting, Cuff Size: Normal)   Pulse 65   Resp 18   Ht 5\' 8"  (1.727 m)   Wt 191 lb (86.6 kg)   SpO2 97% Comment: RA  BMI 29.04 kg/m  He looks well. Cardiac exam shows a regular rate and rhythm with normal heart sounds.  There is no murmur. Lung exam is clear. Chest incision is healing well and sternum is stable. His right leg incision is healing well and there is no peripheral edema.  Diagnostic Tests:  CLINICAL DATA:  Status post coronary artery bypass graft and aortic valve replacement.  EXAM: CHEST - 2 VIEW  COMPARISON:  Radiograph of December 31, 2017.  FINDINGS: The heart size and mediastinal contours are within normal limits. Sternotomy wires are noted. No pneumothorax or pleural effusion is noted. Both lungs are clear. The visualized skeletal structures are unremarkable.  IMPRESSION: No active cardiopulmonary disease.   Electronically Signed   By: Marijo Conception, M.D.   On: 02/03/2018 11:56   Impression:  Overall he is making a good recovery following his surgery.  I encouraged him to continue to increase the distance that he is walking.  He is planning on participating in cardiac rehab.  I asked him not to lift anything heavier than 10 pounds for 3 months postoperatively.  He can  return to driving a car.  His blood pressure appears under adequate control and asked him to decrease the clonidine to 0.1 mg nightly which may decrease his daytime drowsiness.  I suspect that he may be able to get off of the clonidine altogether if his blood pressure remains stable on once daily dosing.  He is going to see Dr. Donnetta Hutching in the near future for follow-up of a high-grade right internal carotid artery stenosis.  Plan:  He will continue to follow-up with his PCP and Dr. Harl Bowie for his cardiology care.  He will return to see me if he has any problems with his incisions.  He is going to follow-up with Dr. Sherren Mocha Early concerning his right internal carotid artery stenosis.   [name], MD [facility] [phone]

## 2018-02-23 ENCOUNTER — Telehealth: Payer: Self-pay | Admitting: Cardiology

## 2018-02-23 NOTE — Telephone Encounter (Signed)
PER wife [facility] will not approve for him to come visit cardiologist Dr branch wants him to go to their cardiologist in [place]

## 2018-04-05 ENCOUNTER — Encounter (HOSPITAL_COMMUNITY): Payer: No Typology Code available for payment source

## 2018-04-05 ENCOUNTER — Ambulatory Visit: Payer: No Typology Code available for payment source | Admitting: Family

## 2018-04-13 ENCOUNTER — Telehealth: Payer: Self-pay | Admitting: *Deleted

## 2018-04-13 ENCOUNTER — Other Ambulatory Visit: Payer: Self-pay | Admitting: *Deleted

## 2018-04-13 ENCOUNTER — Encounter: Payer: Self-pay | Admitting: *Deleted

## 2018-04-13 ENCOUNTER — Other Ambulatory Visit: Payer: Self-pay

## 2018-04-13 ENCOUNTER — Ambulatory Visit (HOSPITAL_COMMUNITY)
Admission: RE | Admit: 2018-04-13 | Discharge: 2018-04-13 | Disposition: A | Discharge status: Home / Self Care | Payer: No Typology Code available for payment source | Source: Ambulatory Visit | Attending: Family | Admitting: Family

## 2018-04-13 ENCOUNTER — Encounter: Payer: Self-pay | Admitting: Vascular Surgery

## 2018-04-13 ENCOUNTER — Ambulatory Visit (INDEPENDENT_AMBULATORY_CARE_PROVIDER_SITE_OTHER): Payer: No Typology Code available for payment source | Admitting: Vascular Surgery

## 2018-04-13 VITALS — BP 154/82 | HR 70 | Temp 97.0°F | Resp 18 | Ht 68.0 in | Wt 202.0 lb

## 2018-04-13 DIAGNOSIS — I779 Disorder of arteries and arterioles, unspecified: Secondary | ICD-10-CM | POA: Insufficient documentation

## 2018-04-13 DIAGNOSIS — I739 Peripheral vascular disease, unspecified: Secondary | ICD-10-CM | POA: Insufficient documentation

## 2018-04-13 DIAGNOSIS — I70245 Atherosclerosis of native arteries of left leg with ulceration of other part of foot: Secondary | ICD-10-CM | POA: Insufficient documentation

## 2018-04-13 DIAGNOSIS — I6521 Occlusion and stenosis of right carotid artery: Secondary | ICD-10-CM | POA: Diagnosis not present

## 2018-04-13 NOTE — Telephone Encounter (Signed)
Request for Surgical Clearance  1. What type of surgery is being performed? RIGHT CAROTID ENDARTERECTOMY    2. When is this surgery scheduled? 05/12/2018  3. What type of clearance is required (medical clearance vs. Pharmacy clearance to hold med vs. Both)? CARDIAC CLEARANCE    4. Are there any medications that need to be held prior to surgery and how long?      5. Practice name and name of physician performing surgery?   DR. TODD EAR[facility]     6.  What is your office phone number? [phone]    7. What is your office fax number? (Be sure to include anyone who it needs to go Attn to) [facility] [phone]    8. Anesthesia type (None, local, MAC, general)? GENERAL   REMINDER TO USER: Remember to please route this message to P CV DIV PREOP in a phone note.

## 2018-04-13 NOTE — Progress Notes (Signed)
Vascular and Vein Specialist of [facility]  Patient name: Matthew [name] MRN: [id number] DOB: December 23, 1945 Sex: male  REASON FOR VISIT: Discuss timing of right carotid endarterectomy and also follow-up left femoral to posterior tibial bypass  HPI: Matthew [name] is a 72 y.o. male here today with his wife.  He reports that he continues to regain stamina after aortic valve surgery.  He does have known critical right carotid stenosis and we were deferring treatment until he had recovered from his open heart surgery.  He reports that he does feel comfortable proceeding at this point.  Fortunately he has had no neurologic deficits.  He also reports no lower extremity claudication symptoms.  Past Medical History:  Diagnosis Date  . Arthritis   . Bladder stones   . BPH (benign prostatic hyperplasia)   . CAD (coronary artery disease)    a. Pt reports prior stenting @ [facility] - no records in Epic.  Marland Kitchen Chronic kidney disease (CKD), stage III (moderate) (HCC)    dx from [place] in [place], [place]  . Heart murmur    a. AS  . Hyperlipidemia   . Hypertension   . Moderate to severe aortic stenosis    a. [facility]-16 [facility] [place].  Marland Kitchen Neuropathy    FEET AND HANDS  . Peripheral vascular disease ([place])    a. 04/2016 s/p L Fem->PT bypass; b. 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 04/2017 LLE Duplex: No restenosis. ABI R 0.63, ABI L 1.01.  Marland Kitchen Post traumatic stress disorder (PTSD)   . Pre-diabetes     Family History  Problem Relation Age of Onset  . Leukemia Sister   . Lung cancer Brother   . Heart failure Mother        died @ 60  . Diabetes Father        died @ 30  . Heart attack Father     SOCIAL HISTORY: Social History   Tobacco Use  . Smoking status: Former Smoker    Packs/day: 1.00    Years: 51.00    Pack years: 51.00    Types: Cigarettes    Start date: 06/15/1962    Last attempt to quit: 12/16/2017    Years since quitting: 0.3  . Smokeless  tobacco: Never Used  Substance Use Topics  . Alcohol use: Never    Frequency: Never    No Known Allergies  Current Outpatient Medications  Medication Sig Dispense Refill  . acetaminophen (TYLENOL) 325 MG tablet Take 2 tablets (650 mg total) by mouth every 6 (six) hours as needed for mild pain.    Marland Kitchen amLODipine (NORVASC) 10 MG tablet Take 10 mg by mouth every morning. For heart     . aspirin EC 81 MG tablet Take 81 mg by mouth daily.    Marland Kitchen atorvastatin (LIPITOR) 80 MG tablet Take 1 tablet (80 mg total) by mouth daily at 6 PM. 30 tablet 1  . bismuth subsalicylate (PEPTO BISMOL) 262 MG/15ML suspension Take 30 mLs by mouth every 6 (six) hours as needed for indigestion or diarrhea or loose stools.    . cloNIDine (CATAPRES) 0.1 MG tablet Take 1 tablet (0.1 mg total) by mouth 2 (two) times daily. 60 tablet 11  . ferrous sulfate 325 (65 FE) MG tablet Take 325 mg by mouth every Monday, Wednesday, and Friday.     . gabapentin (NEURONTIN) 600 MG tablet Take 600 mg by mouth 3 (three) times daily. For peripheral neuropathy     .  latanoprost (XALATAN) 0.005 % ophthalmic solution Place 1 drop into both eyes at bedtime.     . metoprolol tartrate (LOPRESSOR) 100 MG tablet Take 1 tablet (100 mg total) by mouth 2 (two) times daily. 60 tablet 1  . Omega-3 Fatty Acids (OMEGA-3 FISH OIL) 300 MG CAPS Take 300 mg by mouth 2 (two) times daily.    Marland Kitchen oxybutynin (DITROPAN) 5 MG tablet Take 1 tablet (5 mg total) by mouth every 8 (eight) hours as needed for bladder spasms. 30 tablet 1  . oxyCODONE (OXY IR/ROXICODONE) 5 MG immediate release tablet Take 1 tablet (5 mg total) by mouth every 6 (six) hours as needed for severe pain. 30 tablet 0  . phenazopyridine (PYRIDIUM) 200 MG tablet Take 1 tablet (200 mg total) by mouth 3 (three) times daily as needed (for pain with urination). 30 tablet 0  . potassium citrate (UROCIT-K) 10 MEQ (1080 MG) SR tablet Take 10 mEq by mouth 2 (two) times daily.    . cephALEXin (KEFLEX) 500 MG  capsule Take 500 mg by mouth 2 (two) times daily.  0   No current facility-administered medications for this visit.     REVIEW OF SYSTEMS:  [X]  denotes positive finding, [ ]  denotes negative finding Cardiac  Comments:  Chest pain or chest pressure:    Shortness of breath upon exertion:    Short of breath when lying flat:    Irregular heart rhythm:        Vascular    Pain in calf, thigh, or hip brought on by ambulation:    Pain in feet at night that wakes you up from your sleep:     Blood clot in your veins:    Leg swelling:           PHYSICAL EXAM: Vitals:   04/13/18 1034  BP: (!) 154/82  Pulse: 70  Resp: 18  Temp: (!) 97 F (36.1 C)  TempSrc: Oral  SpO2: 99%  Weight: 202 lb (91.6 kg)  Height: 5\' 8"  (1.727 m)    GENERAL: The patient is a well-nourished male, in no acute distress. The vital signs are documented above. CARDIOVASCULAR: Carotid arteries without bruits bilaterally.  2+ radial pulses.  Easily palpable left posterior tibial graft pulse in the subcutaneous area in the proximal calf PULMONARY: There is good air exchange  MUSCULOSKELETAL: There are no major deformities or cyanosis. NEUROLOGIC: No focal weakness or paresthesias are detected. SKIN: There are no ulcers or rashes noted. PSYCHIATRIC: The patient has a normal affect.  DATA:  Prior duplex showing critical right carotid stenosis.  Noncontrast CT of the neck showed calcification at the angle of the jaw but not distally.  MEDICAL ISSUES: Patient feels he is recovered to the point of being ready to proceed with elective right carotid endarterectomy.  I again the procedure and risk to include 1 to 2% risk of stroke with surgery.  Expected one night hospitalization.  They wish to proceed after the first of the year and we will schedule this at their convenience    [name], MD [facility] Vascular and Vein Specialists of [facility] Tel [phone] Pager [phone]

## 2018-04-13 NOTE — H&P (View-Only) (Signed)
Vascular and Vein Specialist of [facility]  Patient name: Matthew [name] MRN: [id number] DOB: 04/13/1946 Sex: male  REASON FOR VISIT: Discuss timing of right carotid endarterectomy and also follow-up left femoral to posterior tibial bypass  HPI: Matthew [name] is a 72 y.o. male here today with his wife.  He reports that he continues to regain stamina after aortic valve surgery.  He does have known critical right carotid stenosis and we were deferring treatment until he had recovered from his open heart surgery.  He reports that he does feel comfortable proceeding at this point.  Fortunately he has had no neurologic deficits.  He also reports no lower extremity claudication symptoms.  Past Medical History:  Diagnosis Date  . Arthritis   . Bladder stones   . BPH (benign prostatic hyperplasia)   . CAD (coronary artery disease)    a. Pt reports prior stenting @ [facility] - no records in Epic.  Marland Kitchen Chronic kidney disease (CKD), stage III (moderate) (HCC)    dx from [place] in [place]  . Heart murmur    a. AS  . Hyperlipidemia   . Hypertension   . Moderate to severe aortic stenosis    a. [facility] [place].  Marland Kitchen Neuropathy    FEET AND HANDS  . Peripheral vascular disease ([place])    a. 04/2016 s/p L Fem->PT bypass; b. 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 04/2017 LLE Duplex: No restenosis. ABI R 0.63, ABI L 1.01.  Marland Kitchen Post traumatic stress disorder (PTSD)   . Pre-diabetes     Family History  Problem Relation Age of Onset  . Leukemia Sister   . Lung cancer Brother   . Heart failure Mother        died @ 65  . Diabetes Father        died @ 15  . Heart attack Father     SOCIAL HISTORY: Social History   Tobacco Use  . Smoking status: Former Smoker    Packs/day: 1.00    Years: 51.00    Pack years: 51.00    Types: Cigarettes    Start date: 06/15/1962    Last attempt to quit: 12/16/2017    Years since quitting: 0.3  . Smokeless  tobacco: Never Used  Substance Use Topics  . Alcohol use: Never    Frequency: Never    No Known Allergies  Current Outpatient Medications  Medication Sig Dispense Refill  . acetaminophen (TYLENOL) 325 MG tablet Take 2 tablets (650 mg total) by mouth every 6 (six) hours as needed for mild pain.    Marland Kitchen amLODipine (NORVASC) 10 MG tablet Take 10 mg by mouth every morning. For heart     . aspirin EC 81 MG tablet Take 81 mg by mouth daily.    Marland Kitchen atorvastatin (LIPITOR) 80 MG tablet Take 1 tablet (80 mg total) by mouth daily at 6 PM. 30 tablet 1  . bismuth subsalicylate (PEPTO BISMOL) 262 MG/15ML suspension Take 30 mLs by mouth every 6 (six) hours as needed for indigestion or diarrhea or loose stools.    . cloNIDine (CATAPRES) 0.1 MG tablet Take 1 tablet (0.1 mg total) by mouth 2 (two) times daily. 60 tablet 11  . ferrous sulfate 325 (65 FE) MG tablet Take 325 mg by mouth every Monday, Wednesday, and Friday.     . gabapentin (NEURONTIN) 600 MG tablet Take 600 mg by mouth 3 (three) times daily. For peripheral neuropathy     .  latanoprost (XALATAN) 0.005 % ophthalmic solution Place 1 drop into both eyes at bedtime.     . metoprolol tartrate (LOPRESSOR) 100 MG tablet Take 1 tablet (100 mg total) by mouth 2 (two) times daily. 60 tablet 1  . Omega-3 Fatty Acids (OMEGA-3 FISH OIL) 300 MG CAPS Take 300 mg by mouth 2 (two) times daily.    Marland Kitchen oxybutynin (DITROPAN) 5 MG tablet Take 1 tablet (5 mg total) by mouth every 8 (eight) hours as needed for bladder spasms. 30 tablet 1  . oxyCODONE (OXY IR/ROXICODONE) 5 MG immediate release tablet Take 1 tablet (5 mg total) by mouth every 6 (six) hours as needed for severe pain. 30 tablet 0  . phenazopyridine (PYRIDIUM) 200 MG tablet Take 1 tablet (200 mg total) by mouth 3 (three) times daily as needed (for pain with urination). 30 tablet 0  . potassium citrate (UROCIT-K) 10 MEQ (1080 MG) SR tablet Take 10 mEq by mouth 2 (two) times daily.    . cephALEXin (KEFLEX) 500 MG  capsule Take 500 mg by mouth 2 (two) times daily.  0   No current facility-administered medications for this visit.     REVIEW OF SYSTEMS:  [X]  denotes positive finding, [ ]  denotes negative finding Cardiac  Comments:  Chest pain or chest pressure:    Shortness of breath upon exertion:    Short of breath when lying flat:    Irregular heart rhythm:        Vascular    Pain in calf, thigh, or hip brought on by ambulation:    Pain in feet at night that wakes you up from your sleep:     Blood clot in your veins:    Leg swelling:           PHYSICAL EXAM: Vitals:   04/13/18 1034  BP: (!) 154/82  Pulse: 70  Resp: 18  Temp: (!) 97 F (36.1 C)  TempSrc: Oral  SpO2: 99%  Weight: 202 lb (91.6 kg)  Height: 5\' 8"  (1.727 m)    GENERAL: The patient is a well-nourished male, in no acute distress. The vital signs are documented above. CARDIOVASCULAR: Carotid arteries without bruits bilaterally.  2+ radial pulses.  Easily palpable left posterior tibial graft pulse in the subcutaneous area in the proximal calf PULMONARY: There is good air exchange  MUSCULOSKELETAL: There are no major deformities or cyanosis. NEUROLOGIC: No focal weakness or paresthesias are detected. SKIN: There are no ulcers or rashes noted. PSYCHIATRIC: The patient has a normal affect.  DATA:  Prior duplex showing critical right carotid stenosis.  Noncontrast CT of the neck showed calcification at the angle of the jaw but not distally.  MEDICAL ISSUES: Patient feels he is recovered to the point of being ready to proceed with elective right carotid endarterectomy.  I again the procedure and risk to include 1 to 2% risk of stroke with surgery.  Expected one night hospitalization.  They wish to proceed after the first of the year and we will schedule this at their convenience    [name], MD [facility] Vascular and Vein Specialists of [facility] Tel [phone] Pager [phone]

## 2018-04-16 NOTE — Telephone Encounter (Signed)
I have left message for pt to call back concerning his upcoming surgery.

## 2018-04-20 ENCOUNTER — Encounter (HOSPITAL_COMMUNITY): Payer: No Typology Code available for payment source

## 2018-04-20 ENCOUNTER — Ambulatory Visit: Payer: No Typology Code available for payment source | Admitting: Family

## 2018-04-20 NOTE — Telephone Encounter (Signed)
Left message for pt to call back  °

## 2018-04-21 NOTE — Telephone Encounter (Signed)
Patient is followed in [place] office. Our preop team only currently covers [place] and [facility] patients. We do not have adequate staffing as of yet to even be able to handle these two offices, so satellite offices are asked to continue to cover their own the way they have. It is my understanding satellite coverage will be opening up in 06/2018. Will route to Dr. Harl Bowie to review and handle and will remove from APP box. Dayna Dunn PA-C

## 2018-04-22 ENCOUNTER — Telehealth: Payer: Self-pay

## 2018-04-22 NOTE — Telephone Encounter (Signed)
Patient's wife contacted our office stating she needed Cardiac Clearance for upcoming surgery, Carotid Endarterectomy with Dr. Donnetta Hutching.  I advised her that we do not do clearance and that she would need to contact the patient's Cardiologist.  She stated that the [facility] would not let him go see Dr. Harl Bowie whom he has seen in the past because they have Cardiology.  I advised her to contact the [facility] and get an appointment with one of their Cardiologist to get Cardiac Clearance before his upcoming surgery.  She acknowledged receipt and thanked me for the call.

## 2018-04-26 NOTE — Telephone Encounter (Signed)
Preop message received. From cardiac standpoint ok to proceed with surgery as planned, no specific cardiac testing required prior.   Carlyle Dolly MD

## 2018-04-30 NOTE — Pre-Procedure Instructions (Signed)
Matthew [name]  04/30/2018      [facility] [address] [place]  [address] Phone: [phone] Fax: [phone]    Your procedure is scheduled on Wed., Jan. 8, 2020 from 10:45AM-1:23PM  Report to [facility] Admitting Entrance "A" at 8:45AM  Call this number if you have problems the morning of surgery:  [phone]   Remember:  Do not eat or drink after midnight on Jan. 7th   Take ONLY these medicines the morning of surgery with A SIP OF WATER: AmLODipine (NORVASC), CloNIDine (CATAPRES), Gabapentin (NEURONTIN), and  Metoprolol tartrate (LOPRESSOR)  If needed: OxyCODONE (OXY IR/ROXICODONE)  Take Aspirin until (05/11/18) the day before surgery.  7 days before surgery (05/05/18), stop taking all Other Aspirin Products, Vitamins, Fish oils, and Herbal medications. Also stop all NSAIDS i.e. Advil, Ibuprofen, Motrin, Aleve, Anaprox, Naproxen, BC, Goody Powders, and all Supplements.    Do not wear jewelry.  Do not wear lotions, powders, colognes, or deodorant.  Do not shave 48 hours prior to surgery.  Men may shave face.  Do not bring valuables to the hospital.  [facility] is not responsible for any belongings or valuables.  Contacts, dentures or bridgework may not be worn into surgery.  Leave your suitcase in the car.  After surgery it may be brought to your room.  For patients admitted to the hospital, discharge time will be determined by your treatment team.  Patients discharged the day of surgery will not be allowed to drive home.   Special instructions:   [place]- Preparing For Surgery  Before surgery, you can play an important role. Because skin is not sterile, your skin needs to be as free of germs as possible. You can reduce the number of germs on your skin by washing with CHG (chlorahexidine gluconate) Soap before surgery.  CHG is an antiseptic cleaner which kills germs and bonds with the skin to continue killing  germs even after washing.    Oral Hygiene is also important to reduce your risk of infection.  Remember - BRUSH YOUR TEETH THE MORNING OF SURGERY WITH YOUR REGULAR TOOTHPASTE  Please do not use if you have an allergy to CHG or antibacterial soaps. If your skin becomes reddened/irritated stop using the CHG.  Do not shave (including legs and underarms) for at least 48 hours prior to first CHG shower. It is OK to shave your face.  Please follow these instructions carefully.   1. Shower the NIGHT BEFORE SURGERY and the MORNING OF SURGERY with CHG.   2. If you chose to wash your hair, wash your hair first as usual with your normal shampoo.  3. After you shampoo, rinse your hair and body thoroughly to remove the shampoo.  4. Use CHG as you would any other liquid soap. You can apply CHG directly to the skin and wash gently with a scrungie or a clean washcloth.   5. Apply the CHG Soap to your body ONLY FROM THE NECK DOWN.  Do not use on open wounds or open sores. Avoid contact with your eyes, ears, mouth and genitals (private parts). Wash Face and genitals (private parts)  with your normal soap.  6. Wash thoroughly, paying special attention to the area where your surgery will be performed.  7. Thoroughly rinse your body with warm water from the neck down.  8. DO NOT shower/wash with your normal soap after using and rinsing off  the CHG Soap.  9. Pat yourself dry with a CLEAN TOWEL.  10. Wear CLEAN PAJAMAS to bed the night before surgery, wear comfortable clothes the morning of surgery  11. Place CLEAN SHEETS on your bed the night of your first shower and DO NOT SLEEP WITH PETS.  Day of Surgery:  Do not apply any deodorants/lotions.  Please wear clean clothes to the hospital/surgery center.   Remember to brush your teeth WITH YOUR REGULAR TOOTHPASTE.  Please read over the following fact sheets that you were given. Pain Booklet, Coughing and Deep Breathing, MRSA Information and Surgical  Site Infection Prevention

## 2018-05-03 ENCOUNTER — Other Ambulatory Visit: Payer: Self-pay

## 2018-05-03 ENCOUNTER — Encounter (HOSPITAL_COMMUNITY): Payer: Self-pay

## 2018-05-03 ENCOUNTER — Encounter (HOSPITAL_COMMUNITY)
Admission: RE | Admit: 2018-05-03 | Discharge: 2018-05-03 | Disposition: A | Discharge status: Home / Self Care | Payer: No Typology Code available for payment source | Source: Ambulatory Visit | Attending: Vascular Surgery | Admitting: Vascular Surgery

## 2018-05-03 DIAGNOSIS — Z01812 Encounter for preprocedural laboratory examination: Secondary | ICD-10-CM | POA: Insufficient documentation

## 2018-05-03 HISTORY — DX: Sleep apnea, unspecified: G47.30

## 2018-05-03 LAB — URINALYSIS, ROUTINE W REFLEX MICROSCOPIC
BACTERIA UA: NONE SEEN
BILIRUBIN URINE: NEGATIVE
Glucose, UA: NEGATIVE mg/dL
HGB URINE DIPSTICK: NEGATIVE
Ketones, ur: NEGATIVE mg/dL
LEUKOCYTES UA: NEGATIVE
NITRITE: NEGATIVE
Protein, ur: 100 mg/dL — AB
SPECIFIC GRAVITY, URINE: 1.02 (ref 1.005–1.030)
pH: 5 (ref 5.0–8.0)

## 2018-05-03 LAB — COMPREHENSIVE METABOLIC PANEL
ALBUMIN: 3.8 g/dL (ref 3.5–5.0)
ALT: 16 U/L (ref 0–44)
AST: 19 U/L (ref 15–41)
Alkaline Phosphatase: 64 U/L (ref 38–126)
Anion gap: 12 (ref 5–15)
BUN: 22 mg/dL (ref 8–23)
CHLORIDE: 108 mmol/L (ref 98–111)
CO2: 20 mmol/L — AB (ref 22–32)
CREATININE: 2.4 mg/dL — AB (ref 0.61–1.24)
Calcium: 8.9 mg/dL (ref 8.9–10.3)
GFR calc Af Amer: 30 mL/min — ABNORMAL LOW (ref >60)
GFR calc non Af Amer: 26 mL/min — ABNORMAL LOW (ref >60)
GLUCOSE: 144 mg/dL — AB (ref 70–99)
Potassium: 4.6 mmol/L (ref 3.5–5.1)
SODIUM: 140 mmol/L (ref 135–145)
Total Bilirubin: 0.2 mg/dL — ABNORMAL LOW (ref 0.3–1.2)
Total Protein: 6.8 g/dL (ref 6.5–8.1)

## 2018-05-03 LAB — CBC
HCT: 36.4 % — ABNORMAL LOW (ref 39.0–52.0)
Hemoglobin: 11.9 g/dL — ABNORMAL LOW (ref 13.0–17.0)
MCH: 30.7 pg (ref 26.0–34.0)
MCHC: 32.7 g/dL (ref 30.0–36.0)
MCV: 93.8 fL (ref 80.0–100.0)
NRBC: 0 % (ref 0.0–0.2)
PLATELETS: 213 10*3/uL (ref 150–400)
RBC: 3.88 MIL/uL — AB (ref 4.22–5.81)
RDW: 12.7 % (ref 11.5–15.5)
WBC: 5.1 10*3/uL (ref 4.0–10.5)

## 2018-05-03 LAB — SURGICAL PCR SCREEN
MRSA, PCR: NEGATIVE
STAPHYLOCOCCUS AUREUS: NEGATIVE

## 2018-05-03 LAB — PROTIME-INR
INR: 0.99
Prothrombin Time: 13 seconds (ref 11.4–15.2)

## 2018-05-03 LAB — APTT: APTT: 28 s (ref 24–36)

## 2018-05-03 LAB — TYPE AND SCREEN
ABO/RH(D): AB NEG
Antibody Screen: NEGATIVE

## 2018-05-03 NOTE — Progress Notes (Signed)
[facility] Herold [facility]  Cardiologist - Dr. Harl Bowie- [facility] 04/26/18 (E)  Chest x-ray - 02/03/18 (E)  EKG - 12/31/17 (E)  Stress Test - Denies  ECHO - 12/29/17 (E)  Cardiac Cath - 12/29/17 (E)  AICD- na PM- na LOOP- na  Sleep Study - Yes- Positive CPAP - None  LABS- 05/03/18: CBC, CMP, PT, PTT, T/S, PCR, UA  ASA- Continue   Anesthesia- Yes- cardiac history  Pt denies having chest pain, sob, or fever at this time. All instructions explained to the pt, with a verbal understanding of the material. Pt agrees to go over the instructions while at home for a better understanding. The opportunity to ask questions was provided.

## 2018-05-04 NOTE — Anesthesia Preprocedure Evaluation (Addendum)
Anesthesia Evaluation  Patient identified by MRN, date of birth, ID band Patient awake    Reviewed: Allergy & Precautions, NPO status , Patient's Chart, lab work & pertinent test results  History of Anesthesia Complications Negative for: history of anesthetic complications  Airway Mallampati: III  TM Distance: >3 FB Neck ROM: Full    Dental  (+) Dental Advisory Given, Teeth Intact   Pulmonary sleep apnea , Current Smoker,    breath sounds clear to auscultation       Cardiovascular hypertension, Pt. on medications and Pt. on home beta blockers + CAD, + Past MI, + Cardiac Stents, + CABG and + Peripheral Vascular Disease  + Valvular Problems/Murmurs (s/p AVR)  Rhythm:Regular Rate:Normal  S/p AVR  '19 Cath (Pre-CABG) - Ost LM lesion is 95% stenosed. Prox Cx to Mid Cx lesion is 60% stenosed. Ost LAD lesion is 95% stenosed. Prox LAD to Mid LAD lesion is 75% stenosed. Mid RCA to Dist RCA lesion is 100% stenosed.  '19 TTE (Pre-CABG) - Moderate LVH. EF 60% to 65%. Grade 2 diastolic dysfunction. Severe AS, trivial AI. Mean gradient (S): 49 mm Hg. Valve area (VTI): 0.6 cm^2. Mild MR.    Neuro/Psych PSYCHIATRIC DISORDERS Anxiety negative neurological ROS     GI/Hepatic negative GI ROS, Neg liver ROS,   Endo/Other   Obesity Pre-DM   Renal/GU CRFRenal disease     Musculoskeletal  (+) Arthritis ,   Abdominal   Peds  Hematology negative hematology ROS (+)   Anesthesia Other Findings   Reproductive/Obstetrics                          Anesthesia Physical Anesthesia Plan  ASA: III  Anesthesia Plan: General   Post-op Pain Management:    Induction: Intravenous  PONV Risk Score and Plan: 3 and Treatment may vary due to age or medical condition, Ondansetron and Dexamethasone  Airway Management Planned: Oral ETT  Additional Equipment: Arterial line  Intra-op Plan:   Post-operative Plan:  Extubation in OR  Informed Consent: I have reviewed the patients History and Physical, chart, labs and discussed the procedure including the risks, benefits and alternatives for the proposed anesthesia with the patient or authorized representative who has indicated his/her understanding and acceptance.   Dental advisory given  Plan Discussed with: CRNA and Anesthesiologist  Anesthesia Plan Comments: ( )      Anesthesia Quick Evaluation
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
## 2018-05-12 ENCOUNTER — Inpatient Hospital Stay (HOSPITAL_COMMUNITY): Payer: No Typology Code available for payment source | Admitting: Physician Assistant

## 2018-05-12 ENCOUNTER — Other Ambulatory Visit: Payer: Self-pay

## 2018-05-12 ENCOUNTER — Inpatient Hospital Stay (HOSPITAL_COMMUNITY)
Admission: RE | Admit: 2018-05-12 | Discharge: 2018-05-13 | DRG: 039 | Disposition: A | Discharge status: Home / Self Care | Payer: No Typology Code available for payment source | Attending: Vascular Surgery | Admitting: Vascular Surgery

## 2018-05-12 ENCOUNTER — Encounter (HOSPITAL_COMMUNITY): Payer: Self-pay | Admitting: Surgery

## 2018-05-12 ENCOUNTER — Encounter (HOSPITAL_COMMUNITY)
Admission: RE | Disposition: A | Discharge status: Home / Self Care | Payer: Self-pay | Source: Home / Self Care | Attending: Vascular Surgery

## 2018-05-12 DIAGNOSIS — Z806 Family history of leukemia: Secondary | ICD-10-CM

## 2018-05-12 DIAGNOSIS — I350 Nonrheumatic aortic (valve) stenosis: Secondary | ICD-10-CM | POA: Diagnosis present

## 2018-05-12 DIAGNOSIS — I2510 Atherosclerotic heart disease of native coronary artery without angina pectoris: Secondary | ICD-10-CM | POA: Diagnosis present

## 2018-05-12 DIAGNOSIS — Z87891 Personal history of nicotine dependence: Secondary | ICD-10-CM | POA: Diagnosis not present

## 2018-05-12 DIAGNOSIS — F4310 Post-traumatic stress disorder, unspecified: Secondary | ICD-10-CM | POA: Diagnosis present

## 2018-05-12 DIAGNOSIS — R011 Cardiac murmur, unspecified: Secondary | ICD-10-CM | POA: Diagnosis present

## 2018-05-12 DIAGNOSIS — Z8249 Family history of ischemic heart disease and other diseases of the circulatory system: Secondary | ICD-10-CM

## 2018-05-12 DIAGNOSIS — I129 Hypertensive chronic kidney disease with stage 1 through stage 4 chronic kidney disease, or unspecified chronic kidney disease: Secondary | ICD-10-CM | POA: Diagnosis present

## 2018-05-12 DIAGNOSIS — N400 Enlarged prostate without lower urinary tract symptoms: Secondary | ICD-10-CM | POA: Diagnosis present

## 2018-05-12 DIAGNOSIS — G4730 Sleep apnea, unspecified: Secondary | ICD-10-CM | POA: Diagnosis present

## 2018-05-12 DIAGNOSIS — Z833 Family history of diabetes mellitus: Secondary | ICD-10-CM | POA: Diagnosis not present

## 2018-05-12 DIAGNOSIS — Z7982 Long term (current) use of aspirin: Secondary | ICD-10-CM

## 2018-05-12 DIAGNOSIS — I6529 Occlusion and stenosis of unspecified carotid artery: Secondary | ICD-10-CM | POA: Diagnosis present

## 2018-05-12 DIAGNOSIS — Z79899 Other long term (current) drug therapy: Secondary | ICD-10-CM

## 2018-05-12 DIAGNOSIS — I6521 Occlusion and stenosis of right carotid artery: Principal | ICD-10-CM | POA: Diagnosis present

## 2018-05-12 DIAGNOSIS — E785 Hyperlipidemia, unspecified: Secondary | ICD-10-CM | POA: Diagnosis present

## 2018-05-12 DIAGNOSIS — N183 Chronic kidney disease, stage 3 (moderate): Secondary | ICD-10-CM | POA: Diagnosis present

## 2018-05-12 DIAGNOSIS — G629 Polyneuropathy, unspecified: Secondary | ICD-10-CM | POA: Diagnosis present

## 2018-05-12 DIAGNOSIS — Z89412 Acquired absence of left great toe: Secondary | ICD-10-CM

## 2018-05-12 DIAGNOSIS — M1990 Unspecified osteoarthritis, unspecified site: Secondary | ICD-10-CM | POA: Diagnosis present

## 2018-05-12 DIAGNOSIS — I739 Peripheral vascular disease, unspecified: Secondary | ICD-10-CM | POA: Diagnosis present

## 2018-05-12 DIAGNOSIS — Z801 Family history of malignant neoplasm of trachea, bronchus and lung: Secondary | ICD-10-CM

## 2018-05-12 HISTORY — PX: ENDARTERECTOMY: SHX5162

## 2018-05-12 HISTORY — PX: CAROTID ENDARTERECTOMY: SUR193

## 2018-05-12 HISTORY — PX: PATCH ANGIOPLASTY: SHX6230

## 2018-05-12 LAB — POCT I-STAT, CHEM 8
BUN: 26 mg/dL — ABNORMAL HIGH (ref 8–23)
Calcium, Ion: 1.15 mmol/L (ref 1.15–1.40)
Chloride: 108 mmol/L (ref 98–111)
Creatinine, Ser: 2.3 mg/dL — ABNORMAL HIGH (ref 0.61–1.24)
Glucose, Bld: 114 mg/dL — ABNORMAL HIGH (ref 70–99)
HCT: 36 % — ABNORMAL LOW (ref 39.0–52.0)
Hemoglobin: 12.2 g/dL — ABNORMAL LOW (ref 13.0–17.0)
Potassium: 4.7 mmol/L (ref 3.5–5.1)
Sodium: 141 mmol/L (ref 135–145)
TCO2: 25 mmol/L (ref 22–32)

## 2018-05-12 SURGERY — ENDARTERECTOMY, CAROTID
Anesthesia: General | Site: Neck | Laterality: Right

## 2018-05-12 MED ORDER — GUAIFENESIN-DM 100-10 MG/5ML PO SYRP: 15.0000 mL | ORAL_SOLUTION | ORAL | Status: DC | PRN

## 2018-05-12 MED ORDER — PANTOPRAZOLE SODIUM 40 MG PO TBEC
40.0000 mg | DELAYED_RELEASE_TABLET | Freq: Every day | ORAL | Status: DC
  Administered 2018-05-13: 40 mg via ORAL
  Filled 2018-05-12: qty 1

## 2018-05-12 MED ORDER — FENTANYL CITRATE (PF) 250 MCG/5ML IJ SOLN
INTRAMUSCULAR | Status: AC
  Filled 2018-05-12: qty 5

## 2018-05-12 MED ORDER — ONDANSETRON HCL 4 MG/2ML IJ SOLN
INTRAMUSCULAR | Status: DC | PRN
  Administered 2018-05-12: 4 mg via INTRAVENOUS

## 2018-05-12 MED ORDER — FENTANYL CITRATE (PF) 100 MCG/2ML IJ SOLN: 25.0000 ug | INTRAMUSCULAR | Status: DC | PRN

## 2018-05-12 MED ORDER — PHENYLEPHRINE 40 MCG/ML (10ML) SYRINGE FOR IV PUSH (FOR BLOOD PRESSURE SUPPORT)
PREFILLED_SYRINGE | INTRAVENOUS | Status: DC | PRN
  Administered 2018-05-12: 120 ug via INTRAVENOUS

## 2018-05-12 MED ORDER — SODIUM CHLORIDE 0.9 % IV SOLN: 500.0000 mL | Freq: Once | INTRAVENOUS | Status: DC | PRN

## 2018-05-12 MED ORDER — POTASSIUM CITRATE ER 10 MEQ (1080 MG) PO TBCR
10.0000 meq | EXTENDED_RELEASE_TABLET | Freq: Two times a day (BID) | ORAL | Status: DC
  Administered 2018-05-12 – 2018-05-13 (×2): 10 meq via ORAL
  Filled 2018-05-12 (×3): qty 1

## 2018-05-12 MED ORDER — ONDANSETRON HCL 4 MG/2ML IJ SOLN: 4.0000 mg | Freq: Once | INTRAMUSCULAR | Status: DC | PRN

## 2018-05-12 MED ORDER — SODIUM CHLORIDE 0.9 % IV SOLN
INTRAVENOUS | Status: DC | PRN
  Administered 2018-05-12 (×2): via INTRAVENOUS

## 2018-05-12 MED ORDER — ACETAMINOPHEN 325 MG PO TABS
325.0000 mg | ORAL_TABLET | ORAL | Status: DC | PRN
  Administered 2018-05-13: 650 mg via ORAL
  Filled 2018-05-12: qty 2

## 2018-05-12 MED ORDER — ETOMIDATE 2 MG/ML IV SOLN
INTRAVENOUS | Status: AC
  Filled 2018-05-12: qty 10

## 2018-05-12 MED ORDER — DOCUSATE SODIUM 100 MG PO CAPS
100.0000 mg | ORAL_CAPSULE | Freq: Every day | ORAL | Status: DC
  Administered 2018-05-13: 100 mg via ORAL
  Filled 2018-05-12: qty 1

## 2018-05-12 MED ORDER — CLONIDINE HCL 0.1 MG PO TABS
0.1000 mg | ORAL_TABLET | Freq: Two times a day (BID) | ORAL | Status: DC
  Administered 2018-05-12 – 2018-05-13 (×2): 0.1 mg via ORAL
  Filled 2018-05-12 (×2): qty 1

## 2018-05-12 MED ORDER — HYDRALAZINE HCL 20 MG/ML IJ SOLN: 5.0000 mg | INTRAMUSCULAR | Status: DC | PRN

## 2018-05-12 MED ORDER — LIDOCAINE 2% (20 MG/ML) 5 ML SYRINGE
INTRAMUSCULAR | Status: AC
  Filled 2018-05-12: qty 5

## 2018-05-12 MED ORDER — PROTAMINE SULFATE 10 MG/ML IV SOLN
INTRAVENOUS | Status: DC | PRN
  Administered 2018-05-12: 10 mg via INTRAVENOUS
  Administered 2018-05-12 (×2): 20 mg via INTRAVENOUS

## 2018-05-12 MED ORDER — CHLORHEXIDINE GLUCONATE 4 % EX LIQD: 60.0000 mL | Freq: Once | CUTANEOUS | Status: DC

## 2018-05-12 MED ORDER — LABETALOL HCL 5 MG/ML IV SOLN: 10.0000 mg | INTRAVENOUS | Status: DC | PRN

## 2018-05-12 MED ORDER — LATANOPROST 0.005 % OP SOLN
1.0000 [drp] | Freq: Every day | OPHTHALMIC | Status: DC
  Administered 2018-05-12: 1 [drp] via OPHTHALMIC
  Filled 2018-05-12: qty 2.5

## 2018-05-12 MED ORDER — POTASSIUM CHLORIDE CRYS ER 20 MEQ PO TBCR: 20.0000 meq | EXTENDED_RELEASE_TABLET | Freq: Every day | ORAL | Status: DC | PRN

## 2018-05-12 MED ORDER — PHENYLEPHRINE 40 MCG/ML (10ML) SYRINGE FOR IV PUSH (FOR BLOOD PRESSURE SUPPORT)
PREFILLED_SYRINGE | INTRAVENOUS | Status: AC
  Filled 2018-05-12: qty 10

## 2018-05-12 MED ORDER — ATORVASTATIN CALCIUM 80 MG PO TABS
80.0000 mg | ORAL_TABLET | Freq: Every day | ORAL | Status: DC
  Administered 2018-05-12: 80 mg via ORAL
  Filled 2018-05-12: qty 1

## 2018-05-12 MED ORDER — DEXAMETHASONE SODIUM PHOSPHATE 10 MG/ML IJ SOLN
INTRAMUSCULAR | Status: DC | PRN
  Administered 2018-05-12: 10 mg via INTRAVENOUS

## 2018-05-12 MED ORDER — 0.9 % SODIUM CHLORIDE (POUR BTL) OPTIME
TOPICAL | Status: DC | PRN
  Administered 2018-05-12: 2000 mL

## 2018-05-12 MED ORDER — MAGNESIUM SULFATE 2 GM/50ML IV SOLN: 2.0000 g | Freq: Every day | INTRAVENOUS | Status: DC | PRN

## 2018-05-12 MED ORDER — SODIUM CHLORIDE 0.9 % IV SOLN
INTRAVENOUS | Status: AC
  Filled 2018-05-12: qty 1.2

## 2018-05-12 MED ORDER — ONDANSETRON HCL 4 MG/2ML IJ SOLN
INTRAMUSCULAR | Status: AC
  Filled 2018-05-12: qty 2

## 2018-05-12 MED ORDER — OXYCODONE HCL 5 MG PO TABS: 5.0000 mg | ORAL_TABLET | Freq: Once | ORAL | Status: DC | PRN

## 2018-05-12 MED ORDER — METOPROLOL TARTRATE 100 MG PO TABS
100.0000 mg | ORAL_TABLET | Freq: Two times a day (BID) | ORAL | Status: DC
  Administered 2018-05-12 – 2018-05-13 (×2): 100 mg via ORAL
  Filled 2018-05-12 (×2): qty 1

## 2018-05-12 MED ORDER — ROCURONIUM BROMIDE 10 MG/ML (PF) SYRINGE
PREFILLED_SYRINGE | INTRAVENOUS | Status: DC | PRN
  Administered 2018-05-12: 50 mg via INTRAVENOUS
  Administered 2018-05-12: 20 mg via INTRAVENOUS
  Administered 2018-05-12: 10 mg via INTRAVENOUS

## 2018-05-12 MED ORDER — ACETAMINOPHEN 325 MG RE SUPP: 325.0000 mg | RECTAL | Status: DC | PRN

## 2018-05-12 MED ORDER — FERROUS SULFATE 325 (65 FE) MG PO TABS
325.0000 mg | ORAL_TABLET | ORAL | Status: DC
  Administered 2018-05-12: 325 mg via ORAL
  Filled 2018-05-12: qty 1

## 2018-05-12 MED ORDER — SODIUM CHLORIDE 0.9 % IV SOLN
INTRAVENOUS | Status: DC | PRN
  Administered 2018-05-12: 500 mL

## 2018-05-12 MED ORDER — OXYCODONE HCL 5 MG PO TABS
5.0000 mg | ORAL_TABLET | Freq: Four times a day (QID) | ORAL | Status: DC | PRN
  Administered 2018-05-12 – 2018-05-13 (×3): 5 mg via ORAL
  Filled 2018-05-12 (×3): qty 1

## 2018-05-12 MED ORDER — SODIUM CHLORIDE 0.9 % IV SOLN
INTRAVENOUS | Status: DC | PRN
  Administered 2018-05-12: 50 ug/min via INTRAVENOUS

## 2018-05-12 MED ORDER — LABETALOL HCL 5 MG/ML IV SOLN
INTRAVENOUS | Status: DC | PRN
  Administered 2018-05-12: 2.5 mg via INTRAVENOUS

## 2018-05-12 MED ORDER — LIDOCAINE 2% (20 MG/ML) 5 ML SYRINGE
INTRAMUSCULAR | Status: DC | PRN
  Administered 2018-05-12: 80 mg via INTRAVENOUS

## 2018-05-12 MED ORDER — ASPIRIN EC 81 MG PO TBEC
81.0000 mg | DELAYED_RELEASE_TABLET | Freq: Every day | ORAL | Status: DC
  Administered 2018-05-12 – 2018-05-13 (×2): 81 mg via ORAL
  Filled 2018-05-12 (×2): qty 1

## 2018-05-12 MED ORDER — PHENOL 1.4 % MT LIQD: 1.0000 | OROMUCOSAL | Status: DC | PRN

## 2018-05-12 MED ORDER — CEFAZOLIN SODIUM-DEXTROSE 2-4 GM/100ML-% IV SOLN
2.0000 g | INTRAVENOUS | Status: AC
  Administered 2018-05-12: 2 g via INTRAVENOUS
  Filled 2018-05-12: qty 100

## 2018-05-12 MED ORDER — HEPARIN SODIUM (PORCINE) 1000 UNIT/ML IJ SOLN
INTRAMUSCULAR | Status: DC | PRN
  Administered 2018-05-12: 9000 [IU] via INTRAVENOUS

## 2018-05-12 MED ORDER — CEFAZOLIN SODIUM-DEXTROSE 2-4 GM/100ML-% IV SOLN
2.0000 g | Freq: Three times a day (TID) | INTRAVENOUS | Status: AC
  Administered 2018-05-12: 2 g via INTRAVENOUS
  Filled 2018-05-12: qty 100

## 2018-05-12 MED ORDER — PROPOFOL 10 MG/ML IV BOLUS
INTRAVENOUS | Status: AC
  Filled 2018-05-12: qty 20

## 2018-05-12 MED ORDER — SODIUM CHLORIDE 0.9 % IV SOLN: INTRAVENOUS | Status: DC

## 2018-05-12 MED ORDER — MORPHINE SULFATE (PF) 2 MG/ML IV SOLN: 2.0000 mg | INTRAVENOUS | Status: DC | PRN

## 2018-05-12 MED ORDER — OXYCODONE HCL 5 MG/5ML PO SOLN: 5.0000 mg | Freq: Once | ORAL | Status: DC | PRN

## 2018-05-12 MED ORDER — ALUM & MAG HYDROXIDE-SIMETH 200-200-20 MG/5ML PO SUSP: 15.0000 mL | ORAL | Status: DC | PRN

## 2018-05-12 MED ORDER — METOPROLOL TARTRATE 5 MG/5ML IV SOLN: 2.0000 mg | INTRAVENOUS | Status: DC | PRN

## 2018-05-12 MED ORDER — SODIUM CHLORIDE 0.9 % IV SOLN
INTRAVENOUS | Status: DC
  Administered 2018-05-12: 16:00:00 via INTRAVENOUS

## 2018-05-12 MED ORDER — AMLODIPINE BESYLATE 10 MG PO TABS
10.0000 mg | ORAL_TABLET | Freq: Every morning | ORAL | Status: DC
  Administered 2018-05-13: 10 mg via ORAL
  Filled 2018-05-12: qty 1

## 2018-05-12 MED ORDER — ONDANSETRON HCL 4 MG/2ML IJ SOLN: 4.0000 mg | Freq: Four times a day (QID) | INTRAMUSCULAR | Status: DC | PRN

## 2018-05-12 MED ORDER — ROCURONIUM BROMIDE 50 MG/5ML IV SOSY
PREFILLED_SYRINGE | INTRAVENOUS | Status: AC
  Filled 2018-05-12: qty 10

## 2018-05-12 MED ORDER — DEXAMETHASONE SODIUM PHOSPHATE 10 MG/ML IJ SOLN
INTRAMUSCULAR | Status: AC
  Filled 2018-05-12: qty 1

## 2018-05-12 MED ORDER — HEPARIN SODIUM (PORCINE) 1000 UNIT/ML IJ SOLN
INTRAMUSCULAR | Status: AC
  Filled 2018-05-12: qty 1

## 2018-05-12 MED ORDER — LIDOCAINE HCL (PF) 1 % IJ SOLN
INTRAMUSCULAR | Status: AC
  Filled 2018-05-12: qty 30

## 2018-05-12 MED ORDER — LACTATED RINGERS IV SOLN: INTRAVENOUS | Status: DC | PRN

## 2018-05-12 MED ORDER — NITROGLYCERIN 0.2 MG/ML ON CALL CATH LAB
INTRAVENOUS | Status: DC | PRN
  Administered 2018-05-12: 60 ug via INTRAVENOUS
  Administered 2018-05-12: 80 ug via INTRAVENOUS
  Administered 2018-05-12 (×2): 40 ug via INTRAVENOUS
  Administered 2018-05-12: 60 ug via INTRAVENOUS

## 2018-05-12 MED ORDER — SUGAMMADEX SODIUM 200 MG/2ML IV SOLN
INTRAVENOUS | Status: DC | PRN
  Administered 2018-05-12: 300 mg via INTRAVENOUS

## 2018-05-12 MED ORDER — PROPOFOL 10 MG/ML IV BOLUS
INTRAVENOUS | Status: DC | PRN
  Administered 2018-05-12: 80 mg via INTRAVENOUS
  Administered 2018-05-12: 120 mg via INTRAVENOUS

## 2018-05-12 MED ORDER — FENTANYL CITRATE (PF) 250 MCG/5ML IJ SOLN
INTRAMUSCULAR | Status: DC | PRN
  Administered 2018-05-12: 50 ug via INTRAVENOUS
  Administered 2018-05-12: 100 ug via INTRAVENOUS
  Administered 2018-05-12: 50 ug via INTRAVENOUS

## 2018-05-12 MED ORDER — GABAPENTIN 600 MG PO TABS
600.0000 mg | ORAL_TABLET | Freq: Three times a day (TID) | ORAL | Status: DC
  Administered 2018-05-12 – 2018-05-13 (×3): 600 mg via ORAL
  Filled 2018-05-12 (×3): qty 1

## 2018-05-12 SURGICAL SUPPLY — 47 items
CANISTER SUCT 3000ML PPV (MISCELLANEOUS) ×3 IMPLANT
CANNULA VESSEL 3MM 2 BLNT TIP (CANNULA) ×6 IMPLANT
CATH ROBINSON RED A/P 18FR (CATHETERS) ×3 IMPLANT
CLIP LIGATING EXTRA MED SLVR (CLIP) ×3 IMPLANT
CLIP LIGATING EXTRA SM BLUE (MISCELLANEOUS) ×3 IMPLANT
COVER WAND RF STERILE (DRAPES) ×3 IMPLANT
CRADLE DONUT ADULT HEAD (MISCELLANEOUS) ×3 IMPLANT
DECANTER SPIKE VIAL GLASS SM (MISCELLANEOUS) IMPLANT
DERMABOND ADVANCED (GAUZE/BANDAGES/DRESSINGS) ×2
DERMABOND ADVANCED .7 DNX12 (GAUZE/BANDAGES/DRESSINGS) ×1 IMPLANT
DRAIN HEMOVAC 1/8 X 5 (WOUND CARE) IMPLANT
ELECT REM PT RETURN 9FT ADLT (ELECTROSURGICAL) ×3
ELECTRODE REM PT RTRN 9FT ADLT (ELECTROSURGICAL) ×1 IMPLANT
EVACUATOR SILICONE 100CC (DRAIN) IMPLANT
GLOVE BIO SURGEON STRL SZ 6.5 (GLOVE) ×1 IMPLANT
GLOVE BIO SURGEON STRL SZ7.5 (GLOVE) ×2 IMPLANT
GLOVE BIO SURGEONS STRL SZ 6.5 (GLOVE) ×1
GLOVE BIOGEL PI IND STRL 6.5 (GLOVE) IMPLANT
GLOVE BIOGEL PI IND STRL 7.0 (GLOVE) IMPLANT
GLOVE BIOGEL PI INDICATOR 6.5 (GLOVE) ×2
GLOVE BIOGEL PI INDICATOR 7.0 (GLOVE) ×2
GLOVE SS BIOGEL STRL SZ 7.5 (GLOVE) ×1 IMPLANT
GLOVE SUPERSENSE BIOGEL SZ 7.5 (GLOVE) ×2
GOWN STRL REUS W/ TWL LRG LVL3 (GOWN DISPOSABLE) ×3 IMPLANT
GOWN STRL REUS W/ TWL XL LVL3 (GOWN DISPOSABLE) IMPLANT
GOWN STRL REUS W/TWL LRG LVL3 (GOWN DISPOSABLE) ×6
GOWN STRL REUS W/TWL XL LVL3 (GOWN DISPOSABLE) ×2
KIT BASIN OR (CUSTOM PROCEDURE TRAY) ×3 IMPLANT
KIT SHUNT ARGYLE CAROTID ART 6 (VASCULAR PRODUCTS) IMPLANT
KIT TURNOVER KIT B (KITS) ×3 IMPLANT
NEEDLE 22X1 1/2 (OR ONLY) (NEEDLE) IMPLANT
NS IRRIG 1000ML POUR BTL (IV SOLUTION) ×6 IMPLANT
PACK CAROTID (CUSTOM PROCEDURE TRAY) ×3 IMPLANT
PAD ARMBOARD 7.5X6 YLW CONV (MISCELLANEOUS) ×6 IMPLANT
PATCH HEMASHIELD 8X75 (Vascular Products) ×2 IMPLANT
SHUNT CAROTID BYPASS 10 (VASCULAR PRODUCTS) ×2 IMPLANT
SHUNT CAROTID BYPASS 12FRX15.5 (VASCULAR PRODUCTS) IMPLANT
SUT ETHILON 3 0 PS 1 (SUTURE) IMPLANT
SUT PROLENE 6 0 CC (SUTURE) ×5 IMPLANT
SUT SILK 3 0 (SUTURE)
SUT SILK 3-0 18XBRD TIE 12 (SUTURE) IMPLANT
SUT VIC AB 3-0 SH 27 (SUTURE) ×4
SUT VIC AB 3-0 SH 27X BRD (SUTURE) ×2 IMPLANT
SUT VICRYL 4-0 PS2 18IN ABS (SUTURE) ×3 IMPLANT
SYR CONTROL 10ML LL (SYRINGE) IMPLANT
TOWEL GREEN STERILE (TOWEL DISPOSABLE) ×3 IMPLANT
WATER STERILE IRR 1000ML POUR (IV SOLUTION) ×3 IMPLANT

## 2018-05-12 NOTE — Op Note (Signed)
    OPERATIVE REPORT  DATE OF SURGERY: 05/12/2018  [name], 73 y.o. male MRN: [id number]  DOB: 04/15/1946  PRE-OPERATIVE DIAGNOSIS: Severe asymptomatic right internal carotid artery stenosis  POST-OPERATIVE DIAGNOSIS:  Same  PROCEDURE: Right carotid endarterectomy and Dacron patch angioplasty  SURGEON:  Curt Jews, M.D.  PHYSICIAN ASSISTANT: Matt Eveland, PA-C  ANESTHESIA: General  EBL: per anesthesia record  Total I/O In: 1000 [I.V.:1000] Out: -   BLOOD ADMINISTERED: none  DRAINS: none  SPECIMEN: none  COUNTS CORRECT:  YES  PATIENT DISPOSITION:  PACU - hemodynamically stable  PROCEDURE DETAILS: Patient was taken to the operating placed supine position with area of the right neck prepped draped you sterile fashion.  An incision was made anterior sternocleidomastoid and carried down through the platysma electrocautery.  Sternocleidomastoid reflected posteriorly and the carotid sheath was opened.  The facial vein was ligated with 2-0 silk ties and divided.  The vagus and hypoglossal nerves were identified and preserved.  The common carotid artery was encircled with an umbilical tape and Rummel tourniquet.  Dissection was tented onto the bifurcation.  The superior thyroid artery was encircled with a 2-0 silk Potts tie.  The terminal carotid was encircled with a blue vessel loop and the internal carotid present are encircled with an umbilical tape and Rummel tourniquet.  The patient was given 9000 units of intravenous heparin.  After adequate circulation time the internal/external and common carotid arteries were occluded.  The common carotid artery was opened with an 11 blade and sent longitudinally with Potts scissors.  The 10 shunt was passed up the internal carotid and allowed to backbleed and then down the common carotid where secured with secured with Rummel tourniquet's.  The endarterectomy was going on the common carotid artery and the plaque was divided  proximally with Potts scissors.  The endarterectomy was continued onto the bifurcation.  The external carotid was endarterectomized with an eversion technique and the internal carotid was endarterectomized in an open fashion.  Remaining atheromatous debris was removed from the endarterectomy plane.  A Finesse Hemashield Dacron patch was brought onto the field and was sewn as a patch angioplasty with a running 6-0 Prolene suture.  Prior to completion of the closure the shunt was removed and the usual flushing maneuvers were undertaken.  Anastomosis then completed and flow was restored first to the external and then the internal carotid artery.  Excellent flow characteristics were noted with hand-held Doppler in the internal and external carotid artery.  The patient was given 50 mg of protamine to reverse heparin.  Wounds irrigated with saline.  Hemostasis talus cautery.  Wounds were closed with 0 Vicryl to reapproximate sternocleidomastoid over the carotid sheath.  Next the platysma was closed with a running 3-0 Vicryl suture and finally the skin was closed with a 4-0 subcuticular Vicryl stitch.  Sterile dressing was applied.  The patient was awakened neurologically intact in the operating room and was transferred to the recovery room in stable condition   [name], M.D., [facility] 05/12/2018 3:02 PM

## 2018-05-12 NOTE — Anesthesia Postprocedure Evaluation (Signed)
Anesthesia Post Note  [name]  Procedure(s) Performed: RIGHT  CAROTID ENDARTERECTOMY (Right Neck) PATCH ANGIOPLASTY (Right Neck)     Patient location during evaluation: PACU Anesthesia Type: General Level of consciousness: awake and alert Pain management: pain level controlled Vital Signs Assessment: post-procedure vital signs reviewed and stable Respiratory status: spontaneous breathing, nonlabored ventilation and respiratory function stable Cardiovascular status: blood pressure returned to baseline and stable Postop Assessment: no apparent nausea or vomiting Anesthetic complications: no    Last Vitals:  Vitals:   05/12/18 1300 05/12/18 1330  BP: (!) 154/71   Pulse: (!) 57 (!) 57  Resp: 13 14  Temp:    SpO2: 96% 97%    Last Pain:  Vitals:   05/12/18 1230  TempSrc:   PainSc: 0-No pain                 Audry Pili

## 2018-05-12 NOTE — Anesthesia Procedure Notes (Signed)
Procedure Name: Intubation Date/Time: 05/12/2018 10:18 AM Performed by: Teressa Lower., CRNA Pre-anesthesia Checklist: Patient identified, Emergency Drugs available, Suction available and Patient being monitored Patient Re-evaluated:Patient Re-evaluated prior to induction Oxygen Delivery Method: Circle system utilized Preoxygenation: Pre-oxygenation with 100% oxygen Induction Type: IV induction Ventilation: Mask ventilation without difficulty and Oral airway inserted - appropriate to patient size Laryngoscope Size: Mac and 4 Grade View: Grade I Tube type: Oral Tube size: 7.5 mm Number of attempts: 1 Airway Equipment and Method: Stylet and Oral airway Placement Confirmation: ETT inserted through vocal cords under direct vision,  positive ETCO2 and breath sounds checked- equal and bilateral Secured at: 23 cm Tube secured with: Tape Dental Injury: Teeth and Oropharynx as per pre-operative assessment

## 2018-05-12 NOTE — Progress Notes (Addendum)
Patient arrived from PACU and placed on monitor and CCMD made aware. Patient vital signs obtained, Pt with Rt neck incision with out swelling or drainage at this time. Per RN report, A line pressures elevated andMd with Anesthesia Aware Will monitor patient. Conni Knighton, Bettina Gavia RN

## 2018-05-12 NOTE — Anesthesia Procedure Notes (Signed)
Arterial Line Insertion Start/End1/12/2018 9:20 AM, 05/12/2018 9:25 AM Performed by: Teressa Lower., CRNA, CRNA  Patient location: Pre-op. Preanesthetic checklist: patient identified, IV checked, site marked, risks and benefits discussed, surgical consent, monitors and equipment checked, pre-op evaluation, timeout performed and anesthesia consent Lidocaine 1% used for infiltration Right, radial was placed Catheter size: 20 G Hand hygiene performed , maximum sterile barriers used  and Seldinger technique used Allen's test indicative of satisfactory collateral circulation Attempts: 1 Procedure performed without using ultrasound guided technique. Following insertion, dressing applied and Biopatch. Post procedure assessment: normal and unchanged  Patient tolerated the procedure well with no immediate complications.

## 2018-05-12 NOTE — Interval H&P Note (Signed)
History and Physical Interval Note:  05/12/2018 8:23 AM  Matthew [name]  has presented today for surgery, with the diagnosis of RIGHT CAROTID ARTERY STENOISIS  The various methods of treatment have been discussed with the patient and family. After consideration of risks, benefits and other options for treatment, the patient has consented to  Procedure(s): ENDARTERECTOMY CAROTID RIGHT (Right) as a surgical intervention .  The patient's history has been reviewed, patient examined, no change in status, stable for surgery.  I have reviewed the patient's chart and labs.  Questions were answered to the patient's satisfaction.     Curt Jews

## 2018-05-12 NOTE — Transfer of Care (Signed)
Immediate Anesthesia Transfer of Care Note  [name]  Procedure(s) Performed: RIGHT  CAROTID ENDARTERECTOMY (Right Neck) PATCH ANGIOPLASTY (Right Neck)  Patient Location: PACU  Anesthesia Type:General  Level of Consciousness: awake, alert  and oriented  Airway & Oxygen Therapy: Patient Spontanous Breathing and Patient connected to face mask oxygen  Post-op Assessment: Report given to RN and Post -op Vital signs reviewed and stable  Post vital signs: Reviewed and stable  Last Vitals:  Vitals Value Taken Time  BP 146/64 05/12/2018 12:00 PM  Temp    Pulse 61 05/12/2018 12:02 PM  Resp 10 05/12/2018 12:02 PM  SpO2 100 % 05/12/2018 12:02 PM  Vitals shown include unvalidated device data.  Last Pain:  Vitals:   05/12/18 0907  TempSrc: Oral  PainSc:       Patients Stated Pain Goal: 5 (72/[id number]/66 [id number])  Complications: No apparent anesthesia complications

## 2018-05-13 ENCOUNTER — Telehealth: Payer: Self-pay | Admitting: Vascular Surgery

## 2018-05-13 ENCOUNTER — Encounter (HOSPITAL_COMMUNITY): Payer: Self-pay | Admitting: Vascular Surgery

## 2018-05-13 LAB — BASIC METABOLIC PANEL
Anion gap: 8 (ref 5–15)
BUN: 25 mg/dL — AB (ref 8–23)
CO2: 21 mmol/L — ABNORMAL LOW (ref 22–32)
Calcium: 8.2 mg/dL — ABNORMAL LOW (ref 8.9–10.3)
Chloride: 110 mmol/L (ref 98–111)
Creatinine, Ser: 2.16 mg/dL — ABNORMAL HIGH (ref 0.61–1.24)
GFR calc Af Amer: 34 mL/min — ABNORMAL LOW (ref >60)
GFR calc non Af Amer: 29 mL/min — ABNORMAL LOW (ref >60)
Glucose, Bld: 147 mg/dL — ABNORMAL HIGH (ref 70–99)
POTASSIUM: 4.5 mmol/L (ref 3.5–5.1)
Sodium: 139 mmol/L (ref 135–145)

## 2018-05-13 LAB — CBC
HCT: 31.1 % — ABNORMAL LOW (ref 39.0–52.0)
HEMOGLOBIN: 10.3 g/dL — AB (ref 13.0–17.0)
MCH: 30.8 pg (ref 26.0–34.0)
MCHC: 33.1 g/dL (ref 30.0–36.0)
MCV: 93.1 fL (ref 80.0–100.0)
Platelets: 164 10*3/uL (ref 150–400)
RBC: 3.34 MIL/uL — ABNORMAL LOW (ref 4.22–5.81)
RDW: 12.2 % (ref 11.5–15.5)
WBC: 9.3 10*3/uL (ref 4.0–10.5)
nRBC: 0 % (ref 0.0–0.2)

## 2018-05-13 MED ORDER — OXYCODONE HCL 5 MG PO TABS: 5.0000 mg | ORAL_TABLET | Freq: Four times a day (QID) | ORAL | 0 refills | Status: DC | PRN

## 2018-05-13 NOTE — Telephone Encounter (Signed)
-----   Message from Dagoberto Ligas, PA-C sent at 05/13/2018  8:24 AM EST -----  Can you schedule an appt for this pt in 2 weeks with Dr. Donnetta Hutching.  PO R CEA. Thanks, [facility]

## 2018-05-13 NOTE — Discharge Summary (Signed)
Discharge Summary     Matthew [name] [birth year] 73 y.o. male  [id number]  Admission Date: 05/12/2018  Discharge Date: 05/13/17  Physician: [name], MD  Admission Diagnosis: RIGHT CAROTID ARTERY STENOISIS  Discharge Day services:    see progress note 05/13/17 Physical Exam: Vitals:   05/12/18 2300 05/13/18 0305  BP: (!) 149/72 (!) 153/76  Pulse:  (!) 59  Resp: 16 16  Temp:  98.4 F (36.9 C)  SpO2: 94% 97%    Hospital Course:  The patient was admitted to the hospital and taken to the operating room on 05/12/2018 and underwent right carotid endarterectomy.  The pt tolerated the procedure well and was transported to the PACU in fair condition.   By POD 1, the pt neuro status remained at baseline  The remainder of the hospital course consisted of increasing mobilization and increasing intake of solids without difficulty.  He will follow up in office in about 2-3 weeks.  He will be prescribed 1-2 days of narcotic pain medication for continued post operative pain control.  Discharge instructions were reviewed with the patient and he voices his understanding.  He will be discharged to home this morning in stable condition.   Recent Labs    05/12/18 0859 05/13/18 0327  NA 141 139  K 4.7 4.5  CL 108 110  CO2  --  21*  GLUCOSE 114* 147*  BUN 26* 25*  CALCIUM  --  8.2*   Recent Labs    05/12/18 0859 05/13/18 0327  WBC  --  9.3  HGB 12.2* 10.3*  HCT 36.0* 31.1*  PLT  --  164   No results for input(s): INR in the last 72 hours.     Discharge Diagnosis:  RIGHT CAROTID ARTERY STENOISIS  Secondary Diagnosis: Patient Active Problem List   Diagnosis Date Noted  . Carotid artery stenosis 05/12/2018  . 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 Chest pain in adult   . Non-ST elevation (NSTEMI) myocardial infarction ([place])   . PAD (peripheral artery disease) ([place])   . Severe aortic stenosis   . 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  . Great toe  amputation status, left 04/22/2016  . 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 Osteomyelitis of toe of left foot ([place]) 11/16/2015  . HTN (hypertension) 11/16/2015  . Diabetes mellitus, type 2 ([place]) 11/16/2015  . CKD (chronic kidney disease), stage III ([place]) 11/16/2015  . Hyperlipidemia 11/16/2015   Past Medical History:  Diagnosis Date  . Arthritis   . Bladder stones   . BPH (benign prostatic hyperplasia)   . CAD (coronary artery disease)    a. Pt reports prior stenting @ [facility] - no records in Epic.  Marland Kitchen Chronic kidney disease (CKD), stage III (moderate) (HCC)    dx from [place] in [place], [place]  . Heart murmur    a. AS  . Hyperlipidemia   . Hypertension   . Moderate to severe aortic stenosis    a. PER 01-15-16 [facility] .  Marland Kitchen Neuropathy    FEET AND HANDS  . Peripheral vascular disease ([place])    a. 04/2016 s/p L Fem->PT bypass; b. 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 04/2017 LLE Duplex: No restenosis. ABI R 0.63, ABI L 1.01.  Marland Kitchen Post traumatic stress disorder (PTSD)   . Pre-diabetes   . Sleep apnea    does not use cpap  Allergies as of 05/13/2018   No Known Allergies     Medication List    TAKE these medications   acetaminophen 325 MG tablet Commonly known as:  TYLENOL Take 2 tablets (650 mg total) by mouth every 6 (six) hours as needed for mild pain.   amLODipine 10 MG tablet Commonly known as:  NORVASC Take 10 mg by mouth every morning.   aspirin EC 81 MG tablet Take 81 mg by mouth daily.   atorvastatin 80 MG tablet Commonly known as:  LIPITOR Take 1 tablet (80 mg total) by mouth daily at 6 PM.   cloNIDine 0.1 MG tablet Commonly known as:  CATAPRES Take 1 tablet (0.1 mg total) by mouth 2 (two) times daily.   ferrous sulfate 325 (65 FE) MG tablet Take 325 mg by mouth every Monday, Wednesday, and Friday.   gabapentin 600 MG tablet Commonly known as:  NEURONTIN Take 600 mg by  mouth 3 (three) times daily.   latanoprost 0.005 % ophthalmic solution Commonly known as:  XALATAN Place 1 drop into both eyes at bedtime.   metoprolol tartrate 100 MG tablet Commonly known as:  LOPRESSOR Take 1 tablet (100 mg total) by mouth 2 (two) times daily.   Omega-3 Fish Oil 300 MG Caps Take 300 mg by mouth 2 (two) times daily.   oxybutynin 5 MG tablet Commonly known as:  DITROPAN Take 1 tablet (5 mg total) by mouth every 8 (eight) hours as needed for bladder spasms.   oxyCODONE 5 MG immediate release tablet Commonly known as:  Oxy IR/ROXICODONE Take 1 tablet (5 mg total) by mouth every 6 (six) hours as needed for severe pain.   potassium citrate 10 MEQ (1080 MG) SR tablet Commonly known as:  UROCIT-K Take 10 mEq by mouth 2 (two) times daily.        Discharge Instructions:   [facility] Discharge Instructions Carotid Endarterectomy (CEA)  Please refer to the following instructions for your post-procedure care. Your surgeon or physician assistant will discuss any changes with you.  Activity  You are encouraged to walk as much as you can. You can slowly return to normal activities but must avoid strenuous activity and heavy lifting until your doctor tell you it's OK. Avoid activities such as vacuuming or swinging a golf club. You can drive after one week if you are comfortable and you are no longer taking prescription pain medications. It is normal to feel tired for serval weeks after your surgery. It is also normal to have difficulty with sleep habits, eating, and bowel movements after surgery. These will go away with time.  Bathing/Showering  You may shower after you come home. Do not soak in a bathtub, hot tub, or swim until the incision heals completely.  Incision Care  Shower every day. Clean your incision with mild soap and water. Pat the area dry with a clean towel. You do not need a bandage unless otherwise instructed. Do not  apply any ointments or creams to your incision. You may have skin glue on your incision. Do not peel it off. It will come off on its own in about one week. Your incision may feel thickened and raised for several weeks after your surgery. This is normal and the skin will soften over time. For Men Only: It's OK to shave around the incision but do not shave the incision itself for 2 weeks. It is common to have numbness under your chin that could last for  several months.  Diet  Resume your normal diet. There are no special food restrictions following this procedure. A low fat/low cholesterol diet is recommended for all patients with vascular disease. In order to heal from your surgery, it is CRITICAL to get adequate nutrition. Your body requires vitamins, minerals, and protein. Vegetables are the best source of vitamins and minerals. Vegetables also provide the perfect balance of protein. Processed food has little nutritional value, so try to avoid this.  Medications  Resume taking all of your medications unless your doctor or physician assistant tells you not to.  If your incision is causing pain, you may take over-the- counter pain relievers such as acetaminophen (Tylenol). If you were prescribed a stronger pain medication, please be aware these medications can cause nausea and constipation.  Prevent nausea by taking the medication with a snack or meal. Avoid constipation by drinking plenty of fluids and eating foods with a high amount of fiber, such as fruits, vegetables, and grains. Do not take Tylenol if you are taking prescription pain medications.  Follow Up  Our office will schedule a follow up appointment 2-3 weeks following discharge.  Please call us immediately for any of the following conditions  Increased pain, redness, drainage (pus) from your incision site. Fever of 101 degrees or higher. If you should develop stroke (slurred speech, difficulty swallowing, weakness on one side of your  body, loss of vision) you should call 911 and go to the nearest emergency room.  Reduce your risk of vascular disease:  Stop smoking. If you would like help call [facility] at 1-800-QUIT-NOW 323-353-0932) or [place] at (585) [phone]. Manage your cholesterol Maintain a desired weight Control your diabetes Keep your blood pressure down  If you have any questions, please call the office at [phone].  Disposition: home  Patient's condition: is Good  Follow up: 1. Dr. Donnetta Hutching in 2 weeks.   Dagoberto Ligas, PA-C [facility] [phone]   --- For [facility] Registry use ---   Modified Rankin score at D/C (0-6): 0  IV medication needed for:  1. Hypertension: No 2. Hypotension: No  Post-op Complications: No  1. Post-op CVA or TIA: No  If yes: Event classification (right eye, left eye, right cortical, left cortical, verterobasilar, other):   If yes: Timing of event (intra-op, <6 hrs post-op, >=6 hrs post-op, unknown):   2. CN injury: No  If yes: CN  injuried   3. Myocardial infarction: No  If yes: Dx by (EKG or clinical, Troponin):   4.  CHF: No  5.  Dysrhythmia (new): No  6. Wound infection: No  7. Reperfusion symptoms: No  8. Return to OR: No  If yes: return to OR for (bleeding, neurologic, other CEA incision, other):   Discharge medications: Statin use:  Yes ASA use:  Yes   Beta blocker use:  Yes ACE-Inhibitor use:  No  ARB use:  No CCB use: Yes P2Y12 Antagonist use: No, [ ]  Plavix, [ ]  Plasugrel, [ ]  Ticlopinine, [ ]  Ticagrelor, [ ]  Other, [ ]  No for medical reason, [ ]  Non-compliant, [ ]  Not-indicated Anti-coagulant use:  No, [ ]  Warfarin, [ ]  Rivaroxaban, [ ]  Dabigatran,

## 2018-05-13 NOTE — Progress Notes (Signed)
Pt ambulated 470 feet with walker, pt tolerated well will continue

## 2018-05-13 NOTE — Progress Notes (Signed)
Discharge instructions (including medications) discussed with and copy provided to patient/caregiver 

## 2018-05-13 NOTE — Discharge Instructions (Signed)
   [facility] [place]  Discharge Instructions   Carotid Endarterectomy (CEA)  Please refer to the following instructions for your post-procedure care. Your surgeon or physician assistant will discuss any changes with you.  Activity  You are encouraged to walk as much as you can. You can slowly return to normal activities but must avoid strenuous activity and heavy lifting until your doctor tell you it's OK. Avoid activities such as vacuuming or swinging a golf club. You can drive after one week if you are comfortable and you are no longer taking prescription pain medications. It is normal to feel tired for serval weeks after your surgery. It is also normal to have difficulty with sleep habits, eating, and bowel movements after surgery. These will go away with time.  Bathing/Showering  You may shower after you come home. Do not soak in a bathtub, hot tub, or swim until the incision heals completely.  Incision Care  Shower every day. Clean your incision with mild soap and water. Pat the area dry with a clean towel. You do not need a bandage unless otherwise instructed. Do not apply any ointments or creams to your incision. You may have skin glue on your incision. Do not peel it off. It will come off on its own in about one week. Your incision may feel thickened and raised for several weeks after your surgery. This is normal and the skin will soften over time. For Men Only: It's OK to shave around the incision but do not shave the incision itself for 2 weeks. It is common to have numbness under your chin that could last for several months.  Diet  Resume your normal diet. There are no special food restrictions following this procedure. A low fat/low cholesterol diet is recommended for all patients with vascular disease. In order to heal from your surgery, it is CRITICAL to get adequate nutrition. Your body requires vitamins, minerals, and protein. Vegetables are the best  source of vitamins and minerals. Vegetables also provide the perfect balance of protein. Processed food has little nutritional value, so try to avoid this.        Medications  Resume taking all of your medications unless your doctor or physician assistant tells you not to. If your incision is causing pain, you may take over-the- counter pain relievers such as acetaminophen (Tylenol). If you were prescribed a stronger pain medication, please be aware these medications can cause nausea and constipation. Prevent nausea by taking the medication with a snack or meal. Avoid constipation by drinking plenty of fluids and eating foods with a high amount of fiber, such as fruits, vegetables, and grains. Do not take Tylenol if you are taking prescription pain medications.  Follow Up  Our office will schedule a follow up appointment 2-3 weeks following discharge.  Please call us immediately for any of the following conditions  Increased pain, redness, drainage (pus) from your incision site. Fever of 101 degrees or higher. If you should develop stroke (slurred speech, difficulty swallowing, weakness on one side of your body, loss of vision) you should call 911 and go to the nearest emergency room.  Reduce your risk of vascular disease:  Stop smoking. If you would like help call [facility] at 1-800-QUIT-NOW (1-800-784-8669) or [place] at [phone]. Manage your cholesterol Maintain a desired weight Control your diabetes Keep your blood pressure down  If you have any questions, please call the office at [phone].   

## 2018-05-13 NOTE — Progress Notes (Addendum)
  Progress Note    05/13/2018 7:21 AM 1 Day Post-Op  Subjective:  Denies stroke like symptoms including slurring speech, changes in vision, or one sided weakness.   Vitals:   05/12/18 2300 05/13/18 0305  BP: (!) 149/72 (!) 153/76  Pulse:  (!) 59  Resp: 16 16  Temp:  98.4 F (36.9 C)  SpO2: 94% 97%   Physical Exam: Lungs:  Non labored Incisions:  R neck incision c/d/i; trachea midline Extremities:  Symmetrical grip strength Abdomen:  Soft Neurologic: A&O; CN grossly intact  CBC    Component Value Date/Time   WBC 9.3 05/13/2018 0327   RBC 3.34 (L) 05/13/2018 0327   HGB 10.3 (L) 05/13/2018 0327   HCT 31.1 (L) 05/13/2018 0327   PLT 164 05/13/2018 0327   MCV 93.1 05/13/2018 0327   MCH 30.8 05/13/2018 0327   MCHC 33.1 05/13/2018 0327   RDW 12.2 05/13/2018 0327   LYMPHSABS 2.1 11/16/2015 1135   MONOABS 0.7 11/16/2015 1135   EOSABS 0.3 11/16/2015 1135   BASOSABS 0.1 11/16/2015 1135    BMET    Component Value Date/Time   NA 139 05/13/2018 0327   K 4.5 05/13/2018 0327   CL 110 05/13/2018 0327   CO2 21 (L) 05/13/2018 0327   GLUCOSE 147 (H) 05/13/2018 0327   BUN 25 (H) 05/13/2018 0327   CREATININE 2.16 (H) 05/13/2018 0327   CALCIUM 8.2 (L) 05/13/2018 0327   GFRNONAA 29 (L) 05/13/2018 0327   GFRAA 34 (L) 05/13/2018 0327    INR    Component Value Date/Time   INR 0.99 05/03/2018 1311     Intake/Output Summary (Last 24 hours) at 05/13/2018 1610 Last data filed at 05/13/2018 0600 Gross per 24 hour  Intake 2568.29 ml  Output 700 ml  Net 1868.29 ml     Assessment/Plan:  73 y.o. male is s/p R CEA 1 Day Post-Op   Neuro exam remains at baseline Neck incision unremarkable Discharge home today after breakfast and ambulating Follow up in Dr. Donnetta Hutching in about 2 weeks  Dagoberto Ligas, PA-C [facility] [phone] 05/13/2018 7:21 AM   I have seen and evaluated the patient. I agree with the PA note as documented above. POD #1 s/p R CEA with Dr.  Donnetta Hutching.  No acute events overnight.  Neck c/d/i.  Neuro exam stable - some weakness in left hand grip noted prior to surgery.  Plan for d/c home today.  Will arrange follow-up in 3 weeks for wound check.  Marty Heck, MD [facility] [place] Office: [phone] Pager: [phone]  Marty Heck 05/13/2018 8:22 AM --

## 2018-05-13 NOTE — Telephone Encounter (Signed)
sch appt spk to pt wife 05/25/2018 2pm f/u MD

## 2018-05-25 ENCOUNTER — Ambulatory Visit (INDEPENDENT_AMBULATORY_CARE_PROVIDER_SITE_OTHER): Payer: Self-pay | Admitting: Vascular Surgery

## 2018-05-25 ENCOUNTER — Other Ambulatory Visit: Payer: Self-pay

## 2018-05-25 ENCOUNTER — Encounter: Payer: Self-pay | Admitting: Vascular Surgery

## 2018-05-25 VITALS — BP 150/84 | HR 87 | Temp 98.4°F | Resp 20 | Ht 68.0 in | Wt 203.0 lb

## 2018-05-25 DIAGNOSIS — I6521 Occlusion and stenosis of right carotid artery: Secondary | ICD-10-CM
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
## 2018-08-21 DIAGNOSIS — H6121 Impacted cerumen, right ear: Secondary | ICD-10-CM | POA: Diagnosis not present

## 2018-10-04 DIAGNOSIS — I249 Acute ischemic heart disease, unspecified: Secondary | ICD-10-CM

## 2018-10-04 HISTORY — DX: Acute ischemic heart disease, unspecified: I24.9

## 2018-10-06 ENCOUNTER — Observation Stay (HOSPITAL_COMMUNITY)
Admission: AD | Admit: 2018-10-06 | Discharge: 2018-10-07 | Disposition: A | Discharge status: Home / Self Care | Payer: Medicare Other | Source: Other Acute Inpatient Hospital | Attending: Internal Medicine | Admitting: Internal Medicine

## 2018-10-06 ENCOUNTER — Encounter (HOSPITAL_COMMUNITY): Payer: Self-pay | Admitting: Internal Medicine

## 2018-10-06 DIAGNOSIS — M1990 Unspecified osteoarthritis, unspecified site: Secondary | ICD-10-CM | POA: Diagnosis not present

## 2018-10-06 DIAGNOSIS — G4733 Obstructive sleep apnea (adult) (pediatric): Secondary | ICD-10-CM | POA: Insufficient documentation

## 2018-10-06 DIAGNOSIS — N400 Enlarged prostate without lower urinary tract symptoms: Secondary | ICD-10-CM | POA: Insufficient documentation

## 2018-10-06 DIAGNOSIS — Z952 Presence of prosthetic heart valve: Secondary | ICD-10-CM | POA: Diagnosis not present

## 2018-10-06 DIAGNOSIS — F4310 Post-traumatic stress disorder, unspecified: Secondary | ICD-10-CM | POA: Insufficient documentation

## 2018-10-06 DIAGNOSIS — Z7982 Long term (current) use of aspirin: Secondary | ICD-10-CM | POA: Insufficient documentation

## 2018-10-06 DIAGNOSIS — Z951 Presence of aortocoronary bypass graft: Secondary | ICD-10-CM | POA: Diagnosis not present

## 2018-10-06 DIAGNOSIS — I252 Old myocardial infarction: Secondary | ICD-10-CM | POA: Insufficient documentation

## 2018-10-06 DIAGNOSIS — Z79899 Other long term (current) drug therapy: Secondary | ICD-10-CM | POA: Diagnosis not present

## 2018-10-06 DIAGNOSIS — Z8249 Family history of ischemic heart disease and other diseases of the circulatory system: Secondary | ICD-10-CM | POA: Diagnosis not present

## 2018-10-06 DIAGNOSIS — I249 Acute ischemic heart disease, unspecified: Principal | ICD-10-CM | POA: Insufficient documentation

## 2018-10-06 DIAGNOSIS — R7303 Prediabetes: Secondary | ICD-10-CM | POA: Diagnosis not present

## 2018-10-06 DIAGNOSIS — I739 Peripheral vascular disease, unspecified: Secondary | ICD-10-CM | POA: Diagnosis not present

## 2018-10-06 DIAGNOSIS — R079 Chest pain, unspecified: Secondary | ICD-10-CM | POA: Diagnosis present

## 2018-10-06 DIAGNOSIS — I2510 Atherosclerotic heart disease of native coronary artery without angina pectoris: Secondary | ICD-10-CM | POA: Insufficient documentation

## 2018-10-06 DIAGNOSIS — G629 Polyneuropathy, unspecified: Secondary | ICD-10-CM | POA: Insufficient documentation

## 2018-10-06 DIAGNOSIS — N184 Chronic kidney disease, stage 4 (severe): Secondary | ICD-10-CM | POA: Insufficient documentation

## 2018-10-06 DIAGNOSIS — I493 Ventricular premature depolarization: Secondary | ICD-10-CM | POA: Diagnosis not present

## 2018-10-06 DIAGNOSIS — I6529 Occlusion and stenosis of unspecified carotid artery: Secondary | ICD-10-CM | POA: Diagnosis not present

## 2018-10-06 DIAGNOSIS — M7989 Other specified soft tissue disorders: Secondary | ICD-10-CM | POA: Diagnosis not present

## 2018-10-06 DIAGNOSIS — I129 Hypertensive chronic kidney disease with stage 1 through stage 4 chronic kidney disease, or unspecified chronic kidney disease: Secondary | ICD-10-CM | POA: Insufficient documentation

## 2018-10-06 DIAGNOSIS — Z1159 Encounter for screening for other viral diseases: Secondary | ICD-10-CM | POA: Insufficient documentation

## 2018-10-06 DIAGNOSIS — E785 Hyperlipidemia, unspecified: Secondary | ICD-10-CM | POA: Diagnosis not present

## 2018-10-06 DIAGNOSIS — I080 Rheumatic disorders of both mitral and aortic valves: Secondary | ICD-10-CM | POA: Insufficient documentation

## 2018-10-06 DIAGNOSIS — F17210 Nicotine dependence, cigarettes, uncomplicated: Secondary | ICD-10-CM | POA: Insufficient documentation

## 2018-10-06 DIAGNOSIS — I10 Essential (primary) hypertension: Secondary | ICD-10-CM | POA: Diagnosis present

## 2018-10-06 LAB — BASIC METABOLIC PANEL
Anion gap: 14 (ref 5–15)
BUN: 20 mg/dL (ref 8–23)
CO2: 21 mmol/L — ABNORMAL LOW (ref 22–32)
Calcium: 9.2 mg/dL (ref 8.9–10.3)
Chloride: 103 mmol/L (ref 98–111)
Creatinine, Ser: 2.28 mg/dL — ABNORMAL HIGH (ref 0.61–1.24)
GFR calc Af Amer: 32 mL/min — ABNORMAL LOW (ref >60)
GFR calc non Af Amer: 27 mL/min — ABNORMAL LOW (ref >60)
Glucose, Bld: 188 mg/dL — ABNORMAL HIGH (ref 70–99)
Potassium: 4.4 mmol/L (ref 3.5–5.1)
Sodium: 138 mmol/L (ref 135–145)

## 2018-10-06 LAB — SARS CORONAVIRUS 2 BY RT PCR (HOSPITAL ORDER, PERFORMED IN ~~LOC~~ HOSPITAL LAB): SARS Coronavirus 2: NEGATIVE

## 2018-10-06 LAB — TROPONIN I: Troponin I: <0.03 ng/mL (ref <0.03)

## 2018-10-06 MED ORDER — GABAPENTIN 300 MG PO CAPS
600.0000 mg | ORAL_CAPSULE | Freq: Once | ORAL | Status: AC
  Administered 2018-10-06: 600 mg via ORAL
  Filled 2018-10-06: qty 2

## 2018-10-06 MED ORDER — OXYCODONE HCL 5 MG PO TABS
5.0000 mg | ORAL_TABLET | Freq: Four times a day (QID) | ORAL | Status: DC | PRN
  Administered 2018-10-06 – 2018-10-07 (×3): 5 mg via ORAL
  Filled 2018-10-06 (×3): qty 1

## 2018-10-06 MED ORDER — SODIUM CHLORIDE 0.9% FLUSH: 3.0000 mL | INTRAVENOUS | Status: DC | PRN

## 2018-10-06 MED ORDER — ACETAMINOPHEN 325 MG PO TABS
650.0000 mg | ORAL_TABLET | ORAL | Status: DC | PRN
  Filled 2018-10-06: qty 2

## 2018-10-06 MED ORDER — ASPIRIN EC 81 MG PO TBEC
81.0000 mg | DELAYED_RELEASE_TABLET | Freq: Every day | ORAL | Status: DC
  Administered 2018-10-07: 81 mg via ORAL
  Filled 2018-10-06: qty 1

## 2018-10-06 MED ORDER — METOPROLOL TARTRATE 100 MG PO TABS
100.0000 mg | ORAL_TABLET | Freq: Two times a day (BID) | ORAL | Status: DC
  Administered 2018-10-06 – 2018-10-07 (×2): 100 mg via ORAL
  Filled 2018-10-06 (×2): qty 1

## 2018-10-06 MED ORDER — ONDANSETRON HCL 4 MG/2ML IJ SOLN: 4.0000 mg | Freq: Four times a day (QID) | INTRAMUSCULAR | Status: DC | PRN

## 2018-10-06 MED ORDER — HEPARIN (PORCINE) 25000 UT/250ML-% IV SOLN
1250.0000 [IU]/h | INTRAVENOUS | Status: DC
  Administered 2018-10-07: 1000 [IU]/h via INTRAVENOUS
  Filled 2018-10-06: qty 250

## 2018-10-06 MED ORDER — CLONIDINE HCL 0.1 MG PO TABS
0.1000 mg | ORAL_TABLET | Freq: Two times a day (BID) | ORAL | Status: DC
  Administered 2018-10-06 – 2018-10-07 (×2): 0.1 mg via ORAL
  Filled 2018-10-06 (×2): qty 1

## 2018-10-06 MED ORDER — NITROGLYCERIN 0.4 MG SL SUBL: 0.4000 mg | SUBLINGUAL_TABLET | SUBLINGUAL | Status: DC | PRN

## 2018-10-06 MED ORDER — SODIUM CHLORIDE 0.9 % IV SOLN: 250.0000 mL | INTRAVENOUS | Status: DC | PRN

## 2018-10-06 MED ORDER — ASPIRIN 81 MG PO CHEW
324.0000 mg | CHEWABLE_TABLET | ORAL | Status: DC
  Filled 2018-10-06: qty 4

## 2018-10-06 MED ORDER — AMLODIPINE BESYLATE 10 MG PO TABS
10.0000 mg | ORAL_TABLET | Freq: Every day | ORAL | Status: DC
  Administered 2018-10-06 – 2018-10-07 (×2): 10 mg via ORAL
  Filled 2018-10-06 (×2): qty 1

## 2018-10-06 MED ORDER — SODIUM CHLORIDE 0.9% FLUSH
3.0000 mL | Freq: Two times a day (BID) | INTRAVENOUS | Status: DC
  Administered 2018-10-06 – 2018-10-07 (×2): 3 mL via INTRAVENOUS

## 2018-10-06 MED ORDER — ASPIRIN 300 MG RE SUPP
300.0000 mg | RECTAL | Status: DC
  Filled 2018-10-06: qty 1
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
## 2018-10-06 NOTE — Progress Notes (Signed)
Patient arrived to unit.  No chest, but BP 193/93.   Paged [facility] admit.  Dr. Evangeline Gula will be up to assess patient.    MD arrived on unit-  Made aware of BP.  Will look over medications and order them.

## 2018-10-06 NOTE — Progress Notes (Signed)
ANTICOAGULATION CONSULT NOTE - Initial Consult  Pharmacy Consult for heparin Indication: chest pain/ACS  No Known Allergies  Patient Measurements: Height: 5\' 8"  (172.7 cm) Weight: 206 lb 1.6 oz (93.5 kg) IBW/kg (Calculated) : 68.4 Heparin Dosing Weight: 88kg  Vital Signs: Temp: 98.4 F (36.9 C) (06/03 1707) Temp Source: Oral (06/03 1707) BP: 191/94 (06/03 1710) Pulse Rate: 92 (06/03 1710)  Labs: No results for input(s): HGB, HCT, PLT, APTT, LABPROT, INR, HEPARINUNFRC, HEPRLOWMOCWT, CREATININE, CKTOTAL, CKMB, TROPONINI in the last 72 hours.  CrCl cannot be calculated (Patient's most recent lab result is older than the maximum 21 days allowed.).   Medical History: Past Medical History:  Diagnosis Date  . Arthritis   . Bladder stones   . BPH (benign prostatic hyperplasia)   . CAD (coronary artery disease)    a. Pt reports prior stenting @ [facility] - no records in Epic.  Marland Kitchen Chronic kidney disease (CKD), stage III (moderate) (HCC)    dx from [place] in [place], [place]  . Heart murmur    a. AS  . Hyperlipidemia   . Hypertension   . Moderate to severe aortic stenosis    a. [facility] [place].  Marland Kitchen Neuropathy    FEET AND HANDS  . Peripheral vascular disease ([place])    a. 04/2016 s/p L Fem->PT bypass; b. 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 04/2017 LLE Duplex: No restenosis. ABI R 0.63, ABI L 1.01.  Mar[place] Kitchen Post traumatic stress disorder (PTSD)   . Pre-diabetes   . Sleep apnea    does not use cpap    Assessment: 73 year old male transferred from [facility] for cardiology evaluation.   Low troponin at outside hospital, was very low concern for DVT. Patient received lovenox at outside hospital. Although shadow chart from transfer did not have this afternoon's medications, patient states he did receive an injection in his stomach shortly prior to his transfer which would correlate with his 0300 dosing early this morning. Will start IV heparin in am without bolus.   CBC at  OSH was within normal limits, he does have CKD with a scr ~2.3.   Goal of Therapy:  Heparin level 0.3-0.7 units/ml Monitor platelets by anticoagulation protocol: Yes   Plan:  Start heparin infusion at 1000 units/hr>>0600 6/4 Check anti-Xa level in 8 hours and daily while on heparin Continue to monitor H&H and platelets  Erin Hearing PharmD., BCPS Clinical Pharmacist 10/06/2018 6:53 PM

## 2018-10-07 ENCOUNTER — Other Ambulatory Visit: Payer: Self-pay

## 2018-10-07 ENCOUNTER — Encounter (HOSPITAL_COMMUNITY): Payer: Self-pay | Admitting: General Practice

## 2018-10-07 ENCOUNTER — Observation Stay (HOSPITAL_BASED_OUTPATIENT_CLINIC_OR_DEPARTMENT_OTHER): Payer: Medicare Other

## 2018-10-07 DIAGNOSIS — R079 Chest pain, unspecified: Secondary | ICD-10-CM | POA: Diagnosis not present

## 2018-10-07 DIAGNOSIS — I249 Acute ischemic heart disease, unspecified: Secondary | ICD-10-CM | POA: Diagnosis not present

## 2018-10-07 DIAGNOSIS — Z952 Presence of prosthetic heart valve: Secondary | ICD-10-CM | POA: Diagnosis not present

## 2018-10-07 DIAGNOSIS — Z7982 Long term (current) use of aspirin: Secondary | ICD-10-CM | POA: Diagnosis not present

## 2018-10-07 DIAGNOSIS — I129 Hypertensive chronic kidney disease with stage 1 through stage 4 chronic kidney disease, or unspecified chronic kidney disease: Secondary | ICD-10-CM | POA: Diagnosis not present

## 2018-10-07 DIAGNOSIS — E785 Hyperlipidemia, unspecified: Secondary | ICD-10-CM | POA: Diagnosis not present

## 2018-10-07 DIAGNOSIS — I6529 Occlusion and stenosis of unspecified carotid artery: Secondary | ICD-10-CM | POA: Diagnosis not present

## 2018-10-07 DIAGNOSIS — Z1159 Encounter for screening for other viral diseases: Secondary | ICD-10-CM | POA: Diagnosis not present

## 2018-10-07 DIAGNOSIS — R0789 Other chest pain: Secondary | ICD-10-CM | POA: Diagnosis not present

## 2018-10-07 DIAGNOSIS — M7989 Other specified soft tissue disorders: Secondary | ICD-10-CM | POA: Diagnosis not present

## 2018-10-07 DIAGNOSIS — M1990 Unspecified osteoarthritis, unspecified site: Secondary | ICD-10-CM | POA: Diagnosis not present

## 2018-10-07 DIAGNOSIS — Z8249 Family history of ischemic heart disease and other diseases of the circulatory system: Secondary | ICD-10-CM | POA: Diagnosis not present

## 2018-10-07 DIAGNOSIS — I361 Nonrheumatic tricuspid (valve) insufficiency: Secondary | ICD-10-CM

## 2018-10-07 DIAGNOSIS — G4733 Obstructive sleep apnea (adult) (pediatric): Secondary | ICD-10-CM | POA: Diagnosis not present

## 2018-10-07 DIAGNOSIS — I340 Nonrheumatic mitral (valve) insufficiency: Secondary | ICD-10-CM | POA: Diagnosis not present

## 2018-10-07 DIAGNOSIS — I2510 Atherosclerotic heart disease of native coronary artery without angina pectoris: Secondary | ICD-10-CM | POA: Diagnosis not present

## 2018-10-07 DIAGNOSIS — N184 Chronic kidney disease, stage 4 (severe): Secondary | ICD-10-CM | POA: Diagnosis not present

## 2018-10-07 DIAGNOSIS — R7303 Prediabetes: Secondary | ICD-10-CM | POA: Diagnosis not present

## 2018-10-07 DIAGNOSIS — I080 Rheumatic disorders of both mitral and aortic valves: Secondary | ICD-10-CM | POA: Diagnosis not present

## 2018-10-07 DIAGNOSIS — I252 Old myocardial infarction: Secondary | ICD-10-CM | POA: Diagnosis not present

## 2018-10-07 DIAGNOSIS — G629 Polyneuropathy, unspecified: Secondary | ICD-10-CM | POA: Diagnosis not present

## 2018-10-07 DIAGNOSIS — I493 Ventricular premature depolarization: Secondary | ICD-10-CM | POA: Diagnosis not present

## 2018-10-07 DIAGNOSIS — R609 Edema, unspecified: Secondary | ICD-10-CM | POA: Diagnosis not present

## 2018-10-07 DIAGNOSIS — Z79899 Other long term (current) drug therapy: Secondary | ICD-10-CM | POA: Diagnosis not present

## 2018-10-07 DIAGNOSIS — I739 Peripheral vascular disease, unspecified: Secondary | ICD-10-CM | POA: Diagnosis not present

## 2018-10-07 DIAGNOSIS — Z951 Presence of aortocoronary bypass graft: Secondary | ICD-10-CM | POA: Diagnosis not present

## 2018-10-07 LAB — BASIC METABOLIC PANEL
Anion gap: 12 (ref 5–15)
BUN: 20 mg/dL (ref 8–23)
CO2: 22 mmol/L (ref 22–32)
Calcium: 8.9 mg/dL (ref 8.9–10.3)
Chloride: 102 mmol/L (ref 98–111)
Creatinine, Ser: 2.17 mg/dL — ABNORMAL HIGH (ref 0.61–1.24)
GFR calc Af Amer: 34 mL/min — ABNORMAL LOW (ref >60)
GFR calc non Af Amer: 29 mL/min — ABNORMAL LOW (ref >60)
Glucose, Bld: 117 mg/dL — ABNORMAL HIGH (ref 70–99)
Potassium: 4.4 mmol/L (ref 3.5–5.1)
Sodium: 136 mmol/L (ref 135–145)

## 2018-10-07 LAB — CBC
HCT: 36.1 % — ABNORMAL LOW (ref 39.0–52.0)
Hemoglobin: 12.3 g/dL — ABNORMAL LOW (ref 13.0–17.0)
MCH: 30.8 pg (ref 26.0–34.0)
MCHC: 34.1 g/dL (ref 30.0–36.0)
MCV: 90.5 fL (ref 80.0–100.0)
Platelets: 232 10*3/uL (ref 150–400)
RBC: 3.99 MIL/uL — ABNORMAL LOW (ref 4.22–5.81)
RDW: 11.9 % (ref 11.5–15.5)
WBC: 6 10*3/uL (ref 4.0–10.5)
nRBC: 0 % (ref 0.0–0.2)

## 2018-10-07 LAB — ECHOCARDIOGRAM COMPLETE
Height: 68 in
Weight: 3209.6 oz

## 2018-10-07 LAB — LIPID PANEL
Cholesterol: 153 mg/dL (ref 0–200)
HDL: 28 mg/dL — ABNORMAL LOW (ref >40)
LDL Cholesterol: 80 mg/dL (ref 0–99)
Total CHOL/HDL Ratio: 5.5 RATIO
Triglycerides: 224 mg/dL — ABNORMAL HIGH (ref <150)
VLDL: 45 mg/dL — ABNORMAL HIGH (ref 0–40)

## 2018-10-07 LAB — TROPONIN I
Troponin I: <0.03 ng/mL (ref <0.03)
Troponin I: <0.03 ng/mL (ref <0.03)

## 2018-10-07 LAB — HEPARIN LEVEL (UNFRACTIONATED): Heparin Unfractionated: 0.14 IU/mL — ABNORMAL LOW (ref 0.30–0.70)

## 2018-10-07 MED ORDER — HEPARIN BOLUS VIA INFUSION: 2000.0000 [IU] | Freq: Once | INTRAVENOUS | Status: DC

## 2018-10-07 MED ORDER — NITROGLYCERIN 0.4 MG SL SUBL: 0.4000 mg | SUBLINGUAL_TABLET | SUBLINGUAL | 0 refills | Status: DC | PRN

## 2018-10-07 MED ORDER — HEPARIN (PORCINE) 25000 UT/250ML-% IV SOLN: 1250.0000 [IU]/h | INTRAVENOUS | Status: DC
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
## 2018-10-07 NOTE — Progress Notes (Signed)
Upper extremity venous has been completed.   Preliminary results in CV Proc.   Abram Sander 10/07/2018 9:02 AM

## 2018-10-07 NOTE — Plan of Care (Signed)

## 2018-10-07 NOTE — Progress Notes (Signed)
ANTICOAGULATION CONSULT NOTE - follow up Pharmacy Consult for heparin Indication: chest pain/ACS  No Known Allergies  Patient Measurements: Height: 5\' 8"  (172.7 cm) Weight: 200 lb 9.6 oz (91 kg) IBW/kg (Calculated) : 68.4 Heparin Dosing Weight: 87kg  Vital Signs: Temp: 98.1 F (36.7 C) (06/04 1326) Temp Source: Oral (06/04 1326) BP: 141/74 (06/04 1326) Pulse Rate: 71 (06/04 1326)  Labs: Recent Labs    10/06/18 1931 10/07/18 0104 10/07/18 0651 10/07/18 1358  HGB  --   --  12.3*  --   HCT  --   --  36.1*  --   PLT  --   --  232  --   HEPARINUNFRC  --   --   --  0.14*  CREATININE 2.28*  --  2.17*  --   TROPONINI <0.03 <0.03 <0.03  --     Estimated Creatinine Clearance: 32.7 mL/min (A) (by C-G formula based on SCr of 2.17 mg/dL (H)).   Medical History: Past Medical History:  Diagnosis Date  . ACS (acute coronary syndrome) ([place]) 10/2018  . Arthritis   . Bladder stones   . BPH (benign prostatic hyperplasia)   . CAD (coronary artery disease)    a. Pt reports prior stenting @ [facility] - no records in Epic.  Marland Kitchen Chronic kidney disease (CKD), stage III (moderate) (HCC)    dx from [place] in [place], [place]  . Heart murmur    a. AS  . Hyperlipidemia   . Hypertension   . Moderate to severe aortic stenosis    a. PER [facility]-16 [facility] [place].  Marland Kitchen Neuropathy    FEET AND HANDS  . Peripheral vascular disease ([place])    a. 04/2016 s/p L Fem->PT bypass; b. 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 04/2017 LLE Duplex: No restenosis. ABI R 0.63, ABI L 1.01.  Marland Kitchen Post traumatic stress disorder (PTSD)   . Pre-diabetes   . Sleep apnea    does not use cpap    Assessment: 73 year old male transferred on 10/06/18 from [facility] for cardiology evaluation.   Low troponin at outside hospital, was very low concern for DVT.  CBC at OSH was within normal limits, he does have CKD with a scr ~2.17.  Hgb is 12.3 this AM, pltc is wnl.  Upper extremity venous doppler negative for DVT. 2D echo  pending. Cardiology team not patient currently CP free.  Heparin level is subtherapeutic 0.14 units/ml on heparin rate 1000 unit/hr.  No bleeding reported.  Goal of Therapy:  Heparin level 0.3-0.7 units/ml Monitor platelets by anticoagulation protocol: Yes   Plan:  Increase heparin rate by 3 units/kg to 1250 units/hr Check anti-Xa level in 8 hours  Daily while on heparin Continue to monitor H&H and platelets  Nicole Cella, RPh Clinical Pharmacist 10/07/2018 3:37 PM

## 2018-10-07 NOTE — Progress Notes (Signed)
  Echocardiogram 2D Echocardiogram has been performed.  Matthew [name] 10/07/2018, 11:34 AM

## 2018-10-11 NOTE — Discharge Summary (Signed)
[facility] Discharge Summary   [name] EYC:[id number]   PCP: Charlsie Merles, MD DOB: 10/25/1945   Date of admission: 10/06/2018   Date of discharge: 10/07/2018     Discharge Diagnoses:   Principal Problem:   Chest pain not due to acute coronary syndrome Active Problems:   HTN (hypertension)   Hyperlipidemia   Carotid artery stenosis   CKD (chronic kidney disease), stage IV (HCC)   Left upper extremity swelling  Admitted From: Home Disposition:  Home   Recommendations for Outpatient Follow-up:  1. Please follow-up with PCP in 1 week as well as cardiology as recommended  Follow-up Information    Borum, Jaci Standard, MD. Schedule an appointment as soon as possible for a visit in 1 week(s).   Specialty:  Internal Medicine Contact information: [place] [address] [phone]        Arnoldo Lenis, MD.   Specialty:  Cardiology Why:  June 12th@1 :30 pm in the [place] office [phone] Contact information: [place] [address] [phone]        Charlsie Merles, MD.   Specialty:  Internal Medicine Why:  doctor's office nurse will call patient to schedule appointment Contact information: [place] [address] [phone]          Diet recommendation: Cardiac diet  Activity: The patient is advised to gradually reintroduce usual activities,as tolerated .  Discharge Condition: good  Code Status: Full code  History of present illness: As per the H and P dictated on admission, "Matthew [name] is a 73 y.o. male with medical history significant of  CKD III, CAD s/p CABG, HTN, and carotid stenosis s/p CEA who p/w 2-3 days of increasing LUE swelling, mainly the forearm. He also had chest pain at rest last night that eased off spontaneously.  He was additionally complaining of left leg swelling for 1 to 2 weeks.  He was scheduled for tooth  extractions today and was recommended to be seen prior to that.  He has bilateral eye swelling that is typically worse in the mornings and seems to improve throughout the day.  He denies feeling tightness in his face tongue or throat.  Yesterday he had a burning pain in his right mid lower chest upper abdomen after eating fried chicken, mashed potatoes and green beans.  His pain resolved prior to arrival to the emergency department.  He has had a similar episode of left hand and left leg swelling about once a year.  He has no weakness or numbness in his left arm or left leg.  He has no change in coloration of his left arm or left leg.  He denies ever having a DVT or PE.  On aspirin but denies any other anticoagulants due to his bypass grafting or valve.  Symptoms did not change with time severity was mild.  She denies associated shortness of breath or cough he was transferred to our facility for further evaluation and management by cardiology."  Hospital Course:  Summary of his active problems in the hospital is as following. 1.    Noncardiac chest pain. Coronary artery disease. Troponin x3-. EKG unremarkable. Chest pain currently resolved. No associated symptoms. Cardiology consulted. Echocardiogram performed did not show any evidence of wall motion abnormality. Currently cardiology feels that this pain is coming from noncardiac etiology and recommend outpatient follow-up.  2.  Carotid artery stenosis: Noted.    3.  Chronic  kidney disease stage IV: Patient presented to facility in [place] him with GFR of less than 30.  After treatment overnight his creatinine improved.   4.  Left upper extremity swelling: Patient also complains of left lower extremity swelling however physician at [facility] felt the patient may have an upper extremity deep venous thrombosis.   Upper extremity Doppler was negative for any DVT. Recommend elevation of the head.  5.  Hypertension: Patient's blood pressures  are very high on presentation I have ordered his home doses of medication  6.  Hyperlipidemia: Noted continue home medication management with atorvastatin.  Patient was ambulatory without any assistance. On the day of the discharge the patient's vitals were stable , and no other acute medical condition were reported by patient. the patient was felt safe to be discharge at Home with family.  Consultants: cardiology  Procedures: Echocardiogram   DISCHARGE MEDICATION: Allergies as of 10/07/2018   No Known Allergies     Medication List    TAKE these medications   acetaminophen 325 MG tablet Commonly known as:  TYLENOL Take 2 tablets (650 mg total) by mouth every 6 (six) hours as needed for mild pain.   amLODipine 10 MG tablet Commonly known as:  NORVASC Take 10 mg by mouth every morning.   aspirin EC 81 MG tablet Take 81 mg by mouth daily.   atorvastatin 80 MG tablet Commonly known as:  LIPITOR Take 1 tablet (80 mg total) by mouth daily at 6 PM.   cloNIDine 0.1 MG tablet Commonly known as:  CATAPRES Take 1 tablet (0.1 mg total) by mouth 2 (two) times daily.   ferrous sulfate 325 (65 FE) MG tablet Take 325 mg by mouth every Monday, Wednesday, and Friday.   gabapentin 600 MG tablet Commonly known as:  NEURONTIN Take 600 mg by mouth 3 (three) times daily.   latanoprost 0.005 % ophthalmic solution Commonly known as:  XALATAN Place 1 drop into both eyes at bedtime.   metoprolol tartrate 100 MG tablet Commonly known as:  LOPRESSOR Take 1 tablet (100 mg total) by mouth 2 (two) times daily.   nitroGLYCERIN 0.4 MG SL tablet Commonly known as:  NITROSTAT Place 1 tablet (0.4 mg total) under the tongue every 5 (five) minutes x 3 doses as needed for chest pain.   Omega-3 Fish Oil 300 MG Caps Take 300 mg by mouth 2 (two) times daily.   oxybutynin 5 MG tablet Commonly known as:  DITROPAN Take 1 tablet (5 mg total) by mouth every 8 (eight) hours as needed for bladder spasms.    oxyCODONE 5 MG immediate release tablet Commonly known as:  Oxy IR/ROXICODONE Take 1 tablet (5 mg total) by mouth every 6 (six) hours as needed for severe pain.   potassium citrate 10 MEQ (1080 MG) SR tablet Commonly known as:  UROCIT-K Take 10 mEq by mouth 2 (two) times daily.      No Known Allergies Discharge Instructions    Diet - low sodium heart healthy   Complete by:  As directed    Increase activity slowly   Complete by:  As directed      Discharge Exam: Filed Weights   10/06/18 1710 10/07/18 0749  Weight: 93.5 kg 91 kg   Vitals:   10/07/18 0952 10/07/18 1326  BP: (!) 161/91 (!) 141/74  Pulse: 85 71  Resp: 18 18  Temp: 98.2 F (36.8 C) 98.1 F (36.7 C)  SpO2: 98% 98%   General: Appear in no distress,  no Rash; Oral Mucosa Clear, moist. no Abnormal Mass Or lumps Cardiovascular: S1 and S2 Present, no Murmur, Respiratory: normal respiratory effort, Bilateral Air entry present and Clear to Auscultation, no Crackles, no wheezes Abdomen: Bowel Sound present, Soft and no tenderness, no hernia Extremities: no Pedal edema, no calf tenderness Neurology: alert and oriented to time, place, and person affect appropriate. normal without focal findings, mental status, speech normal, alert and oriented x3, PERLA, Motor strength 5/5 and symmetric and sensation grossly normal to light touch   The results of significant diagnostics from this hospitalization (including imaging, microbiology, ancillary and laboratory) are listed below for reference.    Significant Diagnostic Studies: Vas [place] Upper Extremity Venous Duplex  Result Date: 10/07/2018 UPPER VENOUS STUDY  Indications: Edema Performing Technologist: Abram Sander RVS  Examination Guidelines: A complete evaluation includes B-mode imaging, spectral Doppler, color Doppler, and power Doppler as needed of all accessible portions of each vessel. Bilateral testing is considered an integral part of a complete examination. Limited  examinations for reoccurring indications may be performed as noted.  Right Findings: +----------+------------+---------+-----------+----------+-------+  RIGHT      Compressible Phasicity Spontaneous Properties Summary  +----------+------------+---------+-----------+----------+-------+  Subclavian     Full        Yes        Yes                         +----------+------------+---------+-----------+----------+-------+  Left Findings: +----------+------------+---------+-----------+----------+-------+  LEFT       Compressible Phasicity Spontaneous Properties Summary  +----------+------------+---------+-----------+----------+-------+  IJV            Full        Yes        Yes                         +----------+------------+---------+-----------+----------+-------+  Subclavian     Full        Yes        Yes                         +----------+------------+---------+-----------+----------+-------+  Axillary       Full        Yes        Yes                         +----------+------------+---------+-----------+----------+-------+  Brachial       Full        Yes        Yes                         +----------+------------+---------+-----------+----------+-------+  Radial         Full                                               +----------+------------+---------+-----------+----------+-------+  Ulnar          Full                                               +----------+------------+---------+-----------+----------+-------+  Cephalic       Full                                               +----------+------------+---------+-----------+----------+-------+  Basilic        Full                                               +----------+------------+---------+-----------+----------+-------+  Summary:  Right: No evidence of thrombosis in the subclavian.  Left: No evidence of deep vein thrombosis in the upper extremity. No evidence of superficial vein thrombosis in the upper extremity.  *See table(s) above for measurements and  observations.  Diagnosing physician: Monica Martinez MD Electronically signed by Monica Martinez MD on 10/07/2018 at 5:27:46 PM.    Final     Microbiology: Recent Results (from the past 240 hour(s))  SARS Coronavirus 2 (CEPHEID - Performed in [facility] hospital lab), Hosp Order     Status: None   Collection Time: 10/06/18  8:20 PM  Result Value Ref Range Status   SARS Coronavirus 2 NEGATIVE NEGATIVE Final    Comment: (NOTE) If result is NEGATIVE SARS-CoV-2 target nucleic acids are NOT DETECTED. The SARS-CoV-2 RNA is generally detectable in upper and lower  respiratory specimens during the acute phase of infection. The lowest  concentration of SARS-CoV-2 viral copies this assay can detect is 250  copies / mL. A negative result does not preclude SARS-CoV-2 infection  and should not be used as the sole basis for treatment or other  patient management decisions.  A negative result may occur with  improper specimen collection / handling, submission of specimen other  than nasopharyngeal swab, presence of viral mutation(s) within the  areas targeted by this assay, and inadequate number of viral copies  (<250 copies / mL). A negative result must be combined with clinical  observations, patient history, and epidemiological information. If result is POSITIVE SARS-CoV-2 target nucleic acids are DETECTED. The SARS-CoV-2 RNA is generally detectable in upper and lower  respiratory specimens dur ing the acute phase of infection.  Positive  results are indicative of active infection with SARS-CoV-2.  Clinical  correlation with patient history and other diagnostic information is  necessary to determine patient infection status.  Positive results do  not rule out bacterial infection or co-infection with other viruses. If result is PRESUMPTIVE POSTIVE SARS-CoV-2 nucleic acids MAY BE PRESENT.   A presumptive positive result was obtained on the submitted specimen  and confirmed on repeat testing.   While 2019 novel coronavirus  (SARS-CoV-2) nucleic acids may be present in the submitted sample  additional confirmatory testing may be necessary for epidemiological  and / or clinical management purposes  to differentiate between  SARS-CoV-2 and other Sarbecovirus currently known to infect humans.  If clinically indicated additional testing with an alternate test  methodology [phone]) is advised. The SARS-CoV-2 RNA is generally  detectable in upper and lower respiratory sp ecimens during the acute  phase of infection. The expected result is Negative. Fact Sheet for Patients:  StrictlyIdeas.no Fact Sheet for Healthcare Providers: BankingDealers.co.za This test is not yet approved or cleared by the [place] FDA and has been authorized for detection and/or diagnosis of SARS-CoV-2 by FDA under an Emergency Use Authorization (EUA).  This EUA will remain in effect (meaning this test can be used) for the duration of the COVID-19 declaration under Section 564(b)(1) of the Act, 21 U.S.C. section 360bbb-3(b)(1), unless the authorization is terminated or revoked sooner. Performed at [facility], [place] [address], [place], [place] [address]  Labs: CBC: Recent Labs  Lab 10/07/18 0651  WBC 6.0  HGB 12.3*  HCT 36.1*  MCV 90.5  PLT 396   Basic Metabolic Panel: Recent Labs  Lab 10/06/18 1931 10/07/18 0651  NA 138 136  K 4.4 4.4  CL 103 102  CO2 21* 22  GLUCOSE 188* 117*  BUN 20 20  CREATININE 2.28* 2.17*  CALCIUM 9.2 8.9   Liver Function Tests: No results for input(s): AST, ALT, ALKPHOS, BILITOT, PROT, ALBUMIN in the last 168 hours. No results for input(s): LIPASE, AMYLASE in the last 168 hours. No results for input(s): AMMONIA in the last 168 hours. Cardiac Enzymes: Recent Labs  Lab 10/06/18 1931 10/07/18 0104 10/07/18 0651  TROPONINI <0.03 <0.03 <0.03   BNP (last 3 results) No results for input(s): BNP in  the last 8760 hours. CBG: No results for input(s): GLUCAP in the last 168 hours. Time spent: 35 minutes  Signed:  Berle Mull  [facility] 10/07/2018

## 2018-10-12 ENCOUNTER — Telehealth: Payer: Self-pay | Admitting: Physician Assistant

## 2018-10-12 NOTE — Telephone Encounter (Signed)
Virtual Visit Pre-Appointment Phone Call  "(Name), I am calling you today to discuss your upcoming appointment. We are currently trying to limit exposure to the virus that causes COVID-19 by seeing patients at home rather than in the office."  1. "What is the BEST phone number to call the day of the visit?" -   2. Do you have or have access to (through a family member/friend) a smartphone with video capability that we can use for your visit?" a. If yes - list this number in appt notes as cell (if different from BEST phone #) and list the appointment type as a VIDEO visit in appointment notes b. If no - list the appointment type as a PHONE visit in appointment notes  3. Confirm consent - "In the setting of the current Covid19 crisis, you are scheduled for a (phone or video) visit with your provider on (date) at (time).  Just as we do with many in-office visits, in order for you to participate in this visit, we must obtain consent.  If you'd like, I can send this to your mychart (if signed up) or email for you to review.  Otherwise, I can obtain your verbal consent now.  All virtual visits are billed to your insurance company just like a normal visit would be.  By agreeing to a virtual visit, we'd like you to understand that the technology does not allow for your provider to perform an examination, and thus may limit your provider's ability to fully assess your condition. If your provider identifies any concerns that need to be evaluated in person, we will make arrangements to do so.  Finally, though the technology is pretty good, we cannot assure that it will always work on either your or our end, and in the setting of a video visit, we may have to convert it to a phone-only visit.  In either situation, we cannot ensure that we have a secure connection.  Are you willing to proceed?" STAFF: Did the patient verbally acknowledge consent to telehealth visit? Document YES/NO here: YES  4. Advise  patient to be prepared - "Two hours prior to your appointment, go ahead and check your blood pressure, pulse, oxygen saturation, and your weight (if you have the equipment to check those) and write them all down. When your visit starts, your provider will ask you for this information. If you have an Apple Watch or Kardia device, please plan to have heart rate information ready on the day of your appointment. Please have a pen and paper handy nearby the day of the visit as well."  5. Give patient instructions for MyChart download to smartphone OR [email or web address] as below if video visit (depending on what platform provider is using)  6. Inform patient they will receive a phone call 15 minutes prior to their appointment time (may be from unknown caller ID) so they should be prepared to answer    TELEPHONE CALL NOTE  Matthew [name] has been deemed a candidate for a follow-up tele-health visit to limit community exposure during the Covid-19 pandemic. I spoke with the patient via phone to ensure availability of phone/video source, confirm preferred email & phone number, and discuss instructions and expectations.  I reminded Matthew [name] to be prepared with any vital sign and/or heart rhythm information that could potentially be obtained via home monitoring, at the time of his visit. I reminded Matthew [name] to expect a phone call prior to his visit.  Matthew  T [name] 10/12/2018 8:30 AM   INSTRUCTIONS FOR DOWNLOADING THE MYCHART APP TO SMARTPHONE  - The patient must first make sure to have activated MyChart and know their login information - If Apple, go to [employer] and type in MyChart in the search bar and download the app. If Android, ask patient to go to [employer] and type in [place] in the search bar and download the app. The app is free but as with any other app downloads, their phone may require them to verify saved payment information or Apple/Android password.  - The patient will  need to then log into the app with their MyChart username and password, and select [facility] as their healthcare provider to link the account. When it is time for your visit, go to the MyChart app, find appointments, and click Begin Video Visit. Be sure to Select Allow for your device to access the Microphone and Camera for your visit. You will then be connected, and your provider will be with you shortly.  **If they have any issues connecting, or need assistance please contact MyChart service desk [phone])**  **If using a computer, in order to ensure the best quality for their visit they will need to use either of the following Internet Browsers: [employer], or Google Chrome**  IF USING DOXIMITY or DOXY.ME - The patient will receive a link just prior to their visit by text.     FULL LENGTH CONSENT FOR TELE-HEALTH VISIT   I hereby voluntarily request, consent and authorize [facility] and its employed or contracted physicians, physician assistants, nurse practitioners or other licensed health care professionals (the Practitioner), to provide me with telemedicine health care services (the Services") as deemed necessary by the treating Practitioner. I acknowledge and consent to receive the Services by the Practitioner via telemedicine. I understand that the telemedicine visit will involve communicating with the Practitioner through live audiovisual communication technology and the disclosure of certain medical information by electronic transmission. I acknowledge that I have been given the opportunity to request an in-person assessment or other available alternative prior to the telemedicine visit and am voluntarily participating in the telemedicine visit.  I understand that I have the right to withhold or withdraw my consent to the use of telemedicine in the course of my care at any time, without affecting my right to future care or treatment, and that the Practitioner or I  may terminate the telemedicine visit at any time. I understand that I have the right to inspect all information obtained and/or recorded in the course of the telemedicine visit and may receive copies of available information for a reasonable fee.  I understand that some of the potential risks of receiving the Services via telemedicine include:   Delay or interruption in medical evaluation due to technological equipment failure or disruption;  Information transmitted may not be sufficient (e.g. poor resolution of images) to allow for appropriate medical decision making by the Practitioner; and/or   In rare instances, security protocols could fail, causing a breach of personal health information.  Furthermore, I acknowledge that it is my responsibility to provide information about my medical history, conditions and care that is complete and accurate to the best of my ability. I acknowledge that Practitioner's advice, recommendations, and/or decision may be based on factors not within their control, such as incomplete or inaccurate data provided by me or distortions of diagnostic images or specimens that may result from electronic transmissions. I understand that the practice  of medicine is not an Chief Strategy Officer and that Practitioner makes no warranties or guarantees regarding treatment outcomes. I acknowledge that I will receive a copy of this consent concurrently upon execution via email to the email address I last provided but may also request a printed copy by calling the office of [facility].    I understand that my insurance will be billed for this visit.   I have read or had this consent read to me.  I understand the contents of this consent, which adequately explains the benefits and risks of the Services being provided via telemedicine.   I have been provided ample opportunity to ask questions regarding this consent and the Services and have had my questions answered to my satisfaction.  I  give my informed consent for the services to be provided through the use of telemedicine in my medical care  By participating in this telemedicine visit I agree to the above.

## 2018-10-15 ENCOUNTER — Other Ambulatory Visit: Payer: Self-pay

## 2018-10-15 ENCOUNTER — Encounter: Payer: Self-pay | Admitting: Physician Assistant

## 2018-10-15 ENCOUNTER — Telehealth (INDEPENDENT_AMBULATORY_CARE_PROVIDER_SITE_OTHER): Payer: Medicare Other | Admitting: Physician Assistant

## 2018-10-15 VITALS — Ht 68.0 in | Wt 213.0 lb

## 2018-10-15 DIAGNOSIS — R079 Chest pain, unspecified: Secondary | ICD-10-CM | POA: Diagnosis not present

## 2018-10-15 DIAGNOSIS — K219 Gastro-esophageal reflux disease without esophagitis: Secondary | ICD-10-CM

## 2018-10-15 DIAGNOSIS — Z952 Presence of prosthetic heart valve: Secondary | ICD-10-CM
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
## 2018-10-15 NOTE — Patient Instructions (Signed)
Medication Instructions:  Your physician recommends that you continue on your current medications as directed. Please refer to the Current Medication list given to you today.  If you need a refill on your cardiac medications before your next appointment, please call your pharmacy.   Lab work: NONE   If you have labs (blood work) drawn today and your tests are completely normal, you will receive your results only by: Marland Kitchen MyChart Message (if you have MyChart) OR . A paper copy in the mail If you have any lab test that is abnormal or we need to change your treatment, we will call you to review the results.  Testing/Procedures: NONE   Follow-Up: At [facility], you and your health needs are our priority.  As part of our continuing mission to provide you with exceptional heart care, we have created designated Provider Care Teams.  These Care Teams include your primary Cardiologist (physician) and Advanced Practice Providers (APPs -  Physician Assistants and Nurse Practitioners) who all work together to provide you with the care you need, when you need it. You will need a follow up appointment in  August.  Please call our office 2 months in advance to schedule this appointment.  You may see Carlyle Dolly, MD or one of the following Advanced Practice Providers on your designated Care Team:   Bernerd Pho, PA-C [facility]) . Ermalinda Barrios, PA-C ([place])  Any Other Special Instructions Will Be Listed Below (If Applicable).  Continue to increase activity.  Call office if weight continues to increase.   Thank you for choosing [facility]!

## 2018-11-18 ENCOUNTER — Telehealth: Payer: Self-pay | Admitting: Cardiology

## 2018-11-18 DIAGNOSIS — G629 Polyneuropathy, unspecified: Secondary | ICD-10-CM | POA: Diagnosis not present

## 2018-11-18 DIAGNOSIS — N189 Chronic kidney disease, unspecified: Secondary | ICD-10-CM | POA: Diagnosis not present

## 2018-11-18 DIAGNOSIS — R195 Other fecal abnormalities: Secondary | ICD-10-CM | POA: Diagnosis not present

## 2018-11-18 DIAGNOSIS — E7800 Pure hypercholesterolemia, unspecified: Secondary | ICD-10-CM | POA: Diagnosis not present

## 2018-11-18 DIAGNOSIS — K5900 Constipation, unspecified: Secondary | ICD-10-CM | POA: Diagnosis not present

## 2018-11-18 DIAGNOSIS — Z87820 Personal history of traumatic brain injury: Secondary | ICD-10-CM | POA: Diagnosis not present

## 2018-11-18 DIAGNOSIS — Z79899 Other long term (current) drug therapy: Secondary | ICD-10-CM | POA: Diagnosis not present

## 2018-11-18 DIAGNOSIS — I129 Hypertensive chronic kidney disease with stage 1 through stage 4 chronic kidney disease, or unspecified chronic kidney disease: Secondary | ICD-10-CM | POA: Diagnosis not present

## 2018-11-18 DIAGNOSIS — J449 Chronic obstructive pulmonary disease, unspecified: Secondary | ICD-10-CM | POA: Diagnosis not present

## 2018-11-18 DIAGNOSIS — Z7982 Long term (current) use of aspirin: Secondary | ICD-10-CM | POA: Diagnosis not present

## 2018-11-18 NOTE — Telephone Encounter (Signed)
Pt is impacted and wanting to know what would be a safe OTC for him to take.

## 2018-11-18 NOTE — Telephone Encounter (Signed)
Called pt. Spoke with his aide, she states that he has not had a bowel movement in many days. He does not have a pcp. He is also followed by the [place], but she stated she never gets a call back. She also called his pharmacist, but they would not advise anything as they do not know his complete medical history.  She would like to know what he can take OTC to help with his issue. Please advise.

## 2018-11-19 NOTE — Telephone Encounter (Signed)
Called pt. No answer, no voicemail

## 2018-11-19 NOTE — Telephone Encounter (Signed)
Can try over the counter miralax. Needs to be sure to being staying well hydrated and having high fiber in his diet. Could also take a fiber supplement like metamucil if not getting enough in his diet    Zandra Abts MD

## 2018-11-22 NOTE — Telephone Encounter (Signed)
No answer, no machine.

## 2018-12-16 ENCOUNTER — Telehealth (INDEPENDENT_AMBULATORY_CARE_PROVIDER_SITE_OTHER): Payer: Medicare Other | Admitting: Cardiology

## 2018-12-16 ENCOUNTER — Encounter: Payer: Self-pay | Admitting: Cardiology

## 2018-12-16 VITALS — BP 139/75 | HR 64 | Ht 68.0 in | Wt 213.0 lb

## 2018-12-16 DIAGNOSIS — R600 Localized edema: Secondary | ICD-10-CM

## 2018-12-16 MED ORDER — FUROSEMIDE 20 MG PO TABS: ORAL_TABLET | ORAL | 3 refills | Status: DC

## 2018-12-16 NOTE — Addendum Note (Signed)
Addended byDebbora Lacrosse R on: 12/16/2018 10:08 AM   Modules accepted: Orders

## 2018-12-16 NOTE — Patient Instructions (Signed)
Medication Instructions:  Start lasix 20 mg daily for next three days, then take only 20 mg daily as needed for swelling.  Labwork: none  Testing/Procedures: none  Follow-Up: Your physician recommends that you schedule a follow-up appointment in: 2 months    Any Other Special Instructions Will Be Listed Below (If Applicable).     If you need a refill on your cardiac medications before your next appointment, please call your pharmacy.
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
## 2019-01-11 ENCOUNTER — Telehealth: Payer: Self-pay | Admitting: Cardiology

## 2019-01-11 NOTE — Telephone Encounter (Signed)
Virtual Visit Pre-Appointment Phone Call  "(Name), I am calling you today to discuss your upcoming appointment. We are currently trying to limit exposure to the virus that causes COVID-19 by seeing patients at home rather than in the office."  1. "What is the BEST phone number to call the day of the visit?" - include this in appointment notes  2. Do you have or have access to (through a family member/friend) a smartphone with video capability that we can use for your visit?" a. If yes - list this number in appt notes as cell (if different from BEST phone #) and list the appointment type as a VIDEO visit in appointment notes b. If no - list the appointment type as a PHONE visit in appointment notes  3. Confirm consent - "In the setting of the current Covid19 crisis, you are scheduled for a (phone or video) visit with your provider on (date) at (time).  Just as we do with many in-office visits, in order for you to participate in this visit, we must obtain consent.  If you'd like, I can send this to your mychart (if signed up) or email for you to review.  Otherwise, I can obtain your verbal consent now.  All virtual visits are billed to your insurance company just like a normal visit would be.  By agreeing to a virtual visit, we'd like you to understand that the technology does not allow for your provider to perform an examination, and thus may limit your provider's ability to fully assess your condition. If your provider identifies any concerns that need to be evaluated in person, we will make arrangements to do so.  Finally, though the technology is pretty good, we cannot assure that it will always work on either your or our end, and in the setting of a video visit, we may have to convert it to a phone-only visit.  In either situation, we cannot ensure that we have a secure connection.  Are you willing to proceed?" STAFF: Did the patient verbally acknowledge consent to telehealth visit? Document  YES/NO here: yes  4. Advise patient to be prepared - "Two hours prior to your appointment, go ahead and check your blood pressure, pulse, oxygen saturation, and your weight (if you have the equipment to check those) and write them all down. When your visit starts, your provider will ask you for this information. If you have an Apple Watch or Kardia device, please plan to have heart rate information ready on the day of your appointment. Please have a pen and paper handy nearby the day of the visit as well."  5. Give patient instructions for MyChart download to smartphone OR [email or web address] as below if video visit (depending on what platform provider is using)  6. Inform patient they will receive a phone call 15 minutes prior to their appointment time (may be from unknown caller ID) so they should be prepared to answer    TELEPHONE CALL NOTE  ANTHONE VAUTOUR has been deemed a candidate for a follow-up tele-health visit to limit community exposure during the Covid-19 pandemic. I spoke with the patient via phone to ensure availability of phone/video source, confirm preferred email & phone number, and discuss instructions and expectations.  I reminded Matthew [name] to be prepared with any vital sign and/or heart rhythm information that could potentially be obtained via home monitoring, at the time of his visit. I reminded Matthew [name] to expect a phone call prior to  his visit.  Matthew [name] 01/11/2019 4:26 PM   INSTRUCTIONS FOR DOWNLOADING THE MYCHART APP TO SMARTPHONE  - The patient must first make sure to have activated MyChart and know their login information - If Apple, go to [employer] and type in MyChart in the search bar and download the app. If Android, ask patient to go to [employer] and type in [place] in the search bar and download the app. The app is free but as with any other app downloads, their phone may require them to verify saved payment information or Apple/Android  password.  - The patient will need to then log into the app with their MyChart username and password, and select [facility] as their healthcare provider to link the account. When it is time for your visit, go to the MyChart app, find appointments, and click Begin Video Visit. Be sure to Select Allow for your device to access the Microphone and Camera for your visit. You will then be connected, and your provider will be with you shortly.  **If they have any issues connecting, or need assistance please contact MyChart service desk [phone])**  **If using a computer, in order to ensure the best quality for their visit they will need to use either of the following Internet Browsers: [employer], or Google Chrome**  IF USING DOXIMITY or DOXY.ME - The patient will receive a link just prior to their visit by text.     FULL LENGTH CONSENT FOR TELE-HEALTH VISIT   I hereby voluntarily request, consent and authorize [facility] and its employed or contracted physicians, physician assistants, nurse practitioners or other licensed health care professionals (the Practitioner), to provide me with telemedicine health care services (the Services") as deemed necessary by the treating Practitioner. I acknowledge and consent to receive the Services by the Practitioner via telemedicine. I understand that the telemedicine visit will involve communicating with the Practitioner through live audiovisual communication technology and the disclosure of certain medical information by electronic transmission. I acknowledge that I have been given the opportunity to request an in-person assessment or other available alternative prior to the telemedicine visit and am voluntarily participating in the telemedicine visit.  I understand that I have the right to withhold or withdraw my consent to the use of telemedicine in the course of my care at any time, without affecting my right to future care or treatment,  and that the Practitioner or I may terminate the telemedicine visit at any time. I understand that I have the right to inspect all information obtained and/or recorded in the course of the telemedicine visit and may receive copies of available information for a reasonable fee.  I understand that some of the potential risks of receiving the Services via telemedicine include:   Delay or interruption in medical evaluation due to technological equipment failure or disruption;  Information transmitted may not be sufficient (e.g. poor resolution of images) to allow for appropriate medical decision making by the Practitioner; and/or   In rare instances, security protocols could fail, causing a breach of personal health information.  Furthermore, I acknowledge that it is my responsibility to provide information about my medical history, conditions and care that is complete and accurate to the best of my ability. I acknowledge that Practitioner's advice, recommendations, and/or decision may be based on factors not within their control, such as incomplete or inaccurate data provided by me or distortions of diagnostic images or specimens that may result from electronic transmissions. I  understand that the practice of medicine is not an exact science and that Practitioner makes no warranties or guarantees regarding treatment outcomes. I acknowledge that I will receive a copy of this consent concurrently upon execution via email to the email address I last provided but may also request a printed copy by calling the office of [facility].    I understand that my insurance will be billed for this visit.   I have read or had this consent read to me.  I understand the contents of this consent, which adequately explains the benefits and risks of the Services being provided via telemedicine.   I have been provided ample opportunity to ask questions regarding this consent and the Services and have had my questions  answered to my satisfaction.  I give my informed consent for the services to be provided through the use of telemedicine in my medical care  By participating in this telemedicine visit I agree to the above.

## 2019-01-18 ENCOUNTER — Ambulatory Visit: Payer: Non-veteran care | Admitting: Cardiology

## 2019-03-08 ENCOUNTER — Ambulatory Visit: Payer: Non-veteran care | Admitting: Cardiology

## 2019-03-09 ENCOUNTER — Encounter: Payer: Self-pay | Admitting: Cardiology

## 2019-03-09 ENCOUNTER — Telehealth (INDEPENDENT_AMBULATORY_CARE_PROVIDER_SITE_OTHER): Payer: Medicare Other | Admitting: Cardiology

## 2019-03-09 VITALS — BP 144/72 | HR 61 | Ht 68.0 in | Wt 213.0 lb

## 2019-03-09 DIAGNOSIS — I10 Essential (primary) hypertension: Secondary | ICD-10-CM

## 2019-03-09 DIAGNOSIS — I2510 Atherosclerotic heart disease of native coronary artery without angina pectoris: Secondary | ICD-10-CM | POA: Diagnosis not present

## 2019-03-09 DIAGNOSIS — Z952 Presence of prosthetic heart valve: Secondary | ICD-10-CM

## 2019-03-09 MED ORDER — CARVEDILOL 25 MG PO TABS: 25.0000 mg | ORAL_TABLET | Freq: Two times a day (BID) | ORAL | 1 refills | Status: DC

## 2019-03-09 NOTE — Patient Instructions (Signed)
Your physician recommends that you schedule a follow-up appointment in: Matthew [name] has recommended you make the following change in your medication:   STOP METOPROLOL   START COREG 25 MG TWICE DAILY   Thank you for choosing [facility]!!
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
## 2019-04-25 ENCOUNTER — Other Ambulatory Visit: Payer: Self-pay

## 2019-04-25 ENCOUNTER — Telehealth (HOSPITAL_COMMUNITY): Payer: Self-pay | Admitting: *Deleted

## 2019-04-25 DIAGNOSIS — I6521 Occlusion and stenosis of right carotid artery: Secondary | ICD-10-CM

## 2019-04-25 DIAGNOSIS — I779 Disorder of arteries and arterioles, unspecified: Secondary | ICD-10-CM

## 2019-04-25 NOTE — Telephone Encounter (Signed)

## 2019-04-26 ENCOUNTER — Ambulatory Visit (HOSPITAL_COMMUNITY)
Admission: RE | Admit: 2019-04-26 | Discharge: 2019-04-26 | Disposition: A | Discharge status: Home / Self Care | Payer: Medicare Other | Source: Ambulatory Visit | Attending: Family | Admitting: Family

## 2019-04-26 ENCOUNTER — Ambulatory Visit (INDEPENDENT_AMBULATORY_CARE_PROVIDER_SITE_OTHER): Payer: Medicare Other | Admitting: Vascular Surgery

## 2019-04-26 ENCOUNTER — Ambulatory Visit (INDEPENDENT_AMBULATORY_CARE_PROVIDER_SITE_OTHER)
Admission: RE | Admit: 2019-04-26 | Discharge: 2019-04-26 | Disposition: A | Discharge status: Home / Self Care | Payer: Medicare Other | Source: Ambulatory Visit | Attending: Family | Admitting: Family

## 2019-04-26 ENCOUNTER — Other Ambulatory Visit: Payer: Self-pay

## 2019-04-26 ENCOUNTER — Encounter: Payer: Self-pay | Admitting: Vascular Surgery

## 2019-04-26 VITALS — BP 153/86 | HR 68 | Temp 97.7°F | Resp 20 | Ht 68.0 in | Wt 207.4 lb

## 2019-04-26 DIAGNOSIS — I6521 Occlusion and stenosis of right carotid artery: Secondary | ICD-10-CM | POA: Diagnosis not present

## 2019-04-26 DIAGNOSIS — I779 Disorder of arteries and arterioles, unspecified: Secondary | ICD-10-CM | POA: Diagnosis not present

## 2019-04-26 NOTE — Progress Notes (Signed)
Vascular and Vein Specialist of [facility]  Patient name: Matthew [name] MRN: [id number] DOB: August 25, 1945 Sex: male  REASON FOR VISIT: Follow-up diffuse peripheral vascular occlusive disease  HPI: Matthew [name] is a 73 y.o. male today for follow-up.  He unfortunately had the sudden death of his wife in Sep 13, 2022 of this year with a brain aneurysm.  He is still obviously upset about this.  She was always with him at his visits with me.  He is status post right carotid endarterectomy severe asymptomatic disease in January 2020.  Is also status post left femoral to posterior tibial bypass with saphenous vein in 2017 and amputation of necrotic great toe at that time.  He looks quite good today with no new difficulty  Past Medical History:  Diagnosis Date  . ACS (acute coronary syndrome) ([place]) 10/2018  . Arthritis   . Bladder stones   . BPH (benign prostatic hyperplasia)   . CAD (coronary artery disease)    a. Pt reports prior stenting @ [facility] - no records in Epic.  Marland Kitchen Chronic kidney disease (CKD), stage III (moderate)    dx from [place] in [place], [place]  . Hyperlipidemia   . Hypertension   . Moderate to severe aortic stenosis    a. [facility]-16 [facility].  Marland Kitchen Neuropathy    FEET AND HANDS  . Peripheral vascular disease ([place])    a. 04/2016 s/p L Fem->PT bypass; b. 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 04/2017 LLE Duplex: No restenosis. ABI R 0.63, ABI L 1.01.  Marland Kitchen Post traumatic stress disorder (PTSD)   . Pre-diabetes   . Sleep apnea    does not use cpap    Family History  Problem Relation Age of Onset  . Leukemia Sister   . Lung cancer Brother   . Heart failure Mother        died @ 58  . Diabetes Father        died @ 69  . Heart attack Father     SOCIAL HISTORY: Social History   Tobacco Use  . Smoking status: Current Every Day Smoker    Packs/day: 0.50    Years: 50.00    Pack years: 25.00    Types: Cigarettes    Start date:  06/15/1962  . Smokeless tobacco: Never Used  Substance Use Topics  . Alcohol use: Never    No Known Allergies  Current Outpatient Medications  Medication Sig Dispense Refill  . acetaminophen (TYLENOL) 325 MG tablet Take 2 tablets (650 mg total) by mouth every 6 (six) hours as needed for mild pain.    Marland Kitchen amLODipine (NORVASC) 10 MG tablet Take 10 mg by mouth every morning.     Marland Kitchen aspirin EC 81 MG tablet Take 81 mg by mouth daily.    Marland Kitchen atorvastatin (LIPITOR) 80 MG tablet Take 1 tablet (80 mg total) by mouth daily at 6 PM. 30 tablet 1  . carvedilol (COREG) 25 MG tablet Take 1 tablet (25 mg total) by mouth 2 (two) times daily. 180 tablet 1  . cloNIDine (CATAPRES) 0.1 MG tablet Take 1 tablet (0.1 mg total) by mouth 2 (two) times daily. 60 tablet 11  . ferrous sulfate 325 (65 FE) MG tablet Take 325 mg by mouth every Monday, Wednesday, and Friday.     . furosemide (LASIX) 20 MG tablet Take 20 mg for next three days, then take only as needed for swelling. 90 tablet 3  . gabapentin (NEURONTIN) 600  MG tablet Take 600 mg by mouth 3 (three) times daily.     Marland Kitchen latanoprost (XALATAN) 0.005 % ophthalmic solution Place 1 drop into both eyes at bedtime.     . nitroGLYCERIN (NITROSTAT) 0.4 MG SL tablet Place 1 tablet (0.4 mg total) under the tongue every 5 (five) minutes x 3 doses as needed for chest pain. 30 tablet 0  . Omega-3 Fatty Acids (OMEGA-3 FISH OIL) 300 MG CAPS Take 300 mg by mouth 2 (two) times daily.    Marland Kitchen oxybutynin (DITROPAN) 5 MG tablet Take 1 tablet (5 mg total) by mouth every 8 (eight) hours as needed for bladder spasms. 30 tablet 1  . oxyCODONE (OXY IR/ROXICODONE) 5 MG immediate release tablet Take 1 tablet (5 mg total) by mouth every 6 (six) hours as needed for severe pain. 10 tablet 0  . potassium citrate (UROCIT-K) 10 MEQ (1080 MG) SR tablet Take 10 mEq by mouth 2 (two) times daily.     No current facility-administered medications for this visit.    REVIEW OF SYSTEMS:  [X]  denotes positive  finding, [ ]  denotes negative finding Cardiac  Comments:  Chest pain or chest pressure:    Shortness of breath upon exertion:    Short of breath when lying flat:    Irregular heart rhythm:        Vascular    Pain in calf, thigh, or hip brought on by ambulation:    Pain in feet at night that wakes you up from your sleep:     Blood clot in your veins:    Leg swelling:           PHYSICAL EXAM: Vitals:   04/26/19 1113 04/26/19 1115  BP: (!) 147/88 (!) 153/86  Pulse: 68   Resp: 20   Temp: 97.7 F (36.5 C)   SpO2: 100%   Weight: 207 lb 6.4 oz (94.1 kg)   Height: 5\' 8"  (1.727 m)     GENERAL: The patient is a well-nourished male, in no acute distress. The vital signs are documented above. CARDIOVASCULAR: Carotid arteries without bruits bilaterally.  Well-healed right carotid incision.  2+ radial pulses bilaterally.  Easily palpable in situ left femoral to posterior tibial graft pulse.  Moderate edema bilateral lower extremities PULMONARY: There is good air exchange  MUSCULOSKELETAL: There are no major deformities or cyanosis. NEUROLOGIC: No focal weakness or paresthesias are detected. SKIN: There are no ulcers or rashes noted. PSYCHIATRIC: The patient has a normal affect.  DATA:  Noninvasive studies today revealed widely patent right carotid endarterectomy with no significant left carotid stenosis  Duplex of his left femoral to posterior tibial bypass revealed wide patency with no evidence of stenosis.  MEDICAL ISSUES: Stable overall.  Will be seen again in 1 year with repeat carotid and left leg duplex    [name], MD [facility] Vascular and Vein Specialists of [facility] Tel [phone] Pager [phone]

## 2019-05-12 ENCOUNTER — Other Ambulatory Visit: Payer: Self-pay | Admitting: *Deleted

## 2019-05-12 DIAGNOSIS — I779 Disorder of arteries and arterioles, unspecified: Secondary | ICD-10-CM

## 2019-05-12 DIAGNOSIS — I6521 Occlusion and stenosis of right carotid artery: Secondary | ICD-10-CM

## 2019-05-12 DIAGNOSIS — I739 Peripheral vascular disease, unspecified: Secondary | ICD-10-CM

## 2019-06-16 ENCOUNTER — Other Ambulatory Visit: Payer: Self-pay

## 2019-06-16 ENCOUNTER — Ambulatory Visit: Payer: Medicare Other | Attending: Internal Medicine

## 2019-06-16 DIAGNOSIS — Z23 Encounter for immunization: Secondary | ICD-10-CM | POA: Insufficient documentation

## 2019-06-16 NOTE — Progress Notes (Signed)
   Covid-19 Vaccination Clinic  Name:  ANTOINNE SAMARRIPA    MRN: [id number] DOB: 1945-05-15  06/16/2019  Mr. Boose was observed post Covid-19 immunization for 15 minutes without incidence. He was provided with Vaccine Information Sheet and instruction to access the V-Safe system.   Mr. Eyles was instructed to call 911 with any severe reactions post vaccine: Marland Kitchen Difficulty breathing  . Swelling of your face and throat  . A fast heartbeat  . A bad rash all over your body  . Dizziness and weakness    Immunizations Administered    Name Date Dose VIS Date Route   Moderna COVID-19 Vaccine 06/16/2019  9:56 AM 0.5 mL 04/05/2019 Intramuscular   Manufacturer: Moderna   Lot: [id number]   [place]:[id number]

## 2019-07-12 ENCOUNTER — Ambulatory Visit: Payer: Medicare Other | Admitting: Cardiology
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
## 2019-07-14 IMAGING — DX DG CHEST 1V PORT
1 series · 1 of 1 positions shown · non-contrast
Comparison: Portable chest 12/18/2017 and earlier.

CLINICAL DATA: 72-year-old male postoperative day zero status post
CABG.

EXAM:
PORTABLE CHEST 1 VIEW

[chest ap]
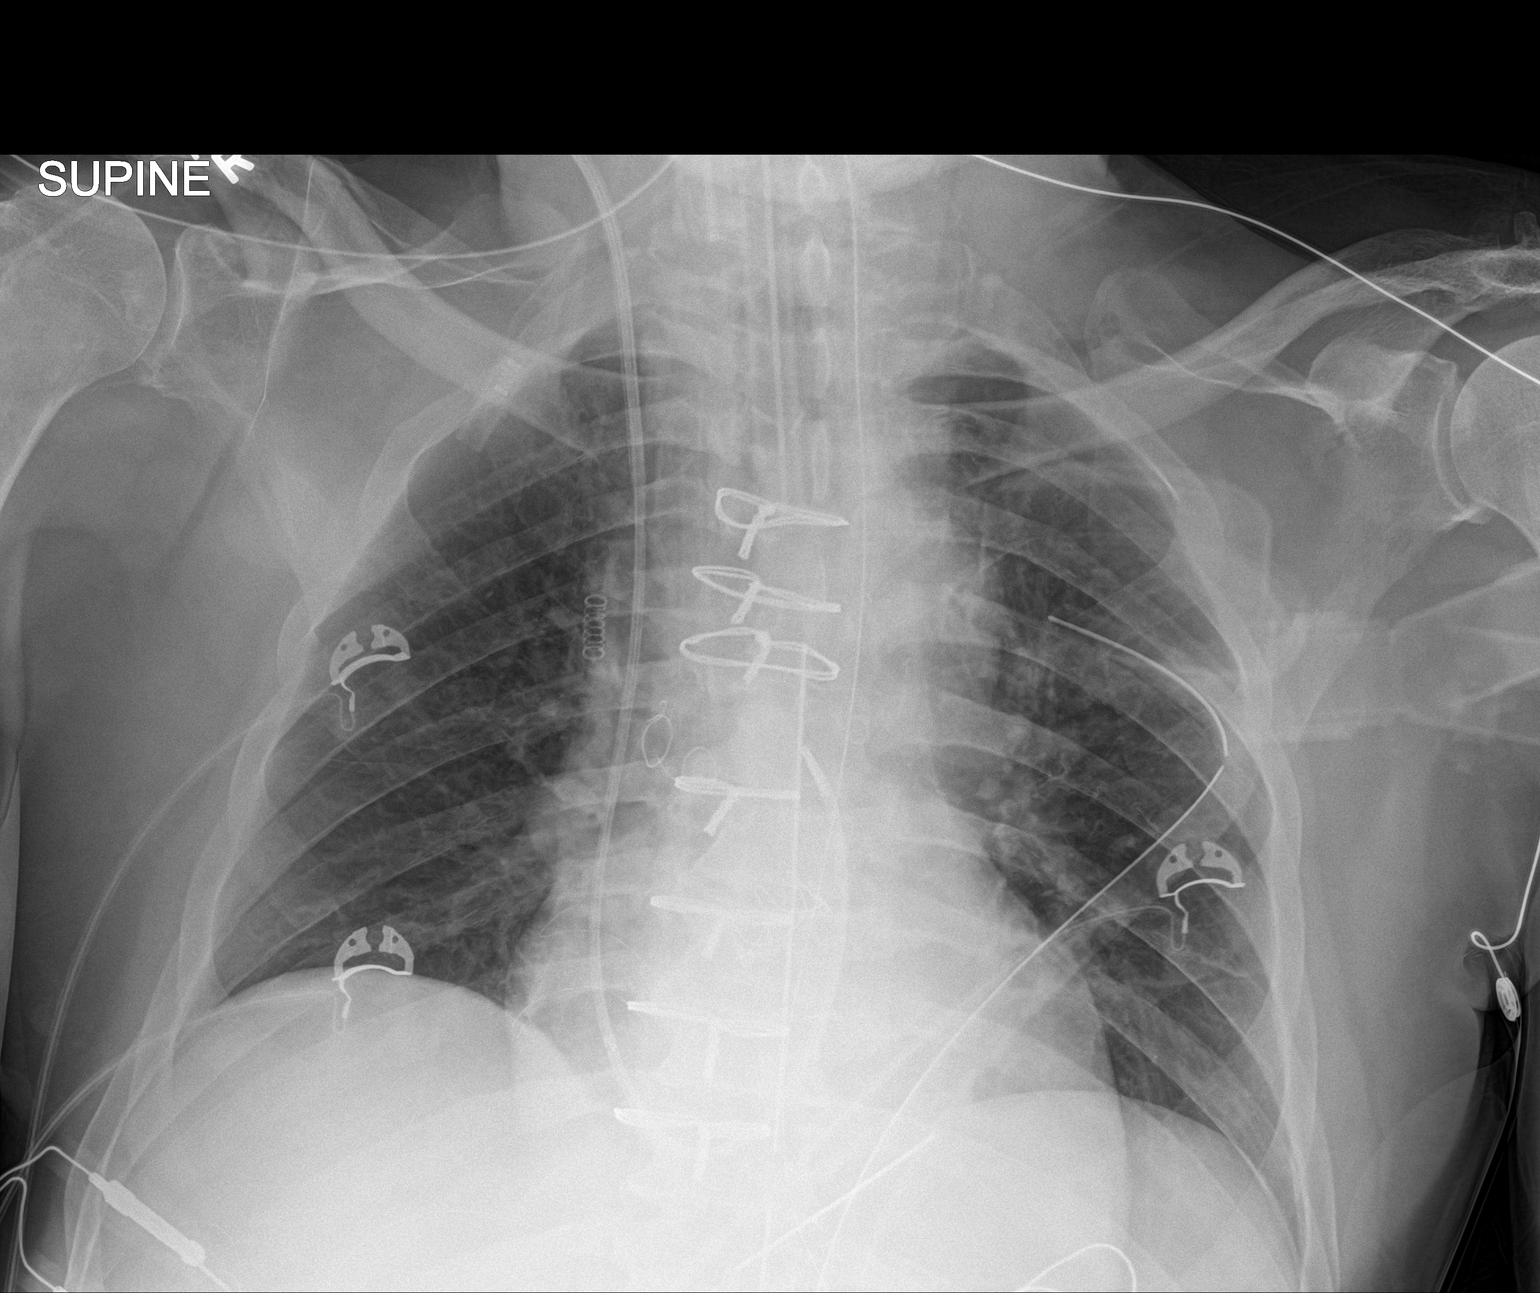

[1 of 1 positions shown; findings below may reference images not displayed]

FINDINGS: Portable AP supine view at 5171 hours. Endotracheal tube tip in good
position just below the clavicles. Enteric tube courses to the
abdomen, tip not included. 2 left chest tubes and mediastinal tube
are in place. Right IJ approach Swan-Ganz catheter tip is at the
main pulmonary artery level.

Mildly low lung volumes. Mediastinal contours are stable. Sequelae
of CABG. No pneumothorax, pulmonary edema or pleural effusion.
IMPRESSION: 1. Lines and tubes appear appropriately placed as above.
2.  No acute cardiopulmonary abnormality.

## 2019-07-18 ENCOUNTER — Ambulatory Visit: Payer: Medicare HMO | Attending: Internal Medicine

## 2019-07-18 DIAGNOSIS — Z23 Encounter for immunization: Secondary | ICD-10-CM

## 2019-07-18 NOTE — Progress Notes (Signed)
   Covid-19 Vaccination Clinic  Name:  Matthew [name]    MRN: [id number] DOB: [birth year]  07/18/2019  [name] was observed post Covid-19 immunization for 15 minutes without incident. He was provided with Vaccine Information Sheet and instruction to access the V-Safe system.   [name] was instructed to call 911 with any severe reactions post vaccine: Marland Kitchen Difficulty breathing  . Swelling of face and throat  . A fast heartbeat  . A bad rash all over body  . Dizziness and weakness   Immunizations Administered    Name Date Dose VIS Date Route   Moderna COVID-19 Vaccine 07/18/2019  9:27 AM 0.5 mL 04/05/2019 Intramuscular   Manufacturer: Moderna   Lot: [id number]   [place]: 00298-473-08

## 2019-08-09 DIAGNOSIS — R1111 Vomiting without nausea: Secondary | ICD-10-CM | POA: Diagnosis not present

## 2019-08-09 DIAGNOSIS — N200 Calculus of kidney: Secondary | ICD-10-CM | POA: Diagnosis not present

## 2019-08-09 DIAGNOSIS — Z89412 Acquired absence of left great toe: Secondary | ICD-10-CM | POA: Diagnosis not present

## 2019-08-09 DIAGNOSIS — I129 Hypertensive chronic kidney disease with stage 1 through stage 4 chronic kidney disease, or unspecified chronic kidney disease: Secondary | ICD-10-CM | POA: Diagnosis not present

## 2019-08-09 DIAGNOSIS — R112 Nausea with vomiting, unspecified: Secondary | ICD-10-CM | POA: Diagnosis not present

## 2019-08-09 DIAGNOSIS — N189 Chronic kidney disease, unspecified: Secondary | ICD-10-CM | POA: Diagnosis not present

## 2019-08-09 DIAGNOSIS — Z79899 Other long term (current) drug therapy: Secondary | ICD-10-CM | POA: Diagnosis not present

## 2019-08-09 DIAGNOSIS — J449 Chronic obstructive pulmonary disease, unspecified: Secondary | ICD-10-CM | POA: Diagnosis not present

## 2019-08-09 DIAGNOSIS — E7800 Pure hypercholesterolemia, unspecified: Secondary | ICD-10-CM | POA: Diagnosis not present

## 2019-08-09 DIAGNOSIS — K2900 Acute gastritis without bleeding: Secondary | ICD-10-CM | POA: Diagnosis not present

## 2019-08-09 DIAGNOSIS — R1013 Epigastric pain: Secondary | ICD-10-CM | POA: Diagnosis not present

## 2019-08-19 IMAGING — DX DG CHEST 2V
2 series · 2 of 2 positions shown · non-contrast
Comparison: Radiograph December 31, 2017.

CLINICAL DATA: Status post coronary artery bypass graft and aortic
valve replacement.

EXAM:
CHEST - 2 VIEW

[dg chest 2 view (1 of 2)]
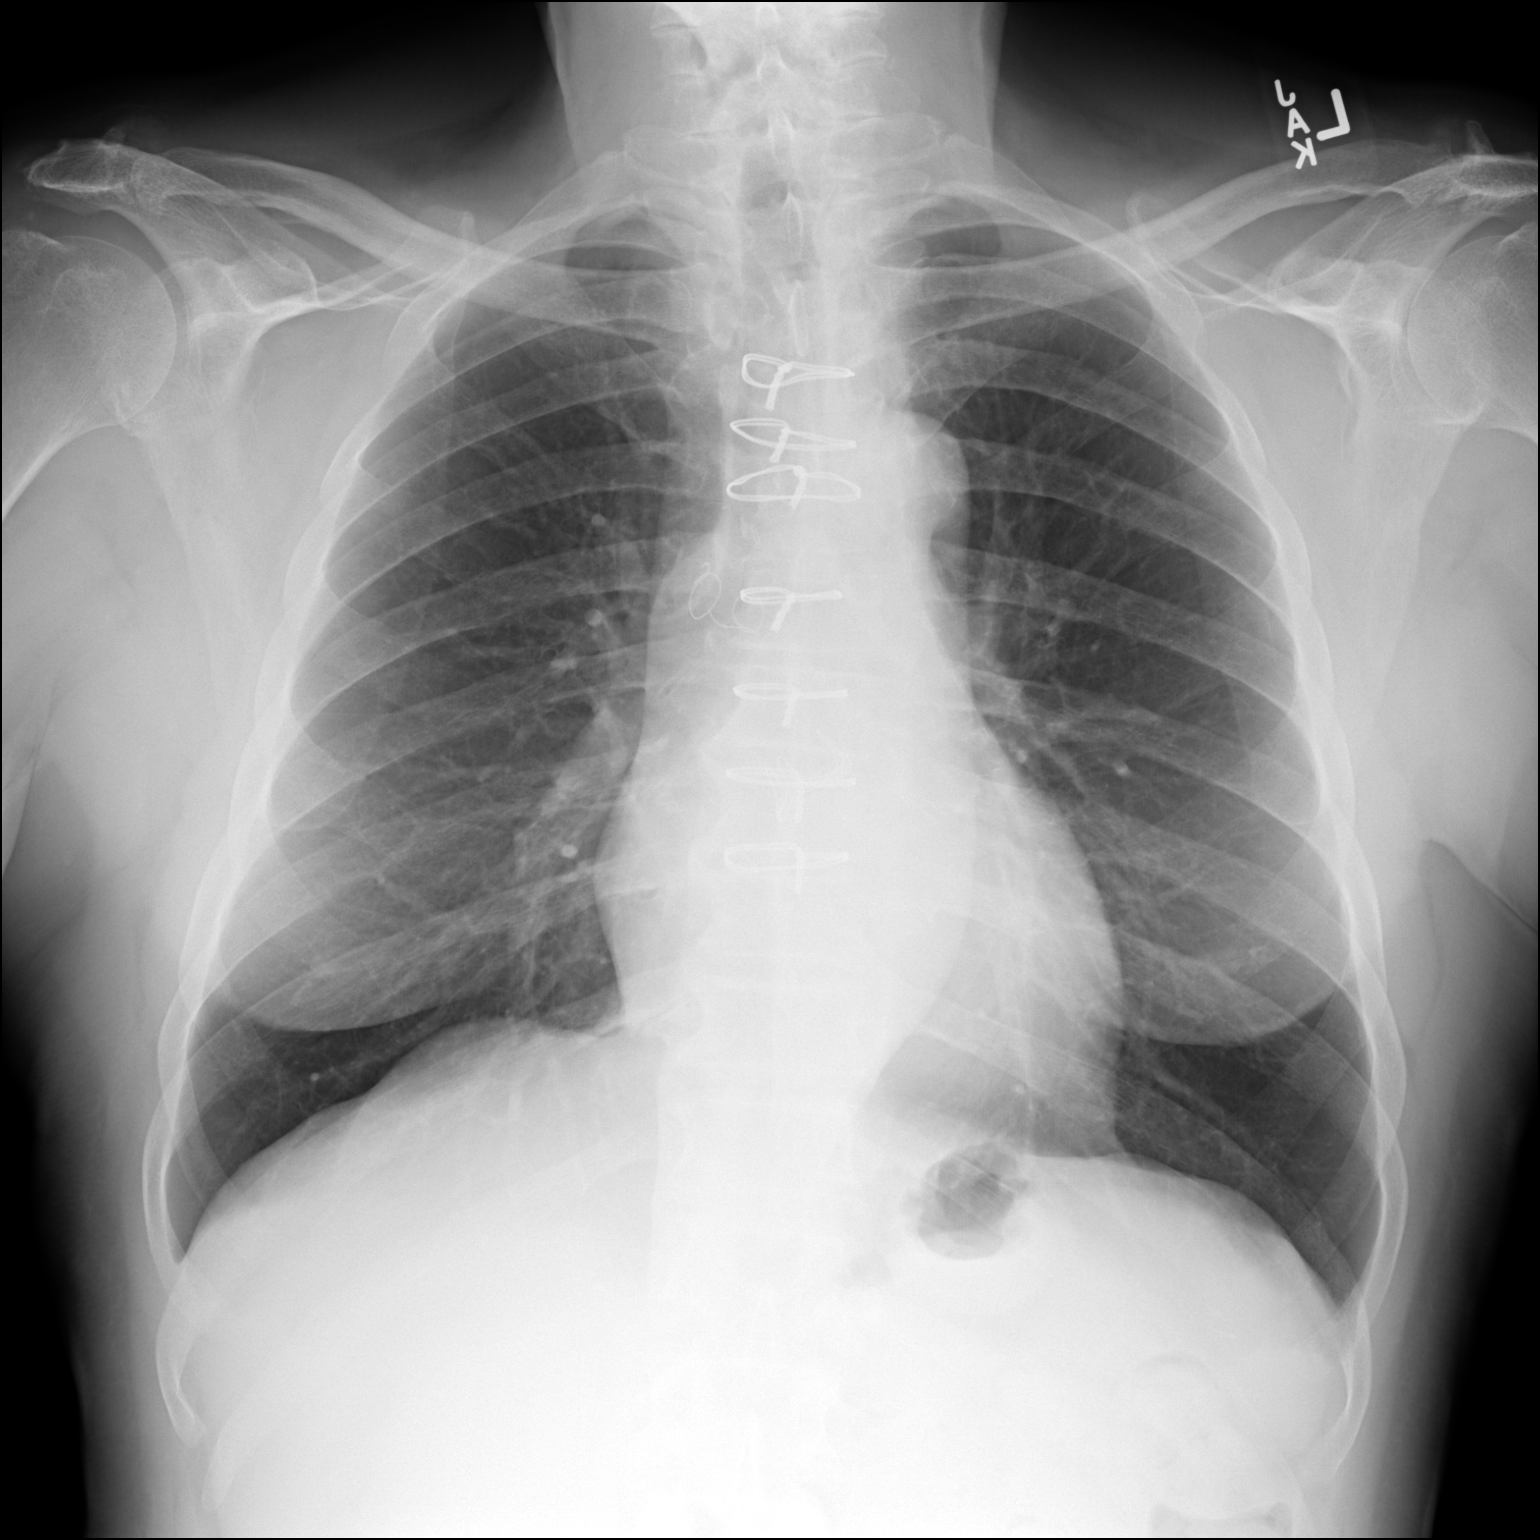

[dg chest 2 view (2 of 2)]
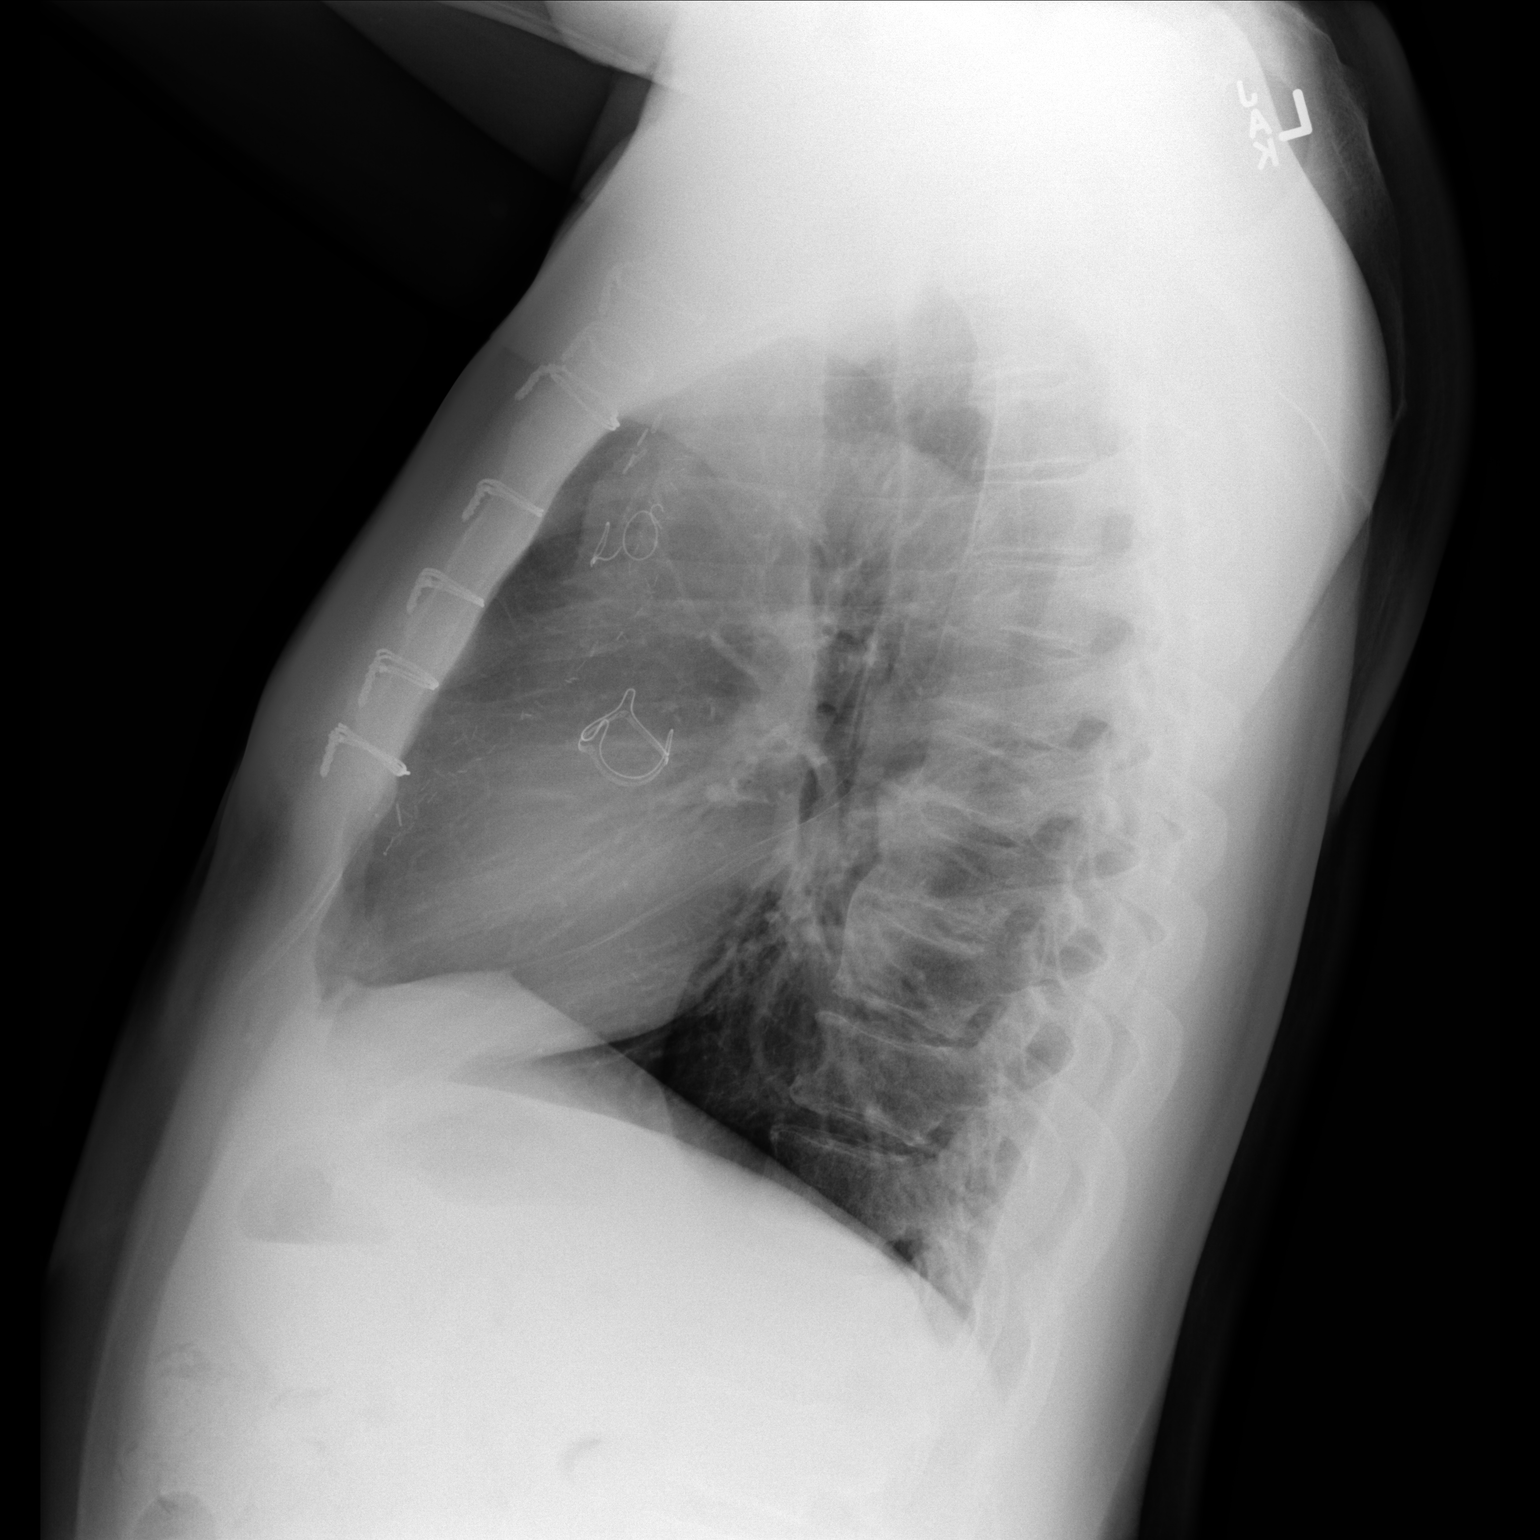

[2 of 2 positions shown; findings below may reference images not displayed]

FINDINGS: The heart size and mediastinal contours are within normal limits.
Sternotomy wires are noted. No pneumothorax or pleural effusion is
noted. Both lungs are clear. The visualized skeletal structures are
unremarkable.
IMPRESSION: No active cardiopulmonary disease.

## 2020-09-05 ENCOUNTER — Encounter (HOSPITAL_COMMUNITY): Payer: Self-pay

## 2020-09-05 ENCOUNTER — Other Ambulatory Visit: Payer: Self-pay

## 2020-09-05 ENCOUNTER — Emergency Department (HOSPITAL_COMMUNITY)
Admission: EM | Admit: 2020-09-05 | Discharge: 2020-09-05 | Disposition: A | Discharge status: Home / Self Care | Payer: No Typology Code available for payment source | Attending: Emergency Medicine | Admitting: Emergency Medicine

## 2020-09-05 DIAGNOSIS — I129 Hypertensive chronic kidney disease with stage 1 through stage 4 chronic kidney disease, or unspecified chronic kidney disease: Secondary | ICD-10-CM | POA: Diagnosis not present

## 2020-09-05 DIAGNOSIS — I2510 Atherosclerotic heart disease of native coronary artery without angina pectoris: Secondary | ICD-10-CM | POA: Diagnosis not present

## 2020-09-05 DIAGNOSIS — F17210 Nicotine dependence, cigarettes, uncomplicated: Secondary | ICD-10-CM | POA: Diagnosis not present

## 2020-09-05 DIAGNOSIS — Z7982 Long term (current) use of aspirin: Secondary | ICD-10-CM | POA: Insufficient documentation

## 2020-09-05 DIAGNOSIS — Z951 Presence of aortocoronary bypass graft: Secondary | ICD-10-CM | POA: Insufficient documentation

## 2020-09-05 DIAGNOSIS — N184 Chronic kidney disease, stage 4 (severe): Secondary | ICD-10-CM | POA: Insufficient documentation

## 2020-09-05 DIAGNOSIS — N189 Chronic kidney disease, unspecified: Secondary | ICD-10-CM

## 2020-09-05 DIAGNOSIS — Z79899 Other long term (current) drug therapy: Secondary | ICD-10-CM | POA: Diagnosis not present

## 2020-09-05 DIAGNOSIS — R799 Abnormal finding of blood chemistry, unspecified: Secondary | ICD-10-CM | POA: Diagnosis present

## 2020-09-05 LAB — COMPREHENSIVE METABOLIC PANEL
ALT: 14 U/L (ref 0–44)
AST: 17 U/L (ref 15–41)
Albumin: 3.2 g/dL — ABNORMAL LOW (ref 3.5–5.0)
Alkaline Phosphatase: 48 U/L (ref 38–126)
Anion gap: 8 (ref 5–15)
BUN: 29 mg/dL — ABNORMAL HIGH (ref 8–23)
CO2: 23 mmol/L (ref 22–32)
Calcium: 8.3 mg/dL — ABNORMAL LOW (ref 8.9–10.3)
Chloride: 109 mmol/L (ref 98–111)
Creatinine, Ser: 3.51 mg/dL — ABNORMAL HIGH (ref 0.61–1.24)
GFR, Estimated: 17 mL/min — ABNORMAL LOW (ref >60)
Glucose, Bld: 139 mg/dL — ABNORMAL HIGH (ref 70–99)
Potassium: 4.5 mmol/L (ref 3.5–5.1)
Sodium: 140 mmol/L (ref 135–145)
Total Bilirubin: 0.8 mg/dL (ref 0.3–1.2)
Total Protein: 6.3 g/dL — ABNORMAL LOW (ref 6.5–8.1)

## 2020-09-05 LAB — CBC WITH DIFFERENTIAL/PLATELET
Abs Immature Granulocytes: 0.02 10*3/uL (ref 0.00–0.07)
Basophils Absolute: 0 10*3/uL (ref 0.0–0.1)
Basophils Relative: 1 %
Eosinophils Absolute: 0.2 10*3/uL (ref 0.0–0.5)
Eosinophils Relative: 3 %
HCT: 31.6 % — ABNORMAL LOW (ref 39.0–52.0)
Hemoglobin: 9.9 g/dL — ABNORMAL LOW (ref 13.0–17.0)
Immature Granulocytes: 0 %
Lymphocytes Relative: 17 %
Lymphs Abs: 1.2 10*3/uL (ref 0.7–4.0)
MCH: 30.9 pg (ref 26.0–34.0)
MCHC: 31.3 g/dL (ref 30.0–36.0)
MCV: 98.8 fL (ref 80.0–100.0)
Monocytes Absolute: 0.6 10*3/uL (ref 0.1–1.0)
Monocytes Relative: 8 %
Neutro Abs: 4.9 10*3/uL (ref 1.7–7.7)
Neutrophils Relative %: 71 %
Platelets: 224 10*3/uL (ref 150–400)
RBC: 3.2 MIL/uL — ABNORMAL LOW (ref 4.22–5.81)
RDW: 12.3 % (ref 11.5–15.5)
WBC: 6.8 10*3/uL (ref 4.0–10.5)
nRBC: 0 % (ref 0.0–0.2)

## 2020-09-05 LAB — I-STAT CHEM 8, ED
BUN: 28 mg/dL — ABNORMAL HIGH (ref 8–23)
Calcium, Ion: 1.13 mmol/L — ABNORMAL LOW (ref 1.15–1.40)
Chloride: 106 mmol/L (ref 98–111)
Creatinine, Ser: 3.7 mg/dL — ABNORMAL HIGH (ref 0.61–1.24)
Glucose, Bld: 135 mg/dL — ABNORMAL HIGH (ref 70–99)
HCT: 29 % — ABNORMAL LOW (ref 39.0–52.0)
Hemoglobin: 9.9 g/dL — ABNORMAL LOW (ref 13.0–17.0)
Potassium: 4.4 mmol/L (ref 3.5–5.1)
Sodium: 140 mmol/L (ref 135–145)
TCO2: 23 mmol/L (ref 22–32)

## 2020-09-05 NOTE — Discharge Instructions (Addendum)
Today your potassium is normal.  Your creatinine is 3.5.  Your hemoglobin is 9.9 Please call your doctor at the [facility] and tell them these numbers

## 2020-09-05 NOTE — ED Provider Notes (Signed)
Emergency Medicine Provider Triage Evaluation Note  Matthew [name] 75 y.o. male was evaluated in triage.  Pt complains of abnormal lab.  Patient reports he had blood work drawn today.  His primary care doctor told him his potassium was high and prompted to go to the emergency department.  Patient states he has not had any symptoms.  He has never had potassium that is high before.  No new medications.  Review of Systems  Positive: Abnormal lab Negative: Chest pain, difficulty breathing, abdominal pain.  Physical Exam  BP 134/82   Pulse 70   Temp 98.2 F (36.8 C) (Oral)   Resp 18   Ht '5\' 4"'$  (1.626 m)   Wt 65.8 kg   SpO2 100%   BMI 24.89 kg/m  Gen:   Awake, no distress   HEENT:  Atraumatic  Resp:  Normal effort  Cardiac:  Normal rate  Abd:   Nondistended, nontender  MSK:   Moves extremities without difficulty  Neuro:  Speech clear   Medical Decision Making  Medically screening exam initiated at 8:57 PM  Appropriate orders placed.  Matthew [name] was informed that the remainder of the evaluation will be completed by another provider, this initial triage assessment does not replace that evaluation, and the importance of remaining in the ED until their evaluation is complete.    Clinical Impression  Abnormal lab   Portions of this note were generated with Dragon dictation software. Dictation errors may occur despite best attempts at proofreading.     Volanda Napoleon, PA-C 09/05/20 2206    Ripley Fraise, MD 09/05/20 2324

## 2020-09-05 NOTE — ED Triage Notes (Signed)
[facility] to come to [facility] (was not told what the level was). Denies any chest pain, shortness of breath. Denies any hx of kidney issues, or dialysis.

## 2020-09-05 NOTE — ED Notes (Signed)
Patient verbalized understanding of discharge instructions. Opportunity for questions and answers.  
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
## 2021-01-09 DIAGNOSIS — I739 Peripheral vascular disease, unspecified: Secondary | ICD-10-CM | POA: Diagnosis not present

## 2021-01-09 DIAGNOSIS — N281 Cyst of kidney, acquired: Secondary | ICD-10-CM | POA: Diagnosis not present

## 2021-01-09 DIAGNOSIS — M545 Low back pain, unspecified: Secondary | ICD-10-CM | POA: Diagnosis not present

## 2021-01-09 DIAGNOSIS — N200 Calculus of kidney: Secondary | ICD-10-CM | POA: Diagnosis not present

## 2021-01-09 DIAGNOSIS — I700 Atherosclerosis of aorta: Secondary | ICD-10-CM | POA: Diagnosis not present

## 2021-01-09 DIAGNOSIS — I701 Atherosclerosis of renal artery: Secondary | ICD-10-CM | POA: Diagnosis not present

## 2021-01-09 DIAGNOSIS — N3001 Acute cystitis with hematuria: Secondary | ICD-10-CM | POA: Diagnosis not present

## 2021-03-08 DIAGNOSIS — M7989 Other specified soft tissue disorders: Secondary | ICD-10-CM | POA: Diagnosis not present

## 2021-03-08 DIAGNOSIS — Z79891 Long term (current) use of opiate analgesic: Secondary | ICD-10-CM | POA: Diagnosis not present

## 2021-03-08 DIAGNOSIS — F17200 Nicotine dependence, unspecified, uncomplicated: Secondary | ICD-10-CM | POA: Diagnosis not present

## 2021-03-08 DIAGNOSIS — N184 Chronic kidney disease, stage 4 (severe): Secondary | ICD-10-CM | POA: Diagnosis not present

## 2021-03-08 DIAGNOSIS — I129 Hypertensive chronic kidney disease with stage 1 through stage 4 chronic kidney disease, or unspecified chronic kidney disease: Secondary | ICD-10-CM | POA: Diagnosis not present

## 2021-03-08 DIAGNOSIS — I96 Gangrene, not elsewhere classified: Secondary | ICD-10-CM | POA: Diagnosis not present

## 2021-03-08 DIAGNOSIS — D75839 Thrombocytosis, unspecified: Secondary | ICD-10-CM | POA: Diagnosis not present

## 2021-03-08 DIAGNOSIS — M869 Osteomyelitis, unspecified: Secondary | ICD-10-CM | POA: Diagnosis not present

## 2021-03-08 DIAGNOSIS — I252 Old myocardial infarction: Secondary | ICD-10-CM | POA: Diagnosis not present

## 2021-03-08 DIAGNOSIS — E785 Hyperlipidemia, unspecified: Secondary | ICD-10-CM | POA: Diagnosis not present

## 2021-03-08 DIAGNOSIS — I10 Essential (primary) hypertension: Secondary | ICD-10-CM | POA: Diagnosis not present

## 2021-03-08 DIAGNOSIS — W19XXXA Unspecified fall, initial encounter: Secondary | ICD-10-CM | POA: Diagnosis not present

## 2021-03-08 DIAGNOSIS — R000 Tachycardia, unspecified: Secondary | ICD-10-CM | POA: Diagnosis not present

## 2021-03-08 DIAGNOSIS — S91301A Unspecified open wound, right foot, initial encounter: Secondary | ICD-10-CM | POA: Diagnosis not present

## 2021-03-08 DIAGNOSIS — Z951 Presence of aortocoronary bypass graft: Secondary | ICD-10-CM | POA: Diagnosis not present

## 2021-03-08 DIAGNOSIS — Z20822 Contact with and (suspected) exposure to covid-19: Secondary | ICD-10-CM | POA: Diagnosis not present

## 2021-03-08 DIAGNOSIS — Z79899 Other long term (current) drug therapy: Secondary | ICD-10-CM | POA: Diagnosis not present

## 2021-03-08 DIAGNOSIS — D72829 Elevated white blood cell count, unspecified: Secondary | ICD-10-CM | POA: Diagnosis not present

## 2021-03-08 DIAGNOSIS — Z7982 Long term (current) use of aspirin: Secondary | ICD-10-CM | POA: Diagnosis not present

## 2021-03-09 DIAGNOSIS — M86671 Other chronic osteomyelitis, right ankle and foot: Secondary | ICD-10-CM | POA: Diagnosis not present

## 2021-03-27 DIAGNOSIS — B952 Enterococcus as the cause of diseases classified elsewhere: Secondary | ICD-10-CM | POA: Diagnosis not present

## 2021-03-27 DIAGNOSIS — S98132S Complete traumatic amputation of one left lesser toe, sequela: Secondary | ICD-10-CM | POA: Diagnosis not present

## 2021-03-27 DIAGNOSIS — Z4781 Encounter for orthopedic aftercare following surgical amputation: Secondary | ICD-10-CM | POA: Diagnosis not present

## 2021-03-27 DIAGNOSIS — Z79899 Other long term (current) drug therapy: Secondary | ICD-10-CM | POA: Diagnosis not present

## 2021-03-27 DIAGNOSIS — E538 Deficiency of other specified B group vitamins: Secondary | ICD-10-CM | POA: Diagnosis not present

## 2021-03-27 DIAGNOSIS — D519 Vitamin B12 deficiency anemia, unspecified: Secondary | ICD-10-CM | POA: Diagnosis not present

## 2021-03-27 DIAGNOSIS — M86171 Other acute osteomyelitis, right ankle and foot: Secondary | ICD-10-CM | POA: Diagnosis not present

## 2021-03-27 DIAGNOSIS — D631 Anemia in chronic kidney disease: Secondary | ICD-10-CM | POA: Diagnosis not present

## 2021-03-27 DIAGNOSIS — R5381 Other malaise: Secondary | ICD-10-CM | POA: Diagnosis not present

## 2021-03-27 DIAGNOSIS — D509 Iron deficiency anemia, unspecified: Secondary | ICD-10-CM | POA: Diagnosis not present

## 2021-03-27 DIAGNOSIS — I739 Peripheral vascular disease, unspecified: Secondary | ICD-10-CM | POA: Diagnosis not present

## 2021-03-27 DIAGNOSIS — M62838 Other muscle spasm: Secondary | ICD-10-CM | POA: Diagnosis not present

## 2021-03-27 DIAGNOSIS — I129 Hypertensive chronic kidney disease with stage 1 through stage 4 chronic kidney disease, or unspecified chronic kidney disease: Secondary | ICD-10-CM | POA: Diagnosis not present

## 2021-03-27 DIAGNOSIS — Z951 Presence of aortocoronary bypass graft: Secondary | ICD-10-CM | POA: Diagnosis not present

## 2021-03-27 DIAGNOSIS — M1030 Gout due to renal impairment, unspecified site: Secondary | ICD-10-CM | POA: Diagnosis not present

## 2021-03-27 DIAGNOSIS — I252 Old myocardial infarction: Secondary | ICD-10-CM | POA: Diagnosis not present

## 2021-03-27 DIAGNOSIS — Z89421 Acquired absence of other right toe(s): Secondary | ICD-10-CM | POA: Diagnosis not present

## 2021-03-27 DIAGNOSIS — Z89422 Acquired absence of other left toe(s): Secondary | ICD-10-CM | POA: Diagnosis not present

## 2021-03-27 DIAGNOSIS — Z7982 Long term (current) use of aspirin: Secondary | ICD-10-CM | POA: Diagnosis not present

## 2021-03-27 DIAGNOSIS — F17200 Nicotine dependence, unspecified, uncomplicated: Secondary | ICD-10-CM | POA: Diagnosis not present

## 2021-03-27 DIAGNOSIS — N184 Chronic kidney disease, stage 4 (severe): Secondary | ICD-10-CM | POA: Diagnosis not present

## 2021-03-27 DIAGNOSIS — Z7902 Long term (current) use of antithrombotics/antiplatelets: Secondary | ICD-10-CM | POA: Diagnosis not present

## 2021-03-27 DIAGNOSIS — M869 Osteomyelitis, unspecified: Secondary | ICD-10-CM | POA: Diagnosis not present

## 2021-03-27 DIAGNOSIS — I10 Essential (primary) hypertension: Secondary | ICD-10-CM | POA: Diagnosis not present

## 2021-03-27 DIAGNOSIS — B957 Other staphylococcus as the cause of diseases classified elsewhere: Secondary | ICD-10-CM | POA: Diagnosis not present

## 2021-03-27 DIAGNOSIS — Z952 Presence of prosthetic heart valve: Secondary | ICD-10-CM | POA: Diagnosis not present

## 2021-03-27 DIAGNOSIS — G629 Polyneuropathy, unspecified: Secondary | ICD-10-CM | POA: Diagnosis not present

## 2021-03-27 DIAGNOSIS — Z452 Encounter for adjustment and management of vascular access device: Secondary | ICD-10-CM | POA: Diagnosis not present

## 2021-03-27 DIAGNOSIS — I2510 Atherosclerotic heart disease of native coronary artery without angina pectoris: Secondary | ICD-10-CM | POA: Diagnosis not present

## 2021-03-27 DIAGNOSIS — L97513 Non-pressure chronic ulcer of other part of right foot with necrosis of muscle: Secondary | ICD-10-CM | POA: Diagnosis not present

## 2021-03-27 DIAGNOSIS — D649 Anemia, unspecified: Secondary | ICD-10-CM | POA: Diagnosis not present

## 2021-03-27 DIAGNOSIS — E785 Hyperlipidemia, unspecified: Secondary | ICD-10-CM | POA: Diagnosis not present

## 2021-03-27 DIAGNOSIS — Z89412 Acquired absence of left great toe: Secondary | ICD-10-CM | POA: Diagnosis not present

## 2021-03-27 DIAGNOSIS — Z792 Long term (current) use of antibiotics: Secondary | ICD-10-CM | POA: Diagnosis not present

## 2021-03-27 DIAGNOSIS — E559 Vitamin D deficiency, unspecified: Secondary | ICD-10-CM | POA: Diagnosis not present

## 2021-03-29 DIAGNOSIS — I10 Essential (primary) hypertension: Secondary | ICD-10-CM | POA: Diagnosis not present

## 2021-03-29 DIAGNOSIS — G629 Polyneuropathy, unspecified: Secondary | ICD-10-CM | POA: Diagnosis not present

## 2021-03-29 DIAGNOSIS — R5381 Other malaise: Secondary | ICD-10-CM | POA: Diagnosis not present

## 2021-03-29 DIAGNOSIS — I2510 Atherosclerotic heart disease of native coronary artery without angina pectoris: Secondary | ICD-10-CM | POA: Diagnosis not present

## 2021-03-29 DIAGNOSIS — M869 Osteomyelitis, unspecified: Secondary | ICD-10-CM | POA: Diagnosis not present

## 2021-04-02 DIAGNOSIS — N184 Chronic kidney disease, stage 4 (severe): Secondary | ICD-10-CM | POA: Diagnosis not present

## 2021-04-02 DIAGNOSIS — I10 Essential (primary) hypertension: Secondary | ICD-10-CM | POA: Diagnosis not present

## 2021-04-02 DIAGNOSIS — D649 Anemia, unspecified: Secondary | ICD-10-CM | POA: Diagnosis not present

## 2021-04-02 DIAGNOSIS — M869 Osteomyelitis, unspecified: Secondary | ICD-10-CM | POA: Diagnosis not present

## 2021-04-02 DIAGNOSIS — I739 Peripheral vascular disease, unspecified: Secondary | ICD-10-CM | POA: Diagnosis not present

## 2021-04-02 DIAGNOSIS — D519 Vitamin B12 deficiency anemia, unspecified: Secondary | ICD-10-CM | POA: Diagnosis not present

## 2021-04-02 DIAGNOSIS — Z89422 Acquired absence of other left toe(s): Secondary | ICD-10-CM | POA: Diagnosis not present

## 2021-04-04 DIAGNOSIS — D649 Anemia, unspecified: Secondary | ICD-10-CM | POA: Diagnosis not present

## 2021-04-08 DIAGNOSIS — R5381 Other malaise: Secondary | ICD-10-CM | POA: Diagnosis not present

## 2021-04-08 DIAGNOSIS — N184 Chronic kidney disease, stage 4 (severe): Secondary | ICD-10-CM | POA: Diagnosis not present

## 2021-04-08 DIAGNOSIS — G629 Polyneuropathy, unspecified: Secondary | ICD-10-CM | POA: Diagnosis not present

## 2021-04-08 DIAGNOSIS — I10 Essential (primary) hypertension: Secondary | ICD-10-CM | POA: Diagnosis not present

## 2021-04-09 DIAGNOSIS — I739 Peripheral vascular disease, unspecified: Secondary | ICD-10-CM | POA: Diagnosis not present

## 2021-04-09 DIAGNOSIS — Z792 Long term (current) use of antibiotics: Secondary | ICD-10-CM | POA: Diagnosis not present

## 2021-04-09 DIAGNOSIS — M86171 Other acute osteomyelitis, right ankle and foot: Secondary | ICD-10-CM | POA: Diagnosis not present

## 2021-04-10 DIAGNOSIS — Z79899 Other long term (current) drug therapy: Secondary | ICD-10-CM | POA: Diagnosis not present

## 2021-04-15 DIAGNOSIS — I10 Essential (primary) hypertension: Secondary | ICD-10-CM | POA: Diagnosis not present

## 2021-04-18 DIAGNOSIS — Z89422 Acquired absence of other left toe(s): Secondary | ICD-10-CM | POA: Diagnosis not present

## 2021-04-18 DIAGNOSIS — R5381 Other malaise: Secondary | ICD-10-CM | POA: Diagnosis not present

## 2021-04-18 DIAGNOSIS — L97513 Non-pressure chronic ulcer of other part of right foot with necrosis of muscle: Secondary | ICD-10-CM | POA: Diagnosis not present

## 2021-04-18 DIAGNOSIS — G629 Polyneuropathy, unspecified: Secondary | ICD-10-CM | POA: Diagnosis not present

## 2021-04-18 DIAGNOSIS — I10 Essential (primary) hypertension: Secondary | ICD-10-CM | POA: Diagnosis not present

## 2021-04-18 DIAGNOSIS — M869 Osteomyelitis, unspecified: Secondary | ICD-10-CM | POA: Diagnosis not present

## 2021-04-18 DIAGNOSIS — D519 Vitamin B12 deficiency anemia, unspecified: Secondary | ICD-10-CM | POA: Diagnosis not present

## 2021-04-18 DIAGNOSIS — D649 Anemia, unspecified: Secondary | ICD-10-CM | POA: Diagnosis not present

## 2021-04-18 DIAGNOSIS — I2510 Atherosclerotic heart disease of native coronary artery without angina pectoris: Secondary | ICD-10-CM | POA: Diagnosis not present

## 2021-04-18 DIAGNOSIS — I739 Peripheral vascular disease, unspecified: Secondary | ICD-10-CM | POA: Diagnosis not present

## 2021-04-18 DIAGNOSIS — S98132S Complete traumatic amputation of one left lesser toe, sequela: Secondary | ICD-10-CM | POA: Diagnosis not present

## 2021-04-22 DIAGNOSIS — Z452 Encounter for adjustment and management of vascular access device: Secondary | ICD-10-CM | POA: Diagnosis not present

## 2021-04-23 DIAGNOSIS — Z89421 Acquired absence of other right toe(s): Secondary | ICD-10-CM | POA: Diagnosis not present

## 2021-04-23 DIAGNOSIS — M869 Osteomyelitis, unspecified: Secondary | ICD-10-CM | POA: Diagnosis not present

## 2021-04-23 DIAGNOSIS — Z4781 Encounter for orthopedic aftercare following surgical amputation: Secondary | ICD-10-CM | POA: Diagnosis not present

## 2021-05-02 DIAGNOSIS — I739 Peripheral vascular disease, unspecified: Secondary | ICD-10-CM | POA: Diagnosis not present

## 2021-05-02 DIAGNOSIS — I70235 Atherosclerosis of native arteries of right leg with ulceration of other part of foot: Secondary | ICD-10-CM | POA: Diagnosis not present

## 2021-05-02 DIAGNOSIS — L97513 Non-pressure chronic ulcer of other part of right foot with necrosis of muscle: Secondary | ICD-10-CM | POA: Diagnosis not present

## 2021-05-02 DIAGNOSIS — Z89421 Acquired absence of other right toe(s): Secondary | ICD-10-CM | POA: Diagnosis not present

## 2021-05-16 DIAGNOSIS — Z89421 Acquired absence of other right toe(s): Secondary | ICD-10-CM | POA: Diagnosis not present

## 2021-05-16 DIAGNOSIS — S98132D Complete traumatic amputation of one left lesser toe, subsequent encounter: Secondary | ICD-10-CM | POA: Diagnosis not present

## 2021-05-16 DIAGNOSIS — L97513 Non-pressure chronic ulcer of other part of right foot with necrosis of muscle: Secondary | ICD-10-CM | POA: Diagnosis not present

## 2021-05-16 DIAGNOSIS — I70235 Atherosclerosis of native arteries of right leg with ulceration of other part of foot: Secondary | ICD-10-CM | POA: Diagnosis not present

## 2021-05-16 DIAGNOSIS — I739 Peripheral vascular disease, unspecified: Secondary | ICD-10-CM | POA: Diagnosis not present

## 2021-05-22 DIAGNOSIS — S91301D Unspecified open wound, right foot, subsequent encounter: Secondary | ICD-10-CM | POA: Diagnosis not present

## 2021-05-22 DIAGNOSIS — Z89421 Acquired absence of other right toe(s): Secondary | ICD-10-CM | POA: Diagnosis not present

## 2021-05-22 DIAGNOSIS — L89152 Pressure ulcer of sacral region, stage 2: Secondary | ICD-10-CM | POA: Diagnosis not present

## 2021-05-22 DIAGNOSIS — L89302 Pressure ulcer of unspecified buttock, stage 2: Secondary | ICD-10-CM | POA: Diagnosis not present

## 2021-05-22 DIAGNOSIS — L97513 Non-pressure chronic ulcer of other part of right foot with necrosis of muscle: Secondary | ICD-10-CM | POA: Diagnosis not present

## 2021-05-24 DIAGNOSIS — Z4781 Encounter for orthopedic aftercare following surgical amputation: Secondary | ICD-10-CM | POA: Diagnosis not present

## 2021-05-24 DIAGNOSIS — M869 Osteomyelitis, unspecified: Secondary | ICD-10-CM | POA: Diagnosis not present

## 2021-05-24 DIAGNOSIS — Z89421 Acquired absence of other right toe(s): Secondary | ICD-10-CM | POA: Diagnosis not present

## 2021-05-29 DIAGNOSIS — S91301D Unspecified open wound, right foot, subsequent encounter: Secondary | ICD-10-CM | POA: Diagnosis not present

## 2021-05-29 DIAGNOSIS — L89312 Pressure ulcer of right buttock, stage 2: Secondary | ICD-10-CM | POA: Diagnosis not present

## 2021-05-29 DIAGNOSIS — L97513 Non-pressure chronic ulcer of other part of right foot with necrosis of muscle: Secondary | ICD-10-CM | POA: Diagnosis not present

## 2021-06-06 DIAGNOSIS — M545 Low back pain, unspecified: Secondary | ICD-10-CM | POA: Diagnosis not present

## 2021-06-06 DIAGNOSIS — Z79899 Other long term (current) drug therapy: Secondary | ICD-10-CM | POA: Diagnosis not present

## 2021-06-06 DIAGNOSIS — Z7982 Long term (current) use of aspirin: Secondary | ICD-10-CM | POA: Diagnosis not present

## 2021-06-06 DIAGNOSIS — Z951 Presence of aortocoronary bypass graft: Secondary | ICD-10-CM | POA: Diagnosis not present

## 2021-06-06 DIAGNOSIS — E785 Hyperlipidemia, unspecified: Secondary | ICD-10-CM | POA: Diagnosis not present

## 2021-06-06 DIAGNOSIS — I252 Old myocardial infarction: Secondary | ICD-10-CM | POA: Diagnosis not present

## 2021-06-06 DIAGNOSIS — I10 Essential (primary) hypertension: Secondary | ICD-10-CM | POA: Diagnosis not present

## 2021-06-06 DIAGNOSIS — F17200 Nicotine dependence, unspecified, uncomplicated: Secondary | ICD-10-CM | POA: Diagnosis not present

## 2021-06-12 DIAGNOSIS — S91301D Unspecified open wound, right foot, subsequent encounter: Secondary | ICD-10-CM | POA: Diagnosis not present

## 2021-06-12 DIAGNOSIS — L97513 Non-pressure chronic ulcer of other part of right foot with necrosis of muscle: Secondary | ICD-10-CM | POA: Diagnosis not present

## 2021-06-19 DIAGNOSIS — R234 Changes in skin texture: Secondary | ICD-10-CM | POA: Diagnosis not present

## 2021-06-19 DIAGNOSIS — S91301D Unspecified open wound, right foot, subsequent encounter: Secondary | ICD-10-CM | POA: Diagnosis not present

## 2021-06-19 DIAGNOSIS — L97513 Non-pressure chronic ulcer of other part of right foot with necrosis of muscle: Secondary | ICD-10-CM | POA: Diagnosis not present

## 2021-06-20 DIAGNOSIS — L89159 Pressure ulcer of sacral region, unspecified stage: Secondary | ICD-10-CM | POA: Diagnosis not present

## 2021-06-24 DIAGNOSIS — M869 Osteomyelitis, unspecified: Secondary | ICD-10-CM | POA: Diagnosis not present

## 2021-06-24 DIAGNOSIS — Z89421 Acquired absence of other right toe(s): Secondary | ICD-10-CM | POA: Diagnosis not present

## 2021-06-24 DIAGNOSIS — Z4781 Encounter for orthopedic aftercare following surgical amputation: Secondary | ICD-10-CM | POA: Diagnosis not present

## 2021-06-25 DIAGNOSIS — I252 Old myocardial infarction: Secondary | ICD-10-CM | POA: Diagnosis not present

## 2021-06-25 DIAGNOSIS — R0789 Other chest pain: Secondary | ICD-10-CM | POA: Diagnosis not present

## 2021-06-25 DIAGNOSIS — N202 Calculus of kidney with calculus of ureter: Secondary | ICD-10-CM | POA: Diagnosis not present

## 2021-06-25 DIAGNOSIS — F17210 Nicotine dependence, cigarettes, uncomplicated: Secondary | ICD-10-CM | POA: Diagnosis not present

## 2021-06-25 DIAGNOSIS — N200 Calculus of kidney: Secondary | ICD-10-CM | POA: Diagnosis not present

## 2021-06-25 DIAGNOSIS — I700 Atherosclerosis of aorta: Secondary | ICD-10-CM | POA: Diagnosis not present

## 2021-06-25 DIAGNOSIS — K859 Acute pancreatitis without necrosis or infection, unspecified: Secondary | ICD-10-CM | POA: Diagnosis not present

## 2021-06-25 DIAGNOSIS — R1013 Epigastric pain: Secondary | ICD-10-CM | POA: Diagnosis not present

## 2021-06-25 DIAGNOSIS — I739 Peripheral vascular disease, unspecified: Secondary | ICD-10-CM | POA: Diagnosis not present

## 2021-06-25 DIAGNOSIS — N3289 Other specified disorders of bladder: Secondary | ICD-10-CM | POA: Diagnosis not present

## 2021-06-25 DIAGNOSIS — K5790 Diverticulosis of intestine, part unspecified, without perforation or abscess without bleeding: Secondary | ICD-10-CM | POA: Diagnosis not present

## 2021-06-25 DIAGNOSIS — R109 Unspecified abdominal pain: Secondary | ICD-10-CM | POA: Diagnosis not present

## 2021-06-25 DIAGNOSIS — I10 Essential (primary) hypertension: Secondary | ICD-10-CM | POA: Diagnosis not present

## 2021-06-25 DIAGNOSIS — K6289 Other specified diseases of anus and rectum: Secondary | ICD-10-CM | POA: Diagnosis not present

## 2021-06-25 DIAGNOSIS — N281 Cyst of kidney, acquired: Secondary | ICD-10-CM | POA: Diagnosis not present

## 2021-06-25 DIAGNOSIS — E785 Hyperlipidemia, unspecified: Secondary | ICD-10-CM | POA: Diagnosis not present

## 2021-06-25 DIAGNOSIS — Z951 Presence of aortocoronary bypass graft: Secondary | ICD-10-CM | POA: Diagnosis not present

## 2021-07-22 DIAGNOSIS — M869 Osteomyelitis, unspecified: Secondary | ICD-10-CM | POA: Diagnosis not present

## 2021-07-22 DIAGNOSIS — Z89421 Acquired absence of other right toe(s): Secondary | ICD-10-CM | POA: Diagnosis not present

## 2021-07-22 DIAGNOSIS — Z4781 Encounter for orthopedic aftercare following surgical amputation: Secondary | ICD-10-CM | POA: Diagnosis not present

## 2021-08-01 DIAGNOSIS — L89159 Pressure ulcer of sacral region, unspecified stage: Secondary | ICD-10-CM | POA: Diagnosis not present

## 2021-08-05 DIAGNOSIS — R52 Pain, unspecified: Secondary | ICD-10-CM | POA: Diagnosis not present

## 2021-08-05 DIAGNOSIS — E785 Hyperlipidemia, unspecified: Secondary | ICD-10-CM | POA: Diagnosis not present

## 2021-08-05 DIAGNOSIS — S20229A Contusion of unspecified back wall of thorax, initial encounter: Secondary | ICD-10-CM | POA: Diagnosis not present

## 2021-08-05 DIAGNOSIS — S300XXA Contusion of lower back and pelvis, initial encounter: Secondary | ICD-10-CM | POA: Diagnosis not present

## 2021-08-05 DIAGNOSIS — Z743 Need for continuous supervision: Secondary | ICD-10-CM | POA: Diagnosis not present

## 2021-08-05 DIAGNOSIS — Z79899 Other long term (current) drug therapy: Secondary | ICD-10-CM | POA: Diagnosis not present

## 2021-08-05 DIAGNOSIS — M542 Cervicalgia: Secondary | ICD-10-CM | POA: Diagnosis not present

## 2021-08-05 DIAGNOSIS — S161XXA Strain of muscle, fascia and tendon at neck level, initial encounter: Secondary | ICD-10-CM | POA: Diagnosis not present

## 2021-08-05 DIAGNOSIS — I10 Essential (primary) hypertension: Secondary | ICD-10-CM | POA: Diagnosis not present

## 2021-08-05 DIAGNOSIS — Z041 Encounter for examination and observation following transport accident: Secondary | ICD-10-CM | POA: Diagnosis not present

## 2021-08-05 DIAGNOSIS — Z7982 Long term (current) use of aspirin: Secondary | ICD-10-CM | POA: Diagnosis not present

## 2021-08-05 DIAGNOSIS — F17200 Nicotine dependence, unspecified, uncomplicated: Secondary | ICD-10-CM | POA: Diagnosis not present

## 2021-08-05 DIAGNOSIS — M2578 Osteophyte, vertebrae: Secondary | ICD-10-CM | POA: Diagnosis not present

## 2021-08-05 DIAGNOSIS — M545 Low back pain, unspecified: Secondary | ICD-10-CM | POA: Diagnosis not present

## 2021-08-05 DIAGNOSIS — I252 Old myocardial infarction: Secondary | ICD-10-CM | POA: Diagnosis not present

## 2021-08-05 DIAGNOSIS — M4802 Spinal stenosis, cervical region: Secondary | ICD-10-CM | POA: Diagnosis not present

## 2021-08-28 DIAGNOSIS — L97512 Non-pressure chronic ulcer of other part of right foot with fat layer exposed: Secondary | ICD-10-CM | POA: Diagnosis not present

## 2021-08-28 DIAGNOSIS — I70445 Atherosclerosis of autologous vein bypass graft(s) of the left leg with ulceration of other part of foot: Secondary | ICD-10-CM | POA: Diagnosis not present

## 2021-08-28 DIAGNOSIS — L97524 Non-pressure chronic ulcer of other part of left foot with necrosis of bone: Secondary | ICD-10-CM | POA: Diagnosis not present

## 2021-09-17 DIAGNOSIS — I252 Old myocardial infarction: Secondary | ICD-10-CM | POA: Diagnosis not present

## 2021-09-17 DIAGNOSIS — Z79899 Other long term (current) drug therapy: Secondary | ICD-10-CM | POA: Diagnosis not present

## 2021-09-17 DIAGNOSIS — D631 Anemia in chronic kidney disease: Secondary | ICD-10-CM | POA: Diagnosis not present

## 2021-09-17 DIAGNOSIS — E559 Vitamin D deficiency, unspecified: Secondary | ICD-10-CM | POA: Diagnosis not present

## 2021-09-17 DIAGNOSIS — E538 Deficiency of other specified B group vitamins: Secondary | ICD-10-CM | POA: Diagnosis not present

## 2021-09-17 DIAGNOSIS — I70222 Atherosclerosis of native arteries of extremities with rest pain, left leg: Secondary | ICD-10-CM | POA: Diagnosis not present

## 2021-09-17 DIAGNOSIS — D509 Iron deficiency anemia, unspecified: Secondary | ICD-10-CM | POA: Diagnosis not present

## 2021-09-17 DIAGNOSIS — Z951 Presence of aortocoronary bypass graft: Secondary | ICD-10-CM | POA: Diagnosis not present

## 2021-09-17 DIAGNOSIS — I10 Essential (primary) hypertension: Secondary | ICD-10-CM | POA: Diagnosis not present

## 2021-09-17 DIAGNOSIS — D649 Anemia, unspecified: Secondary | ICD-10-CM | POA: Diagnosis not present

## 2021-09-17 DIAGNOSIS — E119 Type 2 diabetes mellitus without complications: Secondary | ICD-10-CM | POA: Diagnosis not present

## 2021-09-17 DIAGNOSIS — N184 Chronic kidney disease, stage 4 (severe): Secondary | ICD-10-CM | POA: Diagnosis not present

## 2021-09-17 DIAGNOSIS — Z952 Presence of prosthetic heart valve: Secondary | ICD-10-CM | POA: Diagnosis not present

## 2021-09-17 DIAGNOSIS — Z89412 Acquired absence of left great toe: Secondary | ICD-10-CM | POA: Diagnosis not present

## 2021-09-17 DIAGNOSIS — L97512 Non-pressure chronic ulcer of other part of right foot with fat layer exposed: Secondary | ICD-10-CM | POA: Diagnosis not present

## 2021-09-17 DIAGNOSIS — I2510 Atherosclerotic heart disease of native coronary artery without angina pectoris: Secondary | ICD-10-CM | POA: Diagnosis not present

## 2021-09-17 DIAGNOSIS — Z7982 Long term (current) use of aspirin: Secondary | ICD-10-CM | POA: Diagnosis not present

## 2021-09-17 DIAGNOSIS — I129 Hypertensive chronic kidney disease with stage 1 through stage 4 chronic kidney disease, or unspecified chronic kidney disease: Secondary | ICD-10-CM | POA: Diagnosis not present

## 2021-09-17 DIAGNOSIS — F17210 Nicotine dependence, cigarettes, uncomplicated: Secondary | ICD-10-CM | POA: Diagnosis not present

## 2021-09-17 DIAGNOSIS — M109 Gout, unspecified: Secondary | ICD-10-CM | POA: Diagnosis not present

## 2021-09-17 DIAGNOSIS — Z95820 Peripheral vascular angioplasty status with implants and grafts: Secondary | ICD-10-CM | POA: Diagnosis not present

## 2021-09-17 DIAGNOSIS — Z4781 Encounter for orthopedic aftercare following surgical amputation: Secondary | ICD-10-CM | POA: Diagnosis not present

## 2021-09-17 DIAGNOSIS — I739 Peripheral vascular disease, unspecified: Secondary | ICD-10-CM | POA: Diagnosis not present

## 2021-09-17 DIAGNOSIS — M62838 Other muscle spasm: Secondary | ICD-10-CM | POA: Diagnosis not present

## 2021-09-17 DIAGNOSIS — Z89421 Acquired absence of other right toe(s): Secondary | ICD-10-CM | POA: Diagnosis not present

## 2021-09-17 DIAGNOSIS — E785 Hyperlipidemia, unspecified: Secondary | ICD-10-CM | POA: Diagnosis not present

## 2021-09-17 DIAGNOSIS — G629 Polyneuropathy, unspecified: Secondary | ICD-10-CM | POA: Diagnosis not present

## 2021-09-17 DIAGNOSIS — R234 Changes in skin texture: Secondary | ICD-10-CM | POA: Diagnosis not present

## 2021-09-17 DIAGNOSIS — Z89422 Acquired absence of other left toe(s): Secondary | ICD-10-CM | POA: Diagnosis not present

## 2021-09-17 DIAGNOSIS — R5381 Other malaise: Secondary | ICD-10-CM | POA: Diagnosis not present

## 2021-09-19 DIAGNOSIS — I739 Peripheral vascular disease, unspecified: Secondary | ICD-10-CM | POA: Diagnosis not present

## 2021-09-19 DIAGNOSIS — I10 Essential (primary) hypertension: Secondary | ICD-10-CM | POA: Diagnosis not present

## 2021-09-19 DIAGNOSIS — I129 Hypertensive chronic kidney disease with stage 1 through stage 4 chronic kidney disease, or unspecified chronic kidney disease: Secondary | ICD-10-CM | POA: Diagnosis not present

## 2021-09-19 DIAGNOSIS — N184 Chronic kidney disease, stage 4 (severe): Secondary | ICD-10-CM | POA: Diagnosis not present

## 2021-09-24 DIAGNOSIS — E559 Vitamin D deficiency, unspecified: Secondary | ICD-10-CM | POA: Diagnosis not present

## 2021-09-24 DIAGNOSIS — E119 Type 2 diabetes mellitus without complications: Secondary | ICD-10-CM | POA: Diagnosis not present

## 2021-09-25 DIAGNOSIS — R234 Changes in skin texture: Secondary | ICD-10-CM | POA: Diagnosis not present

## 2021-09-25 DIAGNOSIS — L97512 Non-pressure chronic ulcer of other part of right foot with fat layer exposed: Secondary | ICD-10-CM | POA: Diagnosis not present

## 2021-09-27 DIAGNOSIS — D649 Anemia, unspecified: Secondary | ICD-10-CM | POA: Diagnosis not present

## 2021-09-27 DIAGNOSIS — I10 Essential (primary) hypertension: Secondary | ICD-10-CM | POA: Diagnosis not present

## 2021-09-27 DIAGNOSIS — R5381 Other malaise: Secondary | ICD-10-CM | POA: Diagnosis not present

## 2021-09-27 DIAGNOSIS — N184 Chronic kidney disease, stage 4 (severe): Secondary | ICD-10-CM | POA: Diagnosis not present

## 2021-09-27 DIAGNOSIS — I2510 Atherosclerotic heart disease of native coronary artery without angina pectoris: Secondary | ICD-10-CM | POA: Diagnosis not present

## 2021-10-01 DIAGNOSIS — D649 Anemia, unspecified: Secondary | ICD-10-CM | POA: Diagnosis not present

## 2021-10-03 DIAGNOSIS — I129 Hypertensive chronic kidney disease with stage 1 through stage 4 chronic kidney disease, or unspecified chronic kidney disease: Secondary | ICD-10-CM | POA: Diagnosis not present

## 2021-10-03 DIAGNOSIS — Z79899 Other long term (current) drug therapy: Secondary | ICD-10-CM | POA: Diagnosis not present

## 2021-10-03 DIAGNOSIS — N184 Chronic kidney disease, stage 4 (severe): Secondary | ICD-10-CM | POA: Diagnosis not present

## 2021-10-11 DIAGNOSIS — M6281 Muscle weakness (generalized): Secondary | ICD-10-CM | POA: Diagnosis not present

## 2021-10-11 DIAGNOSIS — R2689 Other abnormalities of gait and mobility: Secondary | ICD-10-CM | POA: Diagnosis not present

## 2021-10-11 DIAGNOSIS — R5381 Other malaise: Secondary | ICD-10-CM | POA: Diagnosis not present

## 2021-10-14 DIAGNOSIS — I2510 Atherosclerotic heart disease of native coronary artery without angina pectoris: Secondary | ICD-10-CM | POA: Diagnosis not present

## 2021-10-14 DIAGNOSIS — N184 Chronic kidney disease, stage 4 (severe): Secondary | ICD-10-CM | POA: Diagnosis not present

## 2021-10-14 DIAGNOSIS — R2689 Other abnormalities of gait and mobility: Secondary | ICD-10-CM | POA: Diagnosis not present

## 2021-10-14 DIAGNOSIS — R5381 Other malaise: Secondary | ICD-10-CM | POA: Diagnosis not present

## 2021-10-14 DIAGNOSIS — I10 Essential (primary) hypertension: Secondary | ICD-10-CM | POA: Diagnosis not present

## 2021-10-14 DIAGNOSIS — M6281 Muscle weakness (generalized): Secondary | ICD-10-CM | POA: Diagnosis not present

## 2021-10-14 DIAGNOSIS — G629 Polyneuropathy, unspecified: Secondary | ICD-10-CM | POA: Diagnosis not present

## 2021-10-14 DIAGNOSIS — D649 Anemia, unspecified: Secondary | ICD-10-CM | POA: Diagnosis not present

## 2021-10-15 DIAGNOSIS — R2689 Other abnormalities of gait and mobility: Secondary | ICD-10-CM | POA: Diagnosis not present

## 2021-10-15 DIAGNOSIS — R5381 Other malaise: Secondary | ICD-10-CM | POA: Diagnosis not present

## 2021-10-15 DIAGNOSIS — I10 Essential (primary) hypertension: Secondary | ICD-10-CM | POA: Diagnosis not present

## 2021-10-15 DIAGNOSIS — M6281 Muscle weakness (generalized): Secondary | ICD-10-CM | POA: Diagnosis not present

## 2021-10-16 DIAGNOSIS — R5381 Other malaise: Secondary | ICD-10-CM | POA: Diagnosis not present

## 2021-10-16 DIAGNOSIS — M6281 Muscle weakness (generalized): Secondary | ICD-10-CM | POA: Diagnosis not present

## 2021-10-16 DIAGNOSIS — R2689 Other abnormalities of gait and mobility: Secondary | ICD-10-CM | POA: Diagnosis not present

## 2021-10-17 DIAGNOSIS — M6281 Muscle weakness (generalized): Secondary | ICD-10-CM | POA: Diagnosis not present

## 2021-10-17 DIAGNOSIS — Z720 Tobacco use: Secondary | ICD-10-CM | POA: Diagnosis not present

## 2021-10-17 DIAGNOSIS — Z89412 Acquired absence of left great toe: Secondary | ICD-10-CM | POA: Diagnosis not present

## 2021-10-17 DIAGNOSIS — I252 Old myocardial infarction: Secondary | ICD-10-CM | POA: Diagnosis not present

## 2021-10-17 DIAGNOSIS — Z95820 Peripheral vascular angioplasty status with implants and grafts: Secondary | ICD-10-CM | POA: Diagnosis not present

## 2021-10-17 DIAGNOSIS — I739 Peripheral vascular disease, unspecified: Secondary | ICD-10-CM | POA: Diagnosis not present

## 2021-10-17 DIAGNOSIS — R2689 Other abnormalities of gait and mobility: Secondary | ICD-10-CM | POA: Diagnosis not present

## 2021-10-17 DIAGNOSIS — R5381 Other malaise: Secondary | ICD-10-CM | POA: Diagnosis not present

## 2021-10-17 DIAGNOSIS — I10 Essential (primary) hypertension: Secondary | ICD-10-CM | POA: Diagnosis not present

## 2021-10-18 DIAGNOSIS — R2689 Other abnormalities of gait and mobility: Secondary | ICD-10-CM | POA: Diagnosis not present

## 2021-10-18 DIAGNOSIS — M6281 Muscle weakness (generalized): Secondary | ICD-10-CM | POA: Diagnosis not present

## 2021-10-18 DIAGNOSIS — R5381 Other malaise: Secondary | ICD-10-CM | POA: Diagnosis not present

## 2021-10-21 DIAGNOSIS — R2689 Other abnormalities of gait and mobility: Secondary | ICD-10-CM | POA: Diagnosis not present

## 2021-10-21 DIAGNOSIS — M6281 Muscle weakness (generalized): Secondary | ICD-10-CM | POA: Diagnosis not present

## 2021-10-21 DIAGNOSIS — R5381 Other malaise: Secondary | ICD-10-CM | POA: Diagnosis not present

## 2021-10-22 DIAGNOSIS — R5381 Other malaise: Secondary | ICD-10-CM | POA: Diagnosis not present

## 2021-10-22 DIAGNOSIS — M6281 Muscle weakness (generalized): Secondary | ICD-10-CM | POA: Diagnosis not present

## 2021-10-22 DIAGNOSIS — R2689 Other abnormalities of gait and mobility: Secondary | ICD-10-CM | POA: Diagnosis not present

## 2021-10-23 DIAGNOSIS — M6281 Muscle weakness (generalized): Secondary | ICD-10-CM | POA: Diagnosis not present

## 2021-10-23 DIAGNOSIS — R2689 Other abnormalities of gait and mobility: Secondary | ICD-10-CM | POA: Diagnosis not present

## 2021-10-23 DIAGNOSIS — R5381 Other malaise: Secondary | ICD-10-CM | POA: Diagnosis not present

## 2021-10-24 DIAGNOSIS — W19XXXA Unspecified fall, initial encounter: Secondary | ICD-10-CM | POA: Diagnosis not present

## 2021-10-24 DIAGNOSIS — T148XXA Other injury of unspecified body region, initial encounter: Secondary | ICD-10-CM | POA: Diagnosis not present

## 2021-10-24 DIAGNOSIS — M6281 Muscle weakness (generalized): Secondary | ICD-10-CM | POA: Diagnosis not present

## 2021-10-24 DIAGNOSIS — R2689 Other abnormalities of gait and mobility: Secondary | ICD-10-CM | POA: Diagnosis not present

## 2021-10-24 DIAGNOSIS — R5381 Other malaise: Secondary | ICD-10-CM | POA: Diagnosis not present

## 2021-10-25 DIAGNOSIS — M6281 Muscle weakness (generalized): Secondary | ICD-10-CM | POA: Diagnosis not present

## 2021-10-25 DIAGNOSIS — R5381 Other malaise: Secondary | ICD-10-CM | POA: Diagnosis not present

## 2021-10-25 DIAGNOSIS — R2689 Other abnormalities of gait and mobility: Secondary | ICD-10-CM | POA: Diagnosis not present

## 2021-11-01 DIAGNOSIS — I10 Essential (primary) hypertension: Secondary | ICD-10-CM | POA: Diagnosis not present

## 2021-11-01 DIAGNOSIS — N184 Chronic kidney disease, stage 4 (severe): Secondary | ICD-10-CM | POA: Diagnosis not present

## 2021-11-01 DIAGNOSIS — R519 Headache, unspecified: Secondary | ICD-10-CM | POA: Diagnosis not present

## 2021-11-01 DIAGNOSIS — G629 Polyneuropathy, unspecified: Secondary | ICD-10-CM | POA: Diagnosis not present

## 2021-11-19 DIAGNOSIS — I10 Essential (primary) hypertension: Secondary | ICD-10-CM | POA: Diagnosis not present

## 2021-11-19 DIAGNOSIS — N184 Chronic kidney disease, stage 4 (severe): Secondary | ICD-10-CM | POA: Diagnosis not present

## 2021-11-19 DIAGNOSIS — D649 Anemia, unspecified: Secondary | ICD-10-CM | POA: Diagnosis not present

## 2021-11-19 DIAGNOSIS — G629 Polyneuropathy, unspecified: Secondary | ICD-10-CM | POA: Diagnosis not present

## 2021-11-19 DIAGNOSIS — I2510 Atherosclerotic heart disease of native coronary artery without angina pectoris: Secondary | ICD-10-CM | POA: Diagnosis not present

## 2021-11-19 DIAGNOSIS — I739 Peripheral vascular disease, unspecified: Secondary | ICD-10-CM | POA: Diagnosis not present

## 2022-02-27 DIAGNOSIS — D631 Anemia in chronic kidney disease: Secondary | ICD-10-CM | POA: Diagnosis present

## 2022-02-27 DIAGNOSIS — Z992 Dependence on renal dialysis: Secondary | ICD-10-CM

## 2022-02-27 DIAGNOSIS — I5033 Acute on chronic diastolic (congestive) heart failure: Secondary | ICD-10-CM | POA: Diagnosis present

## 2022-02-27 DIAGNOSIS — Z6826 Body mass index (BMI) 26.0-26.9, adult: Secondary | ICD-10-CM

## 2022-02-27 DIAGNOSIS — Z952 Presence of prosthetic heart valve: Secondary | ICD-10-CM

## 2022-02-27 DIAGNOSIS — K219 Gastro-esophageal reflux disease without esophagitis: Secondary | ICD-10-CM | POA: Diagnosis present

## 2022-02-27 DIAGNOSIS — L89152 Pressure ulcer of sacral region, stage 2: Secondary | ICD-10-CM | POA: Diagnosis present

## 2022-02-27 DIAGNOSIS — J9601 Acute respiratory failure with hypoxia: Secondary | ICD-10-CM | POA: Diagnosis present

## 2022-02-27 DIAGNOSIS — N179 Acute kidney failure, unspecified: Secondary | ICD-10-CM | POA: Diagnosis present

## 2022-02-27 DIAGNOSIS — I132 Hypertensive heart and chronic kidney disease with heart failure and with stage 5 chronic kidney disease, or end stage renal disease: Secondary | ICD-10-CM | POA: Diagnosis not present

## 2022-02-27 DIAGNOSIS — M109 Gout, unspecified: Secondary | ICD-10-CM | POA: Diagnosis present

## 2022-02-27 DIAGNOSIS — E785 Hyperlipidemia, unspecified: Secondary | ICD-10-CM | POA: Diagnosis present

## 2022-02-27 DIAGNOSIS — R627 Adult failure to thrive: Secondary | ICD-10-CM | POA: Diagnosis present

## 2022-02-27 DIAGNOSIS — F4310 Post-traumatic stress disorder, unspecified: Secondary | ICD-10-CM | POA: Diagnosis present

## 2022-02-27 DIAGNOSIS — Z89412 Acquired absence of left great toe: Secondary | ICD-10-CM

## 2022-02-27 DIAGNOSIS — G629 Polyneuropathy, unspecified: Secondary | ICD-10-CM | POA: Diagnosis present

## 2022-02-27 DIAGNOSIS — N400 Enlarged prostate without lower urinary tract symptoms: Secondary | ICD-10-CM | POA: Diagnosis present

## 2022-02-27 DIAGNOSIS — Z515 Encounter for palliative care: Secondary | ICD-10-CM

## 2022-02-27 DIAGNOSIS — R7303 Prediabetes: Secondary | ICD-10-CM | POA: Diagnosis present

## 2022-02-27 DIAGNOSIS — I340 Nonrheumatic mitral (valve) insufficiency: Secondary | ICD-10-CM | POA: Diagnosis present

## 2022-02-27 DIAGNOSIS — G4730 Sleep apnea, unspecified: Secondary | ICD-10-CM | POA: Diagnosis present

## 2022-02-27 DIAGNOSIS — I739 Peripheral vascular disease, unspecified: Secondary | ICD-10-CM | POA: Diagnosis present

## 2022-02-27 DIAGNOSIS — Z7982 Long term (current) use of aspirin: Secondary | ICD-10-CM

## 2022-02-27 DIAGNOSIS — Z833 Family history of diabetes mellitus: Secondary | ICD-10-CM

## 2022-02-27 DIAGNOSIS — E872 Acidosis, unspecified: Secondary | ICD-10-CM | POA: Diagnosis not present

## 2022-02-27 DIAGNOSIS — Z20822 Contact with and (suspected) exposure to covid-19: Secondary | ICD-10-CM | POA: Diagnosis present

## 2022-02-27 DIAGNOSIS — J90 Pleural effusion, not elsewhere classified: Secondary | ICD-10-CM | POA: Diagnosis not present

## 2022-02-27 DIAGNOSIS — E440 Moderate protein-calorie malnutrition: Secondary | ICD-10-CM | POA: Diagnosis present

## 2022-02-27 DIAGNOSIS — N186 End stage renal disease: Secondary | ICD-10-CM | POA: Diagnosis present

## 2022-02-27 DIAGNOSIS — Z951 Presence of aortocoronary bypass graft: Secondary | ICD-10-CM

## 2022-02-27 DIAGNOSIS — I252 Old myocardial infarction: Secondary | ICD-10-CM

## 2022-02-27 DIAGNOSIS — I2510 Atherosclerotic heart disease of native coronary artery without angina pectoris: Secondary | ICD-10-CM | POA: Diagnosis present

## 2022-02-27 DIAGNOSIS — F17210 Nicotine dependence, cigarettes, uncomplicated: Secondary | ICD-10-CM | POA: Diagnosis present

## 2022-02-27 DIAGNOSIS — R040 Epistaxis: Secondary | ICD-10-CM | POA: Diagnosis not present

## 2022-02-27 DIAGNOSIS — R0602 Shortness of breath: Secondary | ICD-10-CM | POA: Diagnosis not present

## 2022-02-27 DIAGNOSIS — Z79899 Other long term (current) drug therapy: Secondary | ICD-10-CM

## 2022-02-27 DIAGNOSIS — Z8249 Family history of ischemic heart disease and other diseases of the circulatory system: Secondary | ICD-10-CM

## 2022-02-28 ENCOUNTER — Emergency Department (HOSPITAL_COMMUNITY): Payer: No Typology Code available for payment source

## 2022-02-28 ENCOUNTER — Other Ambulatory Visit (HOSPITAL_COMMUNITY): Payer: Self-pay | Admitting: *Deleted

## 2022-02-28 ENCOUNTER — Other Ambulatory Visit: Payer: Self-pay

## 2022-02-28 ENCOUNTER — Encounter (HOSPITAL_COMMUNITY): Payer: Self-pay

## 2022-02-28 ENCOUNTER — Inpatient Hospital Stay (HOSPITAL_COMMUNITY)
Admission: EM | Admit: 2022-02-28 | Discharge: 2022-03-11 | DRG: 291 | Disposition: A | Discharge status: Skilled Nursing Facility | Payer: No Typology Code available for payment source | Attending: Internal Medicine | Admitting: Internal Medicine

## 2022-02-28 ENCOUNTER — Inpatient Hospital Stay (HOSPITAL_COMMUNITY): Payer: No Typology Code available for payment source

## 2022-02-28 DIAGNOSIS — I5031 Acute diastolic (congestive) heart failure: Secondary | ICD-10-CM | POA: Diagnosis not present

## 2022-02-28 DIAGNOSIS — I509 Heart failure, unspecified: Secondary | ICD-10-CM

## 2022-02-28 DIAGNOSIS — G5783 Other specified mononeuropathies of bilateral lower limbs: Secondary | ICD-10-CM | POA: Diagnosis not present

## 2022-02-28 DIAGNOSIS — N186 End stage renal disease: Secondary | ICD-10-CM | POA: Diagnosis not present

## 2022-02-28 DIAGNOSIS — R0602 Shortness of breath: Secondary | ICD-10-CM | POA: Diagnosis not present

## 2022-02-28 DIAGNOSIS — E782 Mixed hyperlipidemia: Secondary | ICD-10-CM | POA: Diagnosis not present

## 2022-02-28 DIAGNOSIS — F17210 Nicotine dependence, cigarettes, uncomplicated: Secondary | ICD-10-CM | POA: Diagnosis present

## 2022-02-28 DIAGNOSIS — Z992 Dependence on renal dialysis: Secondary | ICD-10-CM | POA: Diagnosis not present

## 2022-02-28 DIAGNOSIS — Z515 Encounter for palliative care: Secondary | ICD-10-CM | POA: Diagnosis not present

## 2022-02-28 DIAGNOSIS — N281 Cyst of kidney, acquired: Secondary | ICD-10-CM | POA: Diagnosis not present

## 2022-02-28 DIAGNOSIS — J9601 Acute respiratory failure with hypoxia: Secondary | ICD-10-CM | POA: Diagnosis not present

## 2022-02-28 DIAGNOSIS — R279 Unspecified lack of coordination: Secondary | ICD-10-CM | POA: Diagnosis not present

## 2022-02-28 DIAGNOSIS — E440 Moderate protein-calorie malnutrition: Secondary | ICD-10-CM | POA: Diagnosis present

## 2022-02-28 DIAGNOSIS — Z951 Presence of aortocoronary bypass graft: Secondary | ICD-10-CM | POA: Diagnosis not present

## 2022-02-28 DIAGNOSIS — I350 Nonrheumatic aortic (valve) stenosis: Secondary | ICD-10-CM | POA: Diagnosis not present

## 2022-02-28 DIAGNOSIS — D631 Anemia in chronic kidney disease: Secondary | ICD-10-CM | POA: Diagnosis not present

## 2022-02-28 DIAGNOSIS — I5033 Acute on chronic diastolic (congestive) heart failure: Secondary | ICD-10-CM

## 2022-02-28 DIAGNOSIS — N184 Chronic kidney disease, stage 4 (severe): Secondary | ICD-10-CM | POA: Diagnosis not present

## 2022-02-28 DIAGNOSIS — J90 Pleural effusion, not elsewhere classified: Secondary | ICD-10-CM | POA: Diagnosis not present

## 2022-02-28 DIAGNOSIS — R2689 Other abnormalities of gait and mobility: Secondary | ICD-10-CM | POA: Diagnosis not present

## 2022-02-28 DIAGNOSIS — Z89412 Acquired absence of left great toe: Secondary | ICD-10-CM | POA: Diagnosis not present

## 2022-02-28 DIAGNOSIS — R809 Proteinuria, unspecified: Secondary | ICD-10-CM | POA: Diagnosis not present

## 2022-02-28 DIAGNOSIS — L8942 Pressure ulcer of contiguous site of back, buttock and hip, stage 2: Secondary | ICD-10-CM

## 2022-02-28 DIAGNOSIS — I252 Old myocardial infarction: Secondary | ICD-10-CM | POA: Diagnosis not present

## 2022-02-28 DIAGNOSIS — M109 Gout, unspecified: Secondary | ICD-10-CM | POA: Diagnosis not present

## 2022-02-28 DIAGNOSIS — Z452 Encounter for adjustment and management of vascular access device: Secondary | ICD-10-CM | POA: Diagnosis not present

## 2022-02-28 DIAGNOSIS — E785 Hyperlipidemia, unspecified: Secondary | ICD-10-CM | POA: Diagnosis present

## 2022-02-28 DIAGNOSIS — D649 Anemia, unspecified: Secondary | ICD-10-CM | POA: Diagnosis not present

## 2022-02-28 DIAGNOSIS — Z8249 Family history of ischemic heart disease and other diseases of the circulatory system: Secondary | ICD-10-CM | POA: Diagnosis not present

## 2022-02-28 DIAGNOSIS — E872 Acidosis, unspecified: Secondary | ICD-10-CM | POA: Diagnosis not present

## 2022-02-28 DIAGNOSIS — I739 Peripheral vascular disease, unspecified: Secondary | ICD-10-CM | POA: Diagnosis not present

## 2022-02-28 DIAGNOSIS — Z7189 Other specified counseling: Secondary | ICD-10-CM | POA: Diagnosis not present

## 2022-02-28 DIAGNOSIS — Z952 Presence of prosthetic heart valve: Secondary | ICD-10-CM | POA: Diagnosis not present

## 2022-02-28 DIAGNOSIS — N179 Acute kidney failure, unspecified: Secondary | ICD-10-CM | POA: Insufficient documentation

## 2022-02-28 DIAGNOSIS — I10 Essential (primary) hypertension: Secondary | ICD-10-CM | POA: Diagnosis not present

## 2022-02-28 DIAGNOSIS — M6281 Muscle weakness (generalized): Secondary | ICD-10-CM | POA: Diagnosis not present

## 2022-02-28 DIAGNOSIS — R627 Adult failure to thrive: Secondary | ICD-10-CM | POA: Diagnosis present

## 2022-02-28 DIAGNOSIS — K219 Gastro-esophageal reflux disease without esophagitis: Secondary | ICD-10-CM | POA: Diagnosis not present

## 2022-02-28 DIAGNOSIS — I132 Hypertensive heart and chronic kidney disease with heart failure and with stage 5 chronic kidney disease, or end stage renal disease: Secondary | ICD-10-CM | POA: Diagnosis present

## 2022-02-28 DIAGNOSIS — L8990 Pressure ulcer of unspecified site, unspecified stage: Secondary | ICD-10-CM | POA: Insufficient documentation

## 2022-02-28 DIAGNOSIS — L89152 Pressure ulcer of sacral region, stage 2: Secondary | ICD-10-CM | POA: Diagnosis present

## 2022-02-28 DIAGNOSIS — I2510 Atherosclerotic heart disease of native coronary artery without angina pectoris: Secondary | ICD-10-CM | POA: Diagnosis not present

## 2022-02-28 DIAGNOSIS — Z20822 Contact with and (suspected) exposure to covid-19: Secondary | ICD-10-CM | POA: Diagnosis present

## 2022-02-28 LAB — IRON AND TIBC
Iron: 31 ug/dL — ABNORMAL LOW (ref 45–182)
Saturation Ratios: 10 % — ABNORMAL LOW (ref 17.9–39.5)
TIBC: 317 ug/dL (ref 250–450)
UIBC: 286 ug/dL

## 2022-02-28 LAB — COMPREHENSIVE METABOLIC PANEL
ALT: 15 U/L (ref 0–44)
AST: 15 U/L (ref 15–41)
Albumin: 3.3 g/dL — ABNORMAL LOW (ref 3.5–5.0)
Alkaline Phosphatase: 61 U/L (ref 38–126)
Anion gap: 9 (ref 5–15)
BUN: 52 mg/dL — ABNORMAL HIGH (ref 8–23)
CO2: 18 mmol/L — ABNORMAL LOW (ref 22–32)
Calcium: 8 mg/dL — ABNORMAL LOW (ref 8.9–10.3)
Chloride: 114 mmol/L — ABNORMAL HIGH (ref 98–111)
Creatinine, Ser: 5.89 mg/dL — ABNORMAL HIGH (ref 0.61–1.24)
GFR, Estimated: 9 mL/min — ABNORMAL LOW (ref >60)
Glucose, Bld: 95 mg/dL (ref 70–99)
Potassium: 4.6 mmol/L (ref 3.5–5.1)
Sodium: 141 mmol/L (ref 135–145)
Total Bilirubin: 0.4 mg/dL (ref 0.3–1.2)
Total Protein: 6.9 g/dL (ref 6.5–8.1)

## 2022-02-28 LAB — CBC WITH DIFFERENTIAL/PLATELET
Abs Immature Granulocytes: 0.03 10*3/uL (ref 0.00–0.07)
Basophils Absolute: 0.1 10*3/uL (ref 0.0–0.1)
Basophils Relative: 1 %
Eosinophils Absolute: 0.4 10*3/uL (ref 0.0–0.5)
Eosinophils Relative: 4 %
HCT: 26.3 % — ABNORMAL LOW (ref 39.0–52.0)
Hemoglobin: 8.4 g/dL — ABNORMAL LOW (ref 13.0–17.0)
Immature Granulocytes: 0 %
Lymphocytes Relative: 14 %
Lymphs Abs: 1.2 10*3/uL (ref 0.7–4.0)
MCH: 30.7 pg (ref 26.0–34.0)
MCHC: 31.9 g/dL (ref 30.0–36.0)
MCV: 96 fL (ref 80.0–100.0)
Monocytes Absolute: 0.8 10*3/uL (ref 0.1–1.0)
Monocytes Relative: 10 %
Neutro Abs: 6.2 10*3/uL (ref 1.7–7.7)
Neutrophils Relative %: 71 %
Platelets: 198 10*3/uL (ref 150–400)
RBC: 2.74 MIL/uL — ABNORMAL LOW (ref 4.22–5.81)
RDW: 14.1 % (ref 11.5–15.5)
WBC: 8.7 10*3/uL (ref 4.0–10.5)
nRBC: 0 % (ref 0.0–0.2)

## 2022-02-28 LAB — BRAIN NATRIURETIC PEPTIDE: B Natriuretic Peptide: 875 pg/mL — ABNORMAL HIGH (ref 0.0–100.0)

## 2022-02-28 LAB — URINALYSIS, ROUTINE W REFLEX MICROSCOPIC
Bilirubin Urine: NEGATIVE
Glucose, UA: NEGATIVE mg/dL
Hgb urine dipstick: NEGATIVE
Ketones, ur: NEGATIVE mg/dL
Nitrite: NEGATIVE
Protein, ur: >=300 mg/dL — AB
Specific Gravity, Urine: 1.013 (ref 1.005–1.030)
pH: 5 (ref 5.0–8.0)

## 2022-02-28 LAB — ECHOCARDIOGRAM COMPLETE
AR max vel: 1.06 cm2
AV Area VTI: 1.24 cm2
AV Area mean vel: 1.18 cm2
AV Mean grad: 15.5 mmHg
AV Peak grad: 31.8 mmHg
Ao pk vel: 2.82 m/s
Area-P 1/2: 5.27 cm2
Height: 68 in
S' Lateral: 2.2 cm
Weight: 2880 oz

## 2022-02-28 LAB — FERRITIN: Ferritin: 112 ng/mL (ref 24–336)

## 2022-02-28 LAB — RESP PANEL BY RT-PCR (FLU A&B, COVID) ARPGX2
Influenza A by PCR: NEGATIVE
Influenza B by PCR: NEGATIVE
SARS Coronavirus 2 by RT PCR: NEGATIVE

## 2022-02-28 LAB — TSH: TSH: 3.686 u[IU]/mL (ref 0.350–4.500)

## 2022-02-28 LAB — SODIUM, URINE, RANDOM: Sodium, Ur: 119 mmol/L

## 2022-02-28 LAB — MRSA NEXT GEN BY PCR, NASAL: MRSA by PCR Next Gen: NOT DETECTED

## 2022-02-28 LAB — TROPONIN I (HIGH SENSITIVITY)
Troponin I (High Sensitivity): 5 ng/L (ref <18)
Troponin I (High Sensitivity): 5 ng/L (ref <18)

## 2022-02-28 MED ORDER — OXYBUTYNIN CHLORIDE 5 MG PO TABS: 5.0000 mg | ORAL_TABLET | Freq: Three times a day (TID) | ORAL | Status: DC | PRN

## 2022-02-28 MED ORDER — ACETAMINOPHEN 650 MG RE SUPP: 650.0000 mg | Freq: Four times a day (QID) | RECTAL | Status: DC | PRN

## 2022-02-28 MED ORDER — FUROSEMIDE 10 MG/ML IJ SOLN
60.0000 mg | Freq: Two times a day (BID) | INTRAMUSCULAR | Status: DC
  Administered 2022-02-28: 60 mg via INTRAVENOUS
  Filled 2022-02-28: qty 6

## 2022-02-28 MED ORDER — SODIUM CHLORIDE 0.9% FLUSH
3.0000 mL | Freq: Two times a day (BID) | INTRAVENOUS | Status: DC
  Administered 2022-02-28 – 2022-03-11 (×21): 3 mL via INTRAVENOUS

## 2022-02-28 MED ORDER — FAMOTIDINE 20 MG PO TABS
10.0000 mg | ORAL_TABLET | Freq: Every day | ORAL | Status: DC
  Administered 2022-02-28 – 2022-03-11 (×12): 10 mg via ORAL
  Filled 2022-02-28 (×12): qty 1

## 2022-02-28 MED ORDER — ACETAMINOPHEN 325 MG PO TABS
650.0000 mg | ORAL_TABLET | Freq: Four times a day (QID) | ORAL | Status: DC | PRN
  Administered 2022-03-04 – 2022-03-07 (×2): 650 mg via ORAL
  Filled 2022-02-28 (×2): qty 2

## 2022-02-28 MED ORDER — SODIUM CHLORIDE 0.9 % IV SOLN
250.0000 mg | Freq: Every day | INTRAVENOUS | Status: AC
  Administered 2022-02-28 – 2022-03-03 (×4): 250 mg via INTRAVENOUS
  Filled 2022-02-28 (×4): qty 20

## 2022-02-28 MED ORDER — HEPARIN SODIUM (PORCINE) 5000 UNIT/ML IJ SOLN: 5000.0000 [IU] | Freq: Three times a day (TID) | INTRAMUSCULAR | Status: DC

## 2022-02-28 MED ORDER — CLONIDINE HCL 0.1 MG PO TABS: 0.1000 mg | ORAL_TABLET | Freq: Two times a day (BID) | ORAL | Status: DC

## 2022-02-28 MED ORDER — FUROSEMIDE 10 MG/ML IJ SOLN: 160.0000 mg | Freq: Two times a day (BID) | INTRAVENOUS | Status: DC

## 2022-02-28 MED ORDER — FAMOTIDINE 20 MG PO TABS: 20.0000 mg | ORAL_TABLET | Freq: Every day | ORAL | Status: DC

## 2022-02-28 MED ORDER — ATORVASTATIN CALCIUM 40 MG PO TABS
80.0000 mg | ORAL_TABLET | Freq: Every day | ORAL | Status: DC
  Administered 2022-02-28 – 2022-03-10 (×10): 80 mg via ORAL
  Filled 2022-02-28 (×13): qty 2

## 2022-02-28 MED ORDER — ALBUTEROL SULFATE (2.5 MG/3ML) 0.083% IN NEBU: 2.5000 mg | INHALATION_SOLUTION | RESPIRATORY_TRACT | Status: DC | PRN

## 2022-02-28 MED ORDER — ONDANSETRON HCL 4 MG/2ML IJ SOLN: 4.0000 mg | Freq: Four times a day (QID) | INTRAMUSCULAR | Status: DC | PRN

## 2022-02-28 MED ORDER — ONDANSETRON HCL 4 MG PO TABS: 4.0000 mg | ORAL_TABLET | Freq: Four times a day (QID) | ORAL | Status: DC | PRN

## 2022-02-28 MED ORDER — SODIUM CHLORIDE 0.9 % IV SOLN
250.0000 mL | INTRAVENOUS | Status: DC | PRN
  Administered 2022-02-28: 250 mL via INTRAVENOUS

## 2022-02-28 MED ORDER — SODIUM CHLORIDE 0.9% FLUSH: 3.0000 mL | INTRAVENOUS | Status: DC | PRN

## 2022-02-28 MED ORDER — FEBUXOSTAT 40 MG PO TABS
40.0000 mg | ORAL_TABLET | Freq: Every day | ORAL | Status: DC
  Administered 2022-02-28 – 2022-03-11 (×12): 40 mg via ORAL
  Filled 2022-02-28 (×12): qty 1

## 2022-02-28 MED ORDER — ASPIRIN 81 MG PO TBEC
81.0000 mg | DELAYED_RELEASE_TABLET | Freq: Every day | ORAL | Status: DC
  Administered 2022-02-28 – 2022-03-11 (×12): 81 mg via ORAL
  Filled 2022-02-28 (×12): qty 1

## 2022-02-28 MED ORDER — HEPARIN SODIUM (PORCINE) 5000 UNIT/ML IJ SOLN
5000.0000 [IU] | Freq: Three times a day (TID) | INTRAMUSCULAR | Status: DC
  Administered 2022-02-28 – 2022-03-01 (×3): 5000 [IU] via SUBCUTANEOUS
  Filled 2022-02-28 (×3): qty 1

## 2022-02-28 MED ORDER — OXYCODONE HCL 5 MG PO TABS
5.0000 mg | ORAL_TABLET | ORAL | Status: DC | PRN
  Administered 2022-02-28 – 2022-03-02 (×6): 5 mg via ORAL
  Filled 2022-02-28 (×6): qty 1

## 2022-02-28 MED ORDER — ACETAMINOPHEN 325 MG PO TABS: 650.0000 mg | ORAL_TABLET | Freq: Four times a day (QID) | ORAL | Status: DC | PRN

## 2022-02-28 MED ORDER — FUROSEMIDE 10 MG/ML IJ SOLN
INTRAMUSCULAR | Status: AC
  Filled 2022-02-28: qty 20

## 2022-02-28 MED ORDER — FUROSEMIDE 10 MG/ML IJ SOLN
120.0000 mg | Freq: Two times a day (BID) | INTRAVENOUS | Status: DC
  Administered 2022-02-28 – 2022-03-03 (×6): 120 mg via INTRAVENOUS
  Filled 2022-02-28 (×11): qty 12

## 2022-02-28 MED ORDER — CARVEDILOL 12.5 MG PO TABS
25.0000 mg | ORAL_TABLET | Freq: Two times a day (BID) | ORAL | Status: DC
  Administered 2022-02-28 – 2022-03-10 (×19): 25 mg via ORAL
  Filled 2022-02-28 (×24): qty 2

## 2022-02-28 MED ORDER — FUROSEMIDE 10 MG/ML IJ SOLN: 160.0000 mg | Freq: Three times a day (TID) | INTRAVENOUS | Status: DC

## 2022-02-28 MED ORDER — AMLODIPINE BESYLATE 5 MG PO TABS
10.0000 mg | ORAL_TABLET | Freq: Every morning | ORAL | Status: DC
  Administered 2022-02-28 – 2022-03-10 (×8): 10 mg via ORAL
  Filled 2022-02-28 (×12): qty 2

## 2022-02-28 NOTE — Progress Notes (Signed)
Patient discussed with primary team. Admitted with acute on chronic HFpEF and AKI on CKD with GFR down to 9. Echo reviewed, shows LVEF 65-70% with indeterminate diastolic function but elevated left atrial pressure, normal RV, normal functioning AVR. No significant change by echo. Would defer diuretic management to nephrology given degree of renal dysfunction, not a candidate for SGLT2i given degree of renal dysfunction. Really would not have anything else to recommend from cardiac standpoint, can call us back if questions   Carlyle Dolly MD

## 2022-02-28 NOTE — Progress Notes (Signed)
*  PRELIMINARY RESULTS* Echocardiogram 2D Echocardiogram has been performed.  Samuel Germany 02/28/2022, 1:21 PM

## 2022-02-28 NOTE — ED Provider Notes (Signed)
[facility] EMERGENCY DEPARTMENT Provider Note   CSN: [id number] Arrival date & time: 02/27/22  2351     History  Chief Complaint  Patient presents with   Shortness of Breath    Matthew [name] is a 76 y.o. male.  Patient presents to the emergency department for evaluation of shortness of breath.  Patient comes to the ER by ambulance.  He called EMS because of shortness of breath, was reportedly 80% on room air.  He does not use oxygen at home.  Patient was placed on supplemental oxygen and has had some improvement.  Patient denies any history of chronic lung disease.  He does endorse swelling of his legs which is unusual for him.       Home Medications Prior to Admission medications   Medication Sig Start Date End Date Taking? Authorizing Provider  acetaminophen (TYLENOL) 325 MG tablet Take 2 tablets (650 mg total) by mouth every 6 (six) hours as needed for mild pain. 01/03/18   Elgie Collard, PA-C  amLODipine (NORVASC) 10 MG tablet Take 10 mg by mouth every morning.     [provider]  aspirin EC 81 MG tablet Take 81 mg by mouth daily.    [provider]  atorvastatin (LIPITOR) 80 MG tablet Take 1 tablet (80 mg total) by mouth daily at 6 PM. 01/03/18   Elgie Collard, PA-C  carvedilol (COREG) 25 MG tablet Take 1 tablet (25 mg total) by mouth 2 (two) times daily. 03/09/19 06/07/19  Arnoldo Lenis, MD  cloNIDine (CATAPRES) 0.1 MG tablet Take 1 tablet (0.1 mg total) by mouth 2 (two) times daily. 01/03/18   Elgie Collard, PA-C  ferrous sulfate 325 (65 FE) MG tablet Take 325 mg by mouth every Monday, Wednesday, and Friday.     [provider]  furosemide (LASIX) 20 MG tablet Take 20 mg for next three days, then take only as needed for swelling. 12/16/18   Arnoldo Lenis, MD  gabapentin (NEURONTIN) 600 MG tablet Take 600 mg by mouth 3 (three) times daily.     [provider]  latanoprost (XALATAN) 0.005 % ophthalmic solution Place 1 drop into both  eyes at bedtime.     [provider]  nitroGLYCERIN (NITROSTAT) 0.4 MG SL tablet Place 1 tablet (0.4 mg total) under the tongue every 5 (five) minutes x 3 doses as needed for chest pain. 10/07/18   Lavina Hamman, MD  Omega-3 Fatty Acids (OMEGA-3 FISH OIL) 300 MG CAPS Take 300 mg by mouth 2 (two) times daily.    [provider]  oxybutynin (DITROPAN) 5 MG tablet Take 1 tablet (5 mg total) by mouth every 8 (eight) hours as needed for bladder spasms. 12/17/17   [name], MD  oxyCODONE (OXY IR/ROXICODONE) 5 MG immediate release tablet Take 1 tablet (5 mg total) by mouth every 6 (six) hours as needed for severe pain. 05/13/18   Dagoberto Ligas, PA-C  potassium citrate (UROCIT-K) 10 MEQ (1080 MG) SR tablet Take 10 mEq by mouth 2 (two) times daily.    [provider]      Allergies    Patient has no known allergies.    Review of Systems   Review of Systems  Physical Exam Updated Vital Signs BP (!) 164/72   Pulse 65   Temp 98.3 F (36.8 C)   Resp 14   Ht 5\' 8"  (1.727 m)   Wt 81.6 kg   SpO2 100%  BMI 27.37 kg/m  Physical Exam Vitals and nursing note reviewed.  Constitutional:      General: He is not in acute distress.    Appearance: He is well-developed.  HENT:     Head: Normocephalic and atraumatic.     Mouth/Throat:     Mouth: Mucous membranes are moist.  Eyes:     General: Vision grossly intact. Gaze aligned appropriately.     Extraocular Movements: Extraocular movements intact.     Conjunctiva/sclera: Conjunctivae normal.  Cardiovascular:     Rate and Rhythm: Normal rate and regular rhythm.     Pulses: Normal pulses.     Heart sounds: Normal heart sounds, S1 normal and S2 normal. No murmur heard.    No friction rub. No gallop.  Pulmonary:     Effort: Pulmonary effort is normal. No respiratory distress.     Breath sounds: Normal breath sounds.  Abdominal:     Palpations: Abdomen is soft.     Tenderness: There is no abdominal  tenderness. There is no guarding or rebound.     Hernia: No hernia is present.  Genitourinary:    Rectum: Guaiac result positive.  Musculoskeletal:        General: No swelling.     Cervical back: Full passive range of motion without pain, normal range of motion and neck supple. No pain with movement, spinous process tenderness or muscular tenderness. Normal range of motion.     Right lower leg: Edema present.     Left lower leg: Edema present.  Skin:    General: Skin is warm and dry.     Capillary Refill: Capillary refill takes less than 2 seconds.     Findings: No ecchymosis, erythema, lesion or wound.  Neurological:     Mental Status: He is alert and oriented to person, place, and time.     GCS: GCS eye subscore is 4. GCS verbal subscore is 5. GCS motor subscore is 6.     Cranial Nerves: Cranial nerves 2-12 are intact.     Sensory: Sensation is intact.     Motor: Motor function is intact. No weakness or abnormal muscle tone.     Coordination: Coordination is intact.  Psychiatric:        Mood and Affect: Mood normal.        Speech: Speech normal.        Behavior: Behavior normal.     ED Results / Procedures / Treatments   Labs (all labs ordered are listed, but only abnormal results are displayed) Labs Reviewed  CBC WITH DIFFERENTIAL/PLATELET - Abnormal; Notable for the following components:      Result Value   RBC 2.74 (*)    Hemoglobin 8.4 (*)    HCT 26.3 (*)    All other components within normal limits  COMPREHENSIVE METABOLIC PANEL - Abnormal; Notable for the following components:   Chloride 114 (*)    CO2 18 (*)    BUN 52 (*)    Creatinine, Ser 5.89 (*)    Calcium 8.0 (*)    Albumin 3.3 (*)    GFR, Estimated 9 (*)    All other components within normal limits  URINALYSIS, ROUTINE W REFLEX MICROSCOPIC - Abnormal; Notable for the following components:   Protein, ur >=300 (*)    Leukocytes,Ua SMALL (*)    Bacteria, UA RARE (*)    All other components within normal  limits  BRAIN NATRIURETIC PEPTIDE - Abnormal; Notable for the following components:  B Natriuretic Peptide 875.0 (*)    All other components within normal limits  RESP PANEL BY RT-PCR (FLU A&B, COVID) ARPGX2  POC OCCULT BLOOD, ED  TROPONIN I (HIGH SENSITIVITY)  TROPONIN I (HIGH SENSITIVITY)    EKG None  Radiology DG Chest 2 View  Result Date: 02/28/2022 CLINICAL DATA:  Shortness of breath. EXAM: CHEST - 2 VIEW COMPARISON:  Chest radiograph dated 08/05/2021. FINDINGS: Trace pleural effusion on the lateral view. No focal consolidation, or pneumothorax. The cardiac silhouette is within limits. Median sternotomy wires and mechanical cardiac valve. No acute osseous pathology. IMPRESSION: Trace pleural effusions.  No focal consolidation. Electronically Signed   By: Anner Crete M.D.   On: 02/28/2022 01:02    Procedures Procedures    Medications Ordered in ED Medications - No data to display  ED Course/ Medical Decision Making/ A&P                           Medical Decision Making Amount and/or Complexity of Data Reviewed External Data Reviewed: labs, ECG and notes. Labs: ordered. Decision-making details documented in ED Course. Radiology: ordered and independent interpretation performed. Decision-making details documented in ED Course. ECG/medicine tests: ordered and independent interpretation performed. Decision-making details documented in ED Course.  Risk Decision regarding hospitalization.   Patient presents to the emergency department with chief complaint of shortness of breath.  Patient called EMS tonight because of difficulty breathing.  He was found to be in moderate distress with hypoxia upon arrival of first responders.  Patient has improved with supplemental oxygen.  Patient does not have asthma or COPD.  Lungs are clear at arrival.  Differential diagnosis considered is broad, includes but not limited to pneumonia, CHF, ACS, viral URI including COVID.  Patient  noted to have significant pitting edema of both lower extremities as well as his hands at arrival.  He reports that this is more than usual.  Reviewing his external records reveals that he does have a history of Lasix use as needed for swelling of his legs.  He has a history of stage III chronic renal insufficiency.  Chest x-ray performed at arrival does not show obvious pathology.  No pneumothorax, no pneumonia, no pulmonary edema.  BNP is elevated at 875.  I do not have any prior values for BNP.  Troponin is normal.  Comprehensive metabolic panel reveals worsening of his renal function.  Baseline creatinine was in the threes 1 year ago, now 5.89 today.  BUN is low.  Urinalysis shows greater than 300 protein in the urine, no signs of infection.  Patient had echo in 2020 that showed normal ejection fraction.  Presentation still concerning for congestive heart failure currently.  He does have significant proteinuria as well which is likely a cause of his hypoalbuminemia and diffuse swelling.        Final Clinical Impression(s) / ED Diagnoses Final diagnoses:  SOB (shortness of breath)  Proteinuria, unspecified type    Rx / DC Orders ED Discharge Orders     None         Shawn Dannenberg, Gwenyth Allegra, MD 02/28/22 [phone]

## 2022-02-28 NOTE — Consult Note (Signed)
Nephrology Consult   Requesting provider: Barton Dubois Service requesting consult: Hospitalist Reason for consult: AKI on CKD IV   Assessment/Recommendations: Matthew [name] is a/an 76 y.o. male with a past medical history HTN, HLD, CAD, MI, s/p CABG, s/p AVR, CKD IV, PAD who present w/ SOB/CP and AKI   Non-Oliguric AKI on CKD IV: BL Crt ~3.3-3.5 as of May but no recent labs. Seems to be fairly progressive. Sees nephrologist at [facility] but no access to records. 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 An active GN at this time seems less likely and further w/u likely not necessary given advanced nature of his disease  Cause of AKI unclear. Likely has some volume overload given HTN but lungs are clear and he says his LEE is at baseline. Will continue w/u at this time.  -Obtain urine Na; if low would presume cardiorenal syndrome -Can continue diuretics as detailed below -Obtain RUS -Continue to monitor daily Cr, Dose meds for GFR -Monitor Daily I/Os, Daily weight  -Maintain MAP>65 for optimal renal perfusion.  -Avoid nephrotoxic medications including NSAIDs -Use synthetic opioids (Fentanyl/Dilaudid) if needed -Patient does not have good insight into his disease. We discussed his BL CKD is fairly advanced and AKI is even more severe. No uremic symptoms but we discussed that HD may be necessary  SOB/CP/CHF: Presenting symptoms and states that they have essentially resolved at this time.  His lungs are clear, chest x-ray without pulmonary edema, lower extremity edema is present but he states it is at baseline. EF normal in 2020.  -Unclear if CHF is severely exacerbated given no pulmonary edema and volume status not significantly different than baseline. Would be best to eval right heart. Agree with echo and cardiology involvement -Increase lasix to 120mg  and dose this evening -Continue dosing lasix BID unless clinical status changes  HTN:  restarting home BP meds but will do it slowly as not to drop BP too quickly -Restart amlodipine 10mg  daily and coreg 25mg  BID -Hold clonidine and restart if needed  Anemia: likely CKD contributing. FOBT positive -Obtain iron studies -Consider ESA -transfuse for Hgb < 7 -Defer antiplatelet and anticouaglation decisions to priamry  CAD/PAD: fairly extensive history: Mgmt per primary  Metabolic acidosis: Bicarb slightly low at 18.  Hopefully will improve with contraction.  Continue to monitor  Hypocalcemia: Corrects near normal when accounting for albumin.   Recommendations conveyed to primary service.    [facility] 02/28/2022 11:13 AM   _____________________________________________________________________________________ CC: SOB/CP  History of Present Illness: Matthew [name] is a/an 76 y.o. male with a past medical history of HTN, HLD, CAD, MI, s/p CABG, s/p AVR, CKD IV, PAD who present w/ SOB/CP  Patient states he was feeling his normal self yesterday with no significant shortness of breath or chest pain.  He stayed up late watching a football game and then later in the evening states that he had sudden chest tightness as well as shortness of breath.  He states he does not remember having the symptoms before although it is noted he has had a myocardial infarction in the past.  He denies any recent, fevers, chills, nausea, vomiting, diarrhea, cough.  He has chronic lower extremity edema and feels like his edema is at baseline.  He denies any dysuria, hematuria, difficulty voiding.  He does not know much about his kidney disease.  He states that he follows with a kidney doctor at the [place].  He  does not remember discussing the severity of his kidney disease or whether he would need dialysis in the future.  In the emergency department he had a chest x-ray without significant pulmonary edema.  He was noted to be hypertensive and hypoxic.  He was placed on  oxygen and given 60 mg of IV Lasix.  His home blood pressure medications have been reordered but not given.  Creatinine was found to be 5.9 with a baseline of 3.3-3.5.  His BNP was elevated. Trops negative   Medications:  Current Facility-Administered Medications  Medication Dose Route Frequency Provider Last Rate Last Admin   0.9 %  sodium chloride infusion  250 mL Intravenous PRN Barton Dubois, MD       acetaminophen (TYLENOL) tablet 650 mg  650 mg Oral Q6H PRN Zierle-Ghosh, Asia B, DO       Or   acetaminophen (TYLENOL) suppository 650 mg  650 mg Rectal Q6H PRN Zierle-Ghosh, Asia B, DO       albuterol (PROVENTIL) (2.5 MG/3ML) 0.083% nebulizer solution 2.5 mg  2.5 mg Nebulization Q2H PRN Zierle-Ghosh, Asia B, DO       amLODipine (NORVASC) tablet 10 mg  10 mg Oral q morning Zierle-Ghosh, Asia B, DO       aspirin EC tablet 81 mg  81 mg Oral Daily Zierle-Ghosh, Asia B, DO       atorvastatin (LIPITOR) tablet 80 mg  80 mg Oral q1800 Zierle-Ghosh, Asia B, DO       carvedilol (COREG) tablet 25 mg  25 mg Oral BID Zierle-Ghosh, Asia B, DO       cloNIDine (CATAPRES) tablet 0.1 mg  0.1 mg Oral BID Zierle-Ghosh, Asia B, DO       famotidine (PEPCID) tablet 20 mg  20 mg Oral Daily Barton Dubois, MD       febuxostat (ULORIC) tablet 40 mg  40 mg Oral Daily Barton Dubois, MD       heparin injection 5,000 Units  5,000 Units Subcutaneous Q8H Zierle-Ghosh, Asia B, DO       ondansetron (ZOFRAN) tablet 4 mg  4 mg Oral Q6H PRN Zierle-Ghosh, Asia B, DO       Or   ondansetron (ZOFRAN) injection 4 mg  4 mg Intravenous Q6H PRN Zierle-Ghosh, Asia B, DO       oxybutynin (DITROPAN) tablet 5 mg  5 mg Oral Q8H PRN Zierle-Ghosh, Asia B, DO       oxyCODONE (Oxy IR/ROXICODONE) immediate release tablet 5 mg  5 mg Oral Q4H PRN Zierle-Ghosh, Asia B, DO       sodium chloride flush (NS) 0.9 % injection 3 mL  3 mL Intravenous Q12H Barton Dubois, MD       sodium chloride flush (NS) 0.9 % injection 3 mL  3 mL Intravenous PRN  Barton Dubois, MD       Current Outpatient Medications  Medication Sig Dispense Refill   acetaminophen (TYLENOL) 325 MG tablet Take 2 tablets (650 mg total) by mouth every 6 (six) hours as needed for mild pain.     aspirin EC 81 MG tablet Take 81 mg by mouth daily.     atorvastatin (LIPITOR) 80 MG tablet Take 1 tablet (80 mg total) by mouth daily at 6 PM. 30 tablet 1   cholecalciferol (VITAMIN D3) 25 MCG (1000 UNIT) tablet Take 1,000 Units by mouth daily.     famotidine (PEPCID) 20 MG tablet Take 20 mg by mouth daily.     febuxostat (ULORIC) 40  MG tablet Take 40 mg by mouth daily.     ferrous sulfate 325 (65 FE) MG tablet Take 325 mg by mouth every Monday, Wednesday, and Friday.      gabapentin (NEURONTIN) 600 MG tablet Take 300 mg by mouth daily.     methocarbamol (ROBAXIN) 750 MG tablet Take 750 mg by mouth at bedtime as needed for muscle spasms.     metoprolol tartrate (LOPRESSOR) 100 MG tablet Take 100 mg by mouth 2 (two) times daily.     NIFEdipine (ADALAT CC) 60 MG 24 hr tablet Take 60 mg by mouth daily.     nitroGLYCERIN (NITROSTAT) 0.4 MG SL tablet Place 1 tablet (0.4 mg total) under the tongue every 5 (five) minutes x 3 doses as needed for chest pain. 30 tablet 0   Omega-3 Fatty Acids (OMEGA-3 FISH OIL) 300 MG CAPS Take 300 mg by mouth 2 (two) times daily.     oxyCODONE (OXY IR/ROXICODONE) 5 MG immediate release tablet Take 1 tablet (5 mg total) by mouth every 6 (six) hours as needed for severe pain. 10 tablet 0   amLODipine (NORVASC) 10 MG tablet Take 10 mg by mouth every morning.  (Patient not taking: Reported on 02/28/2022)     carvedilol (COREG) 25 MG tablet Take 1 tablet (25 mg total) by mouth 2 (two) times daily. 180 tablet 1   cloNIDine (CATAPRES) 0.1 MG tablet Take 1 tablet (0.1 mg total) by mouth 2 (two) times daily. (Patient not taking: Reported on 02/28/2022) 60 tablet 11   furosemide (LASIX) 20 MG tablet Take 20 mg for next three days, then take only as needed for swelling.  (Patient not taking: Reported on 02/28/2022) 90 tablet 3   oxybutynin (DITROPAN) 5 MG tablet Take 1 tablet (5 mg total) by mouth every 8 (eight) hours as needed for bladder spasms. (Patient not taking: Reported on 02/28/2022) 30 tablet 1     ALLERGIES Patient has no known allergies.  MEDICAL HISTORY Past Medical History:  Diagnosis Date   ACS (acute coronary syndrome) ([place]) 10/2018   Arthritis    Bladder stones    BPH (benign prostatic hyperplasia)    CAD (coronary artery disease)    a. Pt reports prior stenting @ [facility] - no records in Epic.   Chronic kidney disease (CKD), stage III (moderate) (H[facility])    dx from [place] in [place], [place]   Hyperlipidemia    Hypertension    Moderate to severe aortic stenosis    a. PER 01-15-16 ECHO [facility] [place].   Neuropathy    FEET AND HANDS   Peripheral vascular disease ([place])    a. 04/2016 s/p L Fem->PT bypass; b. 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 04/2017 LLE Duplex: No restenosis. ABI R 0.63, ABI L 1.01.   Post traumatic stress disorder (PTSD)    Pre-diabetes    Sleep apnea    does not use cpap     SOCIAL HISTORY Social History   Socioeconomic History   Marital status: Married    Spouse name: Not on file   Number of children: Not on file   Years of education: Not on file   Highest education level: Not on file  Occupational History   Not on file  Tobacco Use   Smoking status: Every Day    Packs/day: 0.50    Years: 50.00    Total pack years: 25.00    Types: Cigarettes    Start date: 06/15/1962   Smokeless tobacco: Never  Vaping Use   Vaping Use: Never used  Substance and Sexual Activity   Alcohol use: Never   Drug use: Never   Sexual activity: Not Currently  Other Topics Concern   Not on file  Social History Narrative   Lives in [place] with wife.  Retired Administrator.  Walks regularly.   Social Determinants of Health   Financial Resource Strain: Not on file  Food Insecurity: Not on file  Transportation Needs: Not  on file  Physical Activity: Not on file  Stress: Not on file  Social Connections: Not on file  Intimate Partner Violence: Not on file     FAMILY HISTORY Family History  Problem Relation Age of Onset   Leukemia Sister    Lung cancer Brother    Heart failure Mother        died @ 61   Diabetes Father        died @ 75   Heart attack Father       Review of Systems: 12 systems reviewed Otherwise as per HPI, all other systems reviewed and negative  Physical Exam: Vitals:   02/28/22 0827 02/28/22 1030  BP: (!) 188/78 (!) 184/77  Pulse: 70 61  Resp: 16   Temp: 97.9 F (36.6 C)   SpO2: 100% 100%   No intake/output data recorded.  Intake/Output Summary (Last 24 hours) at 02/28/2022 1113 Last data filed at 02/28/2022 0640 Gross per 24 hour  Intake --  Output 600 ml  Net -600 ml   General: well-appearing, no acute distress HEENT: anicteric sclera, oropharynx clear without lesions CV: normal rate, no audible murmur, 1+ pitting edema up to the mid shin bilaterally Lungs: clear to auscultation bilaterally, normal work of breathing Abd: soft, non-tender, non-distended Skin: Mild chronic venous stasis changes, otherwise no visible lesions or rashes Psych: alert, engaged, appropriate mood and affect Musculoskeletal: no obvious deformities Neuro: normal speech, no gross focal deficits   Test Results Reviewed Lab Results  Component Value Date   NA 141 02/28/2022   K 4.6 02/28/2022   CL 114 (H) 02/28/2022   CO2 18 (L) 02/28/2022   BUN 52 (H) 02/28/2022   CREATININE 5.89 (H) 02/28/2022   CALCIUM 8.0 (L) 02/28/2022   ALBUMIN 3.3 (L) 02/28/2022    CBC Recent Labs  Lab 02/28/22 0032  WBC 8.7  NEUTROABS 6.2  HGB 8.4*  HCT 26.3*  MCV 96.0  PLT 198    I have reviewed all relevant outside healthcare records related to the patient's current hospitalization

## 2022-02-28 NOTE — ED Triage Notes (Signed)
Fire Department stated he was 80% On RA and applied 2L O2.  EMS stated he was 90% on RA and clear. Lungs sounds.  He appears to have auditory wheezes upon arrival.
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
## 2022-02-28 NOTE — ED Notes (Addendum)
Hemoccult BLOOD = Positive - Dr Betsey Holiday aware

## 2022-03-01 DIAGNOSIS — R0602 Shortness of breath: Secondary | ICD-10-CM | POA: Diagnosis not present

## 2022-03-01 DIAGNOSIS — J9601 Acute respiratory failure with hypoxia: Secondary | ICD-10-CM | POA: Diagnosis not present

## 2022-03-01 DIAGNOSIS — N179 Acute kidney failure, unspecified: Secondary | ICD-10-CM | POA: Diagnosis not present

## 2022-03-01 DIAGNOSIS — D631 Anemia in chronic kidney disease: Secondary | ICD-10-CM

## 2022-03-01 DIAGNOSIS — L8942 Pressure ulcer of contiguous site of back, buttock and hip, stage 2: Secondary | ICD-10-CM | POA: Diagnosis not present

## 2022-03-01 LAB — CBC WITH DIFFERENTIAL/PLATELET
Abs Immature Granulocytes: 0.02 10*3/uL (ref 0.00–0.07)
Basophils Absolute: 0 10*3/uL (ref 0.0–0.1)
Basophils Relative: 1 %
Eosinophils Absolute: 0.3 10*3/uL (ref 0.0–0.5)
Eosinophils Relative: 7 %
HCT: 24.3 % — ABNORMAL LOW (ref 39.0–52.0)
Hemoglobin: 7.8 g/dL — ABNORMAL LOW (ref 13.0–17.0)
Immature Granulocytes: 0 %
Lymphocytes Relative: 24 %
Lymphs Abs: 1.2 10*3/uL (ref 0.7–4.0)
MCH: 30.8 pg (ref 26.0–34.0)
MCHC: 32.1 g/dL (ref 30.0–36.0)
MCV: 96 fL (ref 80.0–100.0)
Monocytes Absolute: 0.7 10*3/uL (ref 0.1–1.0)
Monocytes Relative: 14 %
Neutro Abs: 2.6 10*3/uL (ref 1.7–7.7)
Neutrophils Relative %: 54 %
Platelets: 183 10*3/uL (ref 150–400)
RBC: 2.53 MIL/uL — ABNORMAL LOW (ref 4.22–5.81)
RDW: 13.6 % (ref 11.5–15.5)
WBC: 4.8 10*3/uL (ref 4.0–10.5)
nRBC: 0 % (ref 0.0–0.2)

## 2022-03-01 LAB — BASIC METABOLIC PANEL
Anion gap: 8 (ref 5–15)
BUN: 52 mg/dL — ABNORMAL HIGH (ref 8–23)
CO2: 20 mmol/L — ABNORMAL LOW (ref 22–32)
Calcium: 8.3 mg/dL — ABNORMAL LOW (ref 8.9–10.3)
Chloride: 113 mmol/L — ABNORMAL HIGH (ref 98–111)
Creatinine, Ser: 5.13 mg/dL — ABNORMAL HIGH (ref 0.61–1.24)
GFR, Estimated: 11 mL/min — ABNORMAL LOW (ref >60)
Glucose, Bld: 81 mg/dL (ref 70–99)
Potassium: 4.5 mmol/L (ref 3.5–5.1)
Sodium: 141 mmol/L (ref 135–145)

## 2022-03-01 LAB — URINE CULTURE: Culture: NO GROWTH

## 2022-03-01 LAB — MAGNESIUM: Magnesium: 1.9 mg/dL (ref 1.7–2.4)

## 2022-03-01 LAB — TSH: TSH: 2.299 u[IU]/mL (ref 0.350–4.500)

## 2022-03-01 NOTE — Progress Notes (Signed)
Progress Note   [name] [id number]:[id number] DOB: May 27, 1945 DOA: 02/28/2022     1 DOS: the patient was seen and examined on 03/01/2022   Brief hospital course: Matthew [name] is a 76 y.o. male with medical history significant of hypertension, hyperlipidemia, history of coronary artery disease status post CABG, status post AVR, chronic kidney disease stage IV, peripheral arterial disease and GERD; who presented to the emergency department secondary to shortness of breath, chest pressure and dyspnea exertion. Patient denies any focal deficits, coughing spells, hematuria, dysuria, melena, hematochezia, fever/chills or any other complaints.   Work-up demonstrating elevated BNP, hypoxia (new to patient) and significant worsening in his renal function.  TRH has been contacted to place patient in the hospital for further evaluation and management.  Nephrology and cardiology service consulted.  Assessment and Plan: 1-shortness of breath/acute hypoxic respiratory failure in the setting of acute on chronic diastolic heart failure. -Breathing improving and currently good saturation on room air appreciated -Still slightly short winded with activity and demonstrating decreased breath sounds at the bases -Continue IV diuresis -Continue low-sodium diet, daily weight and strict I's and O's -Patient creatinine once diuresis has been initiated has trended down some (creatinine 5.13) Continue close monitoring of his electrolytes and renal function -Continue to follow clinical response and REDs reading.  2-acute kidney injury on chronic renal failure stage IV -Continue to follow nephrology service recommendation -Continue strict I's and O's and daily weight -No signs of UTI -Renal ultrasound demonstrating chronic medical renal disease without hydronephrosis or obstruction. -After IV diuresis provided creatinine has trended down swollen -No acute indication for dialysis  currently.  3-hypertension -Stable -Continue amlodipine and Coreg -Also receiving IV diuresis.  4-anemia of chronic kidney disease -Given history of coronary disease plan is for transfusion if hemoglobin less than 7.5 -Fecal occult blood test at time of admission was positive; stopping heparin products and using SCDs for DVT prophylaxis -Continue monitoring hemoglobin trend -IV iron and Epogen as per nephrology discretion.  5-history of coronary artery disease -Continue aspirin and statin -Patient denies chest pain. -2D echo reassuring without wall motion abnormalities and no EKG changes for ischemia.  6-hyperlipidemia -Continue statin  7-history of gout -No acute flare currently -Continue Uloric  8-GERD -Continue Pepcid  9-stage II pressure injury -Localizing his coccyx; no signs of superimposed infection -Present at time of admission -Continue constant repositioning and preventive measures.  Subjective:  Afebrile, no chest pain, no nausea, no vomiting, no palpitations.  Reports breathing is improving and expressed good urine output.  Physical Exam: Vitals:   03/01/22 0500 03/01/22 0943 03/01/22 1100 03/01/22 1323  BP:  (!) 184/72 (!) 157/76 (!) 150/77  Pulse:  63 75 65  Resp:  18 17   Temp:  98.5 F (36.9 C)  98.3 F (36.8 C)  TempSrc:  Oral    SpO2:  98% 98% 96%  Weight: 78.4 kg     Height:       General exam: Alert, awake, oriented x 3; reporting feeling better and breathing easier.  Good urine output.  No chest pain, no nausea, no vomiting.  In no acute distress. Respiratory system: Decreased breath sounds at the bases; no wheezing, no using accessory muscles.  Good saturation on room air. Cardiovascular system:RRR. No rubs or gallops; no JVD. Gastrointestinal system: Abdomen is nondistended, soft and nontender. No organomegaly or masses felt. Normal bowel sounds heard. Central nervous system: Alert and oriented. No focal neurological deficits. Extremities:  No cyanosis or clubbing  appreciated on exam. Skin: No petechiae.  Stage II coccyx pressure injury without signs of superimposed infection present at time of admission. Psychiatry: Judgement and insight appear normal. Mood & affect appropriate.   Data Reviewed: Magnesium: 1.9 CBC: Hemoglobin 7.8, WBCs 4.8 and platelet count 737 K Basic metabolic panel: Sodium 106, potassium 4.5, bicarb 20, chloride 113, BUN 52, creatinine 5.13 and GFR 11. TSH: 2.299  Family Communication: No family at bedside.  Disposition: Status is: Inpatient Remains inpatient appropriate because: Treatment for acute on chronic renal failure and CHF exacerbation.   Planned Discharge Destination: Home  Time spent: 35 minutes  Author: Barton Dubois, MD 03/01/2022 4:45 PM  For on call review [email or web address].

## 2022-03-02 DIAGNOSIS — J9601 Acute respiratory failure with hypoxia: Secondary | ICD-10-CM | POA: Diagnosis not present

## 2022-03-02 DIAGNOSIS — N179 Acute kidney failure, unspecified: Secondary | ICD-10-CM | POA: Diagnosis not present

## 2022-03-02 DIAGNOSIS — L8942 Pressure ulcer of contiguous site of back, buttock and hip, stage 2: Secondary | ICD-10-CM | POA: Diagnosis not present

## 2022-03-02 DIAGNOSIS — R0602 Shortness of breath: Secondary | ICD-10-CM | POA: Diagnosis not present

## 2022-03-02 LAB — CBC
HCT: 29.2 % — ABNORMAL LOW (ref 39.0–52.0)
Hemoglobin: 9.3 g/dL — ABNORMAL LOW (ref 13.0–17.0)
MCH: 30.2 pg (ref 26.0–34.0)
MCHC: 31.8 g/dL (ref 30.0–36.0)
MCV: 94.8 fL (ref 80.0–100.0)
Platelets: 215 10*3/uL (ref 150–400)
RBC: 3.08 MIL/uL — ABNORMAL LOW (ref 4.22–5.81)
RDW: 13.3 % (ref 11.5–15.5)
WBC: 8 10*3/uL (ref 4.0–10.5)
nRBC: 0 % (ref 0.0–0.2)

## 2022-03-02 LAB — RENAL FUNCTION PANEL
Albumin: 3.4 g/dL — ABNORMAL LOW (ref 3.5–5.0)
Anion gap: 13 (ref 5–15)
BUN: 52 mg/dL — ABNORMAL HIGH (ref 8–23)
CO2: 18 mmol/L — ABNORMAL LOW (ref 22–32)
Calcium: 8.8 mg/dL — ABNORMAL LOW (ref 8.9–10.3)
Chloride: 108 mmol/L (ref 98–111)
Creatinine, Ser: 5.18 mg/dL — ABNORMAL HIGH (ref 0.61–1.24)
GFR, Estimated: 11 mL/min — ABNORMAL LOW (ref >60)
Glucose, Bld: 93 mg/dL (ref 70–99)
Phosphorus: 5.4 mg/dL — ABNORMAL HIGH (ref 2.5–4.6)
Potassium: 4.7 mmol/L (ref 3.5–5.1)
Sodium: 139 mmol/L (ref 135–145)

## 2022-03-02 MED ORDER — HYDRALAZINE HCL 20 MG/ML IJ SOLN
10.0000 mg | Freq: Four times a day (QID) | INTRAMUSCULAR | Status: DC | PRN
  Administered 2022-03-02: 10 mg via INTRAVENOUS
  Filled 2022-03-02: qty 1

## 2022-03-02 MED ORDER — LIVING BETTER WITH HEART FAILURE BOOK: Freq: Once | Status: AC

## 2022-03-02 MED ORDER — OXYCODONE HCL 5 MG PO TABS
5.0000 mg | ORAL_TABLET | Freq: Four times a day (QID) | ORAL | Status: DC | PRN
  Administered 2022-03-02 – 2022-03-11 (×28): 5 mg via ORAL
  Filled 2022-03-02 (×28): qty 1

## 2022-03-02 NOTE — Progress Notes (Signed)
patients bp is 185/92 and hr 85. scheduled amlodipine given. the night shift nurse said his bp was high this morning and night shift doc told her to give the morning dose of coreg so that was already given. MD [facility] notified.

## 2022-03-02 NOTE — TOC Progression Note (Signed)
  Transition of Care [facility]) Screening Note   Patient Details  Name: Matthew [name] Date of Birth: [birth year]   [facility]) CM/SW Contact:    Boneta Lucks, RN Phone Number: 03/02/2022, 5:07 PM  [facility] notification completed -[phone]      Transition of Care Department (TOC) has reviewed patient and no TOC needs have been identified at this time. We will continue to monitor patient advancement through interdisciplinary progression rounds. If new patient transition needs arise, please place a TOC consult.     Barriers to Discharge: Continued Medical Work up

## 2022-03-02 NOTE — Progress Notes (Signed)
Progress Note   [name] TDS:[id number] DOB: 1945/05/11 DOA: 02/28/2022     2 DOS: the patient was seen and examined on 03/02/2022   Brief hospital course: Matthew [name] is a 76 y.o. male with medical history significant of hypertension, hyperlipidemia, history of coronary artery disease status post CABG, status post AVR, chronic kidney disease stage IV, peripheral arterial disease and GERD; who presented to the emergency department secondary to shortness of breath, chest pressure and dyspnea exertion. Patient denies any focal deficits, coughing spells, hematuria, dysuria, melena, hematochezia, fever/chills or any other complaints.   Work-up demonstrating elevated BNP, hypoxia (new to patient) and significant worsening in his renal function.  TRH has been contacted to place patient in the hospital for further evaluation and management.  Nephrology and cardiology service consulted.  Assessment and Plan: 1-shortness of breath/acute hypoxic respiratory failure in the setting of acute on chronic diastolic heart failure. -Breathing improving and currently good saturation on room air appreciated -Still slightly short winded with activity and demonstrating decreased breath sounds at the bases -Continue IV diuresis -Continue low-sodium diet, daily weight and strict I's and O's -Patient creatinine once diuresis has been initiated has trended down some (creatinine 5.18) Continue close monitoring of his electrolytes and renal function -Continue to follow clinical response and REDs reading.  2-acute kidney injury on chronic renal failure stage IV -Continue to follow nephrology service recommendation -Continue strict I's and O's and daily weight -No signs of UTI -Renal ultrasound demonstrating chronic medical renal disease without hydronephrosis or obstruction. -After IV diuresis provided creatinine has trended down some; no swelling appreciated. -No acute indication for dialysis  currently.  3-hypertension -Stable -Continue amlodipine and Coreg -Also receiving IV diuresis. -As needed hydralazine has been ordered for systolic blood pressure above 262 and/or diastolic blood pressure more than 110.  4-anemia of chronic kidney disease -Given history of coronary disease plan is for transfusion if hemoglobin less than 7.5 -Fecal occult blood test at time of admission was positive; stopping heparin products and using SCDs for DVT prophylaxis -Continue monitoring hemoglobin trend -IV iron and Epogen as per nephrology discretion. -Current hemoglobin level 9.3  5-history of coronary artery disease -Continue aspirin and statin -Patient denies chest pain. -2D echo reassuring without wall motion abnormalities and no EKG changes for ischemia.  6-hyperlipidemia -Continue statin -Heart healthy diet discussed with patient.  7-history of gout -No acute flare currently -Continue Uloric  8-GERD -Continue Pepcid  9-stage II pressure injury -Localizing his coccyx; no signs of superimposed infection -Present at time of admission -Continue constant repositioning and preventive measures.  Subjective:  No fever, no chest pain, no nausea vomiting.  Patient reports no overt bleeding.  Good urine output and a stable breathing.  1 L nasal cannula supplementation applied overnight.   Physical Exam: Vitals:   03/02/22 0611 03/02/22 0813 03/02/22 0851 03/02/22 1325  BP:  (!) 185/92 (!) 185/85 (!) 159/79  Pulse:   87 97  Resp:   18 16  Temp:    100.1 F (37.8 C)  TempSrc:      SpO2:   96% 100%  Weight: 80.2 kg     Height:       General exam: Alert, awake, oriented x 3; reporting good urine output, no chest pain, no nausea, no vomiting and not palpitations.  1 L nasal cannula supplementation in place overnight. Respiratory system: Decreased breath sounds at the bases; no using accessory muscle.  No wheezing on exam. Cardiovascular system:RRR. No rubs  or gallops appreciated  on examination. Gastrointestinal system: Abdomen is nondistended, soft and nontender. No organomegaly or masses felt. Normal bowel sounds heard. Central nervous system: Alert and oriented. No focal neurological deficits. Extremities: No cyanosis or edema. Skin: No petechiae; unchanged stage II coccyx pressure injury present at time of admission without superimposed infection. Psychiatry: Judgement and insight appear normal. Mood & affect appropriate.   Data Reviewed: Magnesium: 1.9 CBC: WBCs 8.0, hemoglobin 9.3, platelet count 215 K Renal function panel: Sodium 139, potassium 4.7, chloride 108, bicarb 18, BUN 52, creatinine 5.80 and GFR 11.  Calcium 8.8 TSH: 2.299  Family Communication: No family at bedside.  Disposition: Status is: Inpatient Remains inpatient appropriate because: Treatment for acute on chronic renal failure and CHF exacerbation.   Planned Discharge Destination: Home  Time spent: 35 minutes  Author: Barton Dubois, MD 03/02/2022 4:38 PM  For on call review [email or web address].

## 2022-03-03 DIAGNOSIS — J9601 Acute respiratory failure with hypoxia: Secondary | ICD-10-CM | POA: Diagnosis not present

## 2022-03-03 DIAGNOSIS — L8942 Pressure ulcer of contiguous site of back, buttock and hip, stage 2: Secondary | ICD-10-CM | POA: Diagnosis not present

## 2022-03-03 DIAGNOSIS — R0602 Shortness of breath: Secondary | ICD-10-CM | POA: Diagnosis not present

## 2022-03-03 DIAGNOSIS — N179 Acute kidney failure, unspecified: Secondary | ICD-10-CM | POA: Diagnosis not present

## 2022-03-03 LAB — RENAL FUNCTION PANEL
Albumin: 3.3 g/dL — ABNORMAL LOW (ref 3.5–5.0)
Anion gap: 14 (ref 5–15)
BUN: 60 mg/dL — ABNORMAL HIGH (ref 8–23)
CO2: 20 mmol/L — ABNORMAL LOW (ref 22–32)
Calcium: 8.6 mg/dL — ABNORMAL LOW (ref 8.9–10.3)
Chloride: 105 mmol/L (ref 98–111)
Creatinine, Ser: 6.07 mg/dL — ABNORMAL HIGH (ref 0.61–1.24)
GFR, Estimated: 9 mL/min — ABNORMAL LOW (ref >60)
Glucose, Bld: 128 mg/dL — ABNORMAL HIGH (ref 70–99)
Phosphorus: 6.6 mg/dL — ABNORMAL HIGH (ref 2.5–4.6)
Potassium: 4 mmol/L (ref 3.5–5.1)
Sodium: 139 mmol/L (ref 135–145)

## 2022-03-03 MED ORDER — FUROSEMIDE 40 MG PO TABS
40.0000 mg | ORAL_TABLET | Freq: Two times a day (BID) | ORAL | Status: DC
  Administered 2022-03-04 – 2022-03-11 (×14): 40 mg via ORAL
  Filled 2022-03-03 (×14): qty 1

## 2022-03-03 MED ORDER — DARBEPOETIN ALFA 100 MCG/0.5ML IJ SOSY
100.0000 ug | PREFILLED_SYRINGE | INTRAMUSCULAR | Status: DC
  Administered 2022-03-03 – 2022-03-10 (×2): 100 ug via SUBCUTANEOUS
  Filled 2022-03-03 (×3): qty 0.5

## 2022-03-03 NOTE — TOC Initial Note (Signed)
Transition of Care [facility]) - Initial/Assessment Note    Patient Details  Name: Matthew [name] MRN: [id number] Date of Birth: Mar 24, 1946  Transition of Care [facility]) CM/SW Contact:    Iona Beard, [place] Phone Number: 03/03/2022, 12:39 PM  Clinical Narrative:                 [facility] consulted for CHF screen. CSW spoke with pt in room to complete assessment. Pt lives alone and states he is independent in completing his ADLs. Pt states that he has family that checks in on him as well. Pt states he is able to drive as needed. Pt states he has not had HH. Pt has a cane to use when needed.   CSW spoke with pt to complete CHF screen. Pt states he weighs about 2 times a month. CSW educated pt on importance of daily weights. Pt states he takes medications as prescribed. Pt states he follows a heart healthy diet. TOC to follow.   Expected Discharge Plan: [place] Barriers to Discharge: Continued Medical Work up   Patient Goals and CMS Choice Patient states their goals for this hospitalization and ongoing recovery are:: get better CMS Medicare.gov Compare Post Acute Care list provided to:: Patient Choice offered to / list presented to : Patient  Expected Discharge Plan and Services Expected Discharge Plan: [place] In-house Referral: Clinical Social Work Discharge Planning Services: CM Consult   Living arrangements for the past 2 months: [place]                                      Prior Living Arrangements/Services Living arrangements for the past 2 months: Single Family Home Lives with:: Self Patient language and need for interpreter reviewed:: Yes Do you feel safe going back to the place where you live?: Yes      Need for Family Participation in Patient Care: Yes (Comment) Care giver support system in place?: Yes (comment) Current home services: DME Kasandra Knudsen) Criminal Activity/Legal Involvement Pertinent to Current  Situation/Hospitalization: No - Comment as needed  Activities of Daily Living Home Assistive Devices/Equipment: Cane (specify quad or straight), Walker (specify type) ADL Screening (condition at time of admission) Patient's cognitive ability adequate to safely complete daily activities?: Yes Is the patient deaf or have difficulty hearing?: No Does the patient have difficulty seeing, even when wearing glasses/contacts?: No Does the patient have difficulty concentrating, remembering, or making decisions?: No Patient able to express need for assistance with ADLs?: Yes Does the patient have difficulty dressing or bathing?: No Independently performs ADLs?: Yes (appropriate for developmental age) Does the patient have difficulty walking or climbing stairs?: Yes Weakness of Legs: Both Weakness of Arms/Hands: Left  Permission Sought/Granted                  Emotional Assessment Appearance:: Appears stated age Attitude/Demeanor/Rapport: Engaged Affect (typically observed): Accepting Orientation: : Oriented to Self, Oriented to Place, Oriented to  Time, Oriented to Situation Alcohol / Substance Use: Not Applicable Psych Involvement: No (comment)  Admission diagnosis:  SOB (shortness of breath) [R06.02] CHF exacerbation (HCC) [I50.9] Proteinuria, unspecified type [R80.9] Patient Active Problem List   Diagnosis Date Noted   CHF exacerbation ([place]) 02/28/2022   Pressure injury of skin 02/28/2022   CKD (chronic kidney disease), stage IV ([place]) 10/06/2018   Left upper extremity swelling 10/06/2018   Carotid  artery stenosis 05/12/2018   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   Chest pain not due to acute coronary syndrome    Non-ST elevation (NSTEMI) myocardial infarction [facility])    PAD (peripheral artery disease) (HCC)    Severe aortic stenosis    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   Great toe amputation status, left 04/22/2016   Atherosclerosis of native arteries of left leg  with ulceration of other part of lower left leg 04/07/2016   Osteomyelitis of toe of left foot ([place]) 11/16/2015   HTN (hypertension) 11/16/2015   CKD (chronic kidney disease), stage III ([place]) 11/16/2015   Hyperlipidemia 11/16/2015   PCP:  Charlsie Merles, MD Pharmacy:   [facility] [address], [place] [address] Phone: [phone] Fax: [phone]     Social Determinants of Health (SDOH) Interventions    Readmission Risk Interventions    03/03/2022   12:35 PM  Readmission Risk Prevention Plan  Transportation Screening Complete  Home Care Screening Complete  Medication Review (RN CM) Complete

## 2022-03-03 NOTE — Progress Notes (Signed)
Subjective:  renal function has not improved over the weekend, if anything it is slightly worse-  is 6 liters negative-  BP still a little high and still on [place] o2-  renal u/s c/sw medical renal disease.  He is having trouble with orientation questions today -  SOB persists-  says foot pain   Objective Vital signs in last 24 hours: Vitals:   03/02/22 2051 03/03/22 0306 03/03/22 0314 03/03/22 0830  BP: (!) 187/89 (!) 171/90  (!) 163/92  Pulse: 99 91  90  Resp: 17 15  16   Temp: 99.5 F (37.5 C) 98.9 F (37.2 C)  98.8 F (37.1 C)  TempSrc:    Oral  SpO2: 100% 100%  97%  Weight:   81.8 kg   Height:       Weight change: 1.6 kg  Intake/Output Summary (Last 24 hours) at 03/03/2022 0843 Last data filed at 03/03/2022 0006 Gross per 24 hour  Intake 873.82 ml  Output 1675 ml  Net -801.18 ml    Assessment/Recommendations: 76 y.o. male with  HTN, HLD, CAD, MI, s/p CABG, s/p AVR, CKD IV, PAD who present w/ SOB/CP and AKI    Non-Oliguric AKI on CKD IV: BL Crt ~3.3-3.5 as of May but no recent labs. Seems to be progressive. Sees nephrologist at [facility] but no access to records. 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  Renal function if anything has worsened -  no acute indications for HD but appears to be failing to thrive so possibly chronic indications.   I mentioned dialysis -he immediately said " I dont want dialysis"  I asked if he knew what that meant and he said " I will die"   From what I see he would be poor candidate for HD-  I will discuss with his daughter -  may need to get palliative involved    Cause of AKI unclear. Likely has some volume overload given HTN but lungs are clear and he says his LEE is at baseline.   -Patient does not have good insight into his disease. We discussed his BL CKD is fairly advanced and AKI is even more severe. No uremic symptoms but we discussed that HD may be necessary as above    SOB/CP/CHF: Presenting  symptoms and states that they have essentially resolved at this time.  His lungs are clear, chest x-ray without pulmonary edema, lower extremity edema is present but he states it is at baseline. EF normal in 2020 as well as now.  - presuming diastolic dysfunction -  no action recommended by cardiology  I am going to stop the IV diuretics-  start some PO lasix tomorrow     HTN: restarting home BP meds but will do it slowly as not to drop BP too quickly -Restart amlodipine 10mg  daily and coreg 25mg  BID -Hold clonidine and restart if needed-- BP reasonable for now    Anemia: likely CKD contributing. FOBT positive -Obtain iron studies-- were low, will replete iron  -also will give ESA -transfuse for Hgb < 7    CAD/PAD: fairly extensive history: Mgmt per primary   Metabolic acidosis: Bicarb slightly low at 18.  Hopefully will improve with contraction.  is fine   Hypocalcemia: Corrects near normal when accounting for albumin.       Matthew [name]    Labs: Basic Metabolic Panel: Recent Labs  Lab 03/01/22 0536 03/02/22 0353 03/03/22 0531  NA 141 139 139  K 4.5 4.7 4.0  CL 113* 108 105  CO2 20* 18* 20*  GLUCOSE 81 93 128*  BUN 52* 52* 60*  CREATININE 5.13* 5.18* 6.07*  CALCIUM 8.3* 8.8* 8.6*  PHOS  --  5.4* 6.6*   Liver Function Tests: Recent Labs  Lab 02/28/22 0032 03/02/22 0353 03/03/22 0531  AST 15  --   --   ALT 15  --   --   ALKPHOS 61  --   --   BILITOT 0.4  --   --   PROT 6.9  --   --   ALBUMIN 3.3* 3.4* 3.3*   No results for input(s): "LIPASE", "AMYLASE" in the last 168 hours. No results for input(s): "AMMONIA" in the last 168 hours. CBC: Recent Labs  Lab 02/28/22 0032 03/01/22 0536 03/02/22 0353  WBC 8.7 4.8 8.0  NEUTROABS 6.2 2.6  --   HGB 8.4* 7.8* 9.3*  HCT 26.3* 24.3* 29.2*  MCV 96.0 96.0 94.8  PLT 198 183 215   Cardiac Enzymes: No results for input(s): "CKTOTAL", "CKMB", "CKMBINDEX", "TROPONINI" in the last 168 hours. CBG: No  results for input(s): "GLUCAP" in the last 168 hours.  Iron Studies:  Recent Labs    02/28/22 1104  IRON 31*  TIBC 317  FERRITIN 112   Studies/Results: No results found. Medications: Infusions:  sodium chloride 250 mL (02/28/22 2115)   ferric gluconate (FERRLECIT) IVPB Stopped (03/02/22 1208)   furosemide 120 mg (03/02/22 1656)    Scheduled Medications:  amLODipine  10 mg Oral q morning   aspirin EC  81 mg Oral Daily   atorvastatin  80 mg Oral q1800   carvedilol  25 mg Oral BID   famotidine  10 mg Oral Daily   febuxostat  40 mg Oral Daily   sodium chloride flush  3 mL Intravenous Q12H    have reviewed scheduled and prn medications.  Physical Exam: General: eyes closed, some trouble with orientation questions -  attempted to call his daughter and could not do Heart: RRR Lungs: mostly clear Abdomen: soft, non tender Extremities: min edema -  s/p bilat toe amputations     03/03/2022,8:43 AM  LOS: 3 days

## 2022-03-03 NOTE — Progress Notes (Signed)
Progress Note   [name]:[id number] DOB: 1945-08-03 DOA: 02/28/2022     3 DOS: the patient was seen and examined on 03/03/2022   Brief hospital course: Matthew [name] is a 76 y.o. male with medical history significant of hypertension, hyperlipidemia, history of coronary artery disease status post CABG, status post AVR, chronic kidney disease stage IV, peripheral arterial disease and GERD; who presented to the emergency department secondary to shortness of breath, chest pressure and dyspnea exertion. Patient denies any focal deficits, coughing spells, hematuria, dysuria, melena, hematochezia, fever/chills or any other complaints.   Work-up demonstrating elevated BNP, hypoxia (new to patient) and significant worsening in his renal function.  TRH has been contacted to place patient in the hospital for further evaluation and management.  Nephrology and cardiology service consulted.  Assessment and Plan: 1-shortness of breath/acute hypoxic respiratory failure in the setting of acute on chronic diastolic heart failure. -Breathing improving and currently good saturation on room air appreciated -Denies shortness of breath and at time of evaluation no oxygen required. -Patient has declined dialysis initiation even if needed despite understanding death sentence without the treatment. -Diuretics will be transitioned to oral route as per nephrology recommendations. -Continue low-sodium diet, daily weight and strict I's and O's -Patient creatinine once diuresis has been initiated has trended up some (creatinine 6.07) -Continue close monitoring of his electrolytes and renal function (worsening) -Continue to follow clinical response and REDs reading.  2-acute kidney injury on chronic renal failure stage IV -Continue to follow nephrology service recommendation -Continue strict I's and O's and daily weight -No signs of UTI -Renal ultrasound demonstrating chronic medical renal disease  without hydronephrosis or obstruction. -After IV diuresis provided creatinine has now trended up -Patient not interested on hemodialysis; following nephrology recommendations he will be transition to oral diuretics and will follow renal function trend in AM. -Palliative care consulted.  3-hypertension -Stable -Continue amlodipine and Coreg -Also receiving IV diuresis. -As needed hydralazine has been ordered for systolic blood pressure above 811 and/or diastolic blood pressure more than 110.  4-anemia of chronic kidney disease -Given history of coronary disease plan is for transfusion if hemoglobin less than 7.5 -Fecal occult blood test at time of admission was positive; stopping heparin products and using SCDs for DVT prophylaxis -Continue monitoring hemoglobin trend -IV iron and Epogen as per nephrology discretion. -Last hemoglobin level 9.3 -No signs of overt bleeding appreciated.  5-history of coronary artery disease -Continue aspirin and statin -Patient denies chest pain. -2D echo reassuring without wall motion abnormalities and no EKG changes for ischemia.  6-hyperlipidemia -Continue statin -Heart healthy diet discussed with patient.  7-history of gout -No acute flare currently -Continue Uloric  8-GERD -Continue Pepcid  9-stage II pressure injury -Localizing his coccyx; no signs of superimposed infection -Present at time of admission -Continue constant repositioning and preventive measures.  Subjective:  No fever, no chest pain, no nausea, no vomiting.  Patient has reiterated no interest on hemodialysis; even if that means at death sentence.  Feels that his breathing is back to baseline and would like to go home.  Physical Exam: Vitals:   03/03/22 0314 03/03/22 0830 03/03/22 1000 03/03/22 1447  BP:  (!) 163/92  (!) 151/7  Pulse:  90  82  Resp:  16  18  Temp:  98.8 F (37.1 C)  99.3 F (37.4 C)  TempSrc:  Oral  Oral  SpO2:  97%  97%  Weight: 81.8 kg  83 kg    Height:  General exam: Alert, awake, oriented x 3; at time of my evaluation no oxygen in place.  Reports breathing is back to his baseline and he has noticed increasing urine output.  No chest pain, no nausea, no vomiting. Respiratory system: No wheezing or crackles on exam. Cardiovascular system:RRR. No rubs or gallops. Gastrointestinal system: Abdomen is nondistended, soft and nontender. No organomegaly or masses felt. Normal bowel sounds heard. Central nervous system: Alert and oriented. No focal neurological deficits. Extremities: No cyanosis or clubbing; positive trace edema appreciated bilaterally. Skin: No petechiae. Psychiatry: Judgement and insight appear normal. Mood & affect appropriate.   Data Reviewed: Magnesium: 1.9 CBC: WBCs 8.0, hemoglobin 9.3, platelet count 215 K Renal function panel: Sodium 139, potassium 4.0, chloride 105, bicarb 20, BUN 60, creatinine 6.07 TSH: 2.299  Family Communication: No family at bedside.  Disposition: Status is: Inpatient Remains inpatient appropriate because: Treatment for acute on chronic renal failure and CHF exacerbation.   Planned Discharge Destination: Home  Time spent: 35 minutes  Author: Barton Dubois, MD 03/03/2022 5:15 PM  For on call review [email or web address].

## 2022-03-04 ENCOUNTER — Encounter (HOSPITAL_COMMUNITY): Payer: Self-pay | Admitting: Family Medicine

## 2022-03-04 DIAGNOSIS — I509 Heart failure, unspecified: Secondary | ICD-10-CM

## 2022-03-04 DIAGNOSIS — Z7189 Other specified counseling: Secondary | ICD-10-CM

## 2022-03-04 DIAGNOSIS — J9601 Acute respiratory failure with hypoxia: Secondary | ICD-10-CM | POA: Diagnosis not present

## 2022-03-04 DIAGNOSIS — R0602 Shortness of breath: Secondary | ICD-10-CM | POA: Diagnosis not present

## 2022-03-04 DIAGNOSIS — N179 Acute kidney failure, unspecified: Secondary | ICD-10-CM | POA: Diagnosis not present

## 2022-03-04 DIAGNOSIS — Z515 Encounter for palliative care: Secondary | ICD-10-CM | POA: Diagnosis not present

## 2022-03-04 DIAGNOSIS — R809 Proteinuria, unspecified: Secondary | ICD-10-CM | POA: Diagnosis not present

## 2022-03-04 DIAGNOSIS — L8942 Pressure ulcer of contiguous site of back, buttock and hip, stage 2: Secondary | ICD-10-CM | POA: Diagnosis not present

## 2022-03-04 LAB — RENAL FUNCTION PANEL
Albumin: 3 g/dL — ABNORMAL LOW (ref 3.5–5.0)
Anion gap: 14 (ref 5–15)
BUN: 75 mg/dL — ABNORMAL HIGH (ref 8–23)
CO2: 19 mmol/L — ABNORMAL LOW (ref 22–32)
Calcium: 8.7 mg/dL — ABNORMAL LOW (ref 8.9–10.3)
Chloride: 103 mmol/L (ref 98–111)
Creatinine, Ser: 6.5 mg/dL — ABNORMAL HIGH (ref 0.61–1.24)
GFR, Estimated: 8 mL/min — ABNORMAL LOW (ref >60)
Glucose, Bld: 110 mg/dL — ABNORMAL HIGH (ref 70–99)
Phosphorus: 6.9 mg/dL — ABNORMAL HIGH (ref 2.5–4.6)
Potassium: 4.2 mmol/L (ref 3.5–5.1)
Sodium: 136 mmol/L (ref 135–145)

## 2022-03-04 MED ORDER — DICLOFENAC SODIUM 1 % EX GEL
2.0000 g | Freq: Four times a day (QID) | CUTANEOUS | Status: DC
  Administered 2022-03-05 – 2022-03-09 (×14): 2 g via TOPICAL
  Filled 2022-03-04 (×2): qty 100

## 2022-03-04 NOTE — Evaluation (Signed)
Physical Therapy Evaluation Patient Details Name: Matthew [name] MRN: [id number] DOB: 28-Oct-1945 Today's Date: 03/04/2022  History of Present Illness  Matthew [name] is a 76 y.o. male with medical history significant of hypertension, hyperlipidemia, history of coronary artery disease status post CABG, status post AVR, chronic kidney disease stage IV, peripheral arterial disease and GERD; who presented to the emergency department secondary to shortness of breath, chest pressure and dyspnea exertion.  Patient denies any focal deficits, coughing spells, hematuria, dysuria, melena, hematochezia, fever/chills or any other complaints.   Clinical Impression  Patient demonstrates labored movement for supine to sit transfer requiring HOB elevated and max assist to bring legs to EOB and to lift trunk, limited mostly due to generalized weakness and c/o pain with patient being unable to scoot to EOB. Patient also requiring contact guard assist in long sitting position due to poor trunk control. Patient taken off of oxygen with SpO2 at 98% on room air with activity and at rest. Patient left in bed on room air - nurse notified. Patient will benefit from continued skilled physical therapy in hospital and recommended venue below to increase strength, balance, endurance for safe ADLs and gait.     Recommendations for follow up therapy are one component of a multi-disciplinary discharge planning process, led by the attending physician.  Recommendations may be updated based on patient status, additional functional criteria and insurance authorization.  Follow Up Recommendations Skilled nursing-short term rehab (<3 hours/day) Can patient physically be transported by private vehicle: No    Assistance Recommended at Discharge    Patient can return home with the following  A lot of help with walking and/or transfers;A lot of help with bathing/dressing/bathroom;Assistance with cooking/housework;Help with stairs or  ramp for entrance    Equipment Recommendations None recommended by PT  Recommendations for Other Services       Functional Status Assessment Patient has had a recent decline in their functional status and demonstrates the ability to make significant improvements in function in a reasonable and predictable amount of time.     Precautions / Restrictions Precautions Precautions: Fall Restrictions Weight Bearing Restrictions: No      Mobility  Bed Mobility Overal bed mobility: Needs Assistance Bed Mobility: Supine to Sit     Supine to sit: Max assist, HOB elevated     General bed mobility comments: patient requires max assist and HOB elevated for supine to sit transfer, is unable to scoot to EOB, limited mostly due to generalized weakness and c/o pain    Transfers                        Ambulation/Gait                  Stairs            Wheelchair Mobility    Modified Rankin (Stroke Patients Only)       Balance Overall balance assessment: Needs assistance Sitting-balance support: Bilateral upper extremity supported, Feet unsupported (patient in long sitting on bed, unable to scoot to EOB to support feet) Sitting balance-Leahy Scale: Poor Sitting balance - Comments: poor in long sitting, requires contact guarding at trunk to maintain upright position Postural control: Posterior lean, Left lateral lean                                   Pertinent Vitals/Pain Pain Assessment Pain Assessment:  0-10 Pain Score: 7  Pain Location: elbows, wrist, knees Pain Descriptors / Indicators: Grimacing, Guarding, Sharp Pain Intervention(s): Limited activity within patient's tolerance, Monitored during session, Repositioned    Home Living Family/patient expects to be discharged to:: Other (Comment) (Independent living ([place]))                   Additional Comments: Patient resides at the [facility] for independent living     Prior Function Prior Level of Function : Independent/Modified Independent             Mobility Comments: uses a quad cane "most of the time" for ambulation and is a short distance community ambulator, does not drive ADLs Comments: Independent, gets some help for iADLs (cooking) from independent living facility     Hand Dominance   Dominant Hand: Right    Extremity/Trunk Assessment   Upper Extremity Assessment Upper Extremity Assessment: Generalized weakness    Lower Extremity Assessment Lower Extremity Assessment: Generalized weakness    Cervical / Trunk Assessment Cervical / Trunk Assessment: Normal  Communication   Communication: No difficulties  Cognition Arousal/Alertness: Awake/alert Behavior During Therapy: WFL for tasks assessed/performed Overall Cognitive Status: Within Functional Limits for tasks assessed                                          General Comments      Exercises     Assessment/Plan    PT Assessment Patient needs continued PT services  PT Problem List Decreased strength;Decreased activity tolerance;Decreased balance;Decreased mobility       PT Treatment Interventions DME instruction;Balance training;Gait training;Stair training;Functional mobility training;Patient/family education;Therapeutic activities;Therapeutic exercise    PT Goals (Current goals can be found in the Care Plan section)  Acute Rehab PT Goals Patient Stated Goal: return home PT Goal Formulation: With patient Time For Goal Achievement: 03/18/22 Potential to Achieve Goals: Good    Frequency Min 3X/week     Co-evaluation               AM-PAC PT "6 Clicks" Mobility  Outcome Measure Help needed turning from your back to your side while in a flat bed without using bedrails?: A Lot Help needed moving from lying on your back to sitting on the side of a flat bed without using bedrails?: A Lot Help needed moving to and from a bed to a chair  (including a wheelchair)?: A Lot Help needed standing up from a chair using your arms (e.g., wheelchair or bedside chair)?: A Lot Help needed to walk in hospital room?: A Lot Help needed climbing 3-5 steps with a railing? : Total 6 Click Score: 11    End of Session   Activity Tolerance: Patient tolerated treatment well;Patient limited by pain;Patient limited by fatigue Patient left: in bed;with bed alarm set;with call bell/phone within reach Nurse Communication: Mobility status PT Visit Diagnosis: Unsteadiness on feet (R26.81);Other abnormalities of gait and mobility (R26.89);Muscle weakness (generalized) (M62.81)    Time: 1448-1856 PT Time Calculation (min) (ACUTE ONLY): 24 min   Charges:   PT Evaluation $PT Eval Moderate Complexity: 1 Mod PT Treatments $Therapeutic Activity: 23-37 mins        Zigmund Gottron, SPT

## 2022-03-04 NOTE — Progress Notes (Signed)
Spoke with pt daughter [name] this shift.  Per daughter, and then confirmed by patient, pt agreeable to dialysis if indicated based off of ongoing labs.  Also, [name] states that pt already resides in an facility that has adjoining structure that offers skilled nursing for rehab should patient need these services upon discharge.

## 2022-03-04 NOTE — Progress Notes (Signed)
This writer assumed care for patient at 3pm, no changes this shift. Patient pain controlled with PO oxycodone , he is able to take meds whole, feeds himself , he is alert and oriented x 4. Patient awaiting SNF placement at this time.

## 2022-03-04 NOTE — Progress Notes (Signed)
Progress Note   [name] SWH:[id number] DOB: 04/11/1946 DOA: 02/28/2022     4 DOS: the patient was seen and examined on 03/04/2022   Brief hospital course: Matthew [name] is a 76 y.o. male with medical history significant of hypertension, hyperlipidemia, history of coronary artery disease status post CABG, status post AVR, chronic kidney disease stage IV, peripheral arterial disease and GERD; who presented to the emergency department secondary to shortness of breath, chest pressure and dyspnea exertion. Patient denies any focal deficits, coughing spells, hematuria, dysuria, melena, hematochezia, fever/chills or any other complaints.   Work-up demonstrating elevated BNP, hypoxia (new to patient) and significant worsening in his renal function.  TRH has been contacted to place patient in the hospital for further evaluation and management.  Nephrology and cardiology service consulted.  Assessment and Plan: 1-shortness of breath/acute hypoxic respiratory failure in the setting of acute on chronic diastolic heart failure. -Breathing improving and currently good saturation on room air appreciated -Denies shortness of breath and at time of evaluation no oxygen required. -Patient discussed with daughter and palliative care; on board with HD if needed. -Diuretics transitioned to oral route as per nephrology recommendations. -Continue low-sodium diet, daily weight and strict I's and O's -Patient creatinine once diuresis has been initiated has trended up some (creatinine 6.50) -Continue close monitoring of his electrolytes and renal function (which has continue worsening) -Continue to follow clinical response and REDs reading.  2-acute kidney injury on chronic renal failure stage IV -Continue to follow nephrology service recommendation -Continue strict I's and O's and daily weight -No signs of UTI -Renal ultrasound demonstrating chronic medical renal disease without hydronephrosis or  obstruction. -After IV diuresis provided creatinine has now continue trending up, currently 6.50 -Palliative care consulted and after discussion with daughter and patient they are interested in full scope of practice and on board with HD.  3-hypertension -Stable -Continue amlodipine and Coreg -Also receiving IV diuresis. -As needed hydralazine has been ordered for systolic blood pressure above 665 and/or diastolic blood pressure more than 110.  4-anemia of chronic kidney disease -Given history of coronary disease plan is for transfusion if hemoglobin less than 7.5 -Fecal occult blood test at time of admission was positive; stopping heparin products and using SCDs for DVT prophylaxis -Continue monitoring hemoglobin trend -IV iron and Epogen as per nephrology discretion. -Last hemoglobin level 9.3 -No signs of overt bleeding appreciated. -Repeat CBC in AM.  5-history of coronary artery disease -Continue aspirin and statin -Patient denies chest pain. -2D echo reassuring without wall motion abnormalities and no EKG changes for ischemia.  6-hyperlipidemia -Continue statin -Heart healthy diet discussed with patient.  7-history of gout -No acute flare currently -Continue Uloric  8-GERD -Continue Pepcid  9-stage II pressure injury -Localizing his coccyx; no signs of superimposed infection -Present at time of admission -Continue constant repositioning and preventive measures.  Subjective:  No chest pain, no nausea, no vomiting.  Weak and deconditioned.  No requiring oxygen supplementation.  Physical Exam: Vitals:   03/03/22 1000 03/03/22 1447 03/04/22 0435 03/04/22 1300  BP:  (!) 151/77 (!) 153/75 (!) 143/79  Pulse:  82 84 73  Resp:  18 20 18   Temp:  99.3 F (37.4 C) 98.7 F (37.1 C)   TempSrc:  Oral Oral   SpO2:  97% 99% 100%  Weight: 83 kg  81.6 kg   Height:       General exam: Alert, awake, oriented x 3; no chest pain, no nausea, no vomiting.  Reports breathing is a  stable.  Weak and deconditioned. No fever. Respiratory system:   No using accessory muscles.   Good saturation on room air. Cardiovascular system:RRR. No  Rubs or gallops; no JVD on exam. Gastrointestinal system: Abdomen is nondistended, soft and nontender. No organomegaly or masses felt. Normal bowel sounds heard. Central nervous system: Alert and oriented. No focal neurological deficits. Extremities: No Cyanosis or clubbing.  Trace edema appreciated bilaterally. Skin: No petechiae. Unchanged coccyx pressure injury without signs of superimposed infection. Psychiatry: Judgement and insight appear normal. Flact affect appreciated.  Data Reviewed: Magnesium: 1.9 CBC: WBCs 8.0, hemoglobin 9.3, platelet count 215 K Renal function panel: Sodium 136, potassium 4.2, chloride 103, bicarb 19, BUN 75, creatinine 6.50, calcium 8.7 and GFR 8. TSH: 2.299  Family Communication: No family at bedside.  Disposition: Status is: Inpatient Remains inpatient appropriate because: Treatment for acute on chronic renal failure and CHF exacerbation.   Planned Discharge Destination: Home  Time spent: 35 minutes  Author: Barton Dubois, MD 03/04/2022 3:12 PM  For on call review [email or web address].

## 2022-03-04 NOTE — TOC Progression Note (Addendum)
Transition of Care [facility]) - Progression Note    Patient Details  Name: CORIN TILLY MRN: [id number] Date of Birth: December 12, 1945  Transition of [facility] CM/SW Contact  Salome Arnt, [place] Phone Number: 03/04/2022, 2:04 PM  Clinical Narrative:  PT recommending SNF. LCSW left voicemail for Chasity at [facility] as pt has been an independent living resident there. Discussed with pt's daughter, [name] who was hopeful pt could return to [facility]. PT also spoke with her and shared that pt was max assist to sit on edge of bed and is not appropriate for ALF. LCSW shared that pt is approaching d/c and if he is unable to return to [facility], we will need to work on alternative d/c plan. [name] concerned that pt is close to being ready due to number of patients in hospital and needing bed. LCSW clarified that his discharge is based on medical stability only. Latasha agreeable to initiate bed search, with request for [facility]. LCSW sent out referral and followed up with pt's daughter, Jeannetta Nap as unable to reach [place]. Miranda notified [facility] unable to offer and she requests facility in [place]. Referral sent.     Expected Discharge Plan: [place] Barriers to Discharge: Continued Medical Work up  Expected Discharge Plan and Services Expected Discharge Plan: [place] In-house Referral: Clinical Social Work Discharge Planning Services: CM Consult   Living arrangements for the past 2 months: Single Family Home                                       Social Determinants of Health (SDOH) Interventions    Readmission Risk Interventions    03/03/2022   12:35 PM  Readmission Risk Prevention Plan  Transportation Screening Complete  Home Care Screening Complete  Medication Review (RN CM) Complete

## 2022-03-04 NOTE — NC FL2 (Signed)
[place] LEVEL OF CARE SCREENING TOOL     IDENTIFICATION  Patient Name: Matthew [name] Birthdate: Jan 13, 1946 Sex: male Admission Date (Current Location): 02/28/2022  [facility] [place] Number:  [employer] and Address:  [facility] [address] [place]      Provider Number: [id number]  Attending Physician Name and Address:  Barton Dubois, MD  Relative Name and Phone Number:       Current Level of Care: Hospital Recommended Level of Care: [place] Prior Approval Number:    Date Approved/Denied:   PASRR Number: [id number]  Discharge Plan: SNF    Current Diagnoses: Patient Active Problem List   Diagnosis Date Noted   CHF exacerbation ([place]) 02/28/2022   Pressure injury of skin 02/28/2022   CKD (chronic kidney disease), stage IV ([place]) 10/06/2018   Left upper extremity swelling 10/06/2018   Carotid artery stenosis 05/12/2018   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   Chest pain not due to acute coronary syndrome    Non-ST elevation (NSTEMI) myocardial infarction [facility])    PAD (peripheral artery disease) ([place])    Severe aortic stenosis    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   Great toe amputation status, left 04/22/2016   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 See d/c summary for updates.)  AMBULATORY STATUS COMMUNICATION OF NEEDS Skin   Extensive Assist Verbally Skin abrasions                        Personal Care Assistance Level of Assistance  Bathing, Feeding, Dressing Bathing Assistance: Maximum assistance Feeding assistance: Limited assistance Dressing Assistance: Maximum assistance     Functional Limitations Info  Sight, Hearing, Speech Sight Info: Adequate Hearing Info: Adequate Speech Info: Adequate    SPECIAL CARE FACTORS FREQUENCY  PT (By licensed PT)     PT Frequency: 5x weekly              Contractures      Additional Factors Info  Code Status, Allergies Code Status Info: Full code Allergies Info: No known allergies           Current Medications (03/04/2022):  This is the current hospital active medication list Current Facility-Administered Medications  Medication Dose Route Frequency Provider Last Rate Last Admin   0.9 %  sodium chloride infusion  250 mL Intravenous PRN Barton Dubois, MD 10 mL/hr at 02/28/22 2115 250 mL at 02/28/22 2115   acetaminophen (TYLENOL) tablet 650 mg  650 mg Oral Q6H PRN Zierle-Ghosh, Asia B, DO       Or   acetaminophen (TYLENOL) suppository 650 mg  650 mg Rectal Q6H PRN Zierle-Ghosh, Asia B, DO       albuterol (PROVENTIL) (2.5 MG/3ML) 0.083% nebulizer solution 2.5 mg  2.5 mg Nebulization Q2H PRN Zierle-Ghosh, Asia B, DO       amLODipine (NORVASC) tablet 10 mg  10 mg Oral  q morning Zierle-Ghosh, Asia B, DO   10 mg at 03/04/22 0017   aspirin EC tablet 81 mg  81 mg Oral Daily Zierle-Ghosh, Asia B, DO   81 mg at 03/04/22 0838   atorvastatin (LIPITOR) tablet 80 mg  80 mg Oral q1800 Zierle-Ghosh, Asia B, DO   80 mg at 03/03/22 1642   carvedilol (COREG) tablet 25 mg  25 mg Oral BID Zierle-Ghosh, Asia B, DO   25 mg at 03/04/22 4944   Darbepoetin Alfa (ARANESP) injection 100 mcg  100 mcg Subcutaneous Q Mon-1800 Corliss Parish, MD   100 mcg at 03/03/22 1643   famotidine (PEPCID) tablet 10 mg  10 mg Oral Daily Barton Dubois, MD   10 mg at 03/04/22 9675   febuxostat (ULORIC) tablet 40 mg  40 mg Oral  Daily Barton Dubois, MD   40 mg at 03/04/22 9163   furosemide (LASIX) tablet 40 mg  40 mg Oral BID Corliss Parish, MD   40 mg at 03/04/22 8466   hydrALAZINE (APRESOLINE) injection 10 mg  10 mg Intravenous Q6H PRN Barton Dubois, MD   10 mg at 03/02/22 0852   ondansetron (ZOFRAN) tablet 4 mg  4 mg Oral Q6H PRN Zierle-Ghosh, Asia B, DO       Or   ondansetron (ZOFRAN) injection 4 mg  4 mg Intravenous Q6H PRN Zierle-Ghosh, Asia B, DO       oxyCODONE (Oxy IR/ROXICODONE) immediate release tablet 5 mg  5 mg Oral Q6H PRN Barton Dubois, MD   5 mg at 03/04/22 1018   sodium chloride flush (NS) 0.9 % injection 3 mL  3 mL Intravenous Q12H Barton Dubois, MD   3 mL at 03/04/22 0839   sodium chloride flush (NS) 0.9 % injection 3 mL  3 mL Intravenous PRN Barton Dubois, MD         Discharge Medications: Please see discharge summary for a list of discharge medications.  Relevant Imaging Results:  Relevant Lab Results:   Additional Information SSN: [id number]. Daughter agreeable to use Medicare benefits.  Salome Arnt, LCSW
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
## 2022-03-04 NOTE — Progress Notes (Signed)
Subjective:  pt seems a little clearer today mentally -  I spoke with his daughter yesterday afternoon- she came to visit him last night and has talked him into the concept of dialysis.  He tells me he had a friend on dialysis for 3 years before he passed away-  saw what he went through and that is his hesitancy.  He tells me he got up out of bed yesterday ( nurse doubts)   Objective Vital signs in last 24 hours: Vitals:   03/03/22 0830 03/03/22 1000 03/03/22 1447 03/04/22 0435  BP: (!) 163/92  (!) 151/77 (!) 153/75  Pulse: 90  82 84  Resp: 16  18 20   Temp: 98.8 F (37.1 C)  99.3 F (37.4 C) 98.7 F (37.1 C)  TempSrc: Oral  Oral Oral  SpO2: 97%  97% 99%  Weight:  83 kg  81.6 kg  Height:       Weight change: 1.2 kg  Intake/Output Summary (Last 24 hours) at 03/04/2022 2505 Last data filed at 03/04/2022 3976 Gross per 24 hour  Intake 270 ml  Output 1050 ml  Net -780 ml    Assessment/Recommendations: 76 y.o. male with  HTN, HLD, CAD, MI, s/p CABG, s/p AVR, CKD IV, PAD who present w/ SOB/CP and AKI    Non-Oliguric AKI on CKD IV: BL Crt ~3.3-3.5 as of May but no recent labs. Seems to be progressive. Sees nephrologist at [facility] but no access to records. 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  Renal function if anything has worsened -  no acute indications for HD but appears to be failing to thrive so possibly chronic indications.   I mentioned dialysis yesterday -he immediately said " I dont want dialysis"  I asked if he knew what that meant and he said " I will die"   From what I see he would be poor candidate for HD-  I did discuss with daughter who came in and talked pt into doing dialysis.  It is unclear to me how much his renal failure is impacting him-  if he can have a reasonable QOL without HD I would prefer to put it off longer.  However, if he is failing to thrive due to his kidneys we may not have a choice but to do.  He is  baseline SNF resident.  I have not seen him do much here-  he may be a poor candidate for dialysis -  I see palliative has been consulted-  no action today from a dialysis standpoint    Cause of AKI unclear. Likely has some volume overload given HTN but lungs are clear and he says his LEE is at baseline. Now that aggressive diuresis has been stopped-  hoping for some plateau and improvement in renal function   -Patient does not have good insight into his disease. We discussed his BL CKD is fairly advanced and AKI is even more severe. No uremic symptoms but we discussed that HD may be necessary as above    SOB/CP/CHF: Presenting symptoms and states that they have essentially resolved at this time.  His lungs are clear, chest x-ray without pulmonary edema, lower extremity edema is present but he states it is at baseline. EF normal in 2020 as well as now.  - presuming diastolic dysfunction -  no action recommended by cardiology  I am going to stop the IV diuretics-  start some PO lasix today  HTN: restarting home BP meds but will do it slowly as not to drop BP too quickly -Restart amlodipine 10mg  daily and coreg 25mg  BID -Hold clonidine and restart if needed-- BP reasonable for now    Anemia: likely CKD contributing. FOBT positive -Obtain iron studies-- were low, repleting  -also will give ESA -transfuse for Hgb < 7    CAD/PAD: fairly extensive history: Mgmt per primary   Metabolic acidosis: Bicarb slightly low at 18.  Hopefully will improve with contraction.  is fine   Hypocalcemia: Corrects near normal when accounting for albumin.       Matthew [name]    Labs: Basic Metabolic Panel: Recent Labs  Lab 03/02/22 0353 03/03/22 0531 03/04/22 0348  NA 139 139 136  K 4.7 4.0 4.2  CL 108 105 103  CO2 18* 20* 19*  GLUCOSE 93 128* 110*  BUN 52* 60* 75*  CREATININE 5.18* 6.07* 6.50*  CALCIUM 8.8* 8.6* 8.7*  PHOS 5.4* 6.6* 6.9*   Liver Function Tests: Recent Labs  Lab  02/28/22 0032 03/02/22 0353 03/03/22 0531 03/04/22 0348  AST 15  --   --   --   ALT 15  --   --   --   ALKPHOS 61  --   --   --   BILITOT 0.4  --   --   --   PROT 6.9  --   --   --   ALBUMIN 3.3* 3.4* 3.3* 3.0*   No results for input(s): "LIPASE", "AMYLASE" in the last 168 hours. No results for input(s): "AMMONIA" in the last 168 hours. CBC: Recent Labs  Lab 02/28/22 0032 03/01/22 0536 03/02/22 0353  WBC 8.7 4.8 8.0  NEUTROABS 6.2 2.6  --   HGB 8.4* 7.8* 9.3*  HCT 26.3* 24.3* 29.2*  MCV 96.0 96.0 94.8  PLT 198 183 215   Cardiac Enzymes: No results for input(s): "CKTOTAL", "CKMB", "CKMBINDEX", "TROPONINI" in the last 168 hours. CBG: No results for input(s): "GLUCAP" in the last 168 hours.  Iron Studies:  No results for input(s): "IRON", "TIBC", "TRANSFERRIN", "FERRITIN" in the last 72 hours.  Studies/Results: No results found. Medications: Infusions:  sodium chloride 250 mL (02/28/22 2115)    Scheduled Medications:  amLODipine  10 mg Oral q morning   aspirin EC  81 mg Oral Daily   atorvastatin  80 mg Oral q1800   carvedilol  25 mg Oral BID   darbepoetin (ARANESP) injection - NON-DIALYSIS  100 mcg Subcutaneous Q Mon-1800   famotidine  10 mg Oral Daily   febuxostat  40 mg Oral Daily   furosemide  40 mg Oral BID   sodium chloride flush  3 mL Intravenous Q12H    have reviewed scheduled and prn medications.  Physical Exam: General: eyes closed, more clear mentally today  Heart: RRR Lungs: mostly clear Abdomen: soft, non tender Extremities: min edema -  s/p bilat toe amputations     03/04/2022,8:23 AM  LOS: 4 days

## 2022-03-04 NOTE — Plan of Care (Signed)
  Problem: Acute Rehab PT Goals(only PT should resolve) Goal: Pt will Roll Supine to Side Outcome: Progressing Flowsheets (Taken 03/04/2022 1153) Pt will Roll Supine to Side: with mod assist Goal: Patient Will Transfer Sit To/From Stand Outcome: Progressing Flowsheets (Taken 03/04/2022 1153) Patient will transfer sit to/from stand: with moderate assist Goal: Pt Will Transfer Bed To Chair/Chair To Bed Outcome: Progressing Flowsheets (Taken 03/04/2022 1153) Pt will Transfer Bed to Chair/Chair to Bed: with mod assist Goal: Pt Will Ambulate Outcome: Progressing Flowsheets (Taken 03/04/2022 1153) Pt will Ambulate:  with least restrictive assistive device  with moderate assist  15 feet   Zigmund Gottron, SPT

## 2022-03-05 DIAGNOSIS — I5031 Acute diastolic (congestive) heart failure: Secondary | ICD-10-CM | POA: Diagnosis not present

## 2022-03-05 DIAGNOSIS — N186 End stage renal disease: Secondary | ICD-10-CM

## 2022-03-05 DIAGNOSIS — Z992 Dependence on renal dialysis: Secondary | ICD-10-CM

## 2022-03-05 LAB — RENAL FUNCTION PANEL
Albumin: 2.8 g/dL — ABNORMAL LOW (ref 3.5–5.0)
Albumin: 2.7 g/dL — ABNORMAL LOW (ref 3.5–5.0)
Anion gap: 14 (ref 5–15)
Anion gap: 14 (ref 5–15)
BUN: 93 mg/dL — ABNORMAL HIGH (ref 8–23)
BUN: 96 mg/dL — ABNORMAL HIGH (ref 8–23)
CO2: 19 mmol/L — ABNORMAL LOW (ref 22–32)
CO2: 18 mmol/L — ABNORMAL LOW (ref 22–32)
Calcium: 8.4 mg/dL — ABNORMAL LOW (ref 8.9–10.3)
Calcium: 8.2 mg/dL — ABNORMAL LOW (ref 8.9–10.3)
Chloride: 101 mmol/L (ref 98–111)
Chloride: 101 mmol/L (ref 98–111)
Creatinine, Ser: 7.12 mg/dL — ABNORMAL HIGH (ref 0.61–1.24)
Creatinine, Ser: 7.35 mg/dL — ABNORMAL HIGH (ref 0.61–1.24)
GFR, Estimated: 7 mL/min — ABNORMAL LOW (ref >60)
GFR, Estimated: 7 mL/min — ABNORMAL LOW (ref >60)
Glucose, Bld: 109 mg/dL — ABNORMAL HIGH (ref 70–99)
Glucose, Bld: 137 mg/dL — ABNORMAL HIGH (ref 70–99)
Phosphorus: 8 mg/dL — ABNORMAL HIGH (ref 2.5–4.6)
Phosphorus: 8.3 mg/dL — ABNORMAL HIGH (ref 2.5–4.6)
Potassium: 4 mmol/L (ref 3.5–5.1)
Potassium: 4.1 mmol/L (ref 3.5–5.1)
Sodium: 134 mmol/L — ABNORMAL LOW (ref 135–145)
Sodium: 133 mmol/L — ABNORMAL LOW (ref 135–145)

## 2022-03-05 LAB — CBC
HCT: 25.5 % — ABNORMAL LOW (ref 39.0–52.0)
Hemoglobin: 8.2 g/dL — ABNORMAL LOW (ref 13.0–17.0)
MCH: 30.6 pg (ref 26.0–34.0)
MCHC: 32.2 g/dL (ref 30.0–36.0)
MCV: 95.1 fL (ref 80.0–100.0)
Platelets: 227 10*3/uL (ref 150–400)
RBC: 2.68 MIL/uL — ABNORMAL LOW (ref 4.22–5.81)
RDW: 13 % (ref 11.5–15.5)
WBC: 10.8 10*3/uL — ABNORMAL HIGH (ref 4.0–10.5)
nRBC: 0 % (ref 0.0–0.2)

## 2022-03-05 LAB — SURGICAL PCR SCREEN
MRSA, PCR: NEGATIVE
Staphylococcus aureus: NEGATIVE

## 2022-03-05 LAB — HEPATITIS B SURFACE ANTIGEN: Hepatitis B Surface Ag: NONREACTIVE

## 2022-03-05 MED ORDER — CHLORHEXIDINE GLUCONATE CLOTH 2 % EX PADS
6.0000 | MEDICATED_PAD | Freq: Every day | CUTANEOUS | Status: DC
  Administered 2022-03-07 – 2022-03-11 (×5): 6 via TOPICAL

## 2022-03-05 MED ORDER — CHLORHEXIDINE GLUCONATE CLOTH 2 % EX PADS
6.0000 | MEDICATED_PAD | Freq: Once | CUTANEOUS | Status: AC
  Administered 2022-03-05 – 2022-03-06 (×2): 6 via TOPICAL

## 2022-03-05 MED ORDER — CEFAZOLIN SODIUM-DEXTROSE 2-4 GM/100ML-% IV SOLN
2.0000 g | INTRAVENOUS | Status: AC
  Administered 2022-03-06: 2 g via INTRAVENOUS
  Filled 2022-03-05: qty 100

## 2022-03-05 MED ORDER — CHLORHEXIDINE GLUCONATE CLOTH 2 % EX PADS: 6.0000 | MEDICATED_PAD | Freq: Once | CUTANEOUS | Status: DC
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
## 2022-03-05 NOTE — Progress Notes (Signed)
PROGRESS NOTE  Matthew [name] QQV:[id number] DOB: 1945/06/05 DOA: 02/28/2022 PCP: Charlsie Merles, MD  Brief History:  Matthew [name] is a 76 y.o. male with medical history significant of hypertension, hyperlipidemia, history of coronary artery disease status post CABG, status post AVR, chronic kidney disease stage IV, peripheral arterial disease and GERD; who presented to the emergency department secondary to shortness of breath, chest pressure and dyspnea exertion. Patient denies any focal deficits, coughing spells, hematuria, dysuria, melena, hematochezia, fever/chills or any other complaints.   Work-up demonstrating elevated BNP, hypoxia (new to patient) and significant worsening in his renal function.  TRH has been contacted to place patient in the hospital for further evaluation and management.  Nephrology and cardiology service consulted.  He was started in IV lasix with symptomatic improvement.  Unfortunately, his serum creatinine continued to worsen.  After discussions with palliative medicine and the medical team, patient decided to pursue HD.  General surgery was consulted and plans for tunneled HD catheter on 03/06/22.    Assessment and Plan: Acute respiratory failure with hypoxia -due to pulmonary edema and fluid overload -improving with diuresis -weaned to RA  Acute HFpEF -02/28/22 Echo--EF 65-70%, no WMA, normal RVF, bioprosthetic AVR -clinically improving on IV lasix -Diuretics transitioned to oral route 10/31 -daily weight and strict I's and O's -Patient creatinine once diuresis has been initiated has trended up some (creatinine 6.50)   acute kidney injury on chronic renal failure stage IV -baseline creatinine 3.3-3.5 -presented with serum creatinine 5.89 -Continue to follow nephrology service recommendation -Continue strict I's and O's and daily weight -No signs of UTI -Renal ultrasound demonstrating chronic medical renal disease without hydronephrosis  or obstruction. -After IV diuresis provided creatinine continues to trend up -Palliative care consulted and after discussion with daughter and patient they are interested in full scope of practice and wish to pursue HD -general surgery consulted>>tunneled HD catheter 11/2   hypertension -Stable -Continue amlodipine and Coreg -Also receiving IV diuresis. -As needed hydralazine has been ordered for systolic blood pressure above 332 and/or diastolic blood pressure more than 110.   anemia of chronic kidney disease -Given history of coronary disease plan is for transfusion if hemoglobin less than 7.5 -Fecal occult blood test at time of admission was positive; stopping heparin products and using SCDs for DVT prophylaxis -Continue monitoring hemoglobin trend -IV iron and Epogen as per nephrology discretion. -Last hemoglobin level 9.3 -No signs of overt bleeding appreciated. -Hgb has remained stable for the hospitalization   coronary artery disease -no chest pain -Continue aspirin and statin -Patient denies chest pain. -2D echo--EF 65-70%, no WMA, normal RVF   hyperlipidemia -Continue statin -Heart healthy diet discussed with patient.   gout -No acute flare currently -Continue Uloric   GERD -Continue Pepcid   stage II pressure injury -Localizing his coccyx; no signs of superimposed infection -Present at time of admission -Continue constant repositioning and preventive measures.    Family Communication:   daughter updated 11/1  Consultants:  renal, general surgery, palliative  Code Status:  FULL  DVT Prophylaxis:  SCDs   Procedures: As Listed in Progress Note Above  Antibiotics: None      Subjective: Patient has some nausea, no appetite.  Denies f/c, cp, sob, abd pain, headache.  Objective: Vitals:   03/04/22 1300 03/04/22 2052 03/05/22 0500 03/05/22 1153  BP: (!) 143/79 (!) 156/86 118/68 114/68  Pulse: 73 81 80 74  Resp: 18 16 19  18  Temp:  98.7 F  (37.1 C) 97.6 F (36.4 C) 98.4 F (36.9 C)  TempSrc:      SpO2: 100% 99% 100% 98%  Weight:   80.5 kg   Height:        Intake/Output Summary (Last 24 hours) at 03/05/2022 1713 Last data filed at 03/05/2022 1300 Gross per 24 hour  Intake 480 ml  Output 1200 ml  Net -720 ml   Weight change: -2.5 kg Exam:  General:  Pt is alert, follows commands appropriately, not in acute distress HEENT: No icterus, No thrush, No neck mass, [place]/AT Cardiovascular: RRR, S1/S2, no rubs, no gallops Respiratory: CTA bilaterally, no wheezing, no crackles, no rhonchi Abdomen: Soft/+BS, non tender, non distended, no guarding Extremities: trace LE edema, No lymphangitis, No petechiae, No rashes, no synovitis   Data Reviewed: I have personally reviewed following labs and imaging studies Basic Metabolic Panel: Recent Labs  Lab 03/01/22 0536 03/02/22 0353 03/03/22 0531 03/04/22 0348 03/05/22 0451 03/05/22 1001  NA 141 139 139 136 134* 133*  K 4.5 4.7 4.0 4.2 4.0 4.1  CL 113* 108 105 103 101 101  CO2 20* 18* 20* 19* 19* 18*  GLUCOSE 81 93 128* 110* 109* 137*  BUN 52* 52* 60* 75* 93* 96*  CREATININE 5.13* 5.18* 6.07* 6.50* 7.12* 7.35*  CALCIUM 8.3* 8.8* 8.6* 8.7* 8.4* 8.2*  MG 1.9  --   --   --   --   --   PHOS  --  5.4* 6.6* 6.9* 8.0* 8.3*   Liver Function Tests: Recent Labs  Lab 02/28/22 0032 03/02/22 0353 03/03/22 0531 03/04/22 0348 03/05/22 0451 03/05/22 1001  AST 15  --   --   --   --   --   ALT 15  --   --   --   --   --   ALKPHOS 61  --   --   --   --   --   BILITOT 0.4  --   --   --   --   --   PROT 6.9  --   --   --   --   --   ALBUMIN 3.3* 3.4* 3.3* 3.0* 2.8* 2.7*   No results for input(s): "LIPASE", "AMYLASE" in the last 168 hours. No results for input(s): "AMMONIA" in the last 168 hours. Coagulation Profile: No results for input(s): "INR", "PROTIME" in the last 168 hours. CBC: Recent Labs  Lab 02/28/22 0032 03/01/22 0536 03/02/22 0353 03/05/22 0451  WBC 8.7 4.8 8.0  10.8*  NEUTROABS 6.2 2.6  --   --   HGB 8.4* 7.8* 9.3* 8.2*  HCT 26.3* 24.3* 29.2* 25.5*  MCV 96.0 96.0 94.8 95.1  PLT 198 183 215 227   Cardiac Enzymes: No results for input(s): "CKTOTAL", "CKMB", "CKMBINDEX", "TROPONINI" in the last 168 hours. BNP: Invalid input(s): "POCBNP" CBG: No results for input(s): "GLUCAP" in the last 168 hours. HbA1C: No results for input(s): "HGBA1C" in the last 72 hours. Urine analysis:    Component Value Date/Time   COLORURINE YELLOW 02/28/2022 0112   APPEARANCEUR CLEAR 02/28/2022 0112   LABSPEC 1.013 02/28/2022 0112   PHURINE 5.0 02/28/2022 0112   GLUCOSEU NEGATIVE 02/28/2022 0112   HGBUR NEGATIVE 02/28/2022 0112   BILIRUBINUR NEGATIVE 02/28/2022 0112   KETONESUR NEGATIVE 02/28/2022 0112   PROTEINUR >=300 (A) 02/28/2022 0112   NITRITE NEGATIVE 02/28/2022 0112   LEUKOCYTESUR SMALL (A) 02/28/2022 0112   Sepsis Labs: @LABRCNTIP (procalcitonin:4,lacticidven:4) ) Recent Results (  from the past 240 hour(s))  Resp Panel by RT-PCR (Flu A&B, Covid) Anterior Nasal Swab     Status: None   Collection Time: 02/28/22 12:25 AM   Specimen: Anterior Nasal Swab  Result Value Ref Range Status   SARS Coronavirus 2 by RT PCR NEGATIVE NEGATIVE Final    Comment: (NOTE) SARS-CoV-2 target nucleic acids are NOT DETECTED.  The SARS-CoV-2 RNA is generally detectable in upper respiratory specimens during the acute phase of infection. The lowest concentration of SARS-CoV-2 viral copies this assay can detect is 138 copies/mL. A negative result does not preclude SARS-Cov-2 infection and should not be used as the sole basis for treatment or other patient management decisions. A negative result may occur with  improper specimen collection/handling, submission of specimen other than nasopharyngeal swab, presence of viral mutation(s) within the areas targeted by this assay, and inadequate number of viral copies(<138 copies/mL). A negative result must be combined  with clinical observations, patient history, and epidemiological information. The expected result is Negative.  Fact Sheet for Patients:  EntrepreneurPulse.com.au  Fact Sheet for Healthcare Providers:  IncredibleEmployment.be  This test is no t yet approved or cleared by the [place] FDA and  has been authorized for detection and/or diagnosis of SARS-CoV-2 by FDA under an Emergency Use Authorization (EUA). This EUA will remain  in effect (meaning this test can be used) for the duration of the COVID-19 declaration under Section 564(b)(1) of the Act, 21 U.S.C.section 360bbb-3(b)(1), unless the authorization is terminated  or revoked sooner.       Influenza A by PCR NEGATIVE NEGATIVE Final   Influenza B by PCR NEGATIVE NEGATIVE Final    Comment: (NOTE) The Xpert Xpress SARS-CoV-2/FLU/RSV plus assay is intended as an aid in the diagnosis of influenza from Nasopharyngeal swab specimens and should not be used as a sole basis for treatment. Nasal washings and aspirates are unacceptable for Xpert Xpress SARS-CoV-2/FLU/RSV testing.  Fact Sheet for Patients: EntrepreneurPulse.com.au  Fact Sheet for Healthcare Providers: IncredibleEmployment.be  This test is not yet approved or cleared by the [place] FDA and has been authorized for detection and/or diagnosis of SARS-CoV-2 by FDA under an Emergency Use Authorization (EUA). This EUA will remain in effect (meaning this test can be used) for the duration of the COVID-19 declaration under Section 564(b)(1) of the Act, 21 U.S.C. section 360bbb-3(b)(1), unless the authorization is terminated or revoked.  Performed at [facility], [address], [place], [place] [address]   Urine Culture     Status: None   Collection Time: 02/28/22  1:12 AM   Specimen: Urine, Clean Catch  Result Value Ref Range Status   Specimen Description   Final    URINE, CLEAN  CATCH Performed at [facility], [address] [place], [place] [address]    Special Requests   Final    NONE Performed at [facility], [address] [place], [place] [address]    Culture   Final    NO GROWTH Performed at [facility], [address], [place], [place] [address]    Report Status 03/01/2022 FINAL  Final  MRSA Next Gen by PCR, Nasal     Status: None   Collection Time: 02/28/22 12:01 PM   Specimen: Nasal Mucosa; Nasal Swab  Result Value Ref Range Status   MRSA by PCR Next Gen NOT DETECTED NOT DETECTED Final    Comment: (NOTE) The GeneXpert MRSA Assay (FDA approved for NASAL specimens only), is one component of a  comprehensive MRSA colonization surveillance program. It is not intended to diagnose MRSA infection nor to guide or monitor treatment for MRSA infections. Test performance is not FDA approved in patients less than 36 years old. Performed at [facility], [address], [place], [place] [address]      Scheduled Meds:  amLODipine  10 mg Oral q morning   aspirin EC  81 mg Oral Daily   atorvastatin  80 mg Oral q1800   carvedilol  25 mg Oral BID   Chlorhexidine Gluconate Cloth  6 each Topical Q0600   Chlorhexidine Gluconate Cloth  6 each Topical Once   And   Chlorhexidine Gluconate Cloth  6 each Topical Once   darbepoetin (ARANESP) injection - NON-DIALYSIS  100 mcg Subcutaneous Q Mon-1800   diclofenac Sodium  2 g Topical QID   famotidine  10 mg Oral Daily   febuxostat  40 mg Oral Daily   furosemide  40 mg Oral BID   sodium chloride flush  3 mL Intravenous Q12H   Continuous Infusions:  sodium chloride 250 mL (02/28/22 2115)   [START ON 03/06/2022]  ceFAZolin (ANCEF) IV      Procedures/Studies: US RENAL  Result Date: 02/28/2022 CLINICAL DATA:  Acute renal insufficiency EXAM: RENAL / URINARY TRACT ULTRASOUND COMPLETE COMPARISON:  03/09/2021, 06/25/2021 FINDINGS: Right Kidney: Renal measurements: 11.2 x 5.0 x 5.9 cm = volume: 172.5 mL. Increased  renal cortical echotexture. There is a benign simple cyst measuring 1.5 cm off the ventral mid aspect of the kidney. No specific imaging follow-up is required. No hydronephrosis or nephrolithiasis. Left Kidney: Renal measurements: 13.8 x 7.7 by 7.9 cm = volume: 440.2 mL. There is left renal cortical thinning, with increased renal cortical echotexture. Multiple cysts are again identified, largest measuring up to 6.4 cm. No hydronephrosis or nephrolithiasis. Bladder: Appears normal for degree of bladder distention. Other: None. IMPRESSION: 1. Bilateral increased renal cortical echotexture, left renal cortical thinning, consistent with medical renal disease. 2. Bilateral renal cortical cysts, unchanged since prior exam. Electronically Signed   By: Randa Ngo M.D.   On: 02/28/2022 15:24   ECHOCARDIOGRAM COMPLETE  Result Date: 02/28/2022    ECHOCARDIOGRAM REPORT   Patient Name:   Matthew [name] Date of Exam: 02/28/2022 Medical Rec #:  [id number]       Height:       68.0 in Accession #:    [id number]      Weight:       180.0 lb Date of Birth:  02/01/1946       BSA:          1.954 m Patient Age:    61 years        BP:           197/83 mmHg Patient Gender: M               HR:           71 bpm. Exam Location:  [facility] Procedure: 2D Echo, Cardiac Doppler and Color Doppler Indications:    Congestive Heart Failure I50.9  History:        Patient has prior history of Echocardiogram examinations, most                 recent 10/07/2018. Prior CABG; Risk Factors:Hypertension,                 Dyslipidemia and Pre-diabetes. CKD (chronic kidney disease),  stage III [facility]), Non-ST elevation (NSTEMI) myocardial infarction                 [facility]), s/p AVR.  Sonographer:    Alvino Chapel RCS Referring Phys: [id number] ASIA B [place]  1. Left ventricular ejection fraction, by estimation, is 65 to 70%. The left ventricle has normal function. The left ventricle has no regional wall motion abnormalities.  There is moderate left ventricular hypertrophy. Left ventricular diastolic parameters are indeterminate. Elevated left atrial pressure.  2. Right ventricular systolic function is normal. The right ventricular size is normal.  3. Left atrial size was mildly dilated.  4. Right atrial size was mildly dilated.  5. The mitral valve is abnormal. Mild mitral valve regurgitation. No evidence of mitral stenosis.  6. A 34mm Edwards INSPIRIS RESILIA PERICARDIAL VALVE bioprosthesis valve     is present in the aortic position. The aortic valve has been repaired/replaced. Aortic valve regurgitation is not visualized. No aortic stenosis is present.  7. The inferior vena cava is normal in size with greater than 50% respiratory variability, suggesting right atrial pressure of 3 mmHg. FINDINGS  Left Ventricle: Left ventricular ejection fraction, by estimation, is 65 to 70%. The left ventricle has normal function. The left ventricle has no regional wall motion abnormalities. The left ventricular internal cavity size was normal in size. There is  moderate left ventricular hypertrophy. Left ventricular diastolic parameters are indeterminate. Elevated left atrial pressure. Right Ventricle: The right ventricular size is normal. Right vetricular wall thickness was not well visualized. Right ventricular systolic function is normal. Left Atrium: Left atrial size was mildly dilated. Right Atrium: Right atrial size was mildly dilated. Pericardium: There is no evidence of pericardial effusion. Mitral Valve: The mitral valve is abnormal. There is mild thickening of the mitral valve leaflet(s). There is mild calcification of the mitral valve leaflet(s). Mild mitral annular calcification. Mild mitral valve regurgitation. No evidence of mitral valve stenosis. Tricuspid Valve: The tricuspid valve is normal in structure. Tricuspid valve regurgitation is trivial. No evidence of tricuspid stenosis. Aortic Valve: A 37mm Edwards INSPIRIS RESILIA  PERICARDIAL VALVE bioprosthesis valve is present in the aortic position. The aortic valve has been repaired/replaced. Aortic valve regurgitation is not visualized. No aortic stenosis is present. Aortic valve mean gradient measures 15.5 mmHg. Aortic valve peak gradient measures 31.8 mmHg. Aortic valve area, by VTI measures 1.24 cm. Pulmonic Valve: The pulmonic valve was not well visualized. Pulmonic valve regurgitation is not visualized. No evidence of pulmonic stenosis. Aorta: The aortic root is normal in size and structure. Venous: The inferior vena cava is normal in size with greater than 50% respiratory variability, suggesting right atrial pressure of 3 mmHg. IAS/Shunts: No atrial level shunt detected by color flow Doppler.  LEFT VENTRICLE PLAX 2D LVIDd:         4.00 cm   Diastology LVIDs:         2.20 cm   LV e' medial:    5.87 cm/s LV PW:         1.30 cm   LV E/e' medial:  23.0 LV IVS:        1.30 cm   LV e' lateral:   8.38 cm/s LVOT diam:     1.80 cm   LV E/e' lateral: 16.1 LV SV:         78 LV SV Index:   40 LVOT Area:     2.54 cm  RIGHT VENTRICLE RV S prime:  14.50 cm/s TAPSE (M-mode): 2.0 cm LEFT ATRIUM             Index        RIGHT ATRIUM           Index LA diam:        4.00 cm 2.05 cm/m   RA Area:     22.80 cm LA Vol (A2C):   80.5 ml 41.19 ml/m  RA Volume:   76.20 ml  38.99 ml/m LA Vol (A4C):   65.5 ml 33.52 ml/m LA Biplane Vol: 77.9 ml 39.86 ml/m  AORTIC VALVE AV Area (Vmax):    1.06 cm AV Area (Vmean):   1.18 cm AV Area (VTI):     1.24 cm AV Vmax:           282.00 cm/s AV Vmean:          179.000 cm/s AV VTI:            0.634 m AV Peak Grad:      31.8 mmHg AV Mean Grad:      15.5 mmHg LVOT Vmax:         117.00 cm/s LVOT Vmean:        83.100 cm/s LVOT VTI:          0.308 m LVOT/AV VTI ratio: 0.49  AORTA Ao Root diam: 3.40 cm MITRAL VALVE MV Area (PHT): 5.27 cm     SHUNTS MV Decel Time: 144 msec     Systemic VTI:  0.31 m MV E velocity: 135.00 cm/s  Systemic Diam: 1.80 cm MV A velocity:  123.00 cm/s MV E/A ratio:  1.10 Carlyle Dolly MD Electronically signed by Carlyle Dolly MD Signature Date/Time: 02/28/2022/1:33:18 PM    Final    DG Chest 2 View  Result Date: 02/28/2022 CLINICAL DATA:  Shortness of breath. EXAM: CHEST - 2 VIEW COMPARISON:  Chest radiograph dated 08/05/2021. FINDINGS: Trace pleural effusion on the lateral view. No focal consolidation, or pneumothorax. The cardiac silhouette is within limits. Median sternotomy wires and mechanical cardiac valve. No acute osseous pathology. IMPRESSION: Trace pleural effusions.  No focal consolidation. Electronically Signed   By: Anner Crete M.D.   On: 02/28/2022 01:02    Orson Eva, DO  [facility]  If 7PM-7AM, please contact night-coverage [email or web address] Password [email or web address] 03/05/2022, 5:13 PM   LOS: 5 days

## 2022-03-05 NOTE — Anesthesia Preprocedure Evaluation (Signed)
Anesthesia Evaluation  Patient identified by MRN, date of birth, ID band Patient awake    Reviewed: Allergy & Precautions, NPO status , Patient's Chart, lab work & pertinent test results, reviewed documented beta blocker date and time   History of Anesthesia Complications Negative for: history of anesthetic complications  Airway Mallampati: II  TM Distance: >3 FB Neck ROM: Full    Dental  (+) Dental Advisory Given, Edentulous Upper, Missing   Pulmonary sleep apnea , Current Smoker and Patient abstained from smoking.   Pulmonary exam normal breath sounds clear to auscultation       Cardiovascular Exercise Tolerance: Good hypertension, Pt. on medications and Pt. on home beta blockers + CAD, + Past MI, + Cardiac Stents, + CABG, + Peripheral Vascular Disease and +CHF  + Valvular Problems/Murmurs (AVR) AS  Rhythm:Regular Rate:Normal + Systolic murmurs 1. Left ventricular ejection fraction, by estimation, is 65 to 70%. The left ventricle has normal function. The left ventricle has no regional  wall motion abnormalities. There is moderate left ventricular hypertrophy.  Left ventricular diastolic parameters are indeterminate. Elevated left atrial pressure.   2. Right ventricular systolic function is normal. The right ventricular size is normal.   3. Left atrial size was mildly dilated.   4. Right atrial size was mildly dilated.   5. The mitral valve is abnormal. Mild mitral valve regurgitation. No evidence of mitral stenosis.   6. A 52mm Edwards INSPIRIS RESILIA PERICARDIAL VALVE bioprosthesis valve      is present in the aortic position. The aortic valve has been repaired/replaced. Aortic valve regurgitation is not visualized. No aortic stenosis is present.   7. The inferior vena cava is normal in size with greater than 50% respiratory variability, suggesting right atrial pressure of 3 mmHg.    Neuro/Psych  PSYCHIATRIC DISORDERS Anxiety      negative neurological ROS     GI/Hepatic negative GI ROS, Neg liver ROS,,,  Endo/Other  negative endocrine ROS    Renal/GU ESRFRenal disease  negative genitourinary   Musculoskeletal  (+) Arthritis , Osteoarthritis,    Abdominal   Peds negative pediatric ROS (+)  Hematology negative hematology ROS (+)   Anesthesia Other Findings 28-Feb-2022 00:07:40 [facility] ROUTINE RECORD [id number] (18 yr) Male Black Vent. rate 80 BPM PR interval 160 ms QRS duration 105 ms QT/QTcB 435/502 ms P-R-T axes 79 37 33 Sinus rhythm Minimal ST depression, lateral leads Prolonged QT interval Confirmed by Orpah Greek [phone]) on 02/28/2022 3:11:53 AM  Reproductive/Obstetrics negative OB ROS                              Anesthesia Physical Anesthesia Plan  ASA: 4  Anesthesia Plan: General   Post-op Pain Management:    Induction: Intravenous  PONV Risk Score and Plan: 2 and Ondansetron and Propofol infusion  Airway Management Planned: Nasal Cannula and Natural Airway  Additional Equipment:   Intra-op Plan:   Post-operative Plan:   Informed Consent: I have reviewed the patients History and Physical, chart, labs and discussed the procedure including the risks, benefits and alternatives for the proposed anesthesia with the patient or authorized representative who has indicated his/her understanding and acceptance.     Dental advisory given  Plan Discussed with: CRNA and Surgeon  Anesthesia Plan Comments:          Anesthesia Quick Evaluation

## 2022-03-05 NOTE — Progress Notes (Signed)
Palliative:  Chart review completed.  Conference with attending, bedside nursing staff, transition of care team related to patient condition, goals of care.  At this point Matthew [name] has continued to have worsening kidney function and is scheduled for HD catheter placement.    PMT to continue to follow.  Plan: At this point continue full scope/full code.  Catheter placement anticipating imminent need for hemodialysis.  No charge. Quinn Axe, NP Palliative medicine team Team phone [phone] Greater than 50% of this time was spent counseling and coordinating care related to the above assessment and plan.

## 2022-03-05 NOTE — Progress Notes (Signed)
Patient has been alert and oriented this shift, and vitals stable. Patient has had consistent c/o of pain to knee, which is chronic.  Patient has been medicated according to [facility]. Patient also received an order for voltaren cream for his knee.  Patient received max assist to turn in bed. No other issues this shift.

## 2022-03-05 NOTE — Progress Notes (Addendum)
Patient ID: Matthew [name], male   DOB: [birth year], 76 y.o.   MRN: [id number] S:Pt complains of left foot pain. Admits to loss of appetite.  Discussed worsening renal function and discussed dialysis vs hospice.  "If I need dialysis, I have to do it".   O:BP 118/68 (BP Location: Left Arm)   Pulse 80   Temp 97.6 F (36.4 C)   Resp 19   Ht 5\' 8"  (1.727 m)   Wt 80.5 kg   SpO2 100%   BMI 26.98 kg/m   Intake/Output Summary (Last 24 hours) at 03/05/2022 0908 Last data filed at 03/05/2022 0603 Gross per 24 hour  Intake 360 ml  Output 700 ml  Net -340 ml   Intake/Output: I/O last 3 completed shifts: In: 480 [P.O.:480] Out: 1750 [Urine:1750]  Intake/Output this shift:  No intake/output data recorded. Weight change: -2.5 kg Gen:NAD CVS: RRR, no rub Resp: CTA Abd: +BS, soft, NT/ND Ext: trace pretibial edema.  Recent Labs  Lab 02/28/22 0032 03/01/22 0536 03/02/22 0353 03/03/22 0531 03/04/22 0348 03/05/22 0451  NA 141 141 139 139 136 134*  K 4.6 4.5 4.7 4.0 4.2 4.0  CL 114* 113* 108 105 103 101  CO2 18* 20* 18* 20* 19* 19*  GLUCOSE 95 81 93 128* 110* 109*  BUN 52* 52* 52* 60* 75* 93*  CREATININE 5.89* 5.13* 5.18* 6.07* 6.50* 7.12*  ALBUMIN 3.3*  --  3.4* 3.3* 3.0* 2.8*  CALCIUM 8.0* 8.3* 8.8* 8.6* 8.7* 8.4*  PHOS  --   --  5.4* 6.6* 6.9* 8.0*  AST 15  --   --   --   --   --   ALT 15  --   --   --   --   --    Liver Function Tests: Recent Labs  Lab 02/28/22 0032 03/02/22 0353 03/03/22 0531 03/04/22 0348 03/05/22 0451  AST 15  --   --   --   --   ALT 15  --   --   --   --   ALKPHOS 61  --   --   --   --   BILITOT 0.4  --   --   --   --   PROT 6.9  --   --   --   --   ALBUMIN 3.3*   < > 3.3* 3.0* 2.8*   < > = values in this interval not displayed.   No results for input(s): "LIPASE", "AMYLASE" in the last 168 hours. No results for input(s): "AMMONIA" in the last 168 hours. CBC: Recent Labs  Lab 02/28/22 0032 03/01/22 0536 03/02/22 0353 03/05/22 0451  WBC 8.7  4.8 8.0 10.8*  NEUTROABS 6.2 2.6  --   --   HGB 8.4* 7.8* 9.3* 8.2*  HCT 26.3* 24.3* 29.2* 25.5*  MCV 96.0 96.0 94.8 95.1  PLT 198 183 215 227   Cardiac Enzymes: No results for input(s): "CKTOTAL", "CKMB", "CKMBINDEX", "TROPONINI" in the last 168 hours. CBG: No results for input(s): "GLUCAP" in the last 168 hours.  Iron Studies: No results for input(s): "IRON", "TIBC", "TRANSFERRIN", "FERRITIN" in the last 72 hours. Studies/Results: No results found.  amLODipine  10 mg Oral q morning   aspirin EC  81 mg Oral Daily   atorvastatin  80 mg Oral q1800   carvedilol  25 mg Oral BID   darbepoetin (ARANESP) injection - NON-DIALYSIS  100 mcg Subcutaneous Q Mon-1800   diclofenac Sodium  2 g Topical QID  famotidine  10 mg Oral Daily   febuxostat  40 mg Oral Daily   furosemide  40 mg Oral BID   sodium chloride flush  3 mL Intravenous Q12H    BMET    Component Value Date/Time   NA 134 (L) 03/05/2022 0451   K 4.0 03/05/2022 0451   CL 101 03/05/2022 0451   CO2 19 (L) 03/05/2022 0451   GLUCOSE 109 (H) 03/05/2022 0451   BUN 93 (H) 03/05/2022 0451   CREATININE 7.12 (H) 03/05/2022 0451   CALCIUM 8.4 (L) 03/05/2022 0451   GFRNONAA 7 (L) 03/05/2022 0451   GFRAA 34 (L) 10/07/2018 0651   CBC    Component Value Date/Time   WBC 10.8 (H) 03/05/2022 0451   RBC 2.68 (L) 03/05/2022 0451   HGB 8.2 (L) 03/05/2022 0451   HCT 25.5 (L) 03/05/2022 0451   PLT 227 03/05/2022 0451   MCV 95.1 03/05/2022 0451   MCH 30.6 03/05/2022 0451   MCHC 32.2 03/05/2022 0451   RDW 13.0 03/05/2022 0451   LYMPHSABS 1.2 03/01/2022 0536   MONOABS 0.7 03/01/2022 0536   EOSABS 0.3 03/01/2022 0536   BASOSABS 0.0 03/01/2022 0536     Assessment/Plan:  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  He is a marginal candidate for dialysis given his poor functional and nutritional status, however he wants dialysis instead of hospice.  Will consult  surgery for [facility] placement and will need CM/SW assistance with placement for outpatient HD at the [facility] HD unit.  Will plan for HD tomorrow after [facility] placement.  I spoke with Dr. Constance Haw who kindly agreed to place [facility] tomorrow. Acute on chronic CHF - improved with diuresis. 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 Moderate protein malnutrition - will need protein supplementation Disposition - will need outpatient HD arrangements in [place] prior to discharge.   Donetta Potts, MD [facility]

## 2022-03-05 NOTE — TOC Progression Note (Addendum)
Transition of Care [facility]) - Progression Note    Patient Details  Name: Matthew [name] MRN: [id number] Date of Birth: 1945/11/01  Transition of Care [facility]) CM/SW Contact  Salome Arnt, [place] Phone Number: 03/05/2022, 8:55 AM  Clinical Narrative: Bed offers provided to pt and pt's daughter, [name] and they accept [place]. CMA will start insurance authorization.     Addendum: Nephrology initiating dialysis. Discussed with pt and daughter who request [facility] while pt is at [facility]. Pt may request transfer to [facility] after SNF. CMA to start referral.   Expected Discharge Plan: [place] Barriers to Discharge: Continued Medical Work up  Expected Discharge Plan and Services Expected Discharge Plan: [place] In-house Referral: Clinical Social Work Discharge Planning Services: CM Consult   Living arrangements for the past 2 months: Single Family Home                                       Social Determinants of Health (SDOH) Interventions    Readmission Risk Interventions    03/03/2022   12:35 PM  Readmission Risk Prevention Plan  Transportation Screening Complete  Home Care Screening Complete  Medication Review (RN CM) Complete

## 2022-03-06 ENCOUNTER — Encounter (HOSPITAL_COMMUNITY): Payer: Self-pay | Admitting: Family Medicine

## 2022-03-06 ENCOUNTER — Inpatient Hospital Stay (HOSPITAL_COMMUNITY): Payer: No Typology Code available for payment source

## 2022-03-06 ENCOUNTER — Other Ambulatory Visit: Payer: Self-pay

## 2022-03-06 ENCOUNTER — Encounter (HOSPITAL_COMMUNITY)
Admission: EM | Disposition: A | Discharge status: Skilled Nursing Facility | Payer: Self-pay | Source: Home / Self Care | Attending: Internal Medicine

## 2022-03-06 DIAGNOSIS — I5031 Acute diastolic (congestive) heart failure: Secondary | ICD-10-CM | POA: Diagnosis not present

## 2022-03-06 DIAGNOSIS — I509 Heart failure, unspecified: Secondary | ICD-10-CM

## 2022-03-06 DIAGNOSIS — N179 Acute kidney failure, unspecified: Secondary | ICD-10-CM | POA: Diagnosis not present

## 2022-03-06 DIAGNOSIS — F17210 Nicotine dependence, cigarettes, uncomplicated: Secondary | ICD-10-CM

## 2022-03-06 DIAGNOSIS — N186 End stage renal disease: Secondary | ICD-10-CM | POA: Diagnosis not present

## 2022-03-06 DIAGNOSIS — N184 Chronic kidney disease, stage 4 (severe): Secondary | ICD-10-CM | POA: Insufficient documentation

## 2022-03-06 DIAGNOSIS — I2510 Atherosclerotic heart disease of native coronary artery without angina pectoris: Secondary | ICD-10-CM

## 2022-03-06 DIAGNOSIS — I132 Hypertensive heart and chronic kidney disease with heart failure and with stage 5 chronic kidney disease, or end stage renal disease: Secondary | ICD-10-CM

## 2022-03-06 HISTORY — PX: INSERTION OF DIALYSIS CATHETER: SHX1324

## 2022-03-06 LAB — HEPATITIS B SURFACE ANTIBODY, QUANTITATIVE: Hep B S AB Quant (Post): 249.7 m[IU]/mL (ref >9.9)

## 2022-03-06 SURGERY — INSERTION OF DIALYSIS CATHETER
Anesthesia: General | Site: Neck | Laterality: Right

## 2022-03-06 MED ORDER — CHLORHEXIDINE GLUCONATE 0.12 % MT SOLN
15.0000 mL | Freq: Once | OROMUCOSAL | Status: AC
  Administered 2022-03-06: 15 mL via OROMUCOSAL

## 2022-03-06 MED ORDER — LIDOCAINE HCL (PF) 1 % IJ SOLN: 5.0000 mL | INTRAMUSCULAR | Status: DC | PRN

## 2022-03-06 MED ORDER — PROPOFOL 10 MG/ML IV BOLUS
INTRAVENOUS | Status: DC | PRN
  Administered 2022-03-06: 30 mg via INTRAVENOUS

## 2022-03-06 MED ORDER — LIDOCAINE HCL (PF) 1 % IJ SOLN
INTRAMUSCULAR | Status: AC
  Filled 2022-03-06: qty 60

## 2022-03-06 MED ORDER — FENTANYL CITRATE (PF) 100 MCG/2ML IJ SOLN
INTRAMUSCULAR | Status: DC | PRN
  Administered 2022-03-06 (×2): 25 ug via INTRAVENOUS

## 2022-03-06 MED ORDER — MIDAZOLAM HCL 2 MG/2ML IJ SOLN
INTRAMUSCULAR | Status: AC
  Filled 2022-03-06: qty 2

## 2022-03-06 MED ORDER — HYDROMORPHONE HCL 1 MG/ML IJ SOLN: 0.2500 mg | INTRAMUSCULAR | Status: DC | PRN

## 2022-03-06 MED ORDER — PENTAFLUOROPROP-TETRAFLUOROETH EX AERO: 1.0000 | INHALATION_SPRAY | CUTANEOUS | Status: DC | PRN

## 2022-03-06 MED ORDER — ORAL CARE MOUTH RINSE: 15.0000 mL | Freq: Once | OROMUCOSAL | Status: AC

## 2022-03-06 MED ORDER — SODIUM CHLORIDE 0.9 % IV SOLN: INTRAVENOUS | Status: DC

## 2022-03-06 MED ORDER — HEPARIN SODIUM (PORCINE) 1000 UNIT/ML IJ SOLN
INTRAMUSCULAR | Status: AC
  Filled 2022-03-06: qty 4

## 2022-03-06 MED ORDER — ALTEPLASE 2 MG IJ SOLR: 2.0000 mg | Freq: Once | INTRAMUSCULAR | Status: DC | PRN

## 2022-03-06 MED ORDER — LIDOCAINE-PRILOCAINE 2.5-2.5 % EX CREA: 1.0000 | TOPICAL_CREAM | CUTANEOUS | Status: DC | PRN

## 2022-03-06 MED ORDER — PROPOFOL 500 MG/50ML IV EMUL
INTRAVENOUS | Status: DC | PRN
  Administered 2022-03-06: 25 ug/kg/min via INTRAVENOUS

## 2022-03-06 MED ORDER — MIDAZOLAM HCL 5 MG/5ML IJ SOLN
INTRAMUSCULAR | Status: DC | PRN
  Administered 2022-03-06: .5 mg via INTRAVENOUS

## 2022-03-06 MED ORDER — HEPARIN SODIUM (PORCINE) 1000 UNIT/ML IJ SOLN
INTRAMUSCULAR | Status: DC | PRN
  Administered 2022-03-06: 4000 [IU]

## 2022-03-06 MED ORDER — LIDOCAINE HCL (PF) 1 % IJ SOLN
INTRAMUSCULAR | Status: DC | PRN
  Administered 2022-03-06: 10 mL

## 2022-03-06 MED ORDER — PROPOFOL 10 MG/ML IV BOLUS
INTRAVENOUS | Status: AC
  Filled 2022-03-06: qty 20

## 2022-03-06 MED ORDER — ANTICOAGULANT SODIUM CITRATE 4% (200MG/5ML) IV SOLN: 5.0000 mL | Status: DC | PRN

## 2022-03-06 MED ORDER — HEPARIN SODIUM (PORCINE) 1000 UNIT/ML DIALYSIS
1000.0000 [IU] | INTRAMUSCULAR | Status: DC | PRN
  Administered 2022-03-06: 3800 [IU]

## 2022-03-06 MED ORDER — FENTANYL CITRATE (PF) 100 MCG/2ML IJ SOLN
INTRAMUSCULAR | Status: AC
  Filled 2022-03-06: qty 2

## 2022-03-06 MED ORDER — SODIUM CHLORIDE (PF) 0.9 % IJ SOLN
INTRAMUSCULAR | Status: DC | PRN
  Administered 2022-03-06: 250 mL

## 2022-03-06 SURGICAL SUPPLY — 40 items
APPLICATOR CHLORAPREP 10.5 ORG (MISCELLANEOUS) ×1 IMPLANT
BAG DECANTER FOR FLEXI CONT (MISCELLANEOUS) ×1 IMPLANT
BIOPATCH RED 1 DISK 7.0 (GAUZE/BANDAGES/DRESSINGS) ×1 IMPLANT
CATH PALINDROME-P 23CM W/VT (CATHETERS) IMPLANT
COVER LIGHT HANDLE STERIS (MISCELLANEOUS) ×2 IMPLANT
COVER PROBE U/S 5X48 (MISCELLANEOUS) ×1 IMPLANT
DECANTER SPIKE VIAL GLASS SM (MISCELLANEOUS) ×2 IMPLANT
DERMABOND ADVANCED .7 DNX12 (GAUZE/BANDAGES/DRESSINGS) ×1 IMPLANT
DRAPE C-ARM FOLDED MOBILE STRL (DRAPES) ×1 IMPLANT
DRAPE CHEST BREAST 15X10 FENES (DRAPES) ×1 IMPLANT
DRSG SORBAVIEW 3.5X5-5/16 MED (GAUZE/BANDAGES/DRESSINGS) ×1 IMPLANT
ELECT REM PT RETURN 9FT ADLT (ELECTROSURGICAL) ×1
ELECTRODE REM PT RTRN 9FT ADLT (ELECTROSURGICAL) ×1 IMPLANT
GAUZE 4X4 16PLY ~~LOC~~+RFID DBL (SPONGE) ×1 IMPLANT
GEL ULTRASOUND 20GR AQUASONIC (MISCELLANEOUS) ×1 IMPLANT
GLOVE BIO SURGEON STRL SZ 6.5 (GLOVE) ×1 IMPLANT
GLOVE BIOGEL PI IND STRL 6.5 (GLOVE) ×1 IMPLANT
GLOVE BIOGEL PI IND STRL 7.0 (GLOVE) ×2 IMPLANT
GOWN STRL REUS W/TWL LRG LVL3 (GOWN DISPOSABLE) ×2 IMPLANT
IV NS 500ML (IV SOLUTION) ×1
IV NS 500ML BAXH (IV SOLUTION) ×1 IMPLANT
KIT BLADEGUARD II DBL (SET/KITS/TRAYS/PACK) ×1 IMPLANT
KIT TURNOVER KIT A (KITS) ×1 IMPLANT
MARKER SKIN DUAL TIP RULER LAB (MISCELLANEOUS) ×1 IMPLANT
NDL HYPO 18GX1.5 BLUNT FILL (NEEDLE) ×1 IMPLANT
NDL HYPO 25X1 1.5 SAFETY (NEEDLE) ×1 IMPLANT
NEEDLE HYPO 18GX1.5 BLUNT FILL (NEEDLE) ×1 IMPLANT
NEEDLE HYPO 25X1 1.5 SAFETY (NEEDLE) ×1 IMPLANT
PACK BASIC III (CUSTOM PROCEDURE TRAY) ×1
PACK SRG BSC III STRL LF ECLPS (CUSTOM PROCEDURE TRAY) ×1 IMPLANT
PAD ARMBOARD 7.5X6 YLW CONV (MISCELLANEOUS) ×1 IMPLANT
PENCIL SMOKE EVACUATOR COATED (MISCELLANEOUS) ×1 IMPLANT
SET BASIN LINEN APH (SET/KITS/TRAYS/PACK) ×1 IMPLANT
SUT MNCRL AB 4-0 PS2 18 (SUTURE) ×1 IMPLANT
SUT SILK 2 0 FSL 18 (SUTURE) ×1 IMPLANT
SUT VIC AB 3-0 SH 27 (SUTURE) ×1
SUT VIC AB 3-0 SH 27X BRD (SUTURE) ×1 IMPLANT
SYR 10ML LL (SYRINGE) ×2 IMPLANT
SYR 5ML LL (SYRINGE) IMPLANT
SYR CONTROL 10ML LL (SYRINGE) ×1 IMPLANT

## 2022-03-06 NOTE — Progress Notes (Signed)
Patient has rested relatively well. Patient has had a bowel movements and had prep for surgery.  Patient c/o chronic knee pain.  No new issues with patient.

## 2022-03-06 NOTE — Anesthesia Postprocedure Evaluation (Signed)
Anesthesia Post Note  Patient: RENTON BERKLEY  Procedure(s) Performed: INSERTION OF DIALYSIS CATHETER (Right: Neck)  Patient location during evaluation: Phase II Anesthesia Type: General Level of consciousness: awake and alert and oriented Pain management: pain level controlled Vital Signs Assessment: post-procedure vital signs reviewed and stable Respiratory status: spontaneous breathing, nonlabored ventilation and respiratory function stable Cardiovascular status: blood pressure returned to baseline and stable Postop Assessment: no apparent nausea or vomiting Anesthetic complications: no   No notable events documented.   Last Vitals:  Vitals:   03/06/22 1330 03/06/22 1345  BP: 138/70 134/74  Pulse: 70 69  Resp: 12 12  Temp: 36.7 C   SpO2: 98% 97%    Last Pain:  Vitals:   03/06/22 1330  TempSrc:   PainSc: 7                  Shavona Gunderman C Jannelly Bergren

## 2022-03-06 NOTE — Progress Notes (Signed)
Patient ID: Matthew [name], male   DOB: Jul 05, 1945, 76 y.o.   MRN: [id number] S:No events overnight.  He is still amenable to proceed with HD catheter placement and initiation of HD afterwards. O:BP 130/63   Pulse 75   Temp 98.2 F (36.8 C)   Resp 18   Ht 5\' 8"  (1.727 m)   Wt 80.5 kg   SpO2 94%   BMI 26.98 kg/m   Intake/Output Summary (Last 24 hours) at 03/06/2022 0851 Last data filed at 03/06/2022 0500 Gross per 24 hour  Intake 480 ml  Output 901 ml  Net -421 ml   Intake/Output: I/O last 3 completed shifts: In: 480 [P.O.:480] Out: 1601 [Urine:1600; Stool:1]  Intake/Output this shift:  No intake/output data recorded. Weight change: 0 kg HEN:IDPOE, elderly man in NAD CVS: RRR Resp:CTA Abd: +BS, soft, NT/nD Ext: no edema  Recent Labs  Lab 02/28/22 0032 03/01/22 0536 03/02/22 0353 03/03/22 0531 03/04/22 0348 03/05/22 0451 03/05/22 1001  NA 141 141 139 139 136 134* 133*  K 4.6 4.5 4.7 4.0 4.2 4.0 4.1  CL 114* 113* 108 105 103 101 101  CO2 18* 20* 18* 20* 19* 19* 18*  GLUCOSE 95 81 93 128* 110* 109* 137*  BUN 52* 52* 52* 60* 75* 93* 96*  CREATININE 5.89* 5.13* 5.18* 6.07* 6.50* 7.12* 7.35*  ALBUMIN 3.3*  --  3.4* 3.3* 3.0* 2.8* 2.7*  CALCIUM 8.0* 8.3* 8.8* 8.6* 8.7* 8.4* 8.2*  PHOS  --   --  5.4* 6.6* 6.9* 8.0* 8.3*  AST 15  --   --   --   --   --   --   ALT 15  --   --   --   --   --   --    Liver Function Tests: Recent Labs  Lab 02/28/22 0032 03/02/22 0353 03/04/22 0348 03/05/22 0451 03/05/22 1001  AST 15  --   --   --   --   ALT 15  --   --   --   --   ALKPHOS 61  --   --   --   --   BILITOT 0.4  --   --   --   --   PROT 6.9  --   --   --   --   ALBUMIN 3.3*   < > 3.0* 2.8* 2.7*   < > = values in this interval not displayed.   No results for input(s): "LIPASE", "AMYLASE" in the last 168 hours. No results for input(s): "AMMONIA" in the last 168 hours. CBC: Recent Labs  Lab 02/28/22 0032 03/01/22 0536 03/02/22 0353 03/05/22 0451  WBC 8.7 4.8  8.0 10.8*  NEUTROABS 6.2 2.6  --   --   HGB 8.4* 7.8* 9.3* 8.2*  HCT 26.3* 24.3* 29.2* 25.5*  MCV 96.0 96.0 94.8 95.1  PLT 198 183 215 227   Cardiac Enzymes: No results for input(s): "CKTOTAL", "CKMB", "CKMBINDEX", "TROPONINI" in the last 168 hours. CBG: No results for input(s): "GLUCAP" in the last 168 hours.  Iron Studies: No results for input(s): "IRON", "TIBC", "TRANSFERRIN", "FERRITIN" in the last 72 hours. Studies/Results: No results found.  amLODipine  10 mg Oral q morning   aspirin EC  81 mg Oral Daily   atorvastatin  80 mg Oral q1800   carvedilol  25 mg Oral BID   Chlorhexidine Gluconate Cloth  6 each Topical Q0600   Chlorhexidine Gluconate Cloth  6 each Topical Once  darbepoetin (ARANESP) injection - NON-DIALYSIS  100 mcg Subcutaneous Q Mon-1800   diclofenac Sodium  2 g Topical QID   famotidine  10 mg Oral Daily   febuxostat  40 mg Oral Daily   furosemide  40 mg Oral BID   sodium chloride flush  3 mL Intravenous Q12H    BMET    Component Value Date/Time   NA 133 (L) 03/05/2022 1001   K 4.1 03/05/2022 1001   CL 101 03/05/2022 1001   CO2 18 (L) 03/05/2022 1001   GLUCOSE 137 (H) 03/05/2022 1001   BUN 96 (H) 03/05/2022 1001   CREATININE 7.35 (H) 03/05/2022 1001   CALCIUM 8.2 (L) 03/05/2022 1001   GFRNONAA 7 (L) 03/05/2022 1001   GFRAA 34 (L) 10/07/2018 0651   CBC    Component Value Date/Time   WBC 10.8 (H) 03/05/2022 0451   RBC 2.68 (L) 03/05/2022 0451   HGB 8.2 (L) 03/05/2022 0451   HCT 25.5 (L) 03/05/2022 0451   PLT 227 03/05/2022 0451   MCV 95.1 03/05/2022 0451   MCH 30.6 03/05/2022 0451   MCHC 32.2 03/05/2022 0451   RDW 13.0 03/05/2022 0451   LYMPHSABS 1.2 03/01/2022 0536   MONOABS 0.7 03/01/2022 0536   EOSABS 0.3 03/01/2022 0536   BASOSABS 0.0 03/01/2022 0536      Assessment/Plan:   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  He is a marginal candidate for dialysis given  his poor functional and nutritional status, however he wants dialysis instead of hospice.  Have consulted Dr. Constance Haw who has kindly agreed to place [facility] today.  Will start slow and short serial dialysis for the next 3 days.  Will need CM/SW assistance with placement for outpatient HD at the [facility] HD unit before he can be discharged. Acute on chronic CHF - improved with diuresis. 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 Moderate protein malnutrition - will need protein supplementation Deconditioning - PT recommending SNF.  SW involved.  Disposition - will need outpatient HD arrangements in [place] prior to discharge.   Donetta Potts, MD [facility]

## 2022-03-06 NOTE — Progress Notes (Signed)
Pt bathed and transported to dialysis.

## 2022-03-06 NOTE — Progress Notes (Signed)
Palliative: Chart review completed.  Attempted to see Mr. Fellman he was off the floor.  At this point the plan is for HD catheter placement and initiation of HD while in the hospital.  Transition of care team is working diligently for HD chair and short-term rehab.  Plan: Continue full scope/full code.  HD cath with initiation of hemodialysis.  Short-term rehab with ultimate goal of returning home to [facility], where he has been since June/July of this year.    No charge Quinn Axe, NP Palliative medicine team Team phone [phone] Greater than 50% of this time was spent counseling and coordinating care related to the above assessment and plan.

## 2022-03-06 NOTE — Transfer of Care (Signed)
Immediate Anesthesia Transfer of Care Note  [name]  Procedure(s) Performed: INSERTION OF DIALYSIS CATHETER (Right: Neck)  Patient Location: PACU  Anesthesia Type:MAC  Level of Consciousness: awake, alert , oriented, and patient cooperative  Airway & Oxygen Therapy: Patient Spontanous Breathing  Post-op Assessment: Report given to RN, Post -op Vital signs reviewed and stable, and Patient moving all extremities X 4  Post vital signs: Reviewed and stable  Last Vitals:  Vitals Value Taken Time  BP 138/70 03/06/22 1330  Temp    Pulse 69 03/06/22 1330  Resp 16 03/06/22 1330  SpO2 98 % 03/06/22 1330  Vitals shown include unvalidated device data.  Last Pain:  Vitals:   03/06/22 1101  TempSrc: Oral  PainSc: 7       Patients Stated Pain Goal: 2 (72/53/66 4403)  Complications: No notable events documented.

## 2022-03-06 NOTE — Op Note (Signed)
Operative Note 03/06/22   Preoperative Diagnosis: End Stage Renal Disease    Postoperative Diagnosis: Same   Procedure(s) Performed: Tunneled Dialysis Catheter Placement, Right Internal Jugular    Surgeon: Lanell Matar. Constance Haw, MD   Assistants: No qualified resident was available   Anesthesia: Monitored anesthesia care   Anesthesiologist: Denese Killings, MD    Specimens: None   Estimated Blood Loss: Minimal   Fluoroscopy time: 8 seconds   Blood Replacement: None    Complications: None    Operative Findings: Normal anatomy   Indications: Mr. Matthew [name] is a 76 yo with worsening renal failure that is going to start dialysis. We discussed placement of a tunneled line and risk of bleeding, infection, injury to vessels, malfunction, pneumothorax. He opted to proceed.  Procedure: The patient was brought into the operating room and monitored anesthesia care was induced.   The right chest and neck was prepped and draped in the usual sterile fashion.  Preoperative antibiotics were given.   An Ultrasound was used to verify that the right internal jugular vein was patent.  One percent lidocaine was used for local anesthesia.  The patient was measured and a 23 cm Palindrome dual lumen dialysis catheter.  The needles advanced into the right internal jugular vein using the Seldinger technique without difficulty.  A guidewire was then advanced into the right atrium under fluoroscopic guidance.  Ectopia was not noted.  The wire was secured.  An incision was made over the right chest and the catheter was tunneled to the neck.  The ultrasound again confirmed the wire was going into the vein only. Dilators were used over the wire to dilate the track.  An introducer and peel-away sheath were placed over the guidewire. The catheter was then inserted through the peel-away sheath and the peel-away sheath was removed.  A spot film was performed to confirm the position.  The catheter drew back and flushed  easily. The lumens were packed with heparin. Hemostats were used to position the catheter in the neck incision. The neck incision was closed with 4-0 Monocryl and Dermabond. The catheter was secured with 2-0 silk suture and a sterile Biopatch and dressing was applied.  Hemostasis was confirmed.     All tape and needle counts were correct at the end of the procedure. The patient was transferred to PACU in stable condition. A chest x-ray will be performed at that time.  Curlene Labrum, MD [facility] [address] [place], [place] [address] [phone] (office)

## 2022-03-06 NOTE — Progress Notes (Signed)
[facility]  Catheter in place. CXR ordered. Updated [name], she will be coming to see him this afternoon.   Curlene Labrum, MD [facility] [address] [place], [place] [address] [phone] (office)

## 2022-03-06 NOTE — Progress Notes (Signed)
PROGRESS NOTE  [name] ZLD:[id number] DOB: 01-22-1946 DOA: 02/28/2022 PCP: Charlsie Merles, MD  Brief History:  Matthew [name] is a 76 y.o. male with medical history significant of hypertension, hyperlipidemia, history of coronary artery disease status post CABG, status post AVR, chronic kidney disease stage IV, peripheral arterial disease and GERD; who presented to the emergency department secondary to shortness of breath, chest pressure and dyspnea exertion. Patient denies any focal deficits, coughing spells, hematuria, dysuria, melena, hematochezia, fever/chills or any other complaints.   Work-up demonstrating elevated BNP, hypoxia (new to patient) and significant worsening in his renal function.  TRH has been contacted to place patient in the hospital for further evaluation and management.  Nephrology and cardiology service consulted.  He was started in IV lasix with symptomatic improvement.  Unfortunately, his serum creatinine continued to worsen.  After discussions with palliative medicine and the medical team, patient decided to pursue HD.  General surgery was consulted and plans for tunneled HD catheter on 03/06/22.  Assessment/Plan: Acute respiratory failure with hypoxia -due to pulmonary edema and fluid overload -improving with diuresis -weaned to RA   Acute HFpEF -02/28/22 Echo--EF 65-70%, no WMA, normal RVF, bioprosthetic AVR -clinically improving on IV lasix -Diuretics transitioned to oral route 10/31 -daily weight and strict I's and O's -Patient creatinine once diuresis has been initiated has trended up some (creatinine 7.35)   acute kidney injury on chronic renal failure stage IV -baseline creatinine 3.3-3.5 -presented with serum creatinine 5.89 -appreciate nephrology followup and recommendations -Continue strict I's and O's and daily weight -No signs of UTI -Renal ultrasound demonstrating chronic medical renal disease without hydronephrosis or  obstruction. -After IV diuresis provided creatinine continues to trend up -Palliative care consulted and after discussion with daughter and patient they are interested in full scope of practice and wish to pursue HD -general surgery consulted>>tunneled HD catheter 11/2 -03/06/22>>first HD session   hypertension -Stable -Continue amlodipine and Coreg--holding for HD -Also receiving IV diuresis>>now on HD -As needed hydralazine has been ordered for systolic blood pressure above 903 and/or diastolic blood pressure more than 110. -anticipate improving BP with HD   anemia of chronic kidney disease -Given history of coronary disease plan is for transfusion if hemoglobin less than 7.5 -Fecal occult blood test at time of admission was positive; stopping heparin products and using SCDs for DVT prophylaxis -Continue monitoring hemoglobin trend -IV iron and Epogen as per nephrology discretion. -Last hemoglobin level 9.3 -No signs of overt bleeding appreciated. -Hgb has remained stable for the hospitalization   coronary artery disease -no chest pain -Continue aspirin and statin -Patient denies chest pain. -2D echo--EF 65-70%, no WMA, normal RVF   hyperlipidemia -Continue statin -Heart healthy diet discussed with patient.   gout -No acute flare currently -Continue Uloric   GERD -Continue Pepcid   stage II pressure injury -Localizing his coccyx; no signs of superimposed infection -Present at time of admission -Continue constant repositioning and preventive measures.      Family Communication:   daughter updated 11/1   Consultants:  renal, general surgery, palliative   Code Status:  FULL   DVT Prophylaxis:  SCDs     Procedures: As Listed in Progress Note Above   Antibiotics: None         Subjective: Patient denies fevers, chills, headache, chest pain, dyspnea, nausea, vomiting, diarrhea, abdominal pain, dysuria, hematuria, hematochezia, and  melena.   Objective: Vitals:   03/06/22 1645  03/06/22 1700 03/06/22 1715 03/06/22 1730  BP: 120/76 139/73 134/77 129/81  Pulse: 71 74 72 70  Resp: 16 16 16 17   Temp:      TempSrc:      SpO2:      Weight:      Height:        Intake/Output Summary (Last 24 hours) at 03/06/2022 1756 Last data filed at 03/06/2022 1600 Gross per 24 hour  Intake 250 ml  Output 1206 ml  Net -956 ml   Weight change: 0 kg Exam:  General:  Pt is alert, follows commands appropriately, not in acute distress HEENT: No icterus, No thrush, No neck mass, [place]/AT Cardiovascular: RRR, S1/S2, no rubs, no gallops Respiratory: CTA bilaterally, no wheezing, no crackles, no rhonchi Abdomen: Soft/+BS, non tender, non distended, no guarding Extremities: trace LLE edema, No lymphangitis, No petechiae, No rashes, no synovitis   Data Reviewed: I have personally reviewed following labs and imaging studies Basic Metabolic Panel: Recent Labs  Lab 03/01/22 0536 03/02/22 0353 03/03/22 0531 03/04/22 0348 03/05/22 0451 03/05/22 1001  NA 141 139 139 136 134* 133*  K 4.5 4.7 4.0 4.2 4.0 4.1  CL 113* 108 105 103 101 101  CO2 20* 18* 20* 19* 19* 18*  GLUCOSE 81 93 128* 110* 109* 137*  BUN 52* 52* 60* 75* 93* 96*  CREATININE 5.13* 5.18* 6.07* 6.50* 7.12* 7.35*  CALCIUM 8.3* 8.8* 8.6* 8.7* 8.4* 8.2*  MG 1.9  --   --   --   --   --   PHOS  --  5.4* 6.6* 6.9* 8.0* 8.3*   Liver Function Tests: Recent Labs  Lab 02/28/22 0032 03/02/22 0353 03/03/22 0531 03/04/22 0348 03/05/22 0451 03/05/22 1001  AST 15  --   --   --   --   --   ALT 15  --   --   --   --   --   ALKPHOS 61  --   --   --   --   --   BILITOT 0.4  --   --   --   --   --   PROT 6.9  --   --   --   --   --   ALBUMIN 3.3* 3.4* 3.3* 3.0* 2.8* 2.7*   No results for input(s): "LIPASE", "AMYLASE" in the last 168 hours. No results for input(s): "AMMONIA" in the last 168 hours. Coagulation Profile: No results for input(s): "INR", "PROTIME" in the last 168  hours. CBC: Recent Labs  Lab 02/28/22 0032 03/01/22 0536 03/02/22 0353 03/05/22 0451  WBC 8.7 4.8 8.0 10.8*  NEUTROABS 6.2 2.6  --   --   HGB 8.4* 7.8* 9.3* 8.2*  HCT 26.3* 24.3* 29.2* 25.5*  MCV 96.0 96.0 94.8 95.1  PLT 198 183 215 227   Cardiac Enzymes: No results for input(s): "CKTOTAL", "CKMB", "CKMBINDEX", "TROPONINI" in the last 168 hours. BNP: Invalid input(s): "POCBNP" CBG: No results for input(s): "GLUCAP" in the last 168 hours. HbA1C: No results for input(s): "HGBA1C" in the last 72 hours. Urine analysis:    Component Value Date/Time   COLORURINE YELLOW 02/28/2022 0112   APPEARANCEUR CLEAR 02/28/2022 0112   LABSPEC 1.013 02/28/2022 0112   PHURINE 5.0 02/28/2022 0112   GLUCOSEU NEGATIVE 02/28/2022 0112   HGBUR NEGATIVE 02/28/2022 0112   BILIRUBINUR NEGATIVE 02/28/2022 0112   KETONESUR NEGATIVE 02/28/2022 0112   PROTEINUR >=300 (A) 02/28/2022 0112   NITRITE NEGATIVE 02/28/2022 0112   LEUKOCYTESUR  SMALL (A) 02/28/2022 0112   Sepsis Labs: @LABRCNTIP (procalcitonin:4,lacticidven:4) ) Recent Results (from the past 240 hour(s))  Resp Panel by RT-PCR (Flu A&B, Covid) Anterior Nasal Swab     Status: None   Collection Time: 02/28/22 12:25 AM   Specimen: Anterior Nasal Swab  Result Value Ref Range Status   SARS Coronavirus 2 by RT PCR NEGATIVE NEGATIVE Final    Comment: (NOTE) SARS-CoV-2 target nucleic acids are NOT DETECTED.  The SARS-CoV-2 RNA is generally detectable in upper respiratory specimens during the acute phase of infection. The lowest concentration of SARS-CoV-2 viral copies this assay can detect is 138 copies/mL. A negative result does not preclude SARS-Cov-2 infection and should not be used as the sole basis for treatment or other patient management decisions. A negative result may occur with  improper specimen collection/handling, submission of specimen other than nasopharyngeal swab, presence of viral mutation(s) within the areas targeted by  this assay, and inadequate number of viral copies(<138 copies/mL). A negative result must be combined with clinical observations, patient history, and epidemiological information. The expected result is Negative.  Fact Sheet for Patients:  EntrepreneurPulse.com.au  Fact Sheet for Healthcare Providers:  IncredibleEmployment.be  This test is no t yet approved or cleared by the [place] FDA and  has been authorized for detection and/or diagnosis of SARS-CoV-2 by FDA under an Emergency Use Authorization (EUA). This EUA will remain  in effect (meaning this test can be used) for the duration of the COVID-19 declaration under Section 564(b)(1) of the Act, 21 U.S.C.section 360bbb-3(b)(1), unless the authorization is terminated  or revoked sooner.       Influenza A by PCR NEGATIVE NEGATIVE Final   Influenza B by PCR NEGATIVE NEGATIVE Final    Comment: (NOTE) The Xpert Xpress SARS-CoV-2/FLU/RSV plus assay is intended as an aid in the diagnosis of influenza from Nasopharyngeal swab specimens and should not be used as a sole basis for treatment. Nasal washings and aspirates are unacceptable for Xpert Xpress SARS-CoV-2/FLU/RSV testing.  Fact Sheet for Patients: EntrepreneurPulse.com.au  Fact Sheet for Healthcare Providers: IncredibleEmployment.be  This test is not yet approved or cleared by the [place] FDA and has been authorized for detection and/or diagnosis of SARS-CoV-2 by FDA under an Emergency Use Authorization (EUA). This EUA will remain in effect (meaning this test can be used) for the duration of the COVID-19 declaration under Section 564(b)(1) of the Act, 21 U.S.C. section 360bbb-3(b)(1), unless the authorization is terminated or revoked.  Performed at [facility], [address], [place], [place] [address]   Urine Culture     Status: None   Collection Time: 02/28/22  1:12 AM   Specimen:  Urine, Clean Catch  Result Value Ref Range Status   Specimen Description   Final    URINE, CLEAN CATCH Performed at [facility], [address], [name], [place] [address]    Special Requests   Final    NONE Performed at [facility], [address] [place], [place] [address]    Culture   Final    NO GROWTH Performed at [facility], [place] [address] [place], [place] [address]    Report Status 03/01/2022 FINAL  Final  MRSA Next Gen by PCR, Nasal     Status: None   Collection Time: 02/28/22 12:01 PM   Specimen: Nasal Mucosa; Nasal Swab  Result Value Ref Range Status   MRSA by PCR Next Gen NOT DETECTED NOT DETECTED Final    Comment: (NOTE) The GeneXpert MRSA  Assay (FDA approved for NASAL specimens only), is one component of a comprehensive MRSA colonization surveillance program. It is not intended to diagnose MRSA infection nor to guide or monitor treatment for MRSA infections. Test performance is not FDA approved in patients less than 15 years old. Performed at [facility], [address], [place], [place] [address]   Surgical pcr screen     Status: None   Collection Time: 03/05/22  3:46 PM   Specimen: Nasal Mucosa; Nasal Swab  Result Value Ref Range Status   MRSA, PCR NEGATIVE NEGATIVE Final   Staphylococcus aureus NEGATIVE NEGATIVE Final    Comment: (NOTE) The Xpert SA Assay (FDA approved for NASAL specimens in patients 62 years of age and older), is one component of a comprehensive surveillance program. It is not intended to diagnose infection nor to guide or monitor treatment. Performed at [facility], [address], [place], [place] [address]      Scheduled Meds:  amLODipine  10 mg Oral q morning   aspirin EC  81 mg Oral Daily   atorvastatin  80 mg Oral q1800   carvedilol  25 mg Oral BID   Chlorhexidine Gluconate Cloth  6 each Topical Q0600   Chlorhexidine Gluconate Cloth  6 each Topical Once   darbepoetin (ARANESP) injection - NON-DIALYSIS  100 mcg  Subcutaneous Q Mon-1800   diclofenac Sodium  2 g Topical QID   famotidine  10 mg Oral Daily   febuxostat  40 mg Oral Daily   furosemide  40 mg Oral BID   sodium chloride flush  3 mL Intravenous Q12H   Continuous Infusions:  sodium chloride 10 mL/hr at 03/06/22 1241   anticoagulant sodium citrate      Procedures/Studies: DG Chest Port 1 View  Result Date: 03/06/2022 CLINICAL DATA:  Placement of dialysis catheter EXAM: PORTABLE CHEST 1 VIEW COMPARISON:  Previous studies including the examination done on 02/28/2022 FINDINGS: Transverse diameter of heart is slightly increased. There are no signs of pulmonary edema or focal pulmonary consolidation. 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 There is no pleural effusion or pneumothorax. There is interval placement of right IJ central venous catheter with its tip in the right atrium. IMPRESSION: Tip of right IJ central venous catheter is seen at the junction of superior vena cava and right atrium. There are no signs of pulmonary edema or focal pulmonary consolidation. There is no pleural effusion or pneumothorax. Electronically Signed   By: Elmer Picker M.D.   On: 03/06/2022 13:41   DG C-Arm 1-60 Min-No Report  Result Date: 03/06/2022 Fluoroscopy was utilized by the requesting physician.  No radiographic interpretation.   US RENAL  Result Date: 02/28/2022 CLINICAL DATA:  Acute renal insufficiency EXAM: RENAL / URINARY TRACT ULTRASOUND COMPLETE COMPARISON:  03/09/2021, 06/25/2021 FINDINGS: Right Kidney: Renal measurements: 11.2 x 5.0 x 5.9 cm = volume: 172.5 mL. Increased renal cortical echotexture. There is a benign simple cyst measuring 1.5 cm off the ventral mid aspect of the kidney. No specific imaging follow-up is required. No hydronephrosis or nephrolithiasis. Left Kidney: Renal measurements: 13.8 x 7.7 by 7.9 cm = volume: 440.2 mL. There is left renal cortical thinning, with increased renal cortical echotexture. Multiple cysts are again  identified, largest measuring up to 6.4 cm. No hydronephrosis or nephrolithiasis. Bladder: Appears normal for degree of bladder distention. Other: None. IMPRESSION: 1. Bilateral increased renal cortical echotexture, left renal cortical thinning, consistent with medical renal disease. 2. Bilateral renal cortical cysts, unchanged since prior exam. Electronically  Signed   By: Randa Ngo M.D.   On: 02/28/2022 15:24   ECHOCARDIOGRAM COMPLETE  Result Date: 02/28/2022    ECHOCARDIOGRAM REPORT   Patient Name:   NILS THOR Date of Exam: 02/28/2022 Medical Rec #:  [id number]       Height:       68.0 in Accession #:    [id number]      Weight:       180.0 lb Date of Birth:  Feb 04, 1946       BSA:          1.954 m Patient Age:    14 years        BP:           197/83 mmHg Patient Gender: M               HR:           71 bpm. Exam Location:  [facility] Procedure: 2D Echo, Cardiac Doppler and Color Doppler Indications:    Congestive Heart Failure I50.9  History:        Patient has prior history of Echocardiogram examinations, most                 recent 10/07/2018. Prior CABG; Risk Factors:Hypertension,                 Dyslipidemia and Pre-diabetes. CKD (chronic kidney disease),                 stage III (HCC), Non-ST elevation (NSTEMI) myocardial infarction                 [facility]), s/p AVR.  Sonographer:    Alvino Chapel RCS Referring Phys: [id number] ASIA B [place]  1. Left ventricular ejection fraction, by estimation, is 65 to 70%. The left ventricle has normal function. The left ventricle has no regional wall motion abnormalities. There is moderate left ventricular hypertrophy. Left ventricular diastolic parameters are indeterminate. Elevated left atrial pressure.  2. Right ventricular systolic function is normal. The right ventricular size is normal.  3. Left atrial size was mildly dilated.  4. Right atrial size was mildly dilated.  5. The mitral valve is abnormal. Mild mitral valve regurgitation. No  evidence of mitral stenosis.  6. A 50mm Edwards INSPIRIS RESILIA PERICARDIAL VALVE bioprosthesis valve     is present in the aortic position. The aortic valve has been repaired/replaced. Aortic valve regurgitation is not visualized. No aortic stenosis is present.  7. The inferior vena cava is normal in size with greater than 50% respiratory variability, suggesting right atrial pressure of 3 mmHg. FINDINGS  Left Ventricle: Left ventricular ejection fraction, by estimation, is 65 to 70%. The left ventricle has normal function. The left ventricle has no regional wall motion abnormalities. The left ventricular internal cavity size was normal in size. There is  moderate left ventricular hypertrophy. Left ventricular diastolic parameters are indeterminate. Elevated left atrial pressure. Right Ventricle: The right ventricular size is normal. Right vetricular wall thickness was not well visualized. Right ventricular systolic function is normal. Left Atrium: Left atrial size was mildly dilated. Right Atrium: Right atrial size was mildly dilated. Pericardium: There is no evidence of pericardial effusion. Mitral Valve: The mitral valve is abnormal. There is mild thickening of the mitral valve leaflet(s). There is mild calcification of the mitral valve leaflet(s). Mild mitral annular calcification. Mild mitral valve regurgitation. No evidence of mitral valve stenosis. Tricuspid Valve: The tricuspid valve is normal in  structure. Tricuspid valve regurgitation is trivial. No evidence of tricuspid stenosis. Aortic Valve: A 20mm Edwards INSPIRIS RESILIA PERICARDIAL VALVE bioprosthesis valve is present in the aortic position. The aortic valve has been repaired/replaced. Aortic valve regurgitation is not visualized. No aortic stenosis is present. Aortic valve mean gradient measures 15.5 mmHg. Aortic valve peak gradient measures 31.8 mmHg. Aortic valve area, by VTI measures 1.24 cm. Pulmonic Valve: The pulmonic valve was not well  visualized. Pulmonic valve regurgitation is not visualized. No evidence of pulmonic stenosis. Aorta: The aortic root is normal in size and structure. Venous: The inferior vena cava is normal in size with greater than 50% respiratory variability, suggesting right atrial pressure of 3 mmHg. IAS/Shunts: No atrial level shunt detected by color flow Doppler.  LEFT VENTRICLE PLAX 2D LVIDd:         4.00 cm   Diastology LVIDs:         2.20 cm   LV e' medial:    5.87 cm/s LV PW:         1.30 cm   LV E/e' medial:  23.0 LV IVS:        1.30 cm   LV e' lateral:   8.38 cm/s LVOT diam:     1.80 cm   LV E/e' lateral: 16.1 LV SV:         78 LV SV Index:   40 LVOT Area:     2.54 cm  RIGHT VENTRICLE RV S prime:     14.50 cm/s TAPSE (M-mode): 2.0 cm LEFT ATRIUM             Index        RIGHT ATRIUM           Index LA diam:        4.00 cm 2.05 cm/m   RA Area:     22.80 cm LA Vol (A2C):   80.5 ml 41.19 ml/m  RA Volume:   76.20 ml  38.99 ml/m LA Vol (A4C):   65.5 ml 33.52 ml/m LA Biplane Vol: 77.9 ml 39.86 ml/m  AORTIC VALVE AV Area (Vmax):    1.06 cm AV Area (Vmean):   1.18 cm AV Area (VTI):     1.24 cm AV Vmax:           282.00 cm/s AV Vmean:          179.000 cm/s AV VTI:            0.634 m AV Peak Grad:      31.8 mmHg AV Mean Grad:      15.5 mmHg LVOT Vmax:         117.00 cm/s LVOT Vmean:        83.100 cm/s LVOT VTI:          0.308 m LVOT/AV VTI ratio: 0.49  AORTA Ao Root diam: 3.40 cm MITRAL VALVE MV Area (PHT): 5.27 cm     SHUNTS MV Decel Time: 144 msec     Systemic VTI:  0.31 m MV E velocity: 135.00 cm/s  Systemic Diam: 1.80 cm MV A velocity: 123.00 cm/s MV E/A ratio:  1.10 Carlyle Dolly MD Electronically signed by Carlyle Dolly MD Signature Date/Time: 02/28/2022/1:33:18 PM    Final    DG Chest 2 View  Result Date: 02/28/2022 CLINICAL DATA:  Shortness of breath. EXAM: CHEST - 2 VIEW COMPARISON:  Chest radiograph dated 08/05/2021. FINDINGS: Trace pleural effusion on the lateral view. No focal consolidation, or  pneumothorax. The cardiac  silhouette is within limits. Median sternotomy wires and mechanical cardiac valve. No acute osseous pathology. IMPRESSION: Trace pleural effusions.  No focal consolidation. Electronically Signed   By: Anner Crete M.D.   On: 02/28/2022 01:02    Orson Eva, DO  [facility]  If 7PM-7AM, please contact night-coverage [email or web address] Password [email or web address] 03/06/2022, 5:56 PM   LOS: 6 days

## 2022-03-06 NOTE — Interval H&P Note (Signed)
History and Physical Interval Note:  03/06/2022 10:16 AM  Matthew [name]  has presented today for surgery, with the diagnosis of esrd.  The various methods of treatment have been discussed with the patient and family. After consideration of risks, benefits and other options for treatment, the patient has consented to  Procedure(s): INSERTION OF DIALYSIS CATHETER (Right) as a surgical intervention.  The patient's history has been reviewed, patient examined, no change in status, stable for surgery.  I have reviewed the patient's chart and labs.  Questions were answered to the patient's satisfaction.     Virl Cagey

## 2022-03-06 NOTE — Progress Notes (Signed)
Pt returned to unit after dialysis with no distress voiced.

## 2022-03-06 NOTE — Procedures (Signed)
     INITIAL HEMODIALYSIS TREATMENT NOTE (HD#1):  Uneventful first ever HD session ( 2.5 hours) completed using newly placed RIJ TDC.  Goal met: 1 liter removed without interruption in UF.  Catheter tolerated prescribed flow with stable pressures.  Hemodynamically stable throughout entirety of treatment.  All blood was returned.  No meds ordered / given.    Post HD v/s:   t 98.6,  126/88,  p77,  r18,  spO2 100% on RA,  80.1 kg.  Transported back to 318 where bedside hand-off was given to Charma Igo, RN   Rockwell Alexandria, RN

## 2022-03-07 DIAGNOSIS — N179 Acute kidney failure, unspecified: Secondary | ICD-10-CM | POA: Diagnosis not present

## 2022-03-07 DIAGNOSIS — E782 Mixed hyperlipidemia: Secondary | ICD-10-CM | POA: Diagnosis not present

## 2022-03-07 DIAGNOSIS — I5031 Acute diastolic (congestive) heart failure: Secondary | ICD-10-CM | POA: Diagnosis not present

## 2022-03-07 DIAGNOSIS — N184 Chronic kidney disease, stage 4 (severe): Secondary | ICD-10-CM | POA: Diagnosis not present

## 2022-03-07 LAB — RENAL FUNCTION PANEL
Albumin: 3 g/dL — ABNORMAL LOW (ref 3.5–5.0)
Anion gap: 17 — ABNORMAL HIGH (ref 5–15)
BUN: 90 mg/dL — ABNORMAL HIGH (ref 8–23)
CO2: 21 mmol/L — ABNORMAL LOW (ref 22–32)
Calcium: 8.7 mg/dL — ABNORMAL LOW (ref 8.9–10.3)
Chloride: 96 mmol/L — ABNORMAL LOW (ref 98–111)
Creatinine, Ser: 6.25 mg/dL — ABNORMAL HIGH (ref 0.61–1.24)
GFR, Estimated: 9 mL/min — ABNORMAL LOW (ref >60)
Glucose, Bld: 149 mg/dL — ABNORMAL HIGH (ref 70–99)
Phosphorus: 7.5 mg/dL — ABNORMAL HIGH (ref 2.5–4.6)
Potassium: 3.8 mmol/L (ref 3.5–5.1)
Sodium: 134 mmol/L — ABNORMAL LOW (ref 135–145)

## 2022-03-07 LAB — CBC
HCT: 29.8 % — ABNORMAL LOW (ref 39.0–52.0)
Hemoglobin: 9.7 g/dL — ABNORMAL LOW (ref 13.0–17.0)
MCH: 30.4 pg (ref 26.0–34.0)
MCHC: 32.6 g/dL (ref 30.0–36.0)
MCV: 93.4 fL (ref 80.0–100.0)
Platelets: 361 10*3/uL (ref 150–400)
RBC: 3.19 MIL/uL — ABNORMAL LOW (ref 4.22–5.81)
RDW: 12.8 % (ref 11.5–15.5)
WBC: 11.9 10*3/uL — ABNORMAL HIGH (ref 4.0–10.5)
nRBC: 0 % (ref 0.0–0.2)

## 2022-03-07 LAB — HEPATITIS B CORE ANTIBODY, TOTAL: Hep B Core Total Ab: NONREACTIVE

## 2022-03-07 MED ORDER — OXYMETAZOLINE HCL 0.05 % NA SOLN
3.0000 | Freq: Two times a day (BID) | NASAL | Status: AC
  Administered 2022-03-07 – 2022-03-08 (×3): 3 via NASAL
  Filled 2022-03-07: qty 30

## 2022-03-07 MED ORDER — HEPARIN SODIUM (PORCINE) 1000 UNIT/ML IJ SOLN
INTRAMUSCULAR | Status: AC
  Filled 2022-03-07: qty 4

## 2022-03-07 NOTE — Progress Notes (Signed)
Pt called out with a nose bleed. Nose bleed lasted about 10 minutes with multiple blood clots noted. MD bedside and orders placed.

## 2022-03-07 NOTE — Progress Notes (Signed)
Responded to nursing call:  epistaxis x about 10 min   Subjective: Feels dizzy but denies cp, sob.  Vitals:   03/06/22 1928 03/07/22 0434 03/07/22 0500 03/07/22 1053  BP: (!) 134/113 138/76  136/71  Pulse: 86 73  81  Resp: 19 19    Temp: 98.6 F (37 C) 99 F (37.2 C)    TempSrc: Oral Oral    SpO2: 100% 98%  99%  Weight:   80.2 kg   Height:       CV--RRR Lung--CTA Abd--soft+BS/NT   Assessment/Plan: Epistaxis-- Apply direct nasal pressure ro right nostril Give Afrin Plan silver nitrate +/-packing if continues to bleed    Orson Eva, DO [facility]

## 2022-03-07 NOTE — Procedures (Signed)
HEMODIALYSIS TREATMENT NOTE (HD#2):  3 hour heparin-free treatment completed using right IJ TDC.  Catheter tolerated prescribed flow with stable pressures.  1L goal not met.  UF was suspended during the last 20 minutes of treatment for decreasing BP and c/o leg cramps.  Net UF 800 ml.  All blood was returned.    Had two episodes of loose stool bowel incontinence and is c/o left knee pain.  He is quite weak and cannot assist in turning.  Daughter reports he was taking Oxycodone "more than prescribed" at home.  He asked for "a pain pill" a couple of times during treatment.  Was given Oxycodone 43m IR upon returning to his room (6.5 hours since last dose).  Post HD:  142/90,  p94,  spO2 100% (room air),  79.7kg Hand-off given to [place] LPN.  ARockwell Alexandria RN

## 2022-03-07 NOTE — TOC Progression Note (Signed)
Transition of Care [facility]) - Progression Note    Patient Details  Name: Matthew [name] MRN: [id number] Date of Birth: May 25, 1945  [facility] CM/SW Contact  Shade Flood, LCSW Phone Number: 03/07/2022, 4:10 PM  Clinical Narrative:     TOC following. Javier Docker outpatient chair time still pending. Additional clinical was sent today and they are awaiting one of the Hep B lab results. [facility] can accept pt tomorrow IF chair time is assigned and Josem Kaufmann is in place.   TOC anticipating that it will likely not be possible for pt to dc until at least Monday but will follow up tomorrow.  Expected Discharge Plan: [place] Barriers to Discharge: Continued Medical Work up  Expected Discharge Plan and Services Expected Discharge Plan: [place] In-house Referral: Clinical Social Work Discharge Planning Services: CM Consult   Living arrangements for the past 2 months: Single Family Home                                       Social Determinants of Health (SDOH) Interventions    Readmission Risk Interventions    03/03/2022   12:35 PM  Readmission Risk Prevention Plan  Transportation Screening Complete  Home Care Screening Complete  Medication Review (RN CM) Complete

## 2022-03-07 NOTE — Progress Notes (Signed)
Physical Therapy Treatment Patient Details Name: Matthew [name] MRN: [id number] DOB: 1945-05-27 Today's Date: 03/07/2022   History of Present Illness Matthew [name] is a 76 y.o. male with medical history significant of hypertension, hyperlipidemia, history of coronary artery disease status post CABG, status post AVR, chronic kidney disease stage IV, peripheral arterial disease and GERD; who presented to the emergency department secondary to shortness of breath, chest pressure and dyspnea exertion.  Patient denies any focal deficits, coughing spells, hematuria, dysuria, melena, hematochezia, fever/chills or any other complaints.    PT Comments    Patient demonstrates slow labored movement for sitting up at bedside with c/o severe pain in extremities especially bilateral knees, left worse than right and right elbow.  Patient partially stood using RW, but unable to come to complete stand due to c/o severe pain and generalized weakness.  Patient required Max assist stand pivot with right knee blocked to transfer to chair with mechanical lift sling in place - nursing staff notified.  Patient will benefit from continued skilled physical therapy in hospital and recommended venue below to increase strength, balance, endurance for safe ADLs and gait.     Recommendations for follow up therapy are one component of a multi-disciplinary discharge planning process, led by the attending physician.  Recommendations may be updated based on patient status, additional functional criteria and insurance authorization.  Follow Up Recommendations  Skilled nursing-short term rehab (<3 hours/day) Can patient physically be transported by private vehicle: No   Assistance Recommended at Discharge Intermittent Supervision/Assistance  Patient can return home with the following A lot of help with walking and/or transfers;A lot of help with bathing/dressing/bathroom;Assistance with cooking/housework;Help with stairs or ramp  for entrance   Equipment Recommendations  None recommended by PT    Recommendations for Other Services       Precautions / Restrictions Precautions Precautions: Fall Restrictions Weight Bearing Restrictions: No     Mobility  Bed Mobility Overal bed mobility: Needs Assistance Bed Mobility: Supine to Sit     Supine to sit: Mod assist, Max assist     General bed mobility comments: increased time, labored movement    Transfers Overall transfer level: Needs assistance Equipment used: 1 person hand held assist Transfers: Sit to/from Stand, Bed to chair/wheelchair/BSC Sit to Stand: Max assist Stand pivot transfers: Max assist         General transfer comment: unable to transfer using RW due to weakness, required stand pivot right RLE blocked to transfer to chair    Ambulation/Gait                   Stairs             Wheelchair Mobility    Modified Rankin (Stroke Patients Only)       Balance Overall balance assessment: Needs assistance Sitting-balance support: Feet supported, No upper extremity supported Sitting balance-Leahy Scale: Fair Sitting balance - Comments: seated at EOB   Standing balance support: During functional activity, Bilateral upper extremity supported, Reliant on assistive device for balance Standing balance-Leahy Scale: Poor Standing balance comment: using RW                            Cognition Arousal/Alertness: Awake/alert Behavior During Therapy: WFL for tasks assessed/performed Overall Cognitive Status: Within Functional Limits for tasks assessed  Exercises Other Exercises Other Exercises: active assisted ROM to bilateral knees x 10 reps each    General Comments        Pertinent Vitals/Pain Pain Assessment Pain Assessment: Faces Faces Pain Scale: Hurts even more Pain Location: elbows, wrist, knees Pain Descriptors / Indicators:  Grimacing, Guarding, Sharp Pain Intervention(s): Limited activity within patient's tolerance, Monitored during session, Premedicated before session, Repositioned    Home Living                          Prior Function            PT Goals (current goals can now be found in the care plan section) Acute Rehab PT Goals Patient Stated Goal: return home PT Goal Formulation: With patient Time For Goal Achievement: 03/18/22 Potential to Achieve Goals: Good Progress towards PT goals: Progressing toward goals    Frequency    Min 3X/week      PT Plan Current plan remains appropriate    Co-evaluation              AM-PAC PT "6 Clicks" Mobility   Outcome Measure  Help needed turning from your back to your side while in a flat bed without using bedrails?: A Lot Help needed moving from lying on your back to sitting on the side of a flat bed without using bedrails?: A Lot Help needed moving to and from a bed to a chair (including a wheelchair)?: A Lot Help needed standing up from a chair using your arms (e.g., wheelchair or bedside chair)?: A Lot Help needed to walk in hospital room?: Total Help needed climbing 3-5 steps with a railing? : Total 6 Click Score: 10    End of Session   Activity Tolerance: Patient tolerated treatment well;Patient limited by fatigue Patient left: in chair;with call bell/phone within reach;with chair alarm set;Other (comment) (hoyar lift harness in place) Nurse Communication: Mobility status PT Visit Diagnosis: Unsteadiness on feet (R26.81);Other abnormalities of gait and mobility (R26.89);Muscle weakness (generalized) (M62.81)     Time: 1610-9604 PT Time Calculation (min) (ACUTE ONLY): 35 min  Charges:  $Therapeutic Activity: 23-37 mins                     2:13 PM, 03/07/22 Lonell Grandchild, MPT Physical Therapist with [facility] [phone] office [phone] mobile phone

## 2022-03-07 NOTE — Progress Notes (Addendum)
PROGRESS NOTE  Matthew [name] CBS:[id number] DOB: November 03, 1945 DOA: 02/28/2022 PCP: Charlsie Merles, MD   Brief History:  Matthew [name] is a 76 y.o. male with medical history significant of hypertension, hyperlipidemia, history of coronary artery disease status post CABG, status post AVR, chronic kidney disease stage IV, peripheral arterial disease and GERD; who presented to the emergency department secondary to shortness of breath, chest pressure and dyspnea exertion. Patient denies any focal deficits, coughing spells, hematuria, dysuria, melena, hematochezia, fever/chills or any other complaints.   Work-up demonstrating elevated BNP, hypoxia (new to patient) and significant worsening in his renal function.  [facility] has been contacted to place patient in the hospital for further evaluation and management.  Nephrology and cardiology service consulted.  He was started in IV lasix with symptomatic improvement.  Unfortunately, his serum creatinine continued to worsen.  After discussions with palliative medicine and the medical team, patient decided to pursue HD.  General surgery was consulted and plans for tunneled HD catheter on 03/06/22.   Assessment/Plan: Acute respiratory failure with hypoxia -due to pulmonary edema and fluid overload -improving with diuresis -weaned to RA   Acute HFpEF -02/28/22 Echo--EF 65-70%, no WMA, normal RVF, bioprosthetic AVR -clinically improving on IV lasix>>po lasix -Diuretics transitioned to oral route 10/31 -daily weight and strict I's and O's -Patient creatinine once diuresis has been initiated has trended up some (creatinine 7.35)   acute kidney injury on chronic renal failure stage IV -baseline creatinine 3.3-3.5 -presented with serum creatinine 5.89 -appreciate nephrology followup and recommendations -Continue strict I's and O's and daily weight -No signs of UTI -Renal ultrasound demonstrating chronic medical renal disease without  hydronephrosis or obstruction. -After IV diuresis provided creatinine continues to trend up -Palliative care consulted and after discussion with daughter and patient they are interested in full scope of practice and wish to pursue HD -general surgery consulted>>tunneled HD catheter 11/2 -03/06/22>>first HD session -03/07/22>>second HD   hypertension -Stable -Continue amlodipine and Coreg--holding for HD -Also receiving IV diuresis>>now on HD -As needed hydralazine has been ordered for systolic blood pressure above 846 and/or diastolic blood pressure more than 110. -anticipate improving BP with HD   anemia of chronic kidney disease -Given history of coronary disease plan is for transfusion if hemoglobin less than 7.5 -Fecal occult blood test at time of admission was positive; stopping heparin products and using SCDs for DVT prophylaxis -Continue monitoring hemoglobin trend -IV iron and Epogen as per nephrology discretion. -Last hemoglobin level 9.3 -No signs of overt bleeding appreciated. -Hgb has remained stable for the hospitalization   coronary artery disease -no chest pain -Continue aspirin and statin -Patient denies chest pain. -2D echo--EF 65-70%, no WMA, normal RVF   hyperlipidemia -Continue statin -Heart healthy diet discussed with patient.   gout -No acute flare currently -Continue Uloric   GERD -Continue Pepcid   stage II pressure injury -Localizing his coccyx; no signs of superimposed infection -Present at time of admission -Continue constant repositioning and preventive measures.   Epistaxis -had 10 min episode 11/3 -stopped with Afrin and pressure -Hgb stable    Family Communication:   daughter updated 11/3   Consultants:  renal, general surgery, palliative   Code Status:  FULL   DVT Prophylaxis:  SCDs     Procedures: As Listed in Progress Note Above   Antibiotics: None     Subjective: Patient denies fevers, chills, headache, chest pain,  dyspnea, nausea, vomiting, diarrhea, abdominal  pain, dysuria, hematuria, hematochezia, and melena.   Objective: Vitals:   03/07/22 1530 03/07/22 1545 03/07/22 1600 03/07/22 1630  BP: (!) 145/93 (!) 149/95 (!) 152/92 (!) 123/98  Pulse:      Resp: 18 18 18 16   Temp:      TempSrc:      SpO2:      Weight:      Height:        Intake/Output Summary (Last 24 hours) at 03/07/2022 1724 Last data filed at 03/06/2022 1900 Gross per 24 hour  Intake --  Output 0 ml  Net 0 ml   Weight change: -0.4 kg Exam:  General:  Pt is alert, follows commands appropriately, not in acute distress HEENT: No icterus, No thrush, No neck mass, [place]/AT Cardiovascular: RRR, S1/S2, no rubs, no gallops Respiratory: CTA bilaterally, no wheezing, no crackles, no rhonchi Abdomen: Soft/+BS, non tender, non distended, no guarding Extremities: No edema, No lymphangitis, No petechiae, No rashes, no synovitis   Data Reviewed: I have personally reviewed following labs and imaging studies Basic Metabolic Panel: Recent Labs  Lab 03/01/22 0536 03/02/22 0353 03/03/22 0531 03/04/22 0348 03/05/22 0451 03/05/22 1001 03/07/22 1410  NA 141   < > 139 136 134* 133* 134*  K 4.5   < > 4.0 4.2 4.0 4.1 3.8  CL 113*   < > 105 103 101 101 96*  CO2 20*   < > 20* 19* 19* 18* 21*  GLUCOSE 81   < > 128* 110* 109* 137* 149*  BUN 52*   < > 60* 75* 93* 96* 90*  CREATININE 5.13*   < > 6.07* 6.50* 7.12* 7.35* 6.25*  CALCIUM 8.3*   < > 8.6* 8.7* 8.4* 8.2* 8.7*  MG 1.9  --   --   --   --   --   --   PHOS  --    < > 6.6* 6.9* 8.0* 8.3* 7.5*   < > = values in this interval not displayed.   Liver Function Tests: Recent Labs  Lab 03/03/22 0531 03/04/22 0348 03/05/22 0451 03/05/22 1001 03/07/22 1410  ALBUMIN 3.3* 3.0* 2.8* 2.7* 3.0*   No results for input(s): "LIPASE", "AMYLASE" in the last 168 hours. No results for input(s): "AMMONIA" in the last 168 hours. Coagulation Profile: No results for input(s): "INR", "PROTIME" in the  last 168 hours. CBC: Recent Labs  Lab 03/01/22 0536 03/02/22 0353 03/05/22 0451 03/07/22 1445  WBC 4.8 8.0 10.8* 11.9*  NEUTROABS 2.6  --   --   --   HGB 7.8* 9.3* 8.2* 9.7*  HCT 24.3* 29.2* 25.5* 29.8*  MCV 96.0 94.8 95.1 93.4  PLT 183 215 227 361   Cardiac Enzymes: No results for input(s): "CKTOTAL", "CKMB", "CKMBINDEX", "TROPONINI" in the last 168 hours. BNP: Invalid input(s): "POCBNP" CBG: No results for input(s): "GLUCAP" in the last 168 hours. HbA1C: No results for input(s): "HGBA1C" in the last 72 hours. Urine analysis:    Component Value Date/Time   COLORURINE YELLOW 02/28/2022 0112   APPEARANCEUR CLEAR 02/28/2022 0112   LABSPEC 1.013 02/28/2022 0112   PHURINE 5.0 02/28/2022 0112   GLUCOSEU NEGATIVE 02/28/2022 0112   HGBUR NEGATIVE 02/28/2022 0112   BILIRUBINUR NEGATIVE 02/28/2022 0112   KETONESUR NEGATIVE 02/28/2022 0112   PROTEINUR >=300 (A) 02/28/2022 0112   NITRITE NEGATIVE 02/28/2022 0112   LEUKOCYTESUR SMALL (A) 02/28/2022 0112   Sepsis Labs: @LABRCNTIP (procalcitonin:4,lacticidven:4) ) Recent Results (from the past 240 hour(s))  Resp Panel by RT-PCR (Flu  A&B, Covid) Anterior Nasal Swab     Status: None   Collection Time: 02/28/22 12:25 AM   Specimen: Anterior Nasal Swab  Result Value Ref Range Status   SARS Coronavirus 2 by RT PCR NEGATIVE NEGATIVE Final    Comment: (NOTE) SARS-CoV-2 target nucleic acids are NOT DETECTED.  The SARS-CoV-2 RNA is generally detectable in upper respiratory specimens during the acute phase of infection. The lowest concentration of SARS-CoV-2 viral copies this assay can detect is 138 copies/mL. A negative result does not preclude SARS-Cov-2 infection and should not be used as the sole basis for treatment or other patient management decisions. A negative result may occur with  improper specimen collection/handling, submission of specimen other than nasopharyngeal swab, presence of viral mutation(s) within the areas  targeted by this assay, and inadequate number of viral copies(<138 copies/mL). A negative result must be combined with clinical observations, patient history, and epidemiological information. The expected result is Negative.  Fact Sheet for Patients:  EntrepreneurPulse.com.au  Fact Sheet for Healthcare Providers:  IncredibleEmployment.be  This test is no t yet approved or cleared by the [place] FDA and  has been authorized for detection and/or diagnosis of SARS-CoV-2 by FDA under an Emergency Use Authorization (EUA). This EUA will remain  in effect (meaning this test can be used) for the duration of the COVID-19 declaration under Section 564(b)(1) of the Act, 21 U.S.C.section 360bbb-3(b)(1), unless the authorization is terminated  or revoked sooner.       Influenza A by PCR NEGATIVE NEGATIVE Final   Influenza B by PCR NEGATIVE NEGATIVE Final    Comment: (NOTE) The Xpert Xpress SARS-CoV-2/FLU/RSV plus assay is intended as an aid in the diagnosis of influenza from Nasopharyngeal swab specimens and should not be used as a sole basis for treatment. Nasal washings and aspirates are unacceptable for Xpert Xpress SARS-CoV-2/FLU/RSV testing.  Fact Sheet for Patients: EntrepreneurPulse.com.au  Fact Sheet for Healthcare Providers: IncredibleEmployment.be  This test is not yet approved or cleared by the [place] FDA and has been authorized for detection and/or diagnosis of SARS-CoV-2 by FDA under an Emergency Use Authorization (EUA). This EUA will remain in effect (meaning this test can be used) for the duration of the COVID-19 declaration under Section 564(b)(1) of the Act, 21 U.S.C. section 360bbb-3(b)(1), unless the authorization is terminated or revoked.  Performed at [facility], [address], [place], [place] [address]   Urine Culture     Status: None   Collection Time: 02/28/22  1:12 AM    Specimen: Urine, Clean Catch  Result Value Ref Range Status   Specimen Description   Final    URINE, CLEAN CATCH Performed at [facility], [address], [place], [place] [address]    Special Requests   Final    NONE Performed at [facility], [address], [place], [place] [address]    Culture   Final    NO GROWTH Performed at [facility], [place] [address] [place], [place] [address]    Report Status 03/01/2022 FINAL  Final  MRSA Next Gen by PCR, Nasal     Status: None   Collection Time: 02/28/22 12:01 PM   Specimen: Nasal Mucosa; Nasal Swab  Result Value Ref Range Status   MRSA by PCR Next Gen NOT DETECTED NOT DETECTED Final    Comment: (NOTE) The GeneXpert MRSA Assay (FDA approved for NASAL specimens only), is one component of a comprehensive MRSA colonization surveillance program. It is not intended to diagnose  MRSA infection nor to guide or monitor treatment for MRSA infections. Test performance is not FDA approved in patients less than 70 years old. Performed at [facility], [address] [place], [place] [address]   Surgical pcr screen     Status: None   Collection Time: 03/05/22  3:46 PM   Specimen: Nasal Mucosa; Nasal Swab  Result Value Ref Range Status   MRSA, PCR NEGATIVE NEGATIVE Final   Staphylococcus aureus NEGATIVE NEGATIVE Final    Comment: (NOTE) The Xpert SA Assay (FDA approved for NASAL specimens in patients 36 years of age and older), is one component of a comprehensive surveillance program. It is not intended to diagnose infection nor to guide or monitor treatment. Performed at [facility], [address], [place], [place] [address]      Scheduled Meds:  amLODipine  10 mg Oral q morning   aspirin EC  81 mg Oral Daily   atorvastatin  80 mg Oral q1800   carvedilol  25 mg Oral BID   Chlorhexidine Gluconate Cloth  6 each Topical Q0600   Chlorhexidine Gluconate Cloth  6 each Topical Once   darbepoetin (ARANESP) injection - NON-DIALYSIS  100  mcg Subcutaneous Q Mon-1800   diclofenac Sodium  2 g Topical QID   famotidine  10 mg Oral Daily   febuxostat  40 mg Oral Daily   furosemide  40 mg Oral BID   oxymetazoline  3 spray Each Nare BID   sodium chloride flush  3 mL Intravenous Q12H   Continuous Infusions:  sodium chloride 10 mL/hr at 03/06/22 1241   anticoagulant sodium citrate      Procedures/Studies: DG Chest Port 1 View  Result Date: 03/06/2022 CLINICAL DATA:  Placement of dialysis catheter EXAM: PORTABLE CHEST 1 VIEW COMPARISON:  Previous studies including the examination done on 02/28/2022 FINDINGS: Transverse diameter of heart is slightly increased. There are no signs of pulmonary edema or focal pulmonary consolidation. 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 There is no pleural effusion or pneumothorax. There is interval placement of right IJ central venous catheter with its tip in the right atrium. IMPRESSION: Tip of right IJ central venous catheter is seen at the junction of superior vena cava and right atrium. There are no signs of pulmonary edema or focal pulmonary consolidation. There is no pleural effusion or pneumothorax. Electronically Signed   By: Elmer Picker M.D.   On: 03/06/2022 13:41   DG C-Arm 1-60 Min-No Report  Result Date: 03/06/2022 Fluoroscopy was utilized by the requesting physician.  No radiographic interpretation.   US RENAL  Result Date: 02/28/2022 CLINICAL DATA:  Acute renal insufficiency EXAM: RENAL / URINARY TRACT ULTRASOUND COMPLETE COMPARISON:  03/09/2021, 06/25/2021 FINDINGS: Right Kidney: Renal measurements: 11.2 x 5.0 x 5.9 cm = volume: 172.5 mL. Increased renal cortical echotexture. There is a benign simple cyst measuring 1.5 cm off the ventral mid aspect of the kidney. No specific imaging follow-up is required. No hydronephrosis or nephrolithiasis. Left Kidney: Renal measurements: 13.8 x 7.7 by 7.9 cm = volume: 440.2 mL. There is left renal cortical thinning, with increased renal  cortical echotexture. Multiple cysts are again identified, largest measuring up to 6.4 cm. No hydronephrosis or nephrolithiasis. Bladder: Appears normal for degree of bladder distention. Other: None. IMPRESSION: 1. Bilateral increased renal cortical echotexture, left renal cortical thinning, consistent with medical renal disease. 2. Bilateral renal cortical cysts, unchanged since prior exam. Electronically Signed   By: Randa Ngo M.D.   On: 02/28/2022 15:24  ECHOCARDIOGRAM COMPLETE  Result Date: 02/28/2022    ECHOCARDIOGRAM REPORT   Patient Name:   Matthew [name] Date of Exam: 02/28/2022 Medical Rec #:  [id number]       Height:       68.0 in Accession #:    [id number]      Weight:       180.0 lb Date of Birth:  1945/06/18       BSA:          1.954 m Patient Age:    10 years        BP:           197/83 mmHg Patient Gender: M               HR:           71 bpm. Exam Location:  [facility] Procedure: 2D Echo, Cardiac Doppler and Color Doppler Indications:    Congestive Heart Failure I50.9  History:        Patient has prior history of Echocardiogram examinations, most                 recent 10/07/2018. Prior CABG; Risk Factors:Hypertension,                 Dyslipidemia and Pre-diabetes. CKD (chronic kidney disease),                 stage III (HCC), Non-ST elevation (NSTEMI) myocardial infarction                 [facility]), s/p AVR.  Sonographer:    Alvino Chapel RCS Referring Phys: [id number] ASIA B Holstein  1. Left ventricular ejection fraction, by estimation, is 65 to 70%. The left ventricle has normal function. The left ventricle has no regional wall motion abnormalities. There is moderate left ventricular hypertrophy. Left ventricular diastolic parameters are indeterminate. Elevated left atrial pressure.  2. Right ventricular systolic function is normal. The right ventricular size is normal.  3. Left atrial size was mildly dilated.  4. Right atrial size was mildly dilated.  5. The mitral valve is  abnormal. Mild mitral valve regurgitation. No evidence of mitral stenosis.  6. A 48mm Edwards INSPIRIS RESILIA PERICARDIAL VALVE bioprosthesis valve     is present in the aortic position. The aortic valve has been repaired/replaced. Aortic valve regurgitation is not visualized. No aortic stenosis is present.  7. The inferior vena cava is normal in size with greater than 50% respiratory variability, suggesting right atrial pressure of 3 mmHg. FINDINGS  Left Ventricle: Left ventricular ejection fraction, by estimation, is 65 to 70%. The left ventricle has normal function. The left ventricle has no regional wall motion abnormalities. The left ventricular internal cavity size was normal in size. There is  moderate left ventricular hypertrophy. Left ventricular diastolic parameters are indeterminate. Elevated left atrial pressure. Right Ventricle: The right ventricular size is normal. Right vetricular wall thickness was not well visualized. Right ventricular systolic function is normal. Left Atrium: Left atrial size was mildly dilated. Right Atrium: Right atrial size was mildly dilated. Pericardium: There is no evidence of pericardial effusion. Mitral Valve: The mitral valve is abnormal. There is mild thickening of the mitral valve leaflet(s). There is mild calcification of the mitral valve leaflet(s). Mild mitral annular calcification. Mild mitral valve regurgitation. No evidence of mitral valve stenosis. Tricuspid Valve: The tricuspid valve is normal in structure. Tricuspid valve regurgitation is trivial. No evidence of tricuspid stenosis. Aortic Valve: A 79mm  Edwards INSPIRIS RESILIA PERICARDIAL VALVE bioprosthesis valve is present in the aortic position. The aortic valve has been repaired/replaced. Aortic valve regurgitation is not visualized. No aortic stenosis is present. Aortic valve mean gradient measures 15.5 mmHg. Aortic valve peak gradient measures 31.8 mmHg. Aortic valve area, by VTI measures 1.24 cm.  Pulmonic Valve: The pulmonic valve was not well visualized. Pulmonic valve regurgitation is not visualized. No evidence of pulmonic stenosis. Aorta: The aortic root is normal in size and structure. Venous: The inferior vena cava is normal in size with greater than 50% respiratory variability, suggesting right atrial pressure of 3 mmHg. IAS/Shunts: No atrial level shunt detected by color flow Doppler.  LEFT VENTRICLE PLAX 2D LVIDd:         4.00 cm   Diastology LVIDs:         2.20 cm   LV e' medial:    5.87 cm/s LV PW:         1.30 cm   LV E/e' medial:  23.0 LV IVS:        1.30 cm   LV e' lateral:   8.38 cm/s LVOT diam:     1.80 cm   LV E/e' lateral: 16.1 LV SV:         78 LV SV Index:   40 LVOT Area:     2.54 cm  RIGHT VENTRICLE RV S prime:     14.50 cm/s TAPSE (M-mode): 2.0 cm LEFT ATRIUM             Index        RIGHT ATRIUM           Index LA diam:        4.00 cm 2.05 cm/m   RA Area:     22.80 cm LA Vol (A2C):   80.5 ml 41.19 ml/m  RA Volume:   76.20 ml  38.99 ml/m LA Vol (A4C):   65.5 ml 33.52 ml/m LA Biplane Vol: 77.9 ml 39.86 ml/m  AORTIC VALVE AV Area (Vmax):    1.06 cm AV Area (Vmean):   1.18 cm AV Area (VTI):     1.24 cm AV Vmax:           282.00 cm/s AV Vmean:          179.000 cm/s AV VTI:            0.634 m AV Peak Grad:      31.8 mmHg AV Mean Grad:      15.5 mmHg LVOT Vmax:         117.00 cm/s LVOT Vmean:        83.100 cm/s LVOT VTI:          0.308 m LVOT/AV VTI ratio: 0.49  AORTA Ao Root diam: 3.40 cm MITRAL VALVE MV Area (PHT): 5.27 cm     SHUNTS MV Decel Time: 144 msec     Systemic VTI:  0.31 m MV E velocity: 135.00 cm/s  Systemic Diam: 1.80 cm MV A velocity: 123.00 cm/s MV E/A ratio:  1.10 Carlyle Dolly MD Electronically signed by Carlyle Dolly MD Signature Date/Time: 02/28/2022/1:33:18 PM    Final    DG Chest 2 View  Result Date: 02/28/2022 CLINICAL DATA:  Shortness of breath. EXAM: CHEST - 2 VIEW COMPARISON:  Chest radiograph dated 08/05/2021. FINDINGS: Trace pleural effusion on  the lateral view. No focal consolidation, or pneumothorax. The cardiac silhouette is within limits. Median sternotomy wires and mechanical cardiac valve. No acute osseous pathology.  IMPRESSION: Trace pleural effusions.  No focal consolidation. Electronically Signed   By: Anner Crete M.D.   On: 02/28/2022 01:02    Orson Eva, DO  [facility]  If 7PM-7AM, please contact night-coverage [email or web address] Password [email or web address] 03/07/2022, 5:24 PM   LOS: 7 days

## 2022-03-07 NOTE — Progress Notes (Addendum)
Patient ID: Matthew [name], male   DOB: 17-Dec-1945, 76 y.o.   MRN: [id number] S: Feels well, no new complaints.  Tolerated HD well yesterday evening. O:BP 138/76 (BP Location: Right Arm)   Pulse 73   Temp 99 F (37.2 C) (Oral)   Resp 19   Ht 5\' 8"  (1.727 m)   Wt 80.2 kg   SpO2 98%   BMI 26.88 kg/m   Intake/Output Summary (Last 24 hours) at 03/07/2022 0855 Last data filed at 03/06/2022 1900 Gross per 24 hour  Intake 250 ml  Output 605 ml  Net -355 ml   Intake/Output: I/O last 3 completed shifts: In: 250 [P.O.:250] Out: 1206 [Urine:1200; Stool:1; Blood:5]  Intake/Output this shift:  No intake/output data recorded. Weight change: -0.4 kg Gen:NAD CVS: RRR Resp: CTA Abd: +BS, soft, NT/ND Ext: trace pretibial edema bialterally  Recent Labs  Lab 03/01/22 0536 03/02/22 0353 03/03/22 0531 03/04/22 0348 03/05/22 0451 03/05/22 1001  NA 141 139 139 136 134* 133*  K 4.5 4.7 4.0 4.2 4.0 4.1  CL 113* 108 105 103 101 101  CO2 20* 18* 20* 19* 19* 18*  GLUCOSE 81 93 128* 110* 109* 137*  BUN 52* 52* 60* 75* 93* 96*  CREATININE 5.13* 5.18* 6.07* 6.50* 7.12* 7.35*  ALBUMIN  --  3.4* 3.3* 3.0* 2.8* 2.7*  CALCIUM 8.3* 8.8* 8.6* 8.7* 8.4* 8.2*  PHOS  --  5.4* 6.6* 6.9* 8.0* 8.3*   Liver Function Tests: Recent Labs  Lab 03/04/22 0348 03/05/22 0451 03/05/22 1001  ALBUMIN 3.0* 2.8* 2.7*   No results for input(s): "LIPASE", "AMYLASE" in the last 168 hours. No results for input(s): "AMMONIA" in the last 168 hours. CBC: Recent Labs  Lab 03/01/22 0536 03/02/22 0353 03/05/22 0451  WBC 4.8 8.0 10.8*  NEUTROABS 2.6  --   --   HGB 7.8* 9.3* 8.2*  HCT 24.3* 29.2* 25.5*  MCV 96.0 94.8 95.1  PLT 183 215 227   Cardiac Enzymes: No results for input(s): "CKTOTAL", "CKMB", "CKMBINDEX", "TROPONINI" in the last 168 hours. CBG: No results for input(s): "GLUCAP" in the last 168 hours.  Iron Studies: No results for input(s): "IRON", "TIBC", "TRANSFERRIN", "FERRITIN" in the last 72  hours. Studies/Results: DG Chest Port 1 View  Result Date: 03/06/2022 CLINICAL DATA:  Placement of dialysis catheter EXAM: PORTABLE CHEST 1 VIEW COMPARISON:  Previous studies including the examination done on 02/28/2022 FINDINGS: Transverse diameter of heart is slightly increased. There are no signs of pulmonary edema or focal pulmonary consolidation. 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 There is no pleural effusion or pneumothorax. There is interval placement of right IJ central venous catheter with its tip in the right atrium. IMPRESSION: Tip of right IJ central venous catheter is seen at the junction of superior vena cava and right atrium. There are no signs of pulmonary edema or focal pulmonary consolidation. There is no pleural effusion or pneumothorax. Electronically Signed   By: Elmer Picker M.D.   On: 03/06/2022 13:41   DG C-Arm 1-60 Min-No Report  Result Date: 03/06/2022 Fluoroscopy was utilized by the requesting physician.  No radiographic interpretation.    amLODipine  10 mg Oral q morning   aspirin EC  81 mg Oral Daily   atorvastatin  80 mg Oral q1800   carvedilol  25 mg Oral BID   Chlorhexidine Gluconate Cloth  6 each Topical Q0600   Chlorhexidine Gluconate Cloth  6 each Topical Once   darbepoetin (ARANESP) injection - NON-DIALYSIS  100 mcg  Subcutaneous Q Mon-1800   diclofenac Sodium  2 g Topical QID   famotidine  10 mg Oral Daily   febuxostat  40 mg Oral Daily   furosemide  40 mg Oral BID   sodium chloride flush  3 mL Intravenous Q12H    BMET    Component Value Date/Time   NA 133 (L) 03/05/2022 1001   K 4.1 03/05/2022 1001   CL 101 03/05/2022 1001   CO2 18 (L) 03/05/2022 1001   GLUCOSE 137 (H) 03/05/2022 1001   BUN 96 (H) 03/05/2022 1001   CREATININE 7.35 (H) 03/05/2022 1001   CALCIUM 8.2 (L) 03/05/2022 1001   GFRNONAA 7 (L) 03/05/2022 1001   GFRAA 34 (L) 10/07/2018 0651   CBC    Component Value Date/Time   WBC 10.8 (H) 03/05/2022 0451   RBC 2.68  (L) 03/05/2022 0451   HGB 8.2 (L) 03/05/2022 0451   HCT 25.5 (L) 03/05/2022 0451   PLT 227 03/05/2022 0451   MCV 95.1 03/05/2022 0451   MCH 30.6 03/05/2022 0451   MCHC 32.2 03/05/2022 0451   RDW 13.0 03/05/2022 0451   LYMPHSABS 1.2 03/01/2022 0536   MONOABS 0.7 03/01/2022 0536   EOSABS 0.3 03/01/2022 0536   BASOSABS 0.0 03/01/2022 0536     Assessment/Plan:   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  He is a marginal candidate for dialysis given his poor functional and nutritional status, however he wants dialysis instead of hospice.  Dr. Constance Haw  kindly placed [facility] 03/06/22.  He tolerated his first HD session well.  Will continue slow and short serial dialysis for the next 2 days.  Will need CM/SW assistance with placement for outpatient HD near his SNF. Acute on chronic CHF - improved with diuresis. 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 Moderate protein malnutrition - will need protein supplementation Deconditioning - PT recommending SNF.  SW involved.  Disposition - will need outpatient HD arrangements near his SNF prior to discharge.  Also need to make sure his SNF can provide transportation to and from HD.  Donetta Potts, MD [facility]  He will not be seen over the weekend, however his labs will be reviewed remotely and on call coverage will be available if needed.  He will be seen again on Monday.

## 2022-03-08 DIAGNOSIS — N184 Chronic kidney disease, stage 4 (severe): Secondary | ICD-10-CM | POA: Diagnosis not present

## 2022-03-08 DIAGNOSIS — I5031 Acute diastolic (congestive) heart failure: Secondary | ICD-10-CM | POA: Diagnosis not present

## 2022-03-08 DIAGNOSIS — N179 Acute kidney failure, unspecified: Secondary | ICD-10-CM | POA: Diagnosis not present

## 2022-03-08 LAB — RENAL FUNCTION PANEL
Albumin: 2.7 g/dL — ABNORMAL LOW (ref 3.5–5.0)
Anion gap: 14 (ref 5–15)
BUN: 54 mg/dL — ABNORMAL HIGH (ref 8–23)
CO2: 27 mmol/L (ref 22–32)
Calcium: 8.7 mg/dL — ABNORMAL LOW (ref 8.9–10.3)
Chloride: 95 mmol/L — ABNORMAL LOW (ref 98–111)
Creatinine, Ser: 4.38 mg/dL — ABNORMAL HIGH (ref 0.61–1.24)
GFR, Estimated: 13 mL/min — ABNORMAL LOW (ref >60)
Glucose, Bld: 102 mg/dL — ABNORMAL HIGH (ref 70–99)
Phosphorus: 5.7 mg/dL — ABNORMAL HIGH (ref 2.5–4.6)
Potassium: 3.2 mmol/L — ABNORMAL LOW (ref 3.5–5.1)
Sodium: 136 mmol/L (ref 135–145)

## 2022-03-08 LAB — CBC
HCT: 27.3 % — ABNORMAL LOW (ref 39.0–52.0)
Hemoglobin: 8.9 g/dL — ABNORMAL LOW (ref 13.0–17.0)
MCH: 30.8 pg (ref 26.0–34.0)
MCHC: 32.6 g/dL (ref 30.0–36.0)
MCV: 94.5 fL (ref 80.0–100.0)
Platelets: 302 10*3/uL (ref 150–400)
RBC: 2.89 MIL/uL — ABNORMAL LOW (ref 4.22–5.81)
RDW: 12.8 % (ref 11.5–15.5)
WBC: 10.8 10*3/uL — ABNORMAL HIGH (ref 4.0–10.5)
nRBC: 0 % (ref 0.0–0.2)

## 2022-03-08 NOTE — Plan of Care (Signed)

## 2022-03-08 NOTE — Progress Notes (Signed)
PROGRESS NOTE  Matthew [name] BJS:[id number] DOB: June 21, 1945 DOA: 02/28/2022 PCP: Charlsie Merles, MD   Brief History:  Matthew [name] is a 76 y.o. male with medical history significant of hypertension, hyperlipidemia, history of coronary artery disease status post CABG, status post AVR, chronic kidney disease stage IV, peripheral arterial disease and GERD; who presented to the emergency department secondary to shortness of breath, chest pressure and dyspnea exertion. Patient denies any focal deficits, coughing spells, hematuria, dysuria, melena, hematochezia, fever/chills or any other complaints.   Work-up demonstrating elevated BNP, hypoxia (new to patient) and significant worsening in his renal function.  TRH has been contacted to place patient in the hospital for further evaluation and management.  Nephrology and cardiology service consulted.  He was started in IV lasix with symptomatic improvement.  Unfortunately, his serum creatinine continued to worsen.  After discussions with palliative medicine and the medical team, patient decided to pursue HD.  General surgery was consulted and plans for tunneled HD catheter on 03/06/22.   Assessment/Plan: Acute respiratory failure with hypoxia -due to pulmonary edema and fluid overload -improving with diuresis -weaned to RA   Acute HFpEF -02/28/22 Echo--EF 65-70%, no WMA, normal RVF, bioprosthetic AVR -clinically improving on IV lasix>>po lasix -Diuretics transitioned to oral route 10/31 -daily weight and strict I's and O's -Patient creatinine once diuresis has been initiated has trended up some (creatinine 7.35)   acute kidney injury on chronic renal failure stage IV -baseline creatinine 3.3-3.5 -presented with serum creatinine 5.89 -appreciate nephrology followup and recommendations -Continue strict I's and O's and daily weight -No signs of UTI -Renal ultrasound demonstrating chronic medical renal disease without  hydronephrosis or obstruction. -After IV diuresis provided creatinine continues to trend up -Palliative care consulted and after discussion with daughter and patient they are interested in full scope of practice and wish to pursue HD -general surgery consulted>>tunneled HD catheter 11/2 -03/06/22>>first HD session -03/07/22>>second HD -plan third HD 11/5   hypertension -Stable -Continue amlodipine and Coreg--holding for HD -Also receiving IV diuresis>>now on HD -As needed hydralazine has been ordered for systolic blood pressure above 607 and/or diastolic blood pressure more than 110. -anticipate improving BP with HD   anemia of chronic kidney disease -Given history of coronary disease plan is for transfusion if hemoglobin less than 7.5 -Fecal occult blood test at time of admission was positive; stopping heparin products and using SCDs for DVT prophylaxis -Continue monitoring hemoglobin trend -IV iron and Epogen as per nephrology discretion. -Last hemoglobin level 9.3 -No signs of overt bleeding appreciated. -Hgb has remained stable for the hospitalization   coronary artery disease -no chest pain -Continue aspirin and statin -Patient denies chest pain. -2D echo--EF 65-70%, no WMA, normal RVF   hyperlipidemia -Continue statin -Heart healthy diet discussed with patient.   gout -No acute flare currently -Continue Uloric   GERD -Continue Pepcid   stage II pressure injury -Localizing his coccyx; no signs of superimposed infection -Present at time of admission -Continue constant repositioning and preventive measures.   Epistaxis -had 10 min episode 11/3 -stopped with Afrin and pressure -Hgb stable     Family Communication:   daughter updated 11/3   Consultants:  renal, general surgery, palliative   Code Status:  FULL   DVT Prophylaxis:  SCDs     Procedures: As Listed in Progress Note Above   Antibiotics: None       Subjective:  Patient denies fevers,  chills,  headache, chest pain, dyspnea, nausea, vomiting, diarrhea, abdominal pain, dysuria, hematuria, hematochezia, and melena.  Objective: Vitals:   03/07/22 1955 03/08/22 0505 03/08/22 0846 03/08/22 1324  BP: 129/66 137/65 (!) 153/69 (!) 148/78  Pulse: 90 67  64  Resp: 19 18  20   Temp: 98 F (36.7 C) 97.7 F (36.5 C)  (!) 97.5 F (36.4 C)  TempSrc: Oral Oral  Oral  SpO2: 99% 98%  98%  Weight:  81 kg    Height:        Intake/Output Summary (Last 24 hours) at 03/08/2022 1642 Last data filed at 03/07/2022 1939 Gross per 24 hour  Intake --  Output 1300 ml  Net -1300 ml   Weight change: 0.1 kg Exam:  General:  Pt is alert, follows commands appropriately, not in acute distress HEENT: No icterus, No thrush, No neck mass, [place]/AT Cardiovascular: RRR, S1/S2, no rubs, no gallops Respiratory: CTA bilaterally, no wheezing, no crackles, no rhonchi Abdomen: Soft/+BS, non tender, non distended, no guarding Extremities: No edema, No lymphangitis, No petechiae, No rashes, no synovitis   Data Reviewed: I have personally reviewed following labs and imaging studies Basic Metabolic Panel: Recent Labs  Lab 03/04/22 0348 03/05/22 0451 03/05/22 1001 03/07/22 1410 03/08/22 0342  NA 136 134* 133* 134* 136  K 4.2 4.0 4.1 3.8 3.2*  CL 103 101 101 96* 95*  CO2 19* 19* 18* 21* 27  GLUCOSE 110* 109* 137* 149* 102*  BUN 75* 93* 96* 90* 54*  CREATININE 6.50* 7.12* 7.35* 6.25* 4.38*  CALCIUM 8.7* 8.4* 8.2* 8.7* 8.7*  PHOS 6.9* 8.0* 8.3* 7.5* 5.7*   Liver Function Tests: Recent Labs  Lab 03/04/22 0348 03/05/22 0451 03/05/22 1001 03/07/22 1410 03/08/22 0342  ALBUMIN 3.0* 2.8* 2.7* 3.0* 2.7*   No results for input(s): "LIPASE", "AMYLASE" in the last 168 hours. No results for input(s): "AMMONIA" in the last 168 hours. Coagulation Profile: No results for input(s): "INR", "PROTIME" in the last 168 hours. CBC: Recent Labs  Lab 03/02/22 0353 03/05/22 0451 03/07/22 1445 03/08/22 0342   WBC 8.0 10.8* 11.9* 10.8*  HGB 9.3* 8.2* 9.7* 8.9*  HCT 29.2* 25.5* 29.8* 27.3*  MCV 94.8 95.1 93.4 94.5  PLT 215 227 361 302   Cardiac Enzymes: No results for input(s): "CKTOTAL", "CKMB", "CKMBINDEX", "TROPONINI" in the last 168 hours. BNP: Invalid input(s): "POCBNP" CBG: No results for input(s): "GLUCAP" in the last 168 hours. HbA1C: No results for input(s): "HGBA1C" in the last 72 hours. Urine analysis:    Component Value Date/Time   COLORURINE YELLOW 02/28/2022 0112   APPEARANCEUR CLEAR 02/28/2022 0112   LABSPEC 1.013 02/28/2022 0112   PHURINE 5.0 02/28/2022 0112   GLUCOSEU NEGATIVE 02/28/2022 0112   HGBUR NEGATIVE 02/28/2022 0112   BILIRUBINUR NEGATIVE 02/28/2022 0112   KETONESUR NEGATIVE 02/28/2022 0112   PROTEINUR >=300 (A) 02/28/2022 0112   NITRITE NEGATIVE 02/28/2022 0112   LEUKOCYTESUR SMALL (A) 02/28/2022 0112   Sepsis Labs: @LABRCNTIP (procalcitonin:4,lacticidven:4) ) Recent Results (from the past 240 hour(s))  Resp Panel by RT-PCR (Flu A&B, Covid) Anterior Nasal Swab     Status: None   Collection Time: 02/28/22 12:25 AM   Specimen: Anterior Nasal Swab  Result Value Ref Range Status   SARS Coronavirus 2 by RT PCR NEGATIVE NEGATIVE Final    Comment: (NOTE) SARS-CoV-2 target nucleic acids are NOT DETECTED.  The SARS-CoV-2 RNA is generally detectable in upper respiratory specimens during the acute phase of infection. The lowest concentration of SARS-CoV-2 viral copies this assay  can detect is 138 copies/mL. A negative result does not preclude SARS-Cov-2 infection and should not be used as the sole basis for treatment or other patient management decisions. A negative result may occur with  improper specimen collection/handling, submission of specimen other than nasopharyngeal swab, presence of viral mutation(s) within the areas targeted by this assay, and inadequate number of viral copies(<138 copies/mL). A negative result must be combined with clinical  observations, patient history, and epidemiological information. The expected result is Negative.  Fact Sheet for Patients:  EntrepreneurPulse.com.au  Fact Sheet for Healthcare Providers:  IncredibleEmployment.be  This test is no t yet approved or cleared by the [place] FDA and  has been authorized for detection and/or diagnosis of SARS-CoV-2 by FDA under an Emergency Use Authorization (EUA). This EUA will remain  in effect (meaning this test can be used) for the duration of the COVID-19 declaration under Section 564(b)(1) of the Act, 21 U.S.C.section 360bbb-3(b)(1), unless the authorization is terminated  or revoked sooner.       Influenza A by PCR NEGATIVE NEGATIVE Final   Influenza B by PCR NEGATIVE NEGATIVE Final    Comment: (NOTE) The Xpert Xpress SARS-CoV-2/FLU/RSV plus assay is intended as an aid in the diagnosis of influenza from Nasopharyngeal swab specimens and should not be used as a sole basis for treatment. Nasal washings and aspirates are unacceptable for Xpert Xpress SARS-CoV-2/FLU/RSV testing.  Fact Sheet for Patients: EntrepreneurPulse.com.au  Fact Sheet for Healthcare Providers: IncredibleEmployment.be  This test is not yet approved or cleared by the [place] FDA and has been authorized for detection and/or diagnosis of SARS-CoV-2 by FDA under an Emergency Use Authorization (EUA). This EUA will remain in effect (meaning this test can be used) for the duration of the COVID-19 declaration under Section 564(b)(1) of the Act, 21 U.S.C. section 360bbb-3(b)(1), unless the authorization is terminated or revoked.  Performed at [facility], [address] [place], [place] [address]   Urine Culture     Status: None   Collection Time: 02/28/22  1:12 AM   Specimen: Urine, Clean Catch  Result Value Ref Range Status   Specimen Description   Final    URINE, CLEAN CATCH Performed at  [facility], [address] [place], [place] [address]    Special Requests   Final    NONE Performed at [facility], [address] [place], [place] [address]    Culture   Final    NO GROWTH Performed at [facility], [place] [address], [place], [place] [address]    Report Status 03/01/2022 FINAL  Final  MRSA Next Gen by PCR, Nasal     Status: None   Collection Time: 02/28/22 12:01 PM   Specimen: Nasal Mucosa; Nasal Swab  Result Value Ref Range Status   MRSA by PCR Next Gen NOT DETECTED NOT DETECTED Final    Comment: (NOTE) The GeneXpert MRSA Assay (FDA approved for NASAL specimens only), is one component of a comprehensive MRSA colonization surveillance program. It is not intended to diagnose MRSA infection nor to guide or monitor treatment for MRSA infections. Test performance is not FDA approved in patients less than 46 years old. Performed at [facility], [address], [place], [place] [address]   Surgical pcr screen     Status: None   Collection Time: 03/05/22  3:46 PM   Specimen: Nasal Mucosa; Nasal Swab  Result Value Ref Range Status   MRSA, PCR NEGATIVE NEGATIVE Final   Staphylococcus aureus NEGATIVE  NEGATIVE Final    Comment: (NOTE) The Xpert SA Assay (FDA approved for NASAL specimens in patients 50 years of age and older), is one component of a comprehensive surveillance program. It is not intended to diagnose infection nor to guide or monitor treatment. Performed at [facility], [address], [place], [place] [address]      Scheduled Meds:  amLODipine  10 mg Oral q morning   aspirin EC  81 mg Oral Daily   atorvastatin  80 mg Oral q1800   carvedilol  25 mg Oral BID   Chlorhexidine Gluconate Cloth  6 each Topical Q0600   Chlorhexidine Gluconate Cloth  6 each Topical Once   darbepoetin (ARANESP) injection - NON-DIALYSIS  100 mcg Subcutaneous Q Mon-1800   diclofenac Sodium  2 g Topical QID   famotidine  10 mg Oral Daily   febuxostat  40 mg Oral Daily    furosemide  40 mg Oral BID   oxymetazoline  3 spray Each Nare BID   sodium chloride flush  3 mL Intravenous Q12H   Continuous Infusions:  sodium chloride 10 mL/hr at 03/06/22 1241   anticoagulant sodium citrate      Procedures/Studies: DG Chest Port 1 View  Result Date: 03/06/2022 CLINICAL DATA:  Placement of dialysis catheter EXAM: PORTABLE CHEST 1 VIEW COMPARISON:  Previous studies including the examination done on 02/28/2022 FINDINGS: Transverse diameter of heart is slightly increased. There are no signs of pulmonary edema or focal pulmonary consolidation. 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 There is no pleural effusion or pneumothorax. There is interval placement of right IJ central venous catheter with its tip in the right atrium. IMPRESSION: Tip of right IJ central venous catheter is seen at the junction of superior vena cava and right atrium. There are no signs of pulmonary edema or focal pulmonary consolidation. There is no pleural effusion or pneumothorax. Electronically Signed   By: Elmer Picker M.D.   On: 03/06/2022 13:41   DG C-Arm 1-60 Min-No Report  Result Date: 03/06/2022 Fluoroscopy was utilized by the requesting physician.  No radiographic interpretation.   US RENAL  Result Date: 02/28/2022 CLINICAL DATA:  Acute renal insufficiency EXAM: RENAL / URINARY TRACT ULTRASOUND COMPLETE COMPARISON:  03/09/2021, 06/25/2021 FINDINGS: Right Kidney: Renal measurements: 11.2 x 5.0 x 5.9 cm = volume: 172.5 mL. Increased renal cortical echotexture. There is a benign simple cyst measuring 1.5 cm off the ventral mid aspect of the kidney. No specific imaging follow-up is required. No hydronephrosis or nephrolithiasis. Left Kidney: Renal measurements: 13.8 x 7.7 by 7.9 cm = volume: 440.2 mL. There is left renal cortical thinning, with increased renal cortical echotexture. Multiple cysts are again identified, largest measuring up to 6.4 cm. No hydronephrosis or nephrolithiasis.  Bladder: Appears normal for degree of bladder distention. Other: None. IMPRESSION: 1. Bilateral increased renal cortical echotexture, left renal cortical thinning, consistent with medical renal disease. 2. Bilateral renal cortical cysts, unchanged since prior exam. Electronically Signed   By: Randa Ngo M.D.   On: 02/28/2022 15:24   ECHOCARDIOGRAM COMPLETE  Result Date: 02/28/2022    ECHOCARDIOGRAM REPORT   Patient Name:   Matthew [name] Date of Exam: 02/28/2022 Medical Rec #:  [id number]       Height:       68.0 in Accession #:    [id number]      Weight:       180.0 lb Date of Birth:  1945-08-18       BSA:  1.954 m Patient Age:    40 years        BP:           197/83 mmHg Patient Gender: M               HR:           71 bpm. Exam Location:  [facility] Procedure: 2D Echo, Cardiac Doppler and Color Doppler Indications:    Congestive Heart Failure I50.9  History:        Patient has prior history of Echocardiogram examinations, most                 recent 10/07/2018. Prior CABG; Risk Factors:Hypertension,                 Dyslipidemia and Pre-diabetes. CKD (chronic kidney disease),                 stage III (HCC), Non-ST elevation (NSTEMI) myocardial infarction                 [facility]), s/p AVR.  Sonographer:    Alvino Chapel RCS Referring Phys: [id number] ASIA B Spencer  1. Left ventricular ejection fraction, by estimation, is 65 to 70%. The left ventricle has normal function. The left ventricle has no regional wall motion abnormalities. There is moderate left ventricular hypertrophy. Left ventricular diastolic parameters are indeterminate. Elevated left atrial pressure.  2. Right ventricular systolic function is normal. The right ventricular size is normal.  3. Left atrial size was mildly dilated.  4. Right atrial size was mildly dilated.  5. The mitral valve is abnormal. Mild mitral valve regurgitation. No evidence of mitral stenosis.  6. A 46mm Edwards INSPIRIS RESILIA PERICARDIAL VALVE  bioprosthesis valve     is present in the aortic position. The aortic valve has been repaired/replaced. Aortic valve regurgitation is not visualized. No aortic stenosis is present.  7. The inferior vena cava is normal in size with greater than 50% respiratory variability, suggesting right atrial pressure of 3 mmHg. FINDINGS  Left Ventricle: Left ventricular ejection fraction, by estimation, is 65 to 70%. The left ventricle has normal function. The left ventricle has no regional wall motion abnormalities. The left ventricular internal cavity size was normal in size. There is  moderate left ventricular hypertrophy. Left ventricular diastolic parameters are indeterminate. Elevated left atrial pressure. Right Ventricle: The right ventricular size is normal. Right vetricular wall thickness was not well visualized. Right ventricular systolic function is normal. Left Atrium: Left atrial size was mildly dilated. Right Atrium: Right atrial size was mildly dilated. Pericardium: There is no evidence of pericardial effusion. Mitral Valve: The mitral valve is abnormal. There is mild thickening of the mitral valve leaflet(s). There is mild calcification of the mitral valve leaflet(s). Mild mitral annular calcification. Mild mitral valve regurgitation. No evidence of mitral valve stenosis. Tricuspid Valve: The tricuspid valve is normal in structure. Tricuspid valve regurgitation is trivial. No evidence of tricuspid stenosis. Aortic Valve: A 60mm Edwards INSPIRIS RESILIA PERICARDIAL VALVE bioprosthesis valve is present in the aortic position. The aortic valve has been repaired/replaced. Aortic valve regurgitation is not visualized. No aortic stenosis is present. Aortic valve mean gradient measures 15.5 mmHg. Aortic valve peak gradient measures 31.8 mmHg. Aortic valve area, by VTI measures 1.24 cm. Pulmonic Valve: The pulmonic valve was not well visualized. Pulmonic valve regurgitation is not visualized. No evidence of pulmonic  stenosis. Aorta: The aortic root is normal in size and structure. Venous: The  inferior vena cava is normal in size with greater than 50% respiratory variability, suggesting right atrial pressure of 3 mmHg. IAS/Shunts: No atrial level shunt detected by color flow Doppler.  LEFT VENTRICLE PLAX 2D LVIDd:         4.00 cm   Diastology LVIDs:         2.20 cm   LV e' medial:    5.87 cm/s LV PW:         1.30 cm   LV E/e' medial:  23.0 LV IVS:        1.30 cm   LV e' lateral:   8.38 cm/s LVOT diam:     1.80 cm   LV E/e' lateral: 16.1 LV SV:         78 LV SV Index:   40 LVOT Area:     2.54 cm  RIGHT VENTRICLE RV S prime:     14.50 cm/s TAPSE (M-mode): 2.0 cm LEFT ATRIUM             Index        RIGHT ATRIUM           Index LA diam:        4.00 cm 2.05 cm/m   RA Area:     22.80 cm LA Vol (A2C):   80.5 ml 41.19 ml/m  RA Volume:   76.20 ml  38.99 ml/m LA Vol (A4C):   65.5 ml 33.52 ml/m LA Biplane Vol: 77.9 ml 39.86 ml/m  AORTIC VALVE AV Area (Vmax):    1.06 cm AV Area (Vmean):   1.18 cm AV Area (VTI):     1.24 cm AV Vmax:           282.00 cm/s AV Vmean:          179.000 cm/s AV VTI:            0.634 m AV Peak Grad:      31.8 mmHg AV Mean Grad:      15.5 mmHg LVOT Vmax:         117.00 cm/s LVOT Vmean:        83.100 cm/s LVOT VTI:          0.308 m LVOT/AV VTI ratio: 0.49  AORTA Ao Root diam: 3.40 cm MITRAL VALVE MV Area (PHT): 5.27 cm     SHUNTS MV Decel Time: 144 msec     Systemic VTI:  0.31 m MV E velocity: 135.00 cm/s  Systemic Diam: 1.80 cm MV A velocity: 123.00 cm/s MV E/A ratio:  1.10 Carlyle Dolly MD Electronically signed by Carlyle Dolly MD Signature Date/Time: 02/28/2022/1:33:18 PM    Final    DG Chest 2 View  Result Date: 02/28/2022 CLINICAL DATA:  Shortness of breath. EXAM: CHEST - 2 VIEW COMPARISON:  Chest radiograph dated 08/05/2021. FINDINGS: Trace pleural effusion on the lateral view. No focal consolidation, or pneumothorax. The cardiac silhouette is within limits. Median sternotomy wires and  mechanical cardiac valve. No acute osseous pathology. IMPRESSION: Trace pleural effusions.  No focal consolidation. Electronically Signed   By: Anner Crete M.D.   On: 02/28/2022 01:02    Orson Eva, DO  [facility]  If 7PM-7AM, please contact night-coverage [email or web address] Password [email or web address] 03/08/2022, 4:42 PM   LOS: 8 days

## 2022-03-08 NOTE — Procedures (Signed)
   Nephrology Nursing Note:  Per Dr. Madelon Lips, HD #3 has been re-scheduled to tomorrow, Sunday 11/5, due to high census.     APoteat, RN AP KDU

## 2022-03-08 NOTE — TOC Progression Note (Signed)
Transition of Care [facility]) - Progression Note    Patient Details  Name: Matthew [name] MRN: [id number] Date of Birth: 1945-11-14  [facility] of [facility]) CM/SW Contact  Shade Flood, LCSW Phone Number: 03/08/2022, 2:52 PM  Clinical Narrative:     TOC following. Unable to confirm if pt has chair time assigned yet. Staff at [facility] inform this LCSW that the person who handles that won't be there until Monday and TOC will need to check back then.  Pt will need his SNF auth re-started as well. TOC will follow.  Expected Discharge Plan: [place] Barriers to Discharge: Continued Medical Work up  Expected Discharge Plan and Services Expected Discharge Plan: [place] In-house Referral: Clinical Social Work Discharge Planning Services: CM Consult   Living arrangements for the past 2 months: Single Family Home                                       Social Determinants of Health (SDOH) Interventions    Readmission Risk Interventions    03/03/2022   12:35 PM  Readmission Risk Prevention Plan  Transportation Screening Complete  Home Care Screening Complete  Medication Review (RN CM) Complete

## 2022-03-09 DIAGNOSIS — N179 Acute kidney failure, unspecified: Secondary | ICD-10-CM | POA: Diagnosis not present

## 2022-03-09 DIAGNOSIS — N184 Chronic kidney disease, stage 4 (severe): Secondary | ICD-10-CM | POA: Diagnosis not present

## 2022-03-09 DIAGNOSIS — N186 End stage renal disease: Secondary | ICD-10-CM | POA: Diagnosis not present

## 2022-03-09 DIAGNOSIS — I5033 Acute on chronic diastolic (congestive) heart failure: Secondary | ICD-10-CM | POA: Diagnosis not present

## 2022-03-09 LAB — RENAL FUNCTION PANEL
Albumin: 2.8 g/dL — ABNORMAL LOW (ref 3.5–5.0)
Anion gap: 15 (ref 5–15)
BUN: 71 mg/dL — ABNORMAL HIGH (ref 8–23)
CO2: 24 mmol/L (ref 22–32)
Calcium: 8.8 mg/dL — ABNORMAL LOW (ref 8.9–10.3)
Chloride: 97 mmol/L — ABNORMAL LOW (ref 98–111)
Creatinine, Ser: 5.3 mg/dL — ABNORMAL HIGH (ref 0.61–1.24)
GFR, Estimated: 10 mL/min — ABNORMAL LOW (ref >60)
Glucose, Bld: 96 mg/dL (ref 70–99)
Phosphorus: 7.1 mg/dL — ABNORMAL HIGH (ref 2.5–4.6)
Potassium: 3.4 mmol/L — ABNORMAL LOW (ref 3.5–5.1)
Sodium: 136 mmol/L (ref 135–145)

## 2022-03-09 MED ORDER — CALCIUM CARBONATE 1250 (500 CA) MG PO TABS
1.0000 | ORAL_TABLET | Freq: Every day | ORAL | Status: DC
  Administered 2022-03-10 – 2022-03-11 (×2): 1250 mg via ORAL
  Filled 2022-03-09 (×2): qty 1

## 2022-03-09 MED ORDER — POTASSIUM CHLORIDE CRYS ER 20 MEQ PO TBCR
40.0000 meq | EXTENDED_RELEASE_TABLET | Freq: Once | ORAL | Status: AC
  Administered 2022-03-09: 40 meq via ORAL
  Filled 2022-03-09: qty 2

## 2022-03-09 MED ORDER — FUROSEMIDE 40 MG PO TABS: 40.0000 mg | ORAL_TABLET | Freq: Two times a day (BID) | ORAL | Status: DC

## 2022-03-09 MED ORDER — OXYCODONE HCL 5 MG PO TABS: 5.0000 mg | ORAL_TABLET | Freq: Four times a day (QID) | ORAL | 0 refills | Status: DC | PRN

## 2022-03-09 MED ORDER — HEPARIN SODIUM (PORCINE) 1000 UNIT/ML IJ SOLN
INTRAMUSCULAR | Status: AC
  Filled 2022-03-09: qty 4

## 2022-03-09 MED ORDER — GABAPENTIN 100 MG PO CAPS
100.0000 mg | ORAL_CAPSULE | Freq: Every day | ORAL | Status: DC
  Administered 2022-03-09 – 2022-03-10 (×2): 100 mg via ORAL
  Filled 2022-03-09 (×2): qty 1

## 2022-03-09 NOTE — Procedures (Signed)
   HEMODIALYSIS TREATMENT NOTE (HD#3):   Uneventful 3.5 hour heparin-free treatment completed using RIJ TDC.  Cath dressing was changed; entry site is unremarkable. Goal met: 1 liter removed without interruption in UF.  Hemodynamically stable throughout session.  All blood was returned.  Catheter tolerated prescribed flow with stable pressures.  Requested "pain pill" after HD; primary nurse was notified.  Hand-off given to Trilby Drummer, LPN.   Rockwell Alexandria, RN AP KDU

## 2022-03-09 NOTE — TOC Progression Note (Signed)
Transition of Care [facility]) - Progression Note    Patient Details  Name: Matthew [name] MRN: [id number] Date of Birth: 08/13/1945  Transition of Care [facility]) CM/SW [place], [place] Phone Number: 03/09/2022, 3:15 PM  Clinical Narrative:    CSW started SNF auth via [employer] health portal. TOC to follow.   Expected Discharge Plan: [place] Barriers to Discharge: Continued Medical Work up  Expected Discharge Plan and Services Expected Discharge Plan: [place] In-house Referral: Clinical Social Work Discharge Planning Services: CM Consult   Living arrangements for the past 2 months: Single Family Home                                       Social Determinants of Health (SDOH) Interventions    Readmission Risk Interventions    03/03/2022   12:35 PM  Readmission Risk Prevention Plan  Transportation Screening Complete  Home Care Screening Complete  Medication Review (RN CM) Complete

## 2022-03-09 NOTE — Discharge Summary (Signed)
Physician Discharge Summary   [name] MRN: [id number] DOB: Jul 13, 1945  Admit date:     02/28/2022  Discharge date: 03/11/2022  Discharge Physician: Shanon Brow Jalani Cullifer   PCP: Charlsie Merles, MD   Recommendations at discharge:   Please follow up with primary care provider within 1-2 weeks  Please repeat BMP and CBC in one week  Brief History:  Matthew [name] is a 76 y.o. male with medical history significant of hypertension, hyperlipidemia, history of coronary artery disease status post CABG, status post AVR, chronic kidney disease stage IV, peripheral arterial disease and GERD; who presented to the emergency department secondary to shortness of breath, chest pressure and dyspnea exertion. Patient denies any focal deficits, coughing spells, hematuria, dysuria, melena, hematochezia, fever/chills or any other complaints.   Work-up demonstrating elevated BNP, hypoxia (new to patient) and significant worsening in his renal function.  TR[facility] has been contacted to place patient in the hospital for further evaluation and management.  Nephrology and cardiology service consulted.  He was started in IV lasix with symptomatic improvement.  Unfortunately, his serum creatinine continued to worsen.  After discussions with palliative medicine and the medical team, patient decided to pursue HD.  General surgery was consulted and plans for tunneled HD catheter on 03/06/22. Discharge was delayed due to initiation of HD and arrangement of outpatient HD chair.  Acute respiratory failure with hypoxia -due to pulmonary edema and fluid overload -improving with diuresis -weaned to RA and remains stable on RA   Acute HFpEF -02/28/22 Echo--EF 65-70%, no WMA, normal RVF, bioprosthetic AVR -clinically improving on IV lasix>>po lasix -Diuretics transitioned to oral route 10/31 -daily weight and strict I's and O's -Patient creatinine once diuresis has been initiated has trended up some (creatinine 7.35)    acute kidney injury on chronic renal failure stage IV -baseline creatinine 3.3-3.5 -presented with serum creatinine 5.89 -appreciate nephrology followup and recommendations -Continue strict I's and O's and daily weight -No signs of UTI -Renal ultrasound demonstrating chronic medical renal disease without hydronephrosis or obstruction. -After IV diuresis provided creatinine continues to trend up -Palliative care consulted and after discussion with daughter and patient they are interested in full scope of practice and wish to pursue HD -general surgery consulted>>tunneled HD catheter 11/2 -03/06/22>>first HD session -03/07/22>>second HD -third HD 03/09/22 -HD done on 03/11/22 prior to d/c -pt will be on TTS schedule   hypertension -Stable -Continue Coreg--holding for HD -Also receiving IV diuresis>>now on HD -As needed hydralazine has been ordered for systolic blood pressure above 102 and/or diastolic blood pressure more than 110. -anticipate improving BP with HD>>discontinue amlodipine as BP occasionally soft of on non-HD days   anemia of chronic kidney disease -Given history of coronary disease plan is for transfusion if hemoglobin less than 7.5 -Fecal occult blood test at time of admission was positive; stopping heparin products and using SCDs for DVT prophylaxis -Continue monitoring hemoglobin trend -IV iron and Epogen as per nephrology discretion. -Last hemoglobin level 8.9 -No signs of overt bleeding appreciated. -Hgb has remained stable for the hospitalization   coronary artery disease -no chest pain -Continue aspirin and statin -Patient denies chest pain. -2D echo--EF 65-70%, no WMA, normal RVF   hyperlipidemia -Continue statin -Heart healthy diet discussed with patient.   gout -No acute flare currently -Continue Uloric   GERD -Continue Pepcid   stage II pressure injury -Localizing his coccyx; no signs of superimposed infection -Present at time of  admission -Continue constant repositioning and preventive measures.  Epistaxis -had 10 min episode 11/3 -stopped with Afrin and pressure -Hgb stable           Pain control - [place] Controlled Substance Reporting System database was reviewed. and patient was instructed, not to drive, operate heavy machinery, perform activities at heights, swimming or participation in water activities or provide baby-sitting services while on Pain, Sleep and Anxiety Medications; until their outpatient Physician has advised to do so again. Also recommended to not to take more than prescribed Pain, Sleep and Anxiety Medications.  Consultants: renal, palliative Procedures performed: tunneled HD catheter 03/06/22 Disposition: Skilled nursing facility Diet recommendation:  Renal diet DISCHARGE MEDICATION: Allergies as of 03/11/2022   No Known Allergies      Medication List     STOP taking these medications    amLODipine 10 MG tablet Commonly known as: NORVASC   cloNIDine 0.1 MG tablet Commonly known as: CATAPRES   methocarbamol 750 MG tablet Commonly known as: ROBAXIN   metoprolol tartrate 100 MG tablet Commonly known as: LOPRESSOR   NIFEdipine 60 MG 24 hr tablet Commonly known as: ADALAT CC   nitroGLYCERIN 0.4 MG SL tablet Commonly known as: NITROSTAT   oxybutynin 5 MG tablet Commonly known as: DITROPAN       TAKE these medications    acetaminophen 325 MG tablet Commonly known as: TYLENOL Take 2 tablets (650 mg total) by mouth every 6 (six) hours as needed for mild pain.   aspirin EC 81 MG tablet Take 81 mg by mouth daily.   atorvastatin 80 MG tablet Commonly known as: LIPITOR Take 1 tablet (80 mg total) by mouth daily at 6 PM.   carvedilol 25 MG tablet Commonly known as: COREG Take 1 tablet (25 mg total) by mouth 2 (two) times daily.   cholecalciferol 25 MCG (1000 UNIT) tablet Commonly known as: VITAMIN D3 Take 1,000 Units by mouth daily.   famotidine 20  MG tablet Commonly known as: PEPCID Take 20 mg by mouth daily.   febuxostat 40 MG tablet Commonly known as: ULORIC Take 40 mg by mouth daily.   ferrous sulfate 325 (65 FE) MG tablet Take 325 mg by mouth every Monday, Wednesday, and Friday.   furosemide 40 MG tablet Commonly known as: LASIX Take 1 tablet (40 mg total) by mouth 2 (two) times daily. What changed:  medication strength how much to take how to take this when to take this additional instructions   gabapentin 600 MG tablet Commonly known as: NEURONTIN Take 300 mg by mouth daily.   Omega-3 Fish Oil 300 MG Caps Take 300 mg by mouth 2 (two) times daily.   oxyCODONE 5 MG immediate release tablet Commonly known as: Oxy IR/ROXICODONE Take 1 tablet (5 mg total) by mouth every 6 (six) hours as needed for severe pain.        Contact information for after-discharge care     [facility] .   Service: Skilled Nursing Contact information: [address] [place] [phone]                    Discharge Exam: Danley Danker Weights   03/09/22 1135 03/09/22 1535 03/11/22 0500  Weight: 78.6 kg 77.5 kg 76.4 kg   HEENT:  [place]/AT, No thrush, no icterus CV:  RRR, no rub, no S3, no S4 Lung:  CTA, no wheeze, no rhonchi Abd:  soft/+BS, NT Ext:  No edema, no lymphangitis, no  synovitis, no rash   Condition at discharge: stable  The results of significant diagnostics from this hospitalization (including imaging, microbiology, ancillary and laboratory) are listed below for reference.   Imaging Studies: DG Chest Port 1 View  Result Date: 03/06/2022 CLINICAL DATA:  Placement of dialysis catheter EXAM: PORTABLE CHEST 1 VIEW COMPARISON:  Previous studies including the examination done on 02/28/2022 FINDINGS: Transverse diameter of heart is slightly increased. There are no signs of pulmonary edema or focal pulmonary consolidation. There  is previous coronary bypass surgery. There is no pleural effusion or pneumothorax. There is interval placement of right IJ central venous catheter with its tip in the right atrium. IMPRESSION: Tip of right IJ central venous catheter is seen at the junction of superior vena cava and right atrium. There are no signs of pulmonary edema or focal pulmonary consolidation. There is no pleural effusion or pneumothorax. Electronically Signed   By: Elmer Picker M.D.   On: 03/06/2022 13:41   DG C-Arm 1-60 Min-No Report  Result Date: 03/06/2022 Fluoroscopy was utilized by the requesting physician.  No radiographic interpretation.   US RENAL  Result Date: 02/28/2022 CLINICAL DATA:  Acute renal insufficiency EXAM: RENAL / URINARY TRACT ULTRASOUND COMPLETE COMPARISON:  03/09/2021, 06/25/2021 FINDINGS: Right Kidney: Renal measurements: 11.2 x 5.0 x 5.9 cm = volume: 172.5 mL. Increased renal cortical echotexture. There is a benign simple cyst measuring 1.5 cm off the ventral mid aspect of the kidney. No specific imaging follow-up is required. No hydronephrosis or nephrolithiasis. Left Kidney: Renal measurements: 13.8 x 7.7 by 7.9 cm = volume: 440.2 mL. There is left renal cortical thinning, with increased renal cortical echotexture. Multiple cysts are again identified, largest measuring up to 6.4 cm. No hydronephrosis or nephrolithiasis. Bladder: Appears normal for degree of bladder distention. Other: None. IMPRESSION: 1. Bilateral increased renal cortical echotexture, left renal cortical thinning, consistent with medical renal disease. 2. Bilateral renal cortical cysts, unchanged since prior exam. Electronically Signed   By: Randa Ngo M.D.   On: 02/28/2022 15:24   ECHOCARDIOGRAM COMPLETE  Result Date: 02/28/2022    ECHOCARDIOGRAM REPORT   Patient Name:   Matthew [name] Date of Exam: 02/28/2022 Medical Rec #:  [id number]       Height:       68.0 in Accession #:    [id number]      Weight:       180.0 lb Date  of Birth:  08-01-1945       BSA:          1.954 m Patient Age:    29 years        BP:           197/83 mmHg Patient Gender: M               HR:           71 bpm. Exam Location:  [facility] Procedure: 2D Echo, Cardiac Doppler and Color Doppler Indications:    Congestive Heart Failure I50.9  History:        Patient has prior history of Echocardiogram examinations, most                 recent 10/07/2018. Prior CABG; Risk Factors:Hypertension,                 Dyslipidemia and Pre-diabetes. CKD (chronic kidney disease),                 stage III (HCC), Non-ST elevation (NSTEMI)  myocardial infarction                 [facility]), s/p AVR.  Sonographer:    Alvino Chapel RCS Referring Phys: [id number] ASIA B [place]  1. Left ventricular ejection fraction, by estimation, is 65 to 70%. The left ventricle has normal function. The left ventricle has no regional wall motion abnormalities. There is moderate left ventricular hypertrophy. Left ventricular diastolic parameters are indeterminate. Elevated left atrial pressure.  2. Right ventricular systolic function is normal. The right ventricular size is normal.  3. Left atrial size was mildly dilated.  4. Right atrial size was mildly dilated.  5. The mitral valve is abnormal. Mild mitral valve regurgitation. No evidence of mitral stenosis.  6. A 53mm Edwards INSPIRIS RESILIA PERICARDIAL VALVE bioprosthesis valve     is present in the aortic position. The aortic valve has been repaired/replaced. Aortic valve regurgitation is not visualized. No aortic stenosis is present.  7. The inferior vena cava is normal in size with greater than 50% respiratory variability, suggesting right atrial pressure of 3 mmHg. FINDINGS  Left Ventricle: Left ventricular ejection fraction, by estimation, is 65 to 70%. The left ventricle has normal function. The left ventricle has no regional wall motion abnormalities. The left ventricular internal cavity size was normal in size. There is  moderate  left ventricular hypertrophy. Left ventricular diastolic parameters are indeterminate. Elevated left atrial pressure. Right Ventricle: The right ventricular size is normal. Right vetricular wall thickness was not well visualized. Right ventricular systolic function is normal. Left Atrium: Left atrial size was mildly dilated. Right Atrium: Right atrial size was mildly dilated. Pericardium: There is no evidence of pericardial effusion. Mitral Valve: The mitral valve is abnormal. There is mild thickening of the mitral valve leaflet(s). There is mild calcification of the mitral valve leaflet(s). Mild mitral annular calcification. Mild mitral valve regurgitation. No evidence of mitral valve stenosis. Tricuspid Valve: The tricuspid valve is normal in structure. Tricuspid valve regurgitation is trivial. No evidence of tricuspid stenosis. Aortic Valve: A 68mm Edwards INSPIRIS RESILIA PERICARDIAL VALVE bioprosthesis valve is present in the aortic position. The aortic valve has been repaired/replaced. Aortic valve regurgitation is not visualized. No aortic stenosis is present. Aortic valve mean gradient measures 15.5 mmHg. Aortic valve peak gradient measures 31.8 mmHg. Aortic valve area, by VTI measures 1.24 cm. Pulmonic Valve: The pulmonic valve was not well visualized. Pulmonic valve regurgitation is not visualized. No evidence of pulmonic stenosis. Aorta: The aortic root is normal in size and structure. Venous: The inferior vena cava is normal in size with greater than 50% respiratory variability, suggesting right atrial pressure of 3 mmHg. IAS/Shunts: No atrial level shunt detected by color flow Doppler.  LEFT VENTRICLE PLAX 2D LVIDd:         4.00 cm   Diastology LVIDs:         2.20 cm   LV e' medial:    5.87 cm/s LV PW:         1.30 cm   LV E/e' medial:  23.0 LV IVS:        1.30 cm   LV e' lateral:   8.38 cm/s LVOT diam:     1.80 cm   LV E/e' lateral: 16.1 LV SV:         78 LV SV Index:   40 LVOT Area:     2.54 cm   RIGHT VENTRICLE RV S prime:     14.50 cm/s TAPSE (M-mode): 2.0 cm  LEFT ATRIUM             Index        RIGHT ATRIUM           Index LA diam:        4.00 cm 2.05 cm/m   RA Area:     22.80 cm LA Vol (A2C):   80.5 ml 41.19 ml/m  RA Volume:   76.20 ml  38.99 ml/m LA Vol (A4C):   65.5 ml 33.52 ml/m LA Biplane Vol: 77.9 ml 39.86 ml/m  AORTIC VALVE AV Area (Vmax):    1.06 cm AV Area (Vmean):   1.18 cm AV Area (VTI):     1.24 cm AV Vmax:           282.00 cm/s AV Vmean:          179.000 cm/s AV VTI:            0.634 m AV Peak Grad:      31.8 mmHg AV Mean Grad:      15.5 mmHg LVOT Vmax:         117.00 cm/s LVOT Vmean:        83.100 cm/s LVOT VTI:          0.308 m LVOT/AV VTI ratio: 0.49  AORTA Ao Root diam: 3.40 cm MITRAL VALVE MV Area (PHT): 5.27 cm     SHUNTS MV Decel Time: 144 msec     Systemic VTI:  0.31 m MV E velocity: 135.00 cm/s  Systemic Diam: 1.80 cm MV A velocity: 123.00 cm/s MV E/A ratio:  1.10 Carlyle Dolly MD Electronically signed by Carlyle Dolly MD Signature Date/Time: 02/28/2022/1:33:18 PM    Final    DG Chest 2 View  Result Date: 02/28/2022 CLINICAL DATA:  Shortness of breath. EXAM: CHEST - 2 VIEW COMPARISON:  Chest radiograph dated 08/05/2021. FINDINGS: Trace pleural effusion on the lateral view. No focal consolidation, or pneumothorax. The cardiac silhouette is within limits. Median sternotomy wires and mechanical cardiac valve. No acute osseous pathology. IMPRESSION: Trace pleural effusions.  No focal consolidation. Electronically Signed   By: Anner Crete M.D.   On: 02/28/2022 01:02    Microbiology: Results for orders placed or performed during the hospital encounter of 02/28/22  Resp Panel by RT-PCR (Flu A&B, Covid) Anterior Nasal Swab     Status: None   Collection Time: 02/28/22 12:25 AM   Specimen: Anterior Nasal Swab  Result Value Ref Range Status   SARS Coronavirus 2 by RT PCR NEGATIVE NEGATIVE Final    Comment: (NOTE) SARS-CoV-2 target nucleic acids are NOT  DETECTED.  The SARS-CoV-2 RNA is generally detectable in upper respiratory specimens during the acute phase of infection. The lowest concentration of SARS-CoV-2 viral copies this assay can detect is 138 copies/mL. A negative result does not preclude SARS-Cov-2 infection and should not be used as the sole basis for treatment or other patient management decisions. A negative result may occur with  improper specimen collection/handling, submission of specimen other than nasopharyngeal swab, presence of viral mutation(s) within the areas targeted by this assay, and inadequate number of viral copies(<138 copies/mL). A negative result must be combined with clinical observations, patient history, and epidemiological information. The expected result is Negative.  Fact Sheet for Patients:  EntrepreneurPulse.com.au  Fact Sheet for Healthcare Providers:  IncredibleEmployment.be  This test is no t yet approved or cleared by the [place] FDA and  has been authorized for detection and/or diagnosis of SARS-CoV-2 by FDA  under an Emergency Use Authorization (EUA). This EUA will remain  in effect (meaning this test can be used) for the duration of the COVID-19 declaration under Section 564(b)(1) of the Act, 21 U.S.C.section 360bbb-3(b)(1), unless the authorization is terminated  or revoked sooner.       Influenza A by PCR NEGATIVE NEGATIVE Final   Influenza B by PCR NEGATIVE NEGATIVE Final    Comment: (NOTE) The Xpert Xpress SARS-CoV-2/FLU/RSV plus assay is intended as an aid in the diagnosis of influenza from Nasopharyngeal swab specimens and should not be used as a sole basis for treatment. Nasal washings and aspirates are unacceptable for Xpert Xpress SARS-CoV-2/FLU/RSV testing.  Fact Sheet for Patients: EntrepreneurPulse.com.au  Fact Sheet for Healthcare Providers: IncredibleEmployment.be  This test is not yet  approved or cleared by the [place] FDA and has been authorized for detection and/or diagnosis of SARS-CoV-2 by FDA under an Emergency Use Authorization (EUA). This EUA will remain in effect (meaning this test can be used) for the duration of the COVID-19 declaration under Section 564(b)(1) of the Act, 21 U.S.C. section 360bbb-3(b)(1), unless the authorization is terminated or revoked.  Performed at [facility], [address] [place], [place] [address]   Urine Culture     Status: None   Collection Time: 02/28/22  1:12 AM   Specimen: Urine, Clean Catch  Result Value Ref Range Status   Specimen Description   Final    URINE, CLEAN CATCH Performed at [facility], [address], [place], [place] [address]    Special Requests   Final    NONE Performed at [facility], [address] [place], [place] [address]    Culture   Final    NO GROWTH Performed at [place] [facility], [place] [address], [place], [place] [address]    Report Status 03/01/2022 FINAL  Final  MRSA Next Gen by PCR, Nasal     Status: None   Collection Time: 02/28/22 12:01 PM   Specimen: Nasal Mucosa; Nasal Swab  Result Value Ref Range Status   MRSA by PCR Next Gen NOT DETECTED NOT DETECTED Final    Comment: (NOTE) The GeneXpert MRSA Assay (FDA approved for NASAL specimens only), is one component of a comprehensive MRSA colonization surveillance program. It is not intended to diagnose MRSA infection nor to guide or monitor treatment for MRSA infections. Test performance is not FDA approved in patients less than 87 years old. Performed at [facility], [address] [place], [place] [address]   Surgical pcr screen     Status: None   Collection Time: 03/05/22  3:46 PM   Specimen: Nasal Mucosa; Nasal Swab  Result Value Ref Range Status   MRSA, PCR NEGATIVE NEGATIVE Final   Staphylococcus aureus NEGATIVE NEGATIVE Final    Comment: (NOTE) The Xpert SA Assay (FDA approved for NASAL specimens in patients  13 years of age and older), is one component of a comprehensive surveillance program. It is not intended to diagnose infection nor to guide or monitor treatment. Performed at [facility], [address] [place],  [address]     Labs: CBC: Recent Labs  Lab 03/05/22 0451 03/07/22 1445 03/08/22 0342  WBC 10.8* 11.9* 10.8*  HGB 8.2* 9.7* 8.9*  HCT 25.5* 29.8* 27.3*  MCV 95.1 93.4 94.5  PLT 227 361 417   Basic Metabolic Panel: Recent Labs  Lab 03/05/22 1001 03/07/22 1410 03/08/22 0342 03/09/22 0332 03/10/22 0400  NA 133* 134* 136 136 135  K 4.1  3.8 3.2* 3.4* 4.0  CL 101 96* 95* 97* 97*  CO2 18* 21* 27 24 25   GLUCOSE 137* 149* 102* 96 116*  BUN 96* 90* 54* 71* 46*  CREATININE 7.35* 6.25* 4.38* 5.30* 4.19*  CALCIUM 8.2* 8.7* 8.7* 8.8* 8.8*  PHOS 8.3* 7.5* 5.7* 7.1* 5.2*   Liver Function Tests: Recent Labs  Lab 03/05/22 1001 03/07/22 1410 03/08/22 0342 03/09/22 0332 03/10/22 0400  ALBUMIN 2.7* 3.0* 2.7* 2.8* 2.8*   CBG: No results for input(s): "GLUCAP" in the last 168 hours.  Discharge time spent: greater than 30 minutes.  Signed: Orson Eva, MD [facility] 03/11/2022

## 2022-03-09 NOTE — Progress Notes (Signed)
PROGRESS NOTE  ROBERTSON COLCLOUGH YHC:[id number] DOB: 22-Jun-1945 DOA: 02/28/2022 PCP: Charlsie Merles, MD    Brief History:  Matthew [name] is a 76 y.o. male with medical history significant of hypertension, hyperlipidemia, history of coronary artery disease status post CABG, status post AVR, chronic kidney disease stage IV, peripheral arterial disease and GERD; who presented to the emergency department secondary to shortness of breath, chest pressure and dyspnea exertion. Patient denies any focal deficits, coughing spells, hematuria, dysuria, melena, hematochezia, fever/chills or any other complaints.   Work-up demonstrating elevated BNP, hypoxia (new to patient) and significant worsening in his renal function.  [facility] has been contacted to place patient in the hospital for further evaluation and management.  Nephrology and cardiology service consulted.  He was started in IV lasix with symptomatic improvement.  Unfortunately, his serum creatinine continued to worsen.  After discussions with palliative medicine and the medical team, patient decided to pursue HD.  General surgery was consulted and plans for tunneled HD catheter on 03/06/22.   Assessment/Plan: Acute respiratory failure with hypoxia -due to pulmonary edema and fluid overload -improving with diuresis -weaned to RA   Acute HFpEF -02/28/22 Echo--EF 65-70%, no WMA, normal RVF, bioprosthetic AVR -clinically improving on IV lasix>>po lasix -Diuretics transitioned to oral route 10/31 -daily weight and strict I's and O's -Patient creatinine once diuresis has been initiated has trended up some (creatinine 7.35)   acute kidney injury on chronic renal failure stage IV -baseline creatinine 3.3-3.5 -presented with serum creatinine 5.89 -appreciate nephrology followup and recommendations -Continue strict I's and O's and daily weight -No signs of UTI -Renal ultrasound demonstrating chronic medical renal disease without  hydronephrosis or obstruction. -After IV diuresis provided creatinine continues to trend up -Palliative care consulted and after discussion with daughter and patient they are interested in full scope of practice and wish to pursue HD -general surgery consulted>>tunneled HD catheter 11/2 -03/06/22>>first HD session -03/07/22>>second HD -plan third HD 11/5   hypertension -Stable -Continue amlodipine and Coreg--holding for HD -Also receiving IV diuresis>>now on HD -As needed hydralazine has been ordered for systolic blood pressure above 517 and/or diastolic blood pressure more than 110. -anticipate improving BP with HD   anemia of chronic kidney disease -Given history of coronary disease plan is for transfusion if hemoglobin less than 7.5 -Fecal occult blood test at time of admission was positive; stopping heparin products and using SCDs for DVT prophylaxis -Continue monitoring hemoglobin trend -IV iron and Epogen as per nephrology discretion. -Last hemoglobin level 9.3 -No signs of overt bleeding appreciated. -Hgb has remained stable for the hospitalization   coronary artery disease -no chest pain -Continue aspirin and statin -Patient denies chest pain. -2D echo--EF 65-70%, no WMA, normal RVF   hyperlipidemia -Continue statin -Heart healthy diet discussed with patient.   gout -No acute flare currently -Continue Uloric   GERD -Continue Pepcid   stage II pressure injury -Localizing his coccyx; no signs of superimposed infection -Present at time of admission -Continue constant repositioning and preventive measures.   Epistaxis -had 10 min episode 11/3 -stopped with Afrin and pressure -Hgb stable     Family Communication:   daughter updated 11/3   Consultants:  renal, general surgery, palliative   Code Status:  FULL   DVT Prophylaxis:  SCDs     Procedures: As Listed in Progress Note Above   Antibiotics: None    Subjective: Patient denies fevers, chills,  headache, chest pain,  dyspnea, nausea, vomiting, diarrhea, abdominal pain, dysuria, hematuria, hematochezia, and melena.   Objective: Vitals:   03/09/22 1430 03/09/22 1500 03/09/22 1515 03/09/22 1535  BP: 130/77 128/88 130/84 136/89  Pulse:    90  Resp: 16 18 18 20   Temp:    98.2 F (36.8 C)  TempSrc:    Oral  SpO2:    98%  Weight:    77.5 kg  Height:        Intake/Output Summary (Last 24 hours) at 03/09/2022 1646 Last data filed at 03/09/2022 0700 Gross per 24 hour  Intake 240 ml  Output 500 ml  Net -260 ml   Weight change: -1.7 kg Exam:  General:  Pt is alert, follows commands appropriately, not in acute distress HEENT: No icterus, No thrush, No neck mass, [place]/AT Cardiovascular: RRR, S1/S2, no rubs, no gallops Respiratory: CTA bilaterally, no wheezing, no crackles, no rhonchi Abdomen: Soft/+BS, non tender, non distended, no guarding Extremities: No edema, No lymphangitis, No petechiae, No rashes, no synovitis   Data Reviewed: I have personally reviewed following labs and imaging studies Basic Metabolic Panel: Recent Labs  Lab 03/05/22 0451 03/05/22 1001 03/07/22 1410 03/08/22 0342 03/09/22 0332  NA 134* 133* 134* 136 136  K 4.0 4.1 3.8 3.2* 3.4*  CL 101 101 96* 95* 97*  CO2 19* 18* 21* 27 24  GLUCOSE 109* 137* 149* 102* 96  BUN 93* 96* 90* 54* 71*  CREATININE 7.12* 7.35* 6.25* 4.38* 5.30*  CALCIUM 8.4* 8.2* 8.7* 8.7* 8.8*  PHOS 8.0* 8.3* 7.5* 5.7* 7.1*   Liver Function Tests: Recent Labs  Lab 03/05/22 0451 03/05/22 1001 03/07/22 1410 03/08/22 0342 03/09/22 0332  ALBUMIN 2.8* 2.7* 3.0* 2.7* 2.8*   No results for input(s): "LIPASE", "AMYLASE" in the last 168 hours. No results for input(s): "AMMONIA" in the last 168 hours. Coagulation Profile: No results for input(s): "INR", "PROTIME" in the last 168 hours. CBC: Recent Labs  Lab 03/05/22 0451 03/07/22 1445 03/08/22 0342  WBC 10.8* 11.9* 10.8*  HGB 8.2* 9.7* 8.9*  HCT 25.5* 29.8* 27.3*  MCV  95.1 93.4 94.5  PLT 227 361 302   Cardiac Enzymes: No results for input(s): "CKTOTAL", "CKMB", "CKMBINDEX", "TROPONINI" in the last 168 hours. BNP: Invalid input(s): "POCBNP" CBG: No results for input(s): "GLUCAP" in the last 168 hours. HbA1C: No results for input(s): "HGBA1C" in the last 72 hours. Urine analysis:    Component Value Date/Time   COLORURINE YELLOW 02/28/2022 0112   APPEARANCEUR CLEAR 02/28/2022 0112   LABSPEC 1.013 02/28/2022 0112   PHURINE 5.0 02/28/2022 0112   GLUCOSEU NEGATIVE 02/28/2022 0112   HGBUR NEGATIVE 02/28/2022 0112   BILIRUBINUR NEGATIVE 02/28/2022 0112   KETONESUR NEGATIVE 02/28/2022 0112   PROTEINUR >=300 (A) 02/28/2022 0112   NITRITE NEGATIVE 02/28/2022 0112   LEUKOCYTESUR SMALL (A) 02/28/2022 0112   Sepsis Labs: @LABRCNTIP (procalcitonin:4,lacticidven:4) ) Recent Results (from the past 240 hour(s))  Resp Panel by RT-PCR (Flu A&B, Covid) Anterior Nasal Swab     Status: None   Collection Time: 02/28/22 12:25 AM   Specimen: Anterior Nasal Swab  Result Value Ref Range Status   SARS Coronavirus 2 by RT PCR NEGATIVE NEGATIVE Final    Comment: (NOTE) SARS-CoV-2 target nucleic acids are NOT DETECTED.  The SARS-CoV-2 RNA is generally detectable in upper respiratory specimens during the acute phase of infection. The lowest concentration of SARS-CoV-2 viral copies this assay can detect is 138 copies/mL. A negative result does not preclude SARS-Cov-2 infection and should not be  used as the sole basis for treatment or other patient management decisions. A negative result may occur with  improper specimen collection/handling, submission of specimen other than nasopharyngeal swab, presence of viral mutation(s) within the areas targeted by this assay, and inadequate number of viral copies(<138 copies/mL). A negative result must be combined with clinical observations, patient history, and epidemiological information. The expected result is  Negative.  Fact Sheet for Patients:  EntrepreneurPulse.com.au  Fact Sheet for Healthcare Providers:  IncredibleEmployment.be  This test is no t yet approved or cleared by the [place] FDA and  has been authorized for detection and/or diagnosis of SARS-CoV-2 by FDA under an Emergency Use Authorization (EUA). This EUA will remain  in effect (meaning this test can be used) for the duration of the COVID-19 declaration under Section 564(b)(1) of the Act, 21 U.S.C.section 360bbb-3(b)(1), unless the authorization is terminated  or revoked sooner.       Influenza A by PCR NEGATIVE NEGATIVE Final   Influenza B by PCR NEGATIVE NEGATIVE Final    Comment: (NOTE) The Xpert Xpress SARS-CoV-2/FLU/RSV plus assay is intended as an aid in the diagnosis of influenza from Nasopharyngeal swab specimens and should not be used as a sole basis for treatment. Nasal washings and aspirates are unacceptable for Xpert Xpress SARS-CoV-2/FLU/RSV testing.  Fact Sheet for Patients: EntrepreneurPulse.com.au  Fact Sheet for Healthcare Providers: IncredibleEmployment.be  This test is not yet approved or cleared by the [place] FDA and has been authorized for detection and/or diagnosis of SARS-CoV-2 by FDA under an Emergency Use Authorization (EUA). This EUA will remain in effect (meaning this test can be used) for the duration of the COVID-19 declaration under Section 564(b)(1) of the Act, 21 U.S.C. section 360bbb-3(b)(1), unless the authorization is terminated or revoked.  Performed at [facility], [address] [place], [place] [address]   Urine Culture     Status: None   Collection Time: 02/28/22  1:12 AM   Specimen: Urine, Clean Catch  Result Value Ref Range Status   Specimen Description   Final    URINE, CLEAN CATCH Performed at [facility], [address] [place], [place] [address]    Special Requests   Final     NONE Performed at [facility], [address], [place], [place] [address]    Culture   Final    NO GROWTH Performed at [facility], [address], [place], [place] [address]    Report Status 03/01/2022 FINAL  Final  MRSA Next Gen by PCR, Nasal     Status: None   Collection Time: 02/28/22 12:01 PM   Specimen: Nasal Mucosa; Nasal Swab  Result Value Ref Range Status   MRSA by PCR Next Gen NOT DETECTED NOT DETECTED Final    Comment: (NOTE) The GeneXpert MRSA Assay (FDA approved for NASAL specimens only), is one component of a comprehensive MRSA colonization surveillance program. It is not intended to diagnose MRSA infection nor to guide or monitor treatment for MRSA infections. Test performance is not FDA approved in patients less than 62 years old. Performed at [facility], [address], [place], [place] [address]   Surgical pcr screen     Status: None   Collection Time: 03/05/22  3:46 PM   Specimen: Nasal Mucosa; Nasal Swab  Result Value Ref Range Status   MRSA, PCR NEGATIVE NEGATIVE Final   Staphylococcus aureus NEGATIVE NEGATIVE Final    Comment: (NOTE) The Xpert SA Assay (FDA approved for NASAL specimens in  patients 46 years of age and older), is one component of a comprehensive surveillance program. It is not intended to diagnose infection nor to guide or monitor treatment. Performed at [facility], [address], [place], [place] [address]      Scheduled Meds:  amLODipine  10 mg Oral q morning   aspirin EC  81 mg Oral Daily   atorvastatin  80 mg Oral q1800   carvedilol  25 mg Oral BID   Chlorhexidine Gluconate Cloth  6 each Topical Q0600   Chlorhexidine Gluconate Cloth  6 each Topical Once   darbepoetin (ARANESP) injection - NON-DIALYSIS  100 mcg Subcutaneous Q Mon-1800   diclofenac Sodium  2 g Topical QID   famotidine  10 mg Oral Daily   febuxostat  40 mg Oral Daily   furosemide  40 mg Oral BID   heparin sodium (porcine)       oxymetazoline  3 spray  Each Nare BID   sodium chloride flush  3 mL Intravenous Q12H   Continuous Infusions:  sodium chloride 10 mL/hr at 03/06/22 1241   anticoagulant sodium citrate      Procedures/Studies: DG Chest Port 1 View  Result Date: 03/06/2022 CLINICAL DATA:  Placement of dialysis catheter EXAM: PORTABLE CHEST 1 VIEW COMPARISON:  Previous studies including the examination done on 02/28/2022 FINDINGS: Transverse diameter of heart is slightly increased. There are no signs of pulmonary edema or focal pulmonary consolidation. 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 There is no pleural effusion or pneumothorax. There is interval placement of right IJ central venous catheter with its tip in the right atrium. IMPRESSION: Tip of right IJ central venous catheter is seen at the junction of superior vena cava and right atrium. There are no signs of pulmonary edema or focal pulmonary consolidation. There is no pleural effusion or pneumothorax. Electronically Signed   By: Elmer Picker M.D.   On: 03/06/2022 13:41   DG C-Arm 1-60 Min-No Report  Result Date: 03/06/2022 Fluoroscopy was utilized by the requesting physician.  No radiographic interpretation.   US RENAL  Result Date: 02/28/2022 CLINICAL DATA:  Acute renal insufficiency EXAM: RENAL / URINARY TRACT ULTRASOUND COMPLETE COMPARISON:  03/09/2021, 06/25/2021 FINDINGS: Right Kidney: Renal measurements: 11.2 x 5.0 x 5.9 cm = volume: 172.5 mL. Increased renal cortical echotexture. There is a benign simple cyst measuring 1.5 cm off the ventral mid aspect of the kidney. No specific imaging follow-up is required. No hydronephrosis or nephrolithiasis. Left Kidney: Renal measurements: 13.8 x 7.7 by 7.9 cm = volume: 440.2 mL. There is left renal cortical thinning, with increased renal cortical echotexture. Multiple cysts are again identified, largest measuring up to 6.4 cm. No hydronephrosis or nephrolithiasis. Bladder: Appears normal for degree of bladder distention.  Other: None. IMPRESSION: 1. Bilateral increased renal cortical echotexture, left renal cortical thinning, consistent with medical renal disease. 2. Bilateral renal cortical cysts, unchanged since prior exam. Electronically Signed   By: Randa Ngo M.D.   On: 02/28/2022 15:24   ECHOCARDIOGRAM COMPLETE  Result Date: 02/28/2022    ECHOCARDIOGRAM REPORT   Patient Name:   Matthew [name] Date of Exam: 02/28/2022 Medical Rec #:  [id number]       Height:       68.0 in Accession #:    [id number]      Weight:       180.0 lb Date of Birth:  [birth year]       BSA:          1.954  m Patient Age:    72 years        BP:           197/83 mmHg Patient Gender: M               HR:           71 bpm. Exam Location:  [facility] Procedure: 2D Echo, Cardiac Doppler and Color Doppler Indications:    Congestive Heart Failure I50.9  History:        Patient has prior history of Echocardiogram examinations, most                 recent 10/07/2018. Prior CABG; Risk Factors:Hypertension,                 Dyslipidemia and Pre-diabetes. CKD (chronic kidney disease),                 stage III (HCC), Non-ST elevation (NSTEMI) myocardial infarction                 [facility]), s/p AVR.  Sonographer:    Alvino Chapel RCS Referring Phys: [id number] ASIA B [facility]  1. Left ventricular ejection fraction, by estimation, is 65 to 70%. The left ventricle has normal function. The left ventricle has no regional wall motion abnormalities. There is moderate left ventricular hypertrophy. Left ventricular diastolic parameters are indeterminate. Elevated left atrial pressure.  2. Right ventricular systolic function is normal. The right ventricular size is normal.  3. Left atrial size was mildly dilated.  4. Right atrial size was mildly dilated.  5. The mitral valve is abnormal. Mild mitral valve regurgitation. No evidence of mitral stenosis.  6. A 56mm Edwards INSPIRIS RESILIA PERICARDIAL VALVE bioprosthesis valve     is present in the aortic position.  The aortic valve has been repaired/replaced. Aortic valve regurgitation is not visualized. No aortic stenosis is present.  7. The inferior vena cava is normal in size with greater than 50% respiratory variability, suggesting right atrial pressure of 3 mmHg. FINDINGS  Left Ventricle: Left ventricular ejection fraction, by estimation, is 65 to 70%. The left ventricle has normal function. The left ventricle has no regional wall motion abnormalities. The left ventricular internal cavity size was normal in size. There is  moderate left ventricular hypertrophy. Left ventricular diastolic parameters are indeterminate. Elevated left atrial pressure. Right Ventricle: The right ventricular size is normal. Right vetricular wall thickness was not well visualized. Right ventricular systolic function is normal. Left Atrium: Left atrial size was mildly dilated. Right Atrium: Right atrial size was mildly dilated. Pericardium: There is no evidence of pericardial effusion. Mitral Valve: The mitral valve is abnormal. There is mild thickening of the mitral valve leaflet(s). There is mild calcification of the mitral valve leaflet(s). Mild mitral annular calcification. Mild mitral valve regurgitation. No evidence of mitral valve stenosis. Tricuspid Valve: The tricuspid valve is normal in structure. Tricuspid valve regurgitation is trivial. No evidence of tricuspid stenosis. Aortic Valve: A 24mm Edwards INSPIRIS RESILIA PERICARDIAL VALVE bioprosthesis valve is present in the aortic position. The aortic valve has been repaired/replaced. Aortic valve regurgitation is not visualized. No aortic stenosis is present. Aortic valve mean gradient measures 15.5 mmHg. Aortic valve peak gradient measures 31.8 mmHg. Aortic valve area, by VTI measures 1.24 cm. Pulmonic Valve: The pulmonic valve was not well visualized. Pulmonic valve regurgitation is not visualized. No evidence of pulmonic stenosis. Aorta: The aortic root is normal in size and  structure. Venous: The inferior  vena cava is normal in size with greater than 50% respiratory variability, suggesting right atrial pressure of 3 mmHg. IAS/Shunts: No atrial level shunt detected by color flow Doppler.  LEFT VENTRICLE PLAX 2D LVIDd:         4.00 cm   Diastology LVIDs:         2.20 cm   LV e' medial:    5.87 cm/s LV PW:         1.30 cm   LV E/e' medial:  23.0 LV IVS:        1.30 cm   LV e' lateral:   8.38 cm/s LVOT diam:     1.80 cm   LV E/e' lateral: 16.1 LV SV:         78 LV SV Index:   40 LVOT Area:     2.54 cm  RIGHT VENTRICLE RV S prime:     14.50 cm/s TAPSE (M-mode): 2.0 cm LEFT ATRIUM             Index        RIGHT ATRIUM           Index LA diam:        4.00 cm 2.05 cm/m   RA Area:     22.80 cm LA Vol (A2C):   80.5 ml 41.19 ml/m  RA Volume:   76.20 ml  38.99 ml/m LA Vol (A4C):   65.5 ml 33.52 ml/m LA Biplane Vol: 77.9 ml 39.86 ml/m  AORTIC VALVE AV Area (Vmax):    1.06 cm AV Area (Vmean):   1.18 cm AV Area (VTI):     1.24 cm AV Vmax:           282.00 cm/s AV Vmean:          179.000 cm/s AV VTI:            0.634 m AV Peak Grad:      31.8 mmHg AV Mean Grad:      15.5 mmHg LVOT Vmax:         117.00 cm/s LVOT Vmean:        83.100 cm/s LVOT VTI:          0.308 m LVOT/AV VTI ratio: 0.49  AORTA Ao Root diam: 3.40 cm MITRAL VALVE MV Area (PHT): 5.27 cm     SHUNTS MV Decel Time: 144 msec     Systemic VTI:  0.31 m MV E velocity: 135.00 cm/s  Systemic Diam: 1.80 cm MV A velocity: 123.00 cm/s MV E/A ratio:  1.10 Carlyle Dolly MD Electronically signed by Carlyle Dolly MD Signature Date/Time: 02/28/2022/1:33:18 PM    Final    DG Chest 2 View  Result Date: 02/28/2022 CLINICAL DATA:  Shortness of breath. EXAM: CHEST - 2 VIEW COMPARISON:  Chest radiograph dated 08/05/2021. FINDINGS: Trace pleural effusion on the lateral view. No focal consolidation, or pneumothorax. The cardiac silhouette is within limits. Median sternotomy wires and mechanical cardiac valve. No acute osseous pathology.  IMPRESSION: Trace pleural effusions.  No focal consolidation. Electronically Signed   By: Anner Crete M.D.   On: 02/28/2022 01:02    Orson Eva, DO  [facility]  If 7PM-7AM, please contact night-coverage [email or web address] Password [email or web address] 03/09/2022, 4:46 PM   LOS: 9 days

## 2022-03-10 ENCOUNTER — Telehealth: Payer: Self-pay | Admitting: *Deleted

## 2022-03-10 DIAGNOSIS — I5031 Acute diastolic (congestive) heart failure: Secondary | ICD-10-CM | POA: Diagnosis not present

## 2022-03-10 DIAGNOSIS — N186 End stage renal disease: Secondary | ICD-10-CM | POA: Diagnosis not present

## 2022-03-10 LAB — RENAL FUNCTION PANEL
Albumin: 2.8 g/dL — ABNORMAL LOW (ref 3.5–5.0)
Anion gap: 13 (ref 5–15)
BUN: 46 mg/dL — ABNORMAL HIGH (ref 8–23)
CO2: 25 mmol/L (ref 22–32)
Calcium: 8.8 mg/dL — ABNORMAL LOW (ref 8.9–10.3)
Chloride: 97 mmol/L — ABNORMAL LOW (ref 98–111)
Creatinine, Ser: 4.19 mg/dL — ABNORMAL HIGH (ref 0.61–1.24)
GFR, Estimated: 14 mL/min — ABNORMAL LOW (ref >60)
Glucose, Bld: 116 mg/dL — ABNORMAL HIGH (ref 70–99)
Phosphorus: 5.2 mg/dL — ABNORMAL HIGH (ref 2.5–4.6)
Potassium: 4 mmol/L (ref 3.5–5.1)
Sodium: 135 mmol/L (ref 135–145)

## 2022-03-10 MED ORDER — SODIUM CHLORIDE 0.9 % IV SOLN
250.0000 mg | Freq: Every day | INTRAVENOUS | Status: DC
  Administered 2022-03-10 – 2022-03-11 (×2): 250 mg via INTRAVENOUS
  Filled 2022-03-10 (×3): qty 20

## 2022-03-10 NOTE — Progress Notes (Signed)
Pt has complained of pain in legs and feet throughout night. PRN oxycodone given. Pt also complained of severe cramping in legs. Pt's IV was pulled out when I entered his room, new PIV placed in LFA.

## 2022-03-10 NOTE — TOC Progression Note (Signed)
Transition of Care [facility]) - Progression Note    Patient Details  Name: Matthew [name] MRN: [id number] Date of Birth: 23-Jun-1945  Transition of [facility]) CM/SW Contact  Shade Flood, LCSW Phone Number: 03/10/2022, 3:46 PM  Clinical Narrative:     TOC following. Pt has new SNF auth from insurance. [facility] has still not confirmed a chair time for outpatient HD. They have pt tentatively scheduled to start on 11/9 though they are still working on insurance confirmation. Pt cannot dc until outpatient HD schedule confirmed.  Updated pt's daughter and MD. Will follow up tomorrow.  Expected Discharge Plan: [place] Barriers to Discharge: Continued Medical Work up  Expected Discharge Plan and Services Expected Discharge Plan: [place] In-house Referral: Clinical Social Work Discharge Planning Services: CM Consult   Living arrangements for the past 2 months: Single Family Home                                       Social Determinants of Health (SDOH) Interventions    Readmission Risk Interventions    03/03/2022   12:35 PM  Readmission Risk Prevention Plan  Transportation Screening Complete  Home Care Screening Complete  Medication Review (RN CM) Complete

## 2022-03-10 NOTE — Progress Notes (Signed)
PROGRESS NOTE  WALDRON GERRY BDZ:[id number] DOB: [birth year] DOA: 02/28/2022 PCP: Charlsie Merles, MD  Brief History:  Matthew [name] is a 76 y.o. male with medical history significant of hypertension, hyperlipidemia, history of coronary artery disease status post CABG, status post AVR, chronic kidney disease stage IV, peripheral arterial disease and GERD; who presented to the emergency department secondary to shortness of breath, chest pressure and dyspnea exertion. Patient denies any focal deficits, coughing spells, hematuria, dysuria, melena, hematochezia, fever/chills or any other complaints.   Work-up demonstrating elevated BNP, hypoxia (new to patient) and significant worsening in his renal function.  TR[facility] has been contacted to place patient in the hospital for further evaluation and management.  Nephrology and cardiology service consulted.  He was started in IV lasix with symptomatic improvement.  Unfortunately, his serum creatinine continued to worsen.  After discussions with palliative medicine and the medical team, patient decided to pursue HD.  General surgery was consulted and plans for tunneled HD catheter on 03/06/22. Discharge was delayed due to initiation of HD and arrangement of outpatient HD chair.  Assessment/Plan:  Acute respiratory failure with hypoxia -due to pulmonary edema and fluid overload -improving with diuresis -weaned to RA   Acute HFpEF -02/28/22 Echo--EF 65-70%, no WMA, normal RVF, bioprosthetic AVR -clinically improving on IV lasix>>po lasix -Diuretics transitioned to oral route 10/31 -daily weight and strict I's and O's -Patient creatinine once diuresis has been initiated has trended up some (creatinine 7.35)   acute kidney injury on chronic renal failure stage IV -baseline creatinine 3.3-3.5 -presented with serum creatinine 5.89 -appreciate nephrology followup and recommendations -Continue strict I's and O's and daily weight -No signs of  UTI -Renal ultrasound demonstrating chronic medical renal disease without hydronephrosis or obstruction. -After IV diuresis provided creatinine continues to trend up -Palliative care consulted and after discussion with daughter and patient they are interested in full scope of practice and wish to pursue HD -general surgery consulted>>tunneled HD catheter 11/2 -03/06/22>>first HD session -03/07/22>>second HD -third HD 03/09/22 -plan HD again 03/11/22   hypertension -Stable -Continue amlodipine and Coreg--holding for HD -Also receiving IV diuresis>>now on HD -As needed hydralazine has been ordered for systolic blood pressure above 341 and/or diastolic blood pressure more than 110. -anticipate improving BP with HD   anemia of chronic kidney disease -Given history of coronary disease plan is for transfusion if hemoglobin less than 7.5 -Fecal occult blood test at time of admission was positive; stopping heparin products and using SCDs for DVT prophylaxis -Continue monitoring hemoglobin trend -IV iron and Epogen as per nephrology discretion. -Last hemoglobin level 8.9 -No signs of overt bleeding appreciated. -Hgb has remained stable for the hospitalization   coronary artery disease -no chest pain -Continue aspirin and statin -Patient denies chest pain. -2D echo--EF 65-70%, no WMA, normal RVF   hyperlipidemia -Continue statin -Heart healthy diet discussed with patient.   gout -No acute flare currently -Continue Uloric   GERD -Continue Pepcid   stage II pressure injury -Localizing his coccyx; no signs of superimposed infection -Present at time of admission -Continue constant repositioning and preventive measures.   Epistaxis -had 10 min episode 11/3 -stopped with Afrin and pressure -Hgb stable   Family Communication:   daughter updated 11/3   Consultants:  renal, general surgery, palliative   Code Status:  FULL   DVT Prophylaxis:  SCDs     Procedures: As Listed in  Progress Note Above  Antibiotics: None        Subjective: Patient denies fevers, chills, headache, chest pain, dyspnea, nausea, vomiting, diarrhea, abdominal pain, dysuria, hematuria, hematochezia, and melena.   Objective: Vitals:   03/10/22 0520 03/10/22 0807 03/10/22 0816 03/10/22 1439  BP: (!) 106/59 135/76 135/76 118/71  Pulse: 74  76 66  Resp: 20  17 16   Temp: 98.3 F (36.8 C)  (!) 97.5 F (36.4 C) 98.5 F (36.9 C)  TempSrc: Oral  Tympanic   SpO2: 99%  97% 100%  Weight:      Height:        Intake/Output Summary (Last 24 hours) at 03/10/2022 1714 Last data filed at 03/10/2022 1600 Gross per 24 hour  Intake 1250.18 ml  Output 1000 ml  Net 250.18 ml   Weight change: 0.1 kg Exam:  General:  Pt is alert, follows commands appropriately, not in acute distress HEENT: No icterus, No thrush, No neck mass, [place]/AT Cardiovascular: RRR, S1/S2, no rubs, no gallops Respiratory: CTA bilaterally, no wheezing, no crackles, no rhonchi Abdomen: Soft/+BS, non tender, non distended, no guarding Extremities: No edema, No lymphangitis, No petechiae, No rashes, no synovitis   Data Reviewed: I have personally reviewed following labs and imaging studies Basic Metabolic Panel: Recent Labs  Lab 03/05/22 1001 03/07/22 1410 03/08/22 0342 03/09/22 0332 03/10/22 0400  NA 133* 134* 136 136 135  K 4.1 3.8 3.2* 3.4* 4.0  CL 101 96* 95* 97* 97*  CO2 18* 21* 27 24 25   GLUCOSE 137* 149* 102* 96 116*  BUN 96* 90* 54* 71* 46*  CREATININE 7.35* 6.25* 4.38* 5.30* 4.19*  CALCIUM 8.2* 8.7* 8.7* 8.8* 8.8*  PHOS 8.3* 7.5* 5.7* 7.1* 5.2*   Liver Function Tests: Recent Labs  Lab 03/05/22 1001 03/07/22 1410 03/08/22 0342 03/09/22 0332 03/10/22 0400  ALBUMIN 2.7* 3.0* 2.7* 2.8* 2.8*   No results for input(s): "LIPASE", "AMYLASE" in the last 168 hours. No results for input(s): "AMMONIA" in the last 168 hours. Coagulation Profile: No results for input(s): "INR", "PROTIME" in the last  168 hours. CBC: Recent Labs  Lab 03/05/22 0451 03/07/22 1445 03/08/22 0342  WBC 10.8* 11.9* 10.8*  HGB 8.2* 9.7* 8.9*  HCT 25.5* 29.8* 27.3*  MCV 95.1 93.4 94.5  PLT 227 361 302   Cardiac Enzymes: No results for input(s): "CKTOTAL", "CKMB", "CKMBINDEX", "TROPONINI" in the last 168 hours. BNP: Invalid input(s): "POCBNP" CBG: No results for input(s): "GLUCAP" in the last 168 hours. HbA1C: No results for input(s): "HGBA1C" in the last 72 hours. Urine analysis:    Component Value Date/Time   COLORURINE YELLOW 02/28/2022 0112   APPEARANCEUR CLEAR 02/28/2022 0112   LABSPEC 1.013 02/28/2022 0112   PHURINE 5.0 02/28/2022 0112   GLUCOSEU NEGATIVE 02/28/2022 0112   HGBUR NEGATIVE 02/28/2022 0112   BILIRUBINUR NEGATIVE 02/28/2022 0112   KETONESUR NEGATIVE 02/28/2022 0112   PROTEINUR >=300 (A) 02/28/2022 0112   NITRITE NEGATIVE 02/28/2022 0112   LEUKOCYTESUR SMALL (A) 02/28/2022 0112   Sepsis Labs: @LABRCNTIP (procalcitonin:4,lacticidven:4) ) Recent Results (from the past 240 hour(s))  Surgical pcr screen     Status: None   Collection Time: 03/05/22  3:46 PM   Specimen: Nasal Mucosa; Nasal Swab  Result Value Ref Range Status   MRSA, PCR NEGATIVE NEGATIVE Final   Staphylococcus aureus NEGATIVE NEGATIVE Final    Comment: (NOTE) The Xpert SA Assay (FDA approved for NASAL specimens in patients 21 years of age and older), is one component of a comprehensive surveillance program. It is not  intended to diagnose infection nor to guide or monitor treatment. Performed at [facility], [address], [place], [place] [address]      Scheduled Meds:  amLODipine  10 mg Oral q morning   aspirin EC  81 mg Oral Daily   atorvastatin  80 mg Oral q1800   calcium carbonate  1 tablet Oral Q breakfast   carvedilol  25 mg Oral BID   Chlorhexidine Gluconate Cloth  6 each Topical Q0600   Chlorhexidine Gluconate Cloth  6 each Topical Once   darbepoetin (ARANESP) injection - NON-DIALYSIS  100  mcg Subcutaneous Q Mon-1800   diclofenac Sodium  2 g Topical QID   famotidine  10 mg Oral Daily   febuxostat  40 mg Oral Daily   furosemide  40 mg Oral BID   gabapentin  100 mg Oral QHS   sodium chloride flush  3 mL Intravenous Q12H   Continuous Infusions:  sodium chloride 10 mL/hr at 03/06/22 1241   anticoagulant sodium citrate     ferric gluconate (FERRLECIT) IVPB 250 mg (03/10/22 1240)    Procedures/Studies: DG Chest Port 1 View  Result Date: 03/06/2022 CLINICAL DATA:  Placement of dialysis catheter EXAM: PORTABLE CHEST 1 VIEW COMPARISON:  Previous studies including the examination done on 02/28/2022 FINDINGS: Transverse diameter of heart is slightly increased. There are no signs of pulmonary edema or focal pulmonary consolidation. 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 There is no pleural effusion or pneumothorax. There is interval placement of right IJ central venous catheter with its tip in the right atrium. IMPRESSION: Tip of right IJ central venous catheter is seen at the junction of superior vena cava and right atrium. There are no signs of pulmonary edema or focal pulmonary consolidation. There is no pleural effusion or pneumothorax. Electronically Signed   By: Elmer Picker M.D.   On: 03/06/2022 13:41   DG C-Arm 1-60 Min-No Report  Result Date: 03/06/2022 Fluoroscopy was utilized by the requesting physician.  No radiographic interpretation.   US RENAL  Result Date: 02/28/2022 CLINICAL DATA:  Acute renal insufficiency EXAM: RENAL / URINARY TRACT ULTRASOUND COMPLETE COMPARISON:  03/09/2021, 06/25/2021 FINDINGS: Right Kidney: Renal measurements: 11.2 x 5.0 x 5.9 cm = volume: 172.5 mL. Increased renal cortical echotexture. There is a benign simple cyst measuring 1.5 cm off the ventral mid aspect of the kidney. No specific imaging follow-up is required. No hydronephrosis or nephrolithiasis. Left Kidney: Renal measurements: 13.8 x 7.7 by 7.9 cm = volume: 440.2 mL. There is  left renal cortical thinning, with increased renal cortical echotexture. Multiple cysts are again identified, largest measuring up to 6.4 cm. No hydronephrosis or nephrolithiasis. Bladder: Appears normal for degree of bladder distention. Other: None. IMPRESSION: 1. Bilateral increased renal cortical echotexture, left renal cortical thinning, consistent with medical renal disease. 2. Bilateral renal cortical cysts, unchanged since prior exam. Electronically Signed   By: Randa Ngo M.D.   On: 02/28/2022 15:24   ECHOCARDIOGRAM COMPLETE  Result Date: 02/28/2022    ECHOCARDIOGRAM REPORT   Patient Name:   Matthew [name] Date of Exam: 02/28/2022 Medical Rec #:  [id number]       Height:       68.0 in Accession #:    [id number]      Weight:       180.0 lb Date of Birth:  Oct 19, 1945       BSA:          1.954 m Patient Age:    47 years  BP:           197/83 mmHg Patient Gender: M               HR:           71 bpm. Exam Location:  [facility] Procedure: 2D Echo, Cardiac Doppler and Color Doppler Indications:    Congestive Heart Failure I50.9  History:        Patient has prior history of Echocardiogram examinations, most                 recent 10/07/2018. Prior CABG; Risk Factors:Hypertension,                 Dyslipidemia and Pre-diabetes. CKD (chronic kidney disease),                 stage III (HCC), Non-ST elevation (NSTEMI) myocardial infarction                 [facility]), s/p AVR.  Sonographer:    Alvino Chapel RCS Referring Phys: [id number] ASIA B [place]  1. Left ventricular ejection fraction, by estimation, is 65 to 70%. The left ventricle has normal function. The left ventricle has no regional wall motion abnormalities. There is moderate left ventricular hypertrophy. Left ventricular diastolic parameters are indeterminate. Elevated left atrial pressure.  2. Right ventricular systolic function is normal. The right ventricular size is normal.  3. Left atrial size was mildly dilated.  4. Right atrial  size was mildly dilated.  5. The mitral valve is abnormal. Mild mitral valve regurgitation. No evidence of mitral stenosis.  6. A 50mm Edwards INSPIRIS RESILIA PERICARDIAL VALVE bioprosthesis valve     is present in the aortic position. The aortic valve has been repaired/replaced. Aortic valve regurgitation is not visualized. No aortic stenosis is present.  7. The inferior vena cava is normal in size with greater than 50% respiratory variability, suggesting right atrial pressure of 3 mmHg. FINDINGS  Left Ventricle: Left ventricular ejection fraction, by estimation, is 65 to 70%. The left ventricle has normal function. The left ventricle has no regional wall motion abnormalities. The left ventricular internal cavity size was normal in size. There is  moderate left ventricular hypertrophy. Left ventricular diastolic parameters are indeterminate. Elevated left atrial pressure. Right Ventricle: The right ventricular size is normal. Right vetricular wall thickness was not well visualized. Right ventricular systolic function is normal. Left Atrium: Left atrial size was mildly dilated. Right Atrium: Right atrial size was mildly dilated. Pericardium: There is no evidence of pericardial effusion. Mitral Valve: The mitral valve is abnormal. There is mild thickening of the mitral valve leaflet(s). There is mild calcification of the mitral valve leaflet(s). Mild mitral annular calcification. Mild mitral valve regurgitation. No evidence of mitral valve stenosis. Tricuspid Valve: The tricuspid valve is normal in structure. Tricuspid valve regurgitation is trivial. No evidence of tricuspid stenosis. Aortic Valve: A 59mm Edwards INSPIRIS RESILIA PERICARDIAL VALVE bioprosthesis valve is present in the aortic position. The aortic valve has been repaired/replaced. Aortic valve regurgitation is not visualized. No aortic stenosis is present. Aortic valve mean gradient measures 15.5 mmHg. Aortic valve peak gradient measures 31.8 mmHg.  Aortic valve area, by VTI measures 1.24 cm. Pulmonic Valve: The pulmonic valve was not well visualized. Pulmonic valve regurgitation is not visualized. No evidence of pulmonic stenosis. Aorta: The aortic root is normal in size and structure. Venous: The inferior vena cava is normal in size with greater than 50% respiratory variability, suggesting right atrial  pressure of 3 mmHg. IAS/Shunts: No atrial level shunt detected by color flow Doppler.  LEFT VENTRICLE PLAX 2D LVIDd:         4.00 cm   Diastology LVIDs:         2.20 cm   LV e' medial:    5.87 cm/s LV PW:         1.30 cm   LV E/e' medial:  23.0 LV IVS:        1.30 cm   LV e' lateral:   8.38 cm/s LVOT diam:     1.80 cm   LV E/e' lateral: 16.1 LV SV:         78 LV SV Index:   40 LVOT Area:     2.54 cm  RIGHT VENTRICLE RV S prime:     14.50 cm/s TAPSE (M-mode): 2.0 cm LEFT ATRIUM             Index        RIGHT ATRIUM           Index LA diam:        4.00 cm 2.05 cm/m   RA Area:     22.80 cm LA Vol (A2C):   80.5 ml 41.19 ml/m  RA Volume:   76.20 ml  38.99 ml/m LA Vol (A4C):   65.5 ml 33.52 ml/m LA Biplane Vol: 77.9 ml 39.86 ml/m  AORTIC VALVE AV Area (Vmax):    1.06 cm AV Area (Vmean):   1.18 cm AV Area (VTI):     1.24 cm AV Vmax:           282.00 cm/s AV Vmean:          179.000 cm/s AV VTI:            0.634 m AV Peak Grad:      31.8 mmHg AV Mean Grad:      15.5 mmHg LVOT Vmax:         117.00 cm/s LVOT Vmean:        83.100 cm/s LVOT VTI:          0.308 m LVOT/AV VTI ratio: 0.49  AORTA Ao Root diam: 3.40 cm MITRAL VALVE MV Area (PHT): 5.27 cm     SHUNTS MV Decel Time: 144 msec     Systemic VTI:  0.31 m MV E velocity: 135.00 cm/s  Systemic Diam: 1.80 cm MV A velocity: 123.00 cm/s MV E/A ratio:  1.10 Carlyle Dolly MD Electronically signed by Carlyle Dolly MD Signature Date/Time: 02/28/2022/1:33:18 PM    Final    DG Chest 2 View  Result Date: 02/28/2022 CLINICAL DATA:  Shortness of breath. EXAM: CHEST - 2 VIEW COMPARISON:  Chest radiograph dated  08/05/2021. FINDINGS: Trace pleural effusion on the lateral view. No focal consolidation, or pneumothorax. The cardiac silhouette is within limits. Median sternotomy wires and mechanical cardiac valve. No acute osseous pathology. IMPRESSION: Trace pleural effusions.  No focal consolidation. Electronically Signed   By: Anner Crete M.D.   On: 02/28/2022 01:02    Orson Eva, DO  [facility]  If 7PM-7AM, please contact night-coverage [email or web address] Password [email or web address] 03/10/2022, 5:14 PM   LOS: 10 days

## 2022-03-10 NOTE — Progress Notes (Signed)
Physical Therapy Treatment Patient Details Name: Matthew [name] MRN: [id number] DOB: 1946-02-21 Today's Date: 03/10/2022   History of Present Illness ARCANGEL MINION is a 76 y.o. male with medical history significant of hypertension, hyperlipidemia, history of coronary artery disease status post CABG, status post AVR, chronic kidney disease stage IV, peripheral arterial disease and GERD; who presented to the emergency department secondary to shortness of breath, chest pressure and dyspnea exertion.  Patient denies any focal deficits, coughing spells, hematuria, dysuria, melena, hematochezia, fever/chills or any other complaints.    PT Comments    Pt unwilling to participate originally but able to encourage to complete mobility. Daughter was present for session.  Pt overall with slow mobility requiring increased time to complete transfers.  Min-mod assist to stand from bed due to weakness.  Pt generally forward bent and was able to straighten some as he ambulated but mostly remained forward.  Small, slow steps were also made with increased time to complete as compared to baseline (per daughter).  Pt returned to bed.  Increased time to complete sit to supine transfer due to sacral discomfort (as a pressure sore per daughter). Pt requested pain meds and nursing notified.      Recommendations for follow up therapy are one component of a multi-disciplinary discharge planning process, led by the attending physician.  Recommendations may be updated based on patient status, additional functional criteria and insurance authorization.  Follow Up Recommendations  Skilled nursing-short term rehab (<3 hours/day) Can patient physically be transported by private vehicle: No   Assistance Recommended at Discharge Intermittent Supervision/Assistance  Patient can return home with the following A lot of help with walking and/or transfers;A lot of help with bathing/dressing/bathroom;Assistance with  cooking/housework;Help with stairs or ramp for entrance   Equipment Recommendations  None recommended by PT           Mobility  Bed Mobility Overal bed mobility: Needs Assistance       Supine to sit: Min assist     General bed mobility comments: slow mobility, cautious with LE movement due to pain.  use of bedrails.    Transfers Overall transfer level: Needs assistance Equipment used: Rolling walker (2 wheels) Transfers: Sit to/from Stand Sit to Stand: Min assist, Mod assist                Ambulation/Gait Ambulation/Gait assistance: Min assist Gait Distance (Feet): 25 Feet Assistive device: Rolling walker (2 wheels) Gait Pattern/deviations: Decreased step length - right, Decreased step length - left, Decreased dorsiflexion - right, Decreased dorsiflexion - left, Trunk flexed, Narrow base of support                Cognition Arousal/Alertness: Awake/alert Behavior During Therapy: WFL for tasks assessed/performed Overall Cognitive Status: Within Functional Limits for tasks assessed                                                 Pertinent Vitals/Pain Pain Assessment Pain Assessment: 0-10 Pain Score: 8  Pain Location: in LE's and sacrum, c/o pain at end of session Pain Descriptors / Indicators: Aching, Burning, Sore Pain Intervention(s): Limited activity within patient's tolerance, Repositioned     PT Goals (current goals can now be found in the care plan section) Progress towards PT goals: Progressing toward goals    Frequency    Min 3X/week  PT Plan Current plan remains appropriate       AM-PAC PT "6 Clicks" Mobility   Outcome Measure  Help needed turning from your back to your side while in a flat bed without using bedrails?: A Lot Help needed moving from lying on your back to sitting on the side of a flat bed without using bedrails?: A Lot Help needed moving to and from a bed to a chair (including a wheelchair)?:  A Lot Help needed standing up from a chair using your arms (e.g., wheelchair or bedside chair)?: A Lot Help needed to walk in hospital room?: Total Help needed climbing 3-5 steps with a railing? : Total 6 Click Score: 10    End of Session   Activity Tolerance: Patient tolerated treatment well;Patient limited by fatigue Patient left: with call bell/phone within reach;Other (comment);in bed;with family/visitor present;with nursing/sitter in room (hoyar lift harness in place) Nurse Communication: Mobility status;Other (comment) (need for pain meds) PT Visit Diagnosis: Unsteadiness on feet (R26.81);Other abnormalities of gait and mobility (R26.89);Muscle weakness (generalized) (M62.81)     Time: 1530-1600 PT Time Calculation (min) (ACUTE ONLY): 30 min  Charges:  $Gait Training: 8-22 mins $Therapeutic Activity: 8-22 mins                    Teena Irani, PTA/CLT [place] Ph: [place], Lily 03/10/2022, 4:23 PM

## 2022-03-10 NOTE — Patient Outreach (Signed)
  Care Coordination   03/10/2022 Name: Matthew [name] MRN: [id number] DOB: Jun 18, 1945   Care Coordination Outreach Attempts:  An unsuccessful telephone outreach was attempted today to offer the patient information about available care coordination services as a benefit of their health plan.   Follow Up Plan:  Additional outreach attempts will be made to offer the patient care coordination information and services.   Encounter Outcome:  No Answer  Care Coordination Interventions Activated:  No   Care Coordination Interventions:  No, not indicated    SIG Lashaun Poch C. Myrtie Neither, MSN, [facility] Gerontological Nurse Practitioner [facility] Care Management [phone]

## 2022-03-10 NOTE — Progress Notes (Signed)
Patient ID: Matthew [name], male   DOB: 12-23-1945, 76 y.o.   MRN: [id number] S:  HD #3 yesterday, 1L UF tolerated well using TDC Pending SNF and CLIP C/o about food today  O:BP 135/76 (BP Location: Left Arm)   Pulse 76   Temp (!) 97.5 F (36.4 C) (Tympanic)   Resp 17   Ht 5\' 8"  (1.727 m)   Wt 77.5 kg   SpO2 97%   BMI 25.98 kg/m   Intake/Output Summary (Last 24 hours) at 03/10/2022 0630 Last data filed at 03/10/2022 0825 Gross per 24 hour  Intake 480 ml  Output 550 ml  Net -70 ml    Intake/Output: I/O last 3 completed shifts: In: 480 [P.O.:480] Out: 800 [Urine:800]  Intake/Output this shift:  Total I/O In: 240 [P.O.:240] Out: 250 [Urine:250] Weight change: 0.1 kg Gen:NAD CVS: RRR Resp: CTA Abd: +BS, soft, NT/ND Ext: trace pretibial edema bialterally R IJ [facility] C/D/I  Recent Labs  Lab 03/04/22 0348 03/05/22 0451 03/05/22 1001 03/07/22 1410 03/08/22 0342 03/09/22 0332 03/10/22 0400  NA 136 134* 133* 134* 136 136 135  K 4.2 4.0 4.1 3.8 3.2* 3.4* 4.0  CL 103 101 101 96* 95* 97* 97*  CO2 19* 19* 18* 21* 27 24 25   GLUCOSE 110* 109* 137* 149* 102* 96 116*  BUN 75* 93* 96* 90* 54* 71* 46*  CREATININE 6.50* 7.12* 7.35* 6.25* 4.38* 5.30* 4.19*  ALBUMIN 3.0* 2.8* 2.7* 3.0* 2.7* 2.8* 2.8*  CALCIUM 8.7* 8.4* 8.2* 8.7* 8.7* 8.8* 8.8*  PHOS 6.9* 8.0* 8.3* 7.5* 5.7* 7.1* 5.2*    Liver Function Tests: Recent Labs  Lab 03/08/22 0342 03/09/22 0332 03/10/22 0400  ALBUMIN 2.7* 2.8* 2.8*    No results for input(s): "LIPASE", "AMYLASE" in the last 168 hours. No results for input(s): "AMMONIA" in the last 168 hours. CBC: Recent Labs  Lab 03/05/22 0451 03/07/22 1445 03/08/22 0342  WBC 10.8* 11.9* 10.8*  HGB 8.2* 9.7* 8.9*  HCT 25.5* 29.8* 27.3*  MCV 95.1 93.4 94.5  PLT 227 361 302    Cardiac Enzymes: No results for input(s): "CKTOTAL", "CKMB", "CKMBINDEX", "TROPONINI" in the last 168 hours. CBG: No results for input(s): "GLUCAP" in the last 168  hours.  Iron Studies: No results for input(s): "IRON", "TIBC", "TRANSFERRIN", "FERRITIN" in the last 72 hours. Studies/Results: No results found.  amLODipine  10 mg Oral q morning   aspirin EC  81 mg Oral Daily   atorvastatin  80 mg Oral q1800   calcium carbonate  1 tablet Oral Q breakfast   carvedilol  25 mg Oral BID   Chlorhexidine Gluconate Cloth  6 each Topical Q0600   Chlorhexidine Gluconate Cloth  6 each Topical Once   darbepoetin (ARANESP) injection - NON-DIALYSIS  100 mcg Subcutaneous Q Mon-1800   diclofenac Sodium  2 g Topical QID   famotidine  10 mg Oral Daily   febuxostat  40 mg Oral Daily   furosemide  40 mg Oral BID   gabapentin  100 mg Oral QHS   oxymetazoline  3 spray Each Nare BID   sodium chloride flush  3 mL Intravenous Q12H    BMET    Component Value Date/Time   NA 135 03/10/2022 0400   K 4.0 03/10/2022 0400   CL 97 (L) 03/10/2022 0400   CO2 25 03/10/2022 0400   GLUCOSE 116 (H) 03/10/2022 0400   BUN 46 (H) 03/10/2022 0400   CREATININE 4.19 (H) 03/10/2022 0400   CALCIUM 8.8 (L) 03/10/2022  0400   GFRNONAA 14 (L) 03/10/2022 0400   GFRAA 34 (L) 10/07/2018 0651   CBC    Component Value Date/Time   WBC 10.8 (H) 03/08/2022 0342   RBC 2.89 (L) 03/08/2022 0342   HGB 8.9 (L) 03/08/2022 0342   HCT 27.3 (L) 03/08/2022 0342   PLT 302 03/08/2022 0342   MCV 94.5 03/08/2022 0342   MCH 30.8 03/08/2022 0342   MCHC 32.6 03/08/2022 0342   RDW 12.8 03/08/2022 0342   LYMPHSABS 1.2 03/01/2022 0536   MONOABS 0.7 03/01/2022 0536   EOSABS 0.3 03/01/2022 0536   BASOSABS 0.0 03/01/2022 0536     Assessment/Plan:   New ESRD Dr. Constance Haw  kindly placed RIJ [facility] 03/06/22.   HD start 11/2, 11/3, 11/5 Can be referred outpt for AVF/G CLIP in process likely [facility] HD tomorrow if here: 3K, 3.5h, TDC, 1-2 L UF, no heparin Acute on chronic diastolic CHF - improved with diuresis. HTN - stable Anemia of CKD stage V - started on ESA, CTM 10/27 TSAT 10% needs IV Fe start  today CAD - extensive history, currently stable. CKD BMD: P and Ca ok, CTM Moderate protein malnutrition - protein supplementation Deconditioning - for SNF.  SW involved.  Disposition - CLIP in process  Rexene Agent, MD  [facility]

## 2022-03-10 NOTE — Progress Notes (Signed)
Palliative: Mr. Penton has received his initial dialysis treatment.  Transition of care team is working diligently for short-term rehab and outpatient HD placement.  No additional needs at this time.  No charge Quinn Axe, NP Palliative medicine team Team phone [phone] Greater than 50% of this time was spent counseling and coordinating care related to the above assessment and plan.

## 2022-03-11 DIAGNOSIS — D638 Anemia in other chronic diseases classified elsewhere: Secondary | ICD-10-CM | POA: Diagnosis not present

## 2022-03-11 DIAGNOSIS — N179 Acute kidney failure, unspecified: Secondary | ICD-10-CM | POA: Diagnosis not present

## 2022-03-11 DIAGNOSIS — L89322 Pressure ulcer of left buttock, stage 2: Secondary | ICD-10-CM | POA: Diagnosis not present

## 2022-03-11 DIAGNOSIS — M6281 Muscle weakness (generalized): Secondary | ICD-10-CM | POA: Diagnosis not present

## 2022-03-11 DIAGNOSIS — I739 Peripheral vascular disease, unspecified: Secondary | ICD-10-CM | POA: Diagnosis not present

## 2022-03-11 DIAGNOSIS — I5031 Acute diastolic (congestive) heart failure: Secondary | ICD-10-CM | POA: Diagnosis not present

## 2022-03-11 DIAGNOSIS — I10 Essential (primary) hypertension: Secondary | ICD-10-CM | POA: Diagnosis not present

## 2022-03-11 DIAGNOSIS — I5032 Chronic diastolic (congestive) heart failure: Secondary | ICD-10-CM | POA: Diagnosis not present

## 2022-03-11 DIAGNOSIS — E559 Vitamin D deficiency, unspecified: Secondary | ICD-10-CM | POA: Diagnosis not present

## 2022-03-11 DIAGNOSIS — K219 Gastro-esophageal reflux disease without esophagitis: Secondary | ICD-10-CM | POA: Diagnosis not present

## 2022-03-11 DIAGNOSIS — I2510 Atherosclerotic heart disease of native coronary artery without angina pectoris: Secondary | ICD-10-CM | POA: Diagnosis not present

## 2022-03-11 DIAGNOSIS — M109 Gout, unspecified: Secondary | ICD-10-CM | POA: Diagnosis not present

## 2022-03-11 DIAGNOSIS — L89152 Pressure ulcer of sacral region, stage 2: Secondary | ICD-10-CM | POA: Diagnosis not present

## 2022-03-11 DIAGNOSIS — E119 Type 2 diabetes mellitus without complications: Secondary | ICD-10-CM | POA: Diagnosis not present

## 2022-03-11 DIAGNOSIS — E785 Hyperlipidemia, unspecified: Secondary | ICD-10-CM | POA: Diagnosis not present

## 2022-03-11 DIAGNOSIS — R5381 Other malaise: Secondary | ICD-10-CM | POA: Diagnosis not present

## 2022-03-11 DIAGNOSIS — G5783 Other specified mononeuropathies of bilateral lower limbs: Secondary | ICD-10-CM | POA: Diagnosis not present

## 2022-03-11 DIAGNOSIS — R279 Unspecified lack of coordination: Secondary | ICD-10-CM | POA: Diagnosis not present

## 2022-03-11 DIAGNOSIS — R2689 Other abnormalities of gait and mobility: Secondary | ICD-10-CM | POA: Diagnosis not present

## 2022-03-11 DIAGNOSIS — I110 Hypertensive heart disease with heart failure: Secondary | ICD-10-CM | POA: Diagnosis not present

## 2022-03-11 DIAGNOSIS — K2100 Gastro-esophageal reflux disease with esophagitis, without bleeding: Secondary | ICD-10-CM | POA: Diagnosis not present

## 2022-03-11 DIAGNOSIS — Z992 Dependence on renal dialysis: Secondary | ICD-10-CM | POA: Diagnosis not present

## 2022-03-11 DIAGNOSIS — I25810 Atherosclerosis of coronary artery bypass graft(s) without angina pectoris: Secondary | ICD-10-CM | POA: Diagnosis not present

## 2022-03-11 DIAGNOSIS — J9690 Respiratory failure, unspecified, unspecified whether with hypoxia or hypercapnia: Secondary | ICD-10-CM | POA: Diagnosis not present

## 2022-03-11 DIAGNOSIS — N186 End stage renal disease: Secondary | ICD-10-CM | POA: Diagnosis not present

## 2022-03-11 DIAGNOSIS — D649 Anemia, unspecified: Secondary | ICD-10-CM | POA: Diagnosis not present

## 2022-03-11 DIAGNOSIS — E440 Moderate protein-calorie malnutrition: Secondary | ICD-10-CM | POA: Diagnosis not present

## 2022-03-11 DIAGNOSIS — I350 Nonrheumatic aortic (valve) stenosis: Secondary | ICD-10-CM | POA: Diagnosis not present

## 2022-03-11 LAB — CBC
HCT: 27.8 % — ABNORMAL LOW (ref 39.0–52.0)
Hemoglobin: 8.7 g/dL — ABNORMAL LOW (ref 13.0–17.0)
MCH: 30.1 pg (ref 26.0–34.0)
MCHC: 31.3 g/dL (ref 30.0–36.0)
MCV: 96.2 fL (ref 80.0–100.0)
Platelets: 338 10*3/uL (ref 150–400)
RBC: 2.89 MIL/uL — ABNORMAL LOW (ref 4.22–5.81)
RDW: 12.3 % (ref 11.5–15.5)
WBC: 8 10*3/uL (ref 4.0–10.5)
nRBC: 0 % (ref 0.0–0.2)

## 2022-03-11 LAB — RENAL FUNCTION PANEL
Albumin: 2.8 g/dL — ABNORMAL LOW (ref 3.5–5.0)
Anion gap: 14 (ref 5–15)
BUN: 72 mg/dL — ABNORMAL HIGH (ref 8–23)
CO2: 25 mmol/L (ref 22–32)
Calcium: 8.6 mg/dL — ABNORMAL LOW (ref 8.9–10.3)
Chloride: 97 mmol/L — ABNORMAL LOW (ref 98–111)
Creatinine, Ser: 6.2 mg/dL — ABNORMAL HIGH (ref 0.61–1.24)
GFR, Estimated: 9 mL/min — ABNORMAL LOW (ref >60)
Glucose, Bld: 130 mg/dL — ABNORMAL HIGH (ref 70–99)
Phosphorus: 7.9 mg/dL — ABNORMAL HIGH (ref 2.5–4.6)
Potassium: 3.5 mmol/L (ref 3.5–5.1)
Sodium: 136 mmol/L (ref 135–145)

## 2022-03-11 MED ORDER — HEPARIN SODIUM (PORCINE) 1000 UNIT/ML IJ SOLN
INTRAMUSCULAR | Status: AC
  Filled 2022-03-11: qty 1

## 2022-03-11 NOTE — TOC Transition Note (Signed)
Transition of Care [facility]) - CM/SW Discharge Note   Patient Details  Name: Matthew [name] MRN: [id number] Date of Birth: 05-Nov-1945  Transition of [facility]) CM/SW Contact:  Shade Flood, LCSW Phone Number: 03/11/2022, 1:49 PM   Clinical Narrative:     Pt stable for dc to [facility] today. Outpatient HD schedule confirmed. Pt will start at [facility] on Thursday 11/9. First day he needs to arrive at 1030. Other days, 11:15. Updated [facility] and this information was added to the AVS and given verbally to patient's daughter.   DC clinical sent electronically. RN to call report. [facility] w/c transport arranged.  There are no other TOC needs for dc.  Final next level of care: Skilled Nursing Facility Barriers to Discharge: Barriers Resolved   Patient Goals and CMS Choice Patient states their goals for this hospitalization and ongoing recovery are:: get better CMS Medicare.gov Compare Post Acute Care list provided to:: Patient Choice offered to / list presented to : Patient  Discharge Placement              Patient chooses bed at: [facility] Patient to be transferred to facility by: [facility] Name of family member notified: [name] Patient and family notified of of transfer: 03/11/22  Discharge Plan and Services In-house Referral: Clinical Social Work Discharge Planning Services: CM Consult                                 Social Determinants of Health ([place]) Interventions     Readmission Risk Interventions    03/03/2022   12:35 PM  Readmission Risk Prevention Plan  Transportation Screening Complete  Home Care Screening Complete  Medication Review (RN CM) Complete

## 2022-03-11 NOTE — Progress Notes (Signed)
Patient ID: Matthew [name], male   DOB: 25-Aug-1945, 76 y.o.   MRN: [id number] S:  No issues CLIP pending For HD today No c/o  O:BP 118/67 (BP Location: Right Arm)   Pulse 62   Temp 97.7 F (36.5 C) (Oral)   Resp 12   Ht 5\' 8"  (1.727 m)   Wt 76.4 kg   SpO2 99%   BMI 25.61 kg/m   Intake/Output Summary (Last 24 hours) at 03/11/2022 0929 Last data filed at 03/11/2022 0606 Gross per 24 hour  Intake 1250.18 ml  Output 550 ml  Net 700.18 ml    Intake/Output: I/O last 3 completed shifts: In: 1490.2 [P.O.:960; I.V.:238.6; IV Piggyback:291.6] Out: 1100 [Urine:1100]  Intake/Output this shift:  No intake/output data recorded. Weight change: -2.2 kg Gen:NAD CVS: RRR Resp: CTA Abd: +BS, soft, NT/ND Ext: trace pretibial edema bialterally R IJ [facility] C/D/I  Recent Labs  Lab 03/05/22 0451 03/05/22 1001 03/07/22 1410 03/08/22 0342 03/09/22 0332 03/10/22 0400  NA 134* 133* 134* 136 136 135  K 4.0 4.1 3.8 3.2* 3.4* 4.0  CL 101 101 96* 95* 97* 97*  CO2 19* 18* 21* 27 24 25   GLUCOSE 109* 137* 149* 102* 96 116*  BUN 93* 96* 90* 54* 71* 46*  CREATININE 7.12* 7.35* 6.25* 4.38* 5.30* 4.19*  ALBUMIN 2.8* 2.7* 3.0* 2.7* 2.8* 2.8*  CALCIUM 8.4* 8.2* 8.7* 8.7* 8.8* 8.8*  PHOS 8.0* 8.3* 7.5* 5.7* 7.1* 5.2*    Liver Function Tests: Recent Labs  Lab 03/08/22 0342 03/09/22 0332 03/10/22 0400  ALBUMIN 2.7* 2.8* 2.8*    No results for input(s): "LIPASE", "AMYLASE" in the last 168 hours. No results for input(s): "AMMONIA" in the last 168 hours. CBC: Recent Labs  Lab 03/05/22 0451 03/07/22 1445 03/08/22 0342  WBC 10.8* 11.9* 10.8*  HGB 8.2* 9.7* 8.9*  HCT 25.5* 29.8* 27.3*  MCV 95.1 93.4 94.5  PLT 227 361 302    Cardiac Enzymes: No results for input(s): "CKTOTAL", "CKMB", "CKMBINDEX", "TROPONINI" in the last 168 hours. CBG: No results for input(s): "GLUCAP" in the last 168 hours.  Iron Studies: No results for input(s): "IRON", "TIBC", "TRANSFERRIN", "FERRITIN" in the last  72 hours. Studies/Results: No results found.  aspirin EC  81 mg Oral Daily   atorvastatin  80 mg Oral q1800   carvedilol  25 mg Oral BID   Chlorhexidine Gluconate Cloth  6 each Topical Q0600   Chlorhexidine Gluconate Cloth  6 each Topical Once   darbepoetin (ARANESP) injection - NON-DIALYSIS  100 mcg Subcutaneous Q Mon-1800   diclofenac Sodium  2 g Topical QID   famotidine  10 mg Oral Daily   febuxostat  40 mg Oral Daily   gabapentin  100 mg Oral QHS   sodium chloride flush  3 mL Intravenous Q12H    BMET    Component Value Date/Time   NA 135 03/10/2022 0400   K 4.0 03/10/2022 0400   CL 97 (L) 03/10/2022 0400   CO2 25 03/10/2022 0400   GLUCOSE 116 (H) 03/10/2022 0400   BUN 46 (H) 03/10/2022 0400   CREATININE 4.19 (H) 03/10/2022 0400   CALCIUM 8.8 (L) 03/10/2022 0400   GFRNONAA 14 (L) 03/10/2022 0400   GFRAA 34 (L) 10/07/2018 0651   CBC    Component Value Date/Time   WBC 10.8 (H) 03/08/2022 0342   RBC 2.89 (L) 03/08/2022 0342   HGB 8.9 (L) 03/08/2022 0342   HCT 27.3 (L) 03/08/2022 0342   PLT 302 03/08/2022 0342  MCV 94.5 03/08/2022 0342   MCH 30.8 03/08/2022 0342   MCHC 32.6 03/08/2022 0342   RDW 12.8 03/08/2022 0342   LYMPHSABS 1.2 03/01/2022 0536   MONOABS 0.7 03/01/2022 0536   EOSABS 0.3 03/01/2022 0536   BASOSABS 0.0 03/01/2022 0536     Assessment/Plan:   New ESRD Dr. Constance Haw  kindly placed RIJ [facility] 03/06/22.   HD start 11/2, 11/3, 11/5 Can be referred outpt for AVF/G CLIP in process likely [facility] HD today: 3K, 3.5h, TDC, 1-2 L UF, no heparin Acute on chronic diastolic CHF - improved with diuresis. HTN - stable Anemia of CKD stage V - started on ESA, CTM 10/27 TSAT 10% needs IV Fe start 11/6 250mg  x4 CAD - extensive history, currently stable. CKD BMD: P and Ca ok, CTM Moderate protein malnutrition - protein supplementation Deconditioning - for SNF.  SW involved.  Disposition - CLIP in process; OK For DC once SNF and CLIP finalized.   Rexene Agent, MD  [facility]

## 2022-03-11 NOTE — Procedures (Signed)
   HEMODIALYSIS TREATMENT NOTE (HD#4):   Uneventful 3.5 hour heparin-free treatment completed. Goal met: 1.7 liters removed.  Ferric gluconate 235m given.  All blood was returned.  No changes from pre-HD assessment.    Post HD:  temp 97.8,  130/79,  p75,  r18,  spO2 99% on RA,  weight 74.3kg.  Hand-off given to TNigel Sloop LPN.   ARockwell Alexandria RN

## 2022-03-11 NOTE — Progress Notes (Signed)
Patient discharged to [facility], transported by [employer]. Discharge summary placed in discharge packet to give to receiving facility. Belongings sent with patient.

## 2022-03-12 ENCOUNTER — Encounter (HOSPITAL_COMMUNITY): Payer: Self-pay | Admitting: General Surgery

## 2022-03-13 DIAGNOSIS — E119 Type 2 diabetes mellitus without complications: Secondary | ICD-10-CM | POA: Diagnosis not present

## 2022-03-13 DIAGNOSIS — I10 Essential (primary) hypertension: Secondary | ICD-10-CM | POA: Diagnosis not present

## 2022-03-13 DIAGNOSIS — E559 Vitamin D deficiency, unspecified: Secondary | ICD-10-CM | POA: Diagnosis not present

## 2022-03-13 DIAGNOSIS — E785 Hyperlipidemia, unspecified: Secondary | ICD-10-CM | POA: Diagnosis not present

## 2022-03-14 DIAGNOSIS — N186 End stage renal disease: Secondary | ICD-10-CM | POA: Diagnosis not present

## 2022-03-14 DIAGNOSIS — L89152 Pressure ulcer of sacral region, stage 2: Secondary | ICD-10-CM | POA: Diagnosis not present

## 2022-03-14 DIAGNOSIS — E785 Hyperlipidemia, unspecified: Secondary | ICD-10-CM | POA: Diagnosis not present

## 2022-03-14 DIAGNOSIS — R5381 Other malaise: Secondary | ICD-10-CM | POA: Diagnosis not present

## 2022-03-14 DIAGNOSIS — I5032 Chronic diastolic (congestive) heart failure: Secondary | ICD-10-CM | POA: Diagnosis not present

## 2022-03-14 DIAGNOSIS — I110 Hypertensive heart disease with heart failure: Secondary | ICD-10-CM | POA: Diagnosis not present

## 2022-03-14 DIAGNOSIS — K2100 Gastro-esophageal reflux disease with esophagitis, without bleeding: Secondary | ICD-10-CM | POA: Diagnosis not present

## 2022-03-14 DIAGNOSIS — I739 Peripheral vascular disease, unspecified: Secondary | ICD-10-CM | POA: Diagnosis not present

## 2022-03-14 DIAGNOSIS — Z992 Dependence on renal dialysis: Secondary | ICD-10-CM | POA: Diagnosis not present

## 2022-03-14 DIAGNOSIS — D638 Anemia in other chronic diseases classified elsewhere: Secondary | ICD-10-CM | POA: Diagnosis not present

## 2022-03-14 DIAGNOSIS — I25810 Atherosclerosis of coronary artery bypass graft(s) without angina pectoris: Secondary | ICD-10-CM | POA: Diagnosis not present

## 2022-03-14 DIAGNOSIS — M109 Gout, unspecified: Secondary | ICD-10-CM | POA: Diagnosis not present

## 2022-03-17 DIAGNOSIS — Z992 Dependence on renal dialysis: Secondary | ICD-10-CM | POA: Diagnosis not present

## 2022-03-17 DIAGNOSIS — N186 End stage renal disease: Secondary | ICD-10-CM | POA: Diagnosis not present

## 2022-03-17 DIAGNOSIS — E785 Hyperlipidemia, unspecified: Secondary | ICD-10-CM | POA: Diagnosis not present

## 2022-03-17 DIAGNOSIS — E440 Moderate protein-calorie malnutrition: Secondary | ICD-10-CM | POA: Diagnosis not present

## 2022-03-17 DIAGNOSIS — M109 Gout, unspecified: Secondary | ICD-10-CM | POA: Diagnosis not present

## 2022-03-17 DIAGNOSIS — D638 Anemia in other chronic diseases classified elsewhere: Secondary | ICD-10-CM | POA: Diagnosis not present

## 2022-03-19 DIAGNOSIS — J9690 Respiratory failure, unspecified, unspecified whether with hypoxia or hypercapnia: Secondary | ICD-10-CM | POA: Diagnosis not present

## 2022-03-19 DIAGNOSIS — N179 Acute kidney failure, unspecified: Secondary | ICD-10-CM | POA: Diagnosis not present

## 2022-03-19 DIAGNOSIS — L89322 Pressure ulcer of left buttock, stage 2: Secondary | ICD-10-CM | POA: Diagnosis not present

## 2022-03-19 DIAGNOSIS — R5381 Other malaise: Secondary | ICD-10-CM | POA: Diagnosis not present

## 2022-03-19 DIAGNOSIS — D649 Anemia, unspecified: Secondary | ICD-10-CM | POA: Diagnosis not present

## 2022-03-19 DIAGNOSIS — I5032 Chronic diastolic (congestive) heart failure: Secondary | ICD-10-CM | POA: Diagnosis not present

## 2022-03-21 DIAGNOSIS — E785 Hyperlipidemia, unspecified: Secondary | ICD-10-CM | POA: Diagnosis not present

## 2022-03-21 DIAGNOSIS — I10 Essential (primary) hypertension: Secondary | ICD-10-CM | POA: Diagnosis not present

## 2022-03-24 DIAGNOSIS — D638 Anemia in other chronic diseases classified elsewhere: Secondary | ICD-10-CM | POA: Diagnosis not present

## 2022-03-24 DIAGNOSIS — I5032 Chronic diastolic (congestive) heart failure: Secondary | ICD-10-CM | POA: Diagnosis not present

## 2022-03-24 DIAGNOSIS — I739 Peripheral vascular disease, unspecified: Secondary | ICD-10-CM | POA: Diagnosis not present

## 2022-03-24 DIAGNOSIS — I25810 Atherosclerosis of coronary artery bypass graft(s) without angina pectoris: Secondary | ICD-10-CM | POA: Diagnosis not present

## 2022-03-24 DIAGNOSIS — M109 Gout, unspecified: Secondary | ICD-10-CM | POA: Diagnosis not present

## 2022-03-24 DIAGNOSIS — I110 Hypertensive heart disease with heart failure: Secondary | ICD-10-CM | POA: Diagnosis not present

## 2022-03-24 DIAGNOSIS — K2100 Gastro-esophageal reflux disease with esophagitis, without bleeding: Secondary | ICD-10-CM | POA: Diagnosis not present

## 2022-03-24 DIAGNOSIS — E785 Hyperlipidemia, unspecified: Secondary | ICD-10-CM | POA: Diagnosis not present

## 2022-03-24 DIAGNOSIS — N186 End stage renal disease: Secondary | ICD-10-CM | POA: Diagnosis not present

## 2022-03-25 DIAGNOSIS — I10 Essential (primary) hypertension: Secondary | ICD-10-CM | POA: Diagnosis not present

## 2022-03-26 DIAGNOSIS — N186 End stage renal disease: Secondary | ICD-10-CM | POA: Diagnosis not present

## 2022-03-26 DIAGNOSIS — I5032 Chronic diastolic (congestive) heart failure: Secondary | ICD-10-CM | POA: Diagnosis not present

## 2022-03-26 DIAGNOSIS — M109 Gout, unspecified: Secondary | ICD-10-CM | POA: Diagnosis not present

## 2022-03-26 DIAGNOSIS — I739 Peripheral vascular disease, unspecified: Secondary | ICD-10-CM | POA: Diagnosis not present

## 2022-03-26 DIAGNOSIS — L89152 Pressure ulcer of sacral region, stage 2: Secondary | ICD-10-CM | POA: Diagnosis not present

## 2022-03-26 DIAGNOSIS — I110 Hypertensive heart disease with heart failure: Secondary | ICD-10-CM | POA: Diagnosis not present

## 2022-03-26 DIAGNOSIS — E785 Hyperlipidemia, unspecified: Secondary | ICD-10-CM | POA: Diagnosis not present

## 2022-03-26 DIAGNOSIS — R5381 Other malaise: Secondary | ICD-10-CM | POA: Diagnosis not present

## 2022-03-26 DIAGNOSIS — Z992 Dependence on renal dialysis: Secondary | ICD-10-CM | POA: Diagnosis not present

## 2022-04-01 ENCOUNTER — Emergency Department (HOSPITAL_COMMUNITY)
Admission: EM | Admit: 2022-04-01 | Discharge: 2022-04-01 | Disposition: A | Discharge status: Skilled Nursing Facility | Payer: No Typology Code available for payment source | Attending: Emergency Medicine | Admitting: Emergency Medicine

## 2022-04-01 ENCOUNTER — Encounter (HOSPITAL_COMMUNITY): Payer: Self-pay

## 2022-04-01 ENCOUNTER — Other Ambulatory Visit: Payer: Self-pay

## 2022-04-01 DIAGNOSIS — Z992 Dependence on renal dialysis: Secondary | ICD-10-CM | POA: Diagnosis not present

## 2022-04-01 DIAGNOSIS — Z7982 Long term (current) use of aspirin: Secondary | ICD-10-CM | POA: Diagnosis not present

## 2022-04-01 DIAGNOSIS — N186 End stage renal disease: Secondary | ICD-10-CM | POA: Insufficient documentation

## 2022-04-01 DIAGNOSIS — N3001 Acute cystitis with hematuria: Secondary | ICD-10-CM | POA: Diagnosis not present

## 2022-04-01 DIAGNOSIS — R197 Diarrhea, unspecified: Secondary | ICD-10-CM | POA: Diagnosis not present

## 2022-04-01 DIAGNOSIS — R319 Hematuria, unspecified: Secondary | ICD-10-CM | POA: Diagnosis not present

## 2022-04-01 DIAGNOSIS — Z79899 Other long term (current) drug therapy: Secondary | ICD-10-CM | POA: Diagnosis not present

## 2022-04-01 DIAGNOSIS — I120 Hypertensive chronic kidney disease with stage 5 chronic kidney disease or end stage renal disease: Secondary | ICD-10-CM | POA: Diagnosis not present

## 2022-04-01 DIAGNOSIS — Z743 Need for continuous supervision: Secondary | ICD-10-CM | POA: Diagnosis not present

## 2022-04-01 DIAGNOSIS — R6889 Other general symptoms and signs: Secondary | ICD-10-CM | POA: Diagnosis not present

## 2022-04-01 DIAGNOSIS — N3091 Cystitis, unspecified with hematuria: Secondary | ICD-10-CM

## 2022-04-01 HISTORY — DX: End stage renal disease: N18.6

## 2022-04-01 LAB — URINALYSIS, ROUTINE W REFLEX MICROSCOPIC
Bilirubin Urine: NEGATIVE
Glucose, UA: NEGATIVE mg/dL
Ketones, ur: NEGATIVE mg/dL
Nitrite: NEGATIVE
Protein, ur: >=300 mg/dL — AB
RBC / HPF: >50 RBC/hpf — ABNORMAL HIGH (ref 0–5)
Specific Gravity, Urine: 1.017 (ref 1.005–1.030)
WBC, UA: >50 WBC/hpf — ABNORMAL HIGH (ref 0–5)
pH: 5 (ref 5.0–8.0)

## 2022-04-01 MED ORDER — CEPHALEXIN 500 MG PO CAPS: 500.0000 mg | ORAL_CAPSULE | Freq: Three times a day (TID) | ORAL | 0 refills | Status: AC

## 2022-04-01 MED ORDER — CEPHALEXIN 500 MG PO CAPS: 500.0000 mg | ORAL_CAPSULE | Freq: Three times a day (TID) | ORAL | 0 refills | Status: DC

## 2022-04-01 MED ORDER — CEPHALEXIN 500 MG PO CAPS
500.0000 mg | ORAL_CAPSULE | Freq: Once | ORAL | Status: AC
  Administered 2022-04-01: 500 mg via ORAL
  Filled 2022-04-01: qty 1

## 2022-04-01 NOTE — ED Notes (Addendum)
Patient continues to state that he cannot urinate. Drinking PO fluids at this time. Daughter at bedside and aware of need for specimen. To alert staff should patient void.

## 2022-04-01 NOTE — Discharge Instructions (Signed)
It appears that you may have a urinary infection that is causing your bleeding in your bladder.  I would like for you to take the medication called Keflex or cephalexin 3 times a day for the next 7 days.  This will treat for likely urinary infections.  The bleeding may be coming from something else so I want you to see a urologist within the next 2 weeks.  You may call the phone number above to make a follow-up appointment with our local urology group.  If you should develop worsening pain or difficulty urinating or if you are unable to urinate please return to the emergency department immediately as this would require a Foley catheter to be placed in your penis to allow the urine to come out.  If this does not happen you may develop kidney problems so if things get worse come back to the ER immediately.  In meantime please drink plenty of clear liquids to make sure that you have a good urinary flow

## 2022-04-01 NOTE — ED Provider Notes (Addendum)
[facility] EMERGENCY DEPARTMENT Provider Note   CSN: [id number] Arrival date & time: 04/01/22  1628     History  Chief Complaint  Patient presents with   Hematuria    Matthew [name] is a 76 y.o. male.   Hematuria   Patient is a 76 year old male, he has a history of high cholesterol on atorvastatin, hypertension on Coreg, he does take a baby aspirin, he also takes gabapentin for neuropathy.  The patient was diagnosed with end-stage renal disease and started on dialysis, he had a dialysis catheter that was inserted approximately 3-1/2 weeks ago.  He states that today while he was urinating (he still makes urine multiple times per day) he noticed that there was blood in the urine.  He was able to pass a stream without any pain dysuria or frequency or urgency.  The patient denies any abdominal pain fevers or chills, he has no nausea or vomiting, no diarrhea, no swelling of the legs.  He is otherwise feeling fine.  He arrives by paramedic transport with vital signs significant only for blood pressure of 166/107 but no tachycardia fever or hypoxia.    Home Medications Prior to Admission medications   Medication Sig Start Date End Date Taking? Authorizing Provider  acetaminophen (TYLENOL) 325 MG tablet Take 2 tablets (650 mg total) by mouth every 6 (six) hours as needed for mild pain. 01/03/18  Yes Elgie Collard, PA-C  aspirin EC 81 MG tablet Take 81 mg by mouth daily.   Yes [provider]  atorvastatin (LIPITOR) 80 MG tablet Take 1 tablet (80 mg total) by mouth daily at 6 PM. 01/03/18  Yes Harriet Pho, Tessa N, PA-C  cephALEXin (KEFLEX) 500 MG capsule Take 1 capsule (500 mg total) by mouth 3 (three) times daily for 7 days. 04/01/22 04/08/22 Yes Noemi Chapel, MD  famotidine (PEPCID) 20 MG tablet Take 20 mg by mouth daily.   Yes [provider]  febuxostat (ULORIC) 40 MG tablet Take 40 mg by mouth daily.   Yes [provider]  ferrous sulfate 325 (65 FE) MG tablet Take  325 mg by mouth every Monday, Wednesday, and Friday.    Yes [provider]  HYDROcodone-acetaminophen (NORCO/VICODIN) 5-325 MG tablet Take 1 tablet by mouth every 6 (six) hours as needed for moderate pain.   Yes [provider]  Zinc 50 MG TABS Take 1 tablet by mouth in the morning.   Yes [provider]  carvedilol (COREG) 25 MG tablet Take 1 tablet (25 mg total) by mouth 2 (two) times daily. Patient not taking: Reported on 04/01/2022 03/09/19 04/01/22  Arnoldo Lenis, MD  cholecalciferol (VITAMIN D3) 25 MCG (1000 UNIT) tablet Take 1,000 Units by mouth daily. Patient not taking: Reported on 04/01/2022    [provider]  furosemide (LASIX) 40 MG tablet Take 1 tablet (40 mg total) by mouth 2 (two) times daily. Patient not taking: Reported on 04/01/2022 03/09/22   Orson Eva, MD  gabapentin (NEURONTIN) 600 MG tablet Take 300 mg by mouth daily. Patient not taking: Reported on 04/01/2022    [provider]  Omega-3 Fatty Acids (OMEGA-3 FISH OIL) 300 MG CAPS Take 300 mg by mouth 2 (two) times daily. Patient not taking: Reported on 04/01/2022    [provider]  oxyCODONE (OXY IR/ROXICODONE) 5 MG immediate release tablet Take 1 tablet (5 mg total) by mouth every 6 (six) hours as needed for severe pain. Patient not taking: Reported on 04/01/2022 03/09/22  Orson Eva, MD      Allergies    Patient has no known allergies.    Review of Systems   Review of Systems  Genitourinary:  Positive for hematuria.  All other systems reviewed and are negative.   Physical Exam Updated Vital Signs BP 129/71   Pulse 95   Temp 98 F (36.7 C)   Resp 18   Ht 1.727 m (5\' 8" )   Wt 74.3 kg   SpO2 100%   BMI 24.91 kg/m  Physical Exam Vitals and nursing note reviewed.  Constitutional:      General: He is not in acute distress.    Appearance: He is well-developed.  HENT:     Head: Normocephalic and atraumatic.     Mouth/Throat:     Pharynx: No  oropharyngeal exudate.  Eyes:     General: No scleral icterus.       Right eye: No discharge.        Left eye: No discharge.     Conjunctiva/sclera: Conjunctivae normal.     Pupils: Pupils are equal, round, and reactive to light.  Neck:     Thyroid: No thyromegaly.     Vascular: No JVD.  Cardiovascular:     Rate and Rhythm: Normal rate and regular rhythm.     Heart sounds: Normal heart sounds. No murmur heard.    No friction rub. No gallop.  Pulmonary:     Effort: Pulmonary effort is normal. No respiratory distress.     Breath sounds: Normal breath sounds. No wheezing or rales.  Abdominal:     General: Bowel sounds are normal. There is no distension.     Palpations: Abdomen is soft. There is no mass.     Tenderness: There is no abdominal tenderness.  Genitourinary:    Comments: Genitourinary exam shows normal-appearing penis scrotum and testicles.  There is no signs of blood or discharge at the urethral meatus.  His abdomen is completely nontender, there is no hernias Musculoskeletal:        General: No tenderness. Normal range of motion.     Cervical back: Normal range of motion and neck supple.     Right lower leg: No edema.     Left lower leg: No edema.  Lymphadenopathy:     Cervical: No cervical adenopathy.  Skin:    General: Skin is warm and dry.     Findings: No erythema or rash.  Neurological:     Mental Status: He is alert.     Coordination: Coordination normal.  Psychiatric:        Behavior: Behavior normal.     ED Results / Procedures / Treatments   Labs (all labs ordered are listed, but only abnormal results are displayed) Labs Reviewed  URINALYSIS, ROUTINE W REFLEX MICROSCOPIC - Abnormal; Notable for the following components:      Result Value   Color, Urine AMBER (*)    APPearance CLOUDY (*)    Hgb urine dipstick LARGE (*)    Protein, ur >=300 (*)    Leukocytes,Ua MODERATE (*)    RBC / HPF >50 (*)    WBC, UA >50 (*)    Bacteria, UA MANY (*)    All  other components within normal limits  URINE CULTURE    EKG None  Radiology No results found.  Procedures Procedures    Medications Ordered in ED Medications  cephALEXin (KEFLEX) capsule 500 mg (has no administration in time range)    ED Course/  Medical Decision Making/ A&P                           Medical Decision Making Amount and/or Complexity of Data Reviewed Labs: ordered.  Risk Prescription drug management.   This patient presents to the ED for concern of hematuria differential diagnosis includes hemorrhagic cystitis, infectious cystitis, coagulopathy, trauma, the patient denies any of the ladder    Additional history obtained:  Additional history obtained from electronic medical record External records from outside source obtained and reviewed including confirmation of the patient's recent procedures to insert dialysis catheter   Lab Tests:  I Ordered, and personally interpreted labs.  The pertinent results include: Urinalysis, too numerous to count red cells white cells and bacteria    Medicines ordered and prescription drug management:  I ordered medication including cephalexin for urinary tract infection and hemorrhagic cystitis Reevaluation of the patient after these medicines showed that the patient stable I have reviewed the patients home medicines and have made adjustments as needed   Problem List / ED Course:  Patient presents with hemorrhagic cystitis, antibiotics given, culture sent, stable for discharge, understands indications for return.  He is able to pass urine freely and does not have an obstructed bladder.  He is aware that he may need to return for a Foley catheter if he cannot be at home   Social Determinants of Health:  On dialysis recently still making urine, has follow-up with urologist within several days, daughter has arrived to drive him home and has expressed understanding to all of the instructions including the reasons to  come back.           Final Clinical Impression(s) / ED Diagnoses Final diagnoses:  Hemorrhagic cystitis    Rx / DC Orders ED Discharge Orders          Ordered    cephALEXin (KEFLEX) 500 MG capsule  3 times daily        04/01/22 2032              Noemi Chapel, MD 04/01/22 2034    Noemi Chapel, MD 04/01/22 2039

## 2022-04-01 NOTE — ED Notes (Signed)
Drinking water at this time. States he is still unable to provide urine sample.

## 2022-04-01 NOTE — ED Triage Notes (Signed)
Patient here via EMS from [place] with complaints of hematuria that started today. Denies pain or discomfort. Patient recently started dialysis and states that he has been hypertensive since then. Diarrhea reported last night.

## 2022-04-03 DIAGNOSIS — N186 End stage renal disease: Secondary | ICD-10-CM | POA: Diagnosis not present

## 2022-04-03 DIAGNOSIS — Z952 Presence of prosthetic heart valve: Secondary | ICD-10-CM | POA: Diagnosis not present

## 2022-04-03 DIAGNOSIS — Z951 Presence of aortocoronary bypass graft: Secondary | ICD-10-CM | POA: Diagnosis not present

## 2022-04-03 DIAGNOSIS — R7303 Prediabetes: Secondary | ICD-10-CM | POA: Diagnosis not present

## 2022-04-03 DIAGNOSIS — I130 Hypertensive heart and chronic kidney disease with heart failure and stage 1 through stage 4 chronic kidney disease, or unspecified chronic kidney disease: Secondary | ICD-10-CM | POA: Diagnosis not present

## 2022-04-03 DIAGNOSIS — M109 Gout, unspecified: Secondary | ICD-10-CM | POA: Diagnosis not present

## 2022-04-03 DIAGNOSIS — G5793 Unspecified mononeuropathy of bilateral lower limbs: Secondary | ICD-10-CM | POA: Diagnosis not present

## 2022-04-03 DIAGNOSIS — Z7982 Long term (current) use of aspirin: Secondary | ICD-10-CM | POA: Diagnosis not present

## 2022-04-03 DIAGNOSIS — G4730 Sleep apnea, unspecified: Secondary | ICD-10-CM | POA: Diagnosis not present

## 2022-04-03 DIAGNOSIS — I739 Peripheral vascular disease, unspecified: Secondary | ICD-10-CM | POA: Diagnosis not present

## 2022-04-03 DIAGNOSIS — Z992 Dependence on renal dialysis: Secondary | ICD-10-CM | POA: Diagnosis not present

## 2022-04-03 DIAGNOSIS — Z9181 History of falling: Secondary | ICD-10-CM | POA: Diagnosis not present

## 2022-04-03 DIAGNOSIS — I2510 Atherosclerotic heart disease of native coronary artery without angina pectoris: Secondary | ICD-10-CM | POA: Diagnosis not present

## 2022-04-03 DIAGNOSIS — N179 Acute kidney failure, unspecified: Secondary | ICD-10-CM | POA: Diagnosis not present

## 2022-04-03 DIAGNOSIS — I5032 Chronic diastolic (congestive) heart failure: Secondary | ICD-10-CM | POA: Diagnosis not present

## 2022-04-03 DIAGNOSIS — K219 Gastro-esophageal reflux disease without esophagitis: Secondary | ICD-10-CM | POA: Diagnosis not present

## 2022-04-03 DIAGNOSIS — E785 Hyperlipidemia, unspecified: Secondary | ICD-10-CM | POA: Diagnosis not present

## 2022-04-03 DIAGNOSIS — M1990 Unspecified osteoarthritis, unspecified site: Secondary | ICD-10-CM | POA: Diagnosis not present

## 2022-04-03 DIAGNOSIS — F17210 Nicotine dependence, cigarettes, uncomplicated: Secondary | ICD-10-CM | POA: Diagnosis not present

## 2022-04-03 DIAGNOSIS — D631 Anemia in chronic kidney disease: Secondary | ICD-10-CM | POA: Diagnosis not present

## 2022-04-03 LAB — URINE CULTURE: Culture: NO GROWTH

## 2022-04-07 ENCOUNTER — Telehealth: Payer: Self-pay

## 2022-04-07 NOTE — Telephone Encounter (Signed)
        Patient  visited [facility] on 11/28     Telephone encounter attempt :  1st  A HIPAA compliant voice message was left requesting a return call.  Instructed patient to call back     Jamarious Febo [facility] Guide, [place], Care Management  [phone] [address], [place], [place] [address] Phone: [phone] Email: [email or web address]       

## 2022-04-08 ENCOUNTER — Telehealth: Payer: Self-pay

## 2022-04-08 DIAGNOSIS — Z952 Presence of prosthetic heart valve: Secondary | ICD-10-CM | POA: Diagnosis not present

## 2022-04-08 DIAGNOSIS — I5032 Chronic diastolic (congestive) heart failure: Secondary | ICD-10-CM | POA: Diagnosis not present

## 2022-04-08 DIAGNOSIS — K219 Gastro-esophageal reflux disease without esophagitis: Secondary | ICD-10-CM | POA: Diagnosis not present

## 2022-04-08 DIAGNOSIS — N186 End stage renal disease: Secondary | ICD-10-CM | POA: Diagnosis not present

## 2022-04-08 DIAGNOSIS — D631 Anemia in chronic kidney disease: Secondary | ICD-10-CM | POA: Diagnosis not present

## 2022-04-08 DIAGNOSIS — I739 Peripheral vascular disease, unspecified: Secondary | ICD-10-CM | POA: Diagnosis not present

## 2022-04-08 DIAGNOSIS — G5793 Unspecified mononeuropathy of bilateral lower limbs: Secondary | ICD-10-CM | POA: Diagnosis not present

## 2022-04-08 DIAGNOSIS — G4730 Sleep apnea, unspecified: Secondary | ICD-10-CM | POA: Diagnosis not present

## 2022-04-08 DIAGNOSIS — M109 Gout, unspecified: Secondary | ICD-10-CM | POA: Diagnosis not present

## 2022-04-08 DIAGNOSIS — F17210 Nicotine dependence, cigarettes, uncomplicated: Secondary | ICD-10-CM | POA: Diagnosis not present

## 2022-04-08 DIAGNOSIS — R7303 Prediabetes: Secondary | ICD-10-CM | POA: Diagnosis not present

## 2022-04-08 DIAGNOSIS — Z7982 Long term (current) use of aspirin: Secondary | ICD-10-CM | POA: Diagnosis not present

## 2022-04-08 DIAGNOSIS — Z992 Dependence on renal dialysis: Secondary | ICD-10-CM | POA: Diagnosis not present

## 2022-04-08 DIAGNOSIS — Z9181 History of falling: Secondary | ICD-10-CM | POA: Diagnosis not present

## 2022-04-08 DIAGNOSIS — M1990 Unspecified osteoarthritis, unspecified site: Secondary | ICD-10-CM | POA: Diagnosis not present

## 2022-04-08 DIAGNOSIS — Z951 Presence of aortocoronary bypass graft: Secondary | ICD-10-CM | POA: Diagnosis not present

## 2022-04-08 DIAGNOSIS — I2510 Atherosclerotic heart disease of native coronary artery without angina pectoris: Secondary | ICD-10-CM | POA: Diagnosis not present

## 2022-04-08 DIAGNOSIS — N179 Acute kidney failure, unspecified: Secondary | ICD-10-CM | POA: Diagnosis not present

## 2022-04-08 DIAGNOSIS — E785 Hyperlipidemia, unspecified: Secondary | ICD-10-CM | POA: Diagnosis not present

## 2022-04-08 DIAGNOSIS — I130 Hypertensive heart and chronic kidney disease with heart failure and stage 1 through stage 4 chronic kidney disease, or unspecified chronic kidney disease: Secondary | ICD-10-CM | POA: Diagnosis not present

## 2022-04-08 NOTE — Telephone Encounter (Signed)
        Patient  visited [facility] on 11/28     Telephone encounter attempt :  2nd  A HIPAA compliant voice message was left requesting a return call.  Instructed patient to call back   Matthew [name] [facility] Guide, [place], Care Management  [phone] [address], [place], [place] [address] Phone: [phone] Email: [email or web address]       

## 2022-04-09 ENCOUNTER — Encounter: Payer: Medicare Other | Admitting: Vascular Surgery

## 2022-04-09 ENCOUNTER — Encounter: Payer: Self-pay | Admitting: Vascular Surgery

## 2022-04-09 ENCOUNTER — Ambulatory Visit (INDEPENDENT_AMBULATORY_CARE_PROVIDER_SITE_OTHER): Payer: Medicare Other | Admitting: Vascular Surgery

## 2022-04-09 VITALS — BP 113/67 | HR 124 | Temp 98.0°F | Ht 68.0 in | Wt 181.2 lb

## 2022-04-09 DIAGNOSIS — N186 End stage renal disease: Secondary | ICD-10-CM | POA: Diagnosis not present

## 2022-04-09 DIAGNOSIS — Z992 Dependence on renal dialysis: Secondary | ICD-10-CM | POA: Diagnosis not present

## 2022-04-09 NOTE — H&P (View-Only) (Signed)
Vascular and Vein Specialist of [place]  Patient name: Matthew [name] MRN: [id number] DOB: Mar 19, 1946 Sex: male  REASON FOR VISIT: Discuss access for hemodialysis  HPI: Matthew [name] is a 76 y.o. male here today for discussion of access for hemodialysis.  He is well-known to me from prior history of peripheral vascular occlusive disease.  He underwent left femoral to posterior tibial bypass in 2017 for critical limb ischemia.  Is also status post right carotid endarterectomy in the past.  He has had chronic renal insufficiency and is now progressed to end-stage renal disease.  He has a right IJ tunnel catheter which was placed by interventional radiology on 03/06/2022.  He has dialysis at [facility].  He is not on anticoagulant therapy.  He does not have a pacemaker.  He is right-handed.  Past Medical History:  Diagnosis Date   ACS (acute coronary syndrome) ([place]) 10/2018   Arthritis    Bladder stones    BPH (benign prostatic hyperplasia)    CAD (coronary artery disease)    a. Pt reports prior stenting @ [facility] - no records in Epic.   Chronic kidney disease (CKD), stage III (moderate) (HCC)    dx from [place] in [place],    End stage chronic kidney disease ([place])    Hyperlipidemia    Hypertension    Moderate to severe aortic stenosis    a. PER [facility]-15-16 [facility].   Neuropathy    FEET AND HANDS   Peripheral vascular disease ([place])    a. 04/2016 s/p L Fem->PT bypass; b. 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 04/2017 LLE Duplex: No restenosis. ABI R 0.63, ABI L 1.01.   Post traumatic stress disorder (PTSD)    Pre-diabetes    Sleep apnea    does not use cpap    Family History  Problem Relation Age of Onset   Leukemia Sister    Lung cancer Brother    Heart failure Mother        died @ 20   Diabetes Father        died @ 47   Heart attack Father     SOCIAL HISTORY: Social History   Tobacco Use   Smoking status:  Every Day    Packs/day: 0.50    Years: 50.00    Total pack years: 25.00    Types: Cigarettes    Start date: 06/15/1962   Smokeless tobacco: Never  Substance Use Topics   Alcohol use: Never    No Known Allergies  Current Outpatient Medications  Medication Sig Dispense Refill   acetaminophen (TYLENOL) 325 MG tablet Take 2 tablets (650 mg total) by mouth every 6 (six) hours as needed for mild pain.     aspirin EC 81 MG tablet Take 81 mg by mouth daily.     atorvastatin (LIPITOR) 80 MG tablet Take 1 tablet (80 mg total) by mouth daily at 6 PM. 30 tablet 1   carvedilol (COREG) 25 MG tablet Take 1 tablet (25 mg total) by mouth 2 (two) times daily. 180 tablet 1   famotidine (PEPCID) 20 MG tablet Take 20 mg by mouth daily.     febuxostat (ULORIC) 40 MG tablet Take 40 mg by mouth daily.     ferrous sulfate 325 (65 FE) MG tablet Take 325 mg by mouth every Monday, Wednesday, and Friday.      furosemide (LASIX) 40 MG tablet Take 1 tablet (40 mg total) by mouth 2 (two) times  daily. 30 tablet    gabapentin (NEURONTIN) 600 MG tablet Take 300 mg by mouth daily.     Omega-3 Fatty Acids (OMEGA-3 FISH OIL) 300 MG CAPS Take 300 mg by mouth 2 (two) times daily.     oxyCODONE (OXY IR/ROXICODONE) 5 MG immediate release tablet Take 1 tablet (5 mg total) by mouth every 6 (six) hours as needed for severe pain. 12 tablet 0   Zinc 50 MG TABS Take 1 tablet by mouth in the morning.     cholecalciferol (VITAMIN D3) 25 MCG (1000 UNIT) tablet Take 1,000 Units by mouth daily. (Patient not taking: Reported on 04/01/2022)     HYDROcodone-acetaminophen (NORCO/VICODIN) 5-325 MG tablet Take 1 tablet by mouth every 6 (six) hours as needed for moderate pain. (Patient not taking: Reported on 04/09/2022)     No current facility-administered medications for this visit.    REVIEW OF SYSTEMS:  [X]  denotes positive finding, [ ]  denotes negative finding Cardiac  Comments:  Chest pain or chest pressure:    Shortness of breath  upon exertion:    Short of breath when lying flat:    Irregular heart rhythm:        Vascular    Pain in calf, thigh, or hip brought on by ambulation:    Pain in feet at night that wakes you up from your sleep:     Blood clot in your veins:    Leg swelling:           PHYSICAL EXAM: Vitals:   04/09/22 1524  BP: 113/67  Pulse: (!) 124  Temp: 98 F (36.7 C)  SpO2: 99%  Weight: 181 lb 3.2 oz (82.2 kg)  Height: 5\' 8"  (1.727 m)    GENERAL: The patient is a well-nourished male, in no acute distress. The vital signs are documented above. CARDIOVASCULAR: 2+ radial pulses bilaterally.  Small surface veins by physical exam bilaterally. PULMONARY: There is good air exchange  MUSCULOSKELETAL: There are no major deformities or cyanosis. NEUROLOGIC: No focal weakness or paresthesias are detected. SKIN: There are no ulcers or rashes noted. PSYCHIATRIC: The patient has a normal affect.  DATA:  I imaged both arms with SonoSite ultrasound.  He has small cephalic vein in the forearm and upper arm bilaterally.  He does have a moderate size basilic vein at the antecubital space and proximally in his upper arm.  MEDICAL ISSUES: Had a long discussion with patient regarding options for hemodialysis access.  I discussed the risk of long-term use of his catheter.  I did discuss AV fistula and AV graft.  I feel that he would be a good candidate for left arm basilic vein fistula.  Explained this would require 2 stages.  I explained the initial stage would be brachiobasilic fistula creation followed up with an office visit in 4 to 6 weeks.  Assuming adequate size maturation we would then proceed with translocation of his basilic vein.  We will plan is for stage brachiobasilic fistula as an outpatient at [facility] on 04/22/2022    [name], MD FACS [facility] and Vein Specialists of [facility] [phone]  Note: Portions of this report may have been transcribed using voice  recognition software.  Every effort has been made to ensure accuracy; however, inadvertent computerized transcription errors may still be present.

## 2022-04-09 NOTE — Progress Notes (Signed)
Vascular and Vein Specialist of [place]  Patient name: Matthew [name] MRN: [id number] DOB: 09-08-1945 Sex: male  REASON FOR VISIT: Discuss access for hemodialysis  HPI: Matthew [name] is a 76 y.o. male here today for discussion of access for hemodialysis.  He is well-known to me from prior history of peripheral vascular occlusive disease.  He underwent left femoral to posterior tibial bypass in 2017 for critical limb ischemia.  Is also status post right carotid endarterectomy in the past.  He has had chronic renal insufficiency and is now progressed to end-stage renal disease.  He has a right IJ tunnel catheter which was placed by interventional radiology on 03/06/2022.  He has dialysis at [facility].  He is not on anticoagulant therapy.  He does not have a pacemaker.  He is right-handed.  Past Medical History:  Diagnosis Date   ACS (acute coronary syndrome) ([place]) 10/2018   Arthritis    Bladder stones    BPH (benign prostatic hyperplasia)    CAD (coronary artery disease)    a. Pt reports prior stenting @ [facility] - no records in Epic.   Chronic kidney disease (CKD), stage III (moderate) (HCC)    dx from [place] in [place], [place]   End stage chronic kidney disease ([place])    Hyperlipidemia    Hypertension    Moderate to severe aortic stenosis    a. [facility] [place].   Neuropathy    FEET AND HANDS   Peripheral vascular disease ([place])    a. 04/2016 s/p L Fem->PT bypass; b. 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 04/2017 LLE Duplex: No restenosis. ABI R 0.63, ABI L 1.01.   Post traumatic stress disorder (PTSD)    Pre-diabetes    Sleep apnea    does not use cpap    Family History  Problem Relation Age of Onset   Leukemia Sister    Lung cancer Brother    Heart failure Mother        died @ 38   Diabetes Father        died @ 92   Heart attack Father     SOCIAL HISTORY: Social History   Tobacco Use   Smoking status:  Every Day    Packs/day: 0.50    Years: 50.00    Total pack years: 25.00    Types: Cigarettes    Start date: 06/15/1962   Smokeless tobacco: Never  Substance Use Topics   Alcohol use: Never    No Known Allergies  Current Outpatient Medications  Medication Sig Dispense Refill   acetaminophen (TYLENOL) 325 MG tablet Take 2 tablets (650 mg total) by mouth every 6 (six) hours as needed for mild pain.     aspirin EC 81 MG tablet Take 81 mg by mouth daily.     atorvastatin (LIPITOR) 80 MG tablet Take 1 tablet (80 mg total) by mouth daily at 6 PM. 30 tablet 1   carvedilol (COREG) 25 MG tablet Take 1 tablet (25 mg total) by mouth 2 (two) times daily. 180 tablet 1   famotidine (PEPCID) 20 MG tablet Take 20 mg by mouth daily.     febuxostat (ULORIC) 40 MG tablet Take 40 mg by mouth daily.     ferrous sulfate 325 (65 FE) MG tablet Take 325 mg by mouth every Monday, Wednesday, and Friday.      furosemide (LASIX) 40 MG tablet Take 1 tablet (40 mg total) by mouth 2 (two) times  daily. 30 tablet    gabapentin (NEURONTIN) 600 MG tablet Take 300 mg by mouth daily.     Omega-3 Fatty Acids (OMEGA-3 FISH OIL) 300 MG CAPS Take 300 mg by mouth 2 (two) times daily.     oxyCODONE (OXY IR/ROXICODONE) 5 MG immediate release tablet Take 1 tablet (5 mg total) by mouth every 6 (six) hours as needed for severe pain. 12 tablet 0   Zinc 50 MG TABS Take 1 tablet by mouth in the morning.     cholecalciferol (VITAMIN D3) 25 MCG (1000 UNIT) tablet Take 1,000 Units by mouth daily. (Patient not taking: Reported on 04/01/2022)     HYDROcodone-acetaminophen (NORCO/VICODIN) 5-325 MG tablet Take 1 tablet by mouth every 6 (six) hours as needed for moderate pain. (Patient not taking: Reported on 04/09/2022)     No current facility-administered medications for this visit.    REVIEW OF SYSTEMS:  [X]  denotes positive finding, [ ]  denotes negative finding Cardiac  Comments:  Chest pain or chest pressure:    Shortness of breath  upon exertion:    Short of breath when lying flat:    Irregular heart rhythm:        Vascular    Pain in calf, thigh, or hip brought on by ambulation:    Pain in feet at night that wakes you up from your sleep:     Blood clot in your veins:    Leg swelling:           PHYSICAL EXAM: Vitals:   04/09/22 1524  BP: 113/67  Pulse: (!) 124  Temp: 98 F (36.7 C)  SpO2: 99%  Weight: 181 lb 3.2 oz (82.2 kg)  Height: 5\' 8"  (1.727 m)    GENERAL: The patient is a well-nourished male, in no acute distress. The vital signs are documented above. CARDIOVASCULAR: 2+ radial pulses bilaterally.  Small surface veins by physical exam bilaterally. PULMONARY: There is good air exchange  MUSCULOSKELETAL: There are no major deformities or cyanosis. NEUROLOGIC: No focal weakness or paresthesias are detected. SKIN: There are no ulcers or rashes noted. PSYCHIATRIC: The patient has a normal affect.  DATA:  I imaged both arms with SonoSite ultrasound.  He has small cephalic vein in the forearm and upper arm bilaterally.  He does have a moderate size basilic vein at the antecubital space and proximally in his upper arm.  MEDICAL ISSUES: Had a long discussion with patient regarding options for hemodialysis access.  I discussed the risk of long-term use of his catheter.  I did discuss AV fistula and AV graft.  I feel that he would be a good candidate for left arm basilic vein fistula.  Explained this would require 2 stages.  I explained the initial stage would be brachiobasilic fistula creation followed up with an office visit in 4 to 6 weeks.  Assuming adequate size maturation we would then proceed with translocation of his basilic vein.  We will plan is for stage brachiobasilic fistula as an outpatient at [facility] on 04/22/2022    [name], MD FACS Vascular and Vein Specialists of [facility] [phone]  Note: Portions of this report may have been transcribed using voice  recognition software.  Every effort has been made to ensure accuracy; however, inadvertent computerized transcription errors may still be present.

## 2022-04-10 ENCOUNTER — Telehealth: Payer: Self-pay

## 2022-04-10 NOTE — Telephone Encounter (Signed)
Attempted to reach patient to schedule left arm AVF/AVG but received message stating, call cannot be completed as dialed on multiple tries.  DPR on file for daughter, Camille Thau. Called placed, no answer. Left message for her or patient to return call.

## 2022-04-11 ENCOUNTER — Other Ambulatory Visit: Payer: Self-pay

## 2022-04-11 DIAGNOSIS — N186 End stage renal disease: Secondary | ICD-10-CM

## 2022-04-11 NOTE — Telephone Encounter (Signed)
Spoke with patient at [facility] regarding scheduling surgery on 04/22/22 at [facility]. Instructions reviewed. Patient verbalized understanding. Instructions also faxed to dialysis center at [phone].

## 2022-04-11 NOTE — Telephone Encounter (Signed)
Call placed to patient but call still would not go through.   Called [name]. Informed patient's chair time is around 1045 AM. Advised I will call back later once patient arrives.

## 2022-04-15 DIAGNOSIS — I739 Peripheral vascular disease, unspecified: Secondary | ICD-10-CM | POA: Diagnosis not present

## 2022-04-15 DIAGNOSIS — F17210 Nicotine dependence, cigarettes, uncomplicated: Secondary | ICD-10-CM | POA: Diagnosis not present

## 2022-04-15 DIAGNOSIS — N179 Acute kidney failure, unspecified: Secondary | ICD-10-CM | POA: Diagnosis not present

## 2022-04-15 DIAGNOSIS — G4730 Sleep apnea, unspecified: Secondary | ICD-10-CM | POA: Diagnosis not present

## 2022-04-15 DIAGNOSIS — Z951 Presence of aortocoronary bypass graft: Secondary | ICD-10-CM | POA: Diagnosis not present

## 2022-04-15 DIAGNOSIS — M1990 Unspecified osteoarthritis, unspecified site: Secondary | ICD-10-CM | POA: Diagnosis not present

## 2022-04-15 DIAGNOSIS — I5032 Chronic diastolic (congestive) heart failure: Secondary | ICD-10-CM | POA: Diagnosis not present

## 2022-04-15 DIAGNOSIS — N186 End stage renal disease: Secondary | ICD-10-CM | POA: Diagnosis not present

## 2022-04-15 DIAGNOSIS — D631 Anemia in chronic kidney disease: Secondary | ICD-10-CM | POA: Diagnosis not present

## 2022-04-15 DIAGNOSIS — M109 Gout, unspecified: Secondary | ICD-10-CM | POA: Diagnosis not present

## 2022-04-15 DIAGNOSIS — I2510 Atherosclerotic heart disease of native coronary artery without angina pectoris: Secondary | ICD-10-CM | POA: Diagnosis not present

## 2022-04-15 DIAGNOSIS — R7303 Prediabetes: Secondary | ICD-10-CM | POA: Diagnosis not present

## 2022-04-15 DIAGNOSIS — Z7982 Long term (current) use of aspirin: Secondary | ICD-10-CM | POA: Diagnosis not present

## 2022-04-15 DIAGNOSIS — Z952 Presence of prosthetic heart valve: Secondary | ICD-10-CM | POA: Diagnosis not present

## 2022-04-15 DIAGNOSIS — G5793 Unspecified mononeuropathy of bilateral lower limbs: Secondary | ICD-10-CM | POA: Diagnosis not present

## 2022-04-15 DIAGNOSIS — Z9181 History of falling: Secondary | ICD-10-CM | POA: Diagnosis not present

## 2022-04-15 DIAGNOSIS — I130 Hypertensive heart and chronic kidney disease with heart failure and stage 1 through stage 4 chronic kidney disease, or unspecified chronic kidney disease: Secondary | ICD-10-CM | POA: Diagnosis not present

## 2022-04-15 DIAGNOSIS — Z992 Dependence on renal dialysis: Secondary | ICD-10-CM | POA: Diagnosis not present

## 2022-04-15 DIAGNOSIS — E785 Hyperlipidemia, unspecified: Secondary | ICD-10-CM | POA: Diagnosis not present

## 2022-04-15 DIAGNOSIS — K219 Gastro-esophageal reflux disease without esophagitis: Secondary | ICD-10-CM | POA: Diagnosis not present

## 2022-04-18 ENCOUNTER — Encounter (HOSPITAL_COMMUNITY)
Admission: RE | Admit: 2022-04-18 | Discharge: 2022-04-18 | Disposition: A | Discharge status: Home / Self Care | Payer: Medicare Other | Source: Ambulatory Visit | Attending: Vascular Surgery | Admitting: Vascular Surgery

## 2022-04-18 ENCOUNTER — Other Ambulatory Visit: Payer: Self-pay

## 2022-04-18 ENCOUNTER — Encounter (HOSPITAL_COMMUNITY): Payer: Self-pay

## 2022-04-18 VITALS — Ht 68.0 in | Wt 181.2 lb

## 2022-04-18 DIAGNOSIS — N184 Chronic kidney disease, stage 4 (severe): Secondary | ICD-10-CM

## 2022-04-18 DIAGNOSIS — N186 End stage renal disease: Secondary | ICD-10-CM

## 2022-04-18 NOTE — Pre-Procedure Instructions (Signed)
Patient is a resident at [facility], on the Independent side of the facility. I spoke with him and know arrival times and meds from Dr Luther Parody office. Son-in-law to bring hime and he will bring telephone number with him on Tuesday.

## 2022-04-22 ENCOUNTER — Encounter (HOSPITAL_COMMUNITY)
Admission: RE | Disposition: A | Discharge status: Home / Self Care | Payer: Self-pay | Source: Home / Self Care | Attending: Vascular Surgery

## 2022-04-22 ENCOUNTER — Other Ambulatory Visit: Payer: Self-pay

## 2022-04-22 ENCOUNTER — Ambulatory Visit (HOSPITAL_COMMUNITY): Payer: Medicare Other | Admitting: Anesthesiology

## 2022-04-22 ENCOUNTER — Ambulatory Visit (HOSPITAL_BASED_OUTPATIENT_CLINIC_OR_DEPARTMENT_OTHER): Payer: Medicare Other | Admitting: Anesthesiology

## 2022-04-22 ENCOUNTER — Encounter (HOSPITAL_COMMUNITY): Payer: Self-pay | Admitting: Vascular Surgery

## 2022-04-22 ENCOUNTER — Ambulatory Visit (HOSPITAL_COMMUNITY)
Admission: RE | Admit: 2022-04-22 | Discharge: 2022-04-22 | Disposition: A | Discharge status: Home / Self Care | Payer: Medicare Other | Attending: Vascular Surgery | Admitting: Vascular Surgery

## 2022-04-22 DIAGNOSIS — I252 Old myocardial infarction: Secondary | ICD-10-CM | POA: Diagnosis not present

## 2022-04-22 DIAGNOSIS — D631 Anemia in chronic kidney disease: Secondary | ICD-10-CM | POA: Diagnosis not present

## 2022-04-22 DIAGNOSIS — R7303 Prediabetes: Secondary | ICD-10-CM | POA: Diagnosis not present

## 2022-04-22 DIAGNOSIS — Z951 Presence of aortocoronary bypass graft: Secondary | ICD-10-CM | POA: Insufficient documentation

## 2022-04-22 DIAGNOSIS — F17210 Nicotine dependence, cigarettes, uncomplicated: Secondary | ICD-10-CM

## 2022-04-22 DIAGNOSIS — I2510 Atherosclerotic heart disease of native coronary artery without angina pectoris: Secondary | ICD-10-CM

## 2022-04-22 DIAGNOSIS — G4730 Sleep apnea, unspecified: Secondary | ICD-10-CM | POA: Diagnosis not present

## 2022-04-22 DIAGNOSIS — Z992 Dependence on renal dialysis: Secondary | ICD-10-CM | POA: Diagnosis not present

## 2022-04-22 DIAGNOSIS — Z952 Presence of prosthetic heart valve: Secondary | ICD-10-CM | POA: Diagnosis not present

## 2022-04-22 DIAGNOSIS — N184 Chronic kidney disease, stage 4 (severe): Secondary | ICD-10-CM

## 2022-04-22 DIAGNOSIS — Z7982 Long term (current) use of aspirin: Secondary | ICD-10-CM | POA: Diagnosis not present

## 2022-04-22 DIAGNOSIS — M109 Gout, unspecified: Secondary | ICD-10-CM | POA: Diagnosis not present

## 2022-04-22 DIAGNOSIS — N186 End stage renal disease: Secondary | ICD-10-CM | POA: Diagnosis not present

## 2022-04-22 DIAGNOSIS — G5793 Unspecified mononeuropathy of bilateral lower limbs: Secondary | ICD-10-CM | POA: Diagnosis not present

## 2022-04-22 DIAGNOSIS — N179 Acute kidney failure, unspecified: Secondary | ICD-10-CM

## 2022-04-22 DIAGNOSIS — I998 Other disorder of circulatory system: Secondary | ICD-10-CM | POA: Diagnosis not present

## 2022-04-22 DIAGNOSIS — I130 Hypertensive heart and chronic kidney disease with heart failure and stage 1 through stage 4 chronic kidney disease, or unspecified chronic kidney disease: Secondary | ICD-10-CM | POA: Diagnosis not present

## 2022-04-22 DIAGNOSIS — M1990 Unspecified osteoarthritis, unspecified site: Secondary | ICD-10-CM | POA: Diagnosis not present

## 2022-04-22 DIAGNOSIS — Z9181 History of falling: Secondary | ICD-10-CM | POA: Diagnosis not present

## 2022-04-22 DIAGNOSIS — I120 Hypertensive chronic kidney disease with stage 5 chronic kidney disease or end stage renal disease: Secondary | ICD-10-CM | POA: Diagnosis not present

## 2022-04-22 DIAGNOSIS — I5032 Chronic diastolic (congestive) heart failure: Secondary | ICD-10-CM | POA: Diagnosis not present

## 2022-04-22 DIAGNOSIS — N185 Chronic kidney disease, stage 5: Secondary | ICD-10-CM | POA: Diagnosis not present

## 2022-04-22 DIAGNOSIS — I739 Peripheral vascular disease, unspecified: Secondary | ICD-10-CM | POA: Diagnosis not present

## 2022-04-22 DIAGNOSIS — E785 Hyperlipidemia, unspecified: Secondary | ICD-10-CM | POA: Diagnosis not present

## 2022-04-22 DIAGNOSIS — K219 Gastro-esophageal reflux disease without esophagitis: Secondary | ICD-10-CM | POA: Diagnosis not present

## 2022-04-22 HISTORY — PX: AV FISTULA PLACEMENT: SHX1204

## 2022-04-22 LAB — BASIC METABOLIC PANEL
Anion gap: 12 (ref 5–15)
BUN: 20 mg/dL (ref 8–23)
CO2: 27 mmol/L (ref 22–32)
Calcium: 9.2 mg/dL (ref 8.9–10.3)
Chloride: 100 mmol/L (ref 98–111)
Creatinine, Ser: 4.12 mg/dL — ABNORMAL HIGH (ref 0.61–1.24)
GFR, Estimated: 14 mL/min — ABNORMAL LOW (ref >60)
Glucose, Bld: 110 mg/dL — ABNORMAL HIGH (ref 70–99)
Potassium: 3.7 mmol/L (ref 3.5–5.1)
Sodium: 139 mmol/L (ref 135–145)

## 2022-04-22 LAB — HEMOGLOBIN AND HEMATOCRIT, BLOOD
HCT: 33.5 % — ABNORMAL LOW (ref 39.0–52.0)
Hemoglobin: 10.7 g/dL — ABNORMAL LOW (ref 13.0–17.0)

## 2022-04-22 SURGERY — ARTERIOVENOUS (AV) FISTULA CREATION
Anesthesia: General | Site: Arm Upper | Laterality: Left

## 2022-04-22 MED ORDER — LIDOCAINE HCL 1 % IJ SOLN
INTRAMUSCULAR | Status: DC | PRN
  Administered 2022-04-22: 50 mg via INTRADERMAL

## 2022-04-22 MED ORDER — METOPROLOL TARTRATE 5 MG/5ML IV SOLN
INTRAVENOUS | Status: AC
  Filled 2022-04-22: qty 5

## 2022-04-22 MED ORDER — MIDAZOLAM HCL 2 MG/2ML IJ SOLN
INTRAMUSCULAR | Status: AC
  Filled 2022-04-22: qty 2

## 2022-04-22 MED ORDER — LIDOCAINE-EPINEPHRINE 0.5 %-1:200000 IJ SOLN
INTRAMUSCULAR | Status: DC | PRN
  Administered 2022-04-22: 8 mL

## 2022-04-22 MED ORDER — PROPOFOL 10 MG/ML IV BOLUS
INTRAVENOUS | Status: DC | PRN
  Administered 2022-04-22: 30 mg via INTRAVENOUS

## 2022-04-22 MED ORDER — CHLORHEXIDINE GLUCONATE 0.12 % MT SOLN
OROMUCOSAL | Status: AC
  Filled 2022-04-22: qty 15

## 2022-04-22 MED ORDER — PROPOFOL 500 MG/50ML IV EMUL
INTRAVENOUS | Status: DC | PRN
  Administered 2022-04-22: 75 ug/kg/min via INTRAVENOUS

## 2022-04-22 MED ORDER — FENTANYL CITRATE (PF) 100 MCG/2ML IJ SOLN
INTRAMUSCULAR | Status: AC
  Filled 2022-04-22: qty 2

## 2022-04-22 MED ORDER — CEFAZOLIN SODIUM-DEXTROSE 2-4 GM/100ML-% IV SOLN
INTRAVENOUS | Status: AC
  Filled 2022-04-22: qty 100

## 2022-04-22 MED ORDER — ORAL CARE MOUTH RINSE: 15.0000 mL | Freq: Once | OROMUCOSAL | Status: DC

## 2022-04-22 MED ORDER — CHLORHEXIDINE GLUCONATE 4 % EX LIQD: 60.0000 mL | Freq: Once | CUTANEOUS | Status: DC

## 2022-04-22 MED ORDER — PROPOFOL 10 MG/ML IV BOLUS
INTRAVENOUS | Status: AC
  Filled 2022-04-22: qty 20

## 2022-04-22 MED ORDER — 0.9 % SODIUM CHLORIDE (POUR BTL) OPTIME
TOPICAL | Status: DC | PRN
  Administered 2022-04-22: 500 mL

## 2022-04-22 MED ORDER — CEFAZOLIN SODIUM-DEXTROSE 2-4 GM/100ML-% IV SOLN
2.0000 g | INTRAVENOUS | Status: AC
  Administered 2022-04-22: 2 g via INTRAVENOUS

## 2022-04-22 MED ORDER — CHLORHEXIDINE GLUCONATE 0.12 % MT SOLN: 15.0000 mL | Freq: Once | OROMUCOSAL | Status: DC

## 2022-04-22 MED ORDER — HEPARIN 6000 UNIT IRRIGATION SOLUTION
Status: DC | PRN
  Administered 2022-04-22: 1

## 2022-04-22 MED ORDER — SODIUM CHLORIDE 0.9 % IV SOLN: INTRAVENOUS | Status: DC

## 2022-04-22 MED ORDER — EPHEDRINE 5 MG/ML INJ
INTRAVENOUS | Status: AC
  Filled 2022-04-22: qty 5

## 2022-04-22 MED ORDER — HEPARIN SODIUM (PORCINE) 1000 UNIT/ML IJ SOLN
INTRAMUSCULAR | Status: AC
  Filled 2022-04-22: qty 6

## 2022-04-22 MED ORDER — LIDOCAINE-EPINEPHRINE 0.5 %-1:200000 IJ SOLN
INTRAMUSCULAR | Status: AC
  Filled 2022-04-22: qty 1

## 2022-04-22 MED ORDER — FENTANYL CITRATE (PF) 100 MCG/2ML IJ SOLN
INTRAMUSCULAR | Status: DC | PRN
  Administered 2022-04-22: 25 ug via INTRAVENOUS

## 2022-04-22 MED ORDER — LIDOCAINE HCL (PF) 2 % IJ SOLN
INTRAMUSCULAR | Status: AC
  Filled 2022-04-22: qty 5

## 2022-04-22 MED ORDER — LACTATED RINGERS IV SOLN: INTRAVENOUS | Status: DC

## 2022-04-22 SURGICAL SUPPLY — 35 items
ARMBAND PINK RESTRICT EXTREMIT (MISCELLANEOUS) ×1 IMPLANT
BAG HAMPER (MISCELLANEOUS) ×1 IMPLANT
CANNULA VESSEL 3MM 2 BLNT TIP (CANNULA) ×1 IMPLANT
CLIP LIGATING EXTRA MED SLVR (CLIP) ×1 IMPLANT
CLIP LIGATING EXTRA SM BLUE (MISCELLANEOUS) ×1 IMPLANT
COVER LIGHT HANDLE STERIS (MISCELLANEOUS) ×2 IMPLANT
COVER MAYO STAND XLG (MISCELLANEOUS) ×1 IMPLANT
DECANTER SPIKE VIAL GLASS SM (MISCELLANEOUS) ×1 IMPLANT
DERMABOND ADVANCED .7 DNX12 (GAUZE/BANDAGES/DRESSINGS) ×1 IMPLANT
ELECT REM PT RETURN 9FT ADLT (ELECTROSURGICAL) ×1
ELECTRODE REM PT RTRN 9FT ADLT (ELECTROSURGICAL) ×1 IMPLANT
GAUZE SPONGE 4X4 12PLY STRL (GAUZE/BANDAGES/DRESSINGS) ×1 IMPLANT
GLOVE BIOGEL PI IND STRL 7.0 (GLOVE) ×2 IMPLANT
GLOVE SURG MICRO LTX SZ7.5 (GLOVE) ×1 IMPLANT
GOWN STRL REUS W/TWL LRG LVL3 (GOWN DISPOSABLE) ×3 IMPLANT
IV NS 500ML (IV SOLUTION) ×1
IV NS 500ML BAXH (IV SOLUTION) ×1 IMPLANT
KIT BLADEGUARD II DBL (SET/KITS/TRAYS/PACK) ×1 IMPLANT
KIT TURNOVER KIT A (KITS) ×1 IMPLANT
MANIFOLD NEPTUNE II (INSTRUMENTS) ×1 IMPLANT
MARKER SKIN DUAL TIP RULER LAB (MISCELLANEOUS) ×2 IMPLANT
NDL HYPO 18GX1.5 BLUNT FILL (NEEDLE) ×1 IMPLANT
NEEDLE HYPO 18GX1.5 BLUNT FILL (NEEDLE) ×1 IMPLANT
NS IRRIG 1000ML POUR BTL (IV SOLUTION) ×1 IMPLANT
PACK CV ACCESS (CUSTOM PROCEDURE TRAY) ×1 IMPLANT
PAD ARMBOARD 7.5X6 YLW CONV (MISCELLANEOUS) ×1 IMPLANT
SET BASIN LINEN APH (SET/KITS/TRAYS/PACK) ×1 IMPLANT
SOL PREP POV-IOD 4OZ 10% (MISCELLANEOUS) ×1 IMPLANT
SOL PREP PROV IODINE SCRUB 4OZ (MISCELLANEOUS) ×1 IMPLANT
SPONGE T-LAP 18X18 ~~LOC~~+RFID (SPONGE) ×1 IMPLANT
SUT PROLENE 6 0 CC (SUTURE) ×1 IMPLANT
SUT VIC AB 3-0 SH 27 (SUTURE) ×1
SUT VIC AB 3-0 SH 27X BRD (SUTURE) ×1 IMPLANT
SYR 10ML LL (SYRINGE) ×1 IMPLANT
UNDERPAD 30X36 HEAVY ABSORB (UNDERPADS AND DIAPERS) ×1 IMPLANT

## 2022-04-22 NOTE — Anesthesia Preprocedure Evaluation (Signed)
Anesthesia Evaluation  Patient identified by MRN, date of birth, ID band Patient awake    Reviewed: Allergy & Precautions, H&P , NPO status , Patient's Chart, lab work & pertinent test results, reviewed documented beta blocker date and time   Airway Mallampati: II  TM Distance: >3 FB Neck ROM: full    Dental no notable dental hx.    Pulmonary neg pulmonary ROS, Current Smoker   Pulmonary exam normal breath sounds clear to auscultation       Cardiovascular Exercise Tolerance: Good hypertension, + CAD, + Past MI and + CABG   Rhythm:regular Rate:Normal     Neuro/Psych  PSYCHIATRIC DISORDERS Anxiety     negative neurological ROS     GI/Hepatic negative GI ROS, Neg liver ROS,,,  Endo/Other  negative endocrine ROS    Renal/GU ESRF and DialysisRenal disease  negative genitourinary   Musculoskeletal   Abdominal   Peds  Hematology negative hematology ROS (+)   Anesthesia Other Findings  1. Left ventricular ejection fraction, by estimation, is 65 to 70%. The  left ventricle has normal function. The left ventricle has no regional  wall motion abnormalities. There is moderate left ventricular hypertrophy.  Left ventricular diastolic  parameters are indeterminate. Elevated left atrial pressure.   2. Right ventricular systolic function is normal. The right ventricular  size is normal.   3. Left atrial size was mildly dilated.   4. Right atrial size was mildly dilated.   5. The mitral valve is abnormal. Mild mitral valve regurgitation. No  evidence of mitral stenosis.   6. A 32mm Edwards INSPIRIS RESILIA PERICARDIAL VALVE bioprosthesis valve      is present in the aortic position. The aortic valve has been  repaired/replaced. Aortic valve regurgitation is not visualized. No aortic  stenosis is present.   7. The inferior vena cava is normal in size with greater than 50%  respiratory variability, suggesting right atrial  pressure of 3 mmHg.     Reproductive/Obstetrics negative OB ROS                             Anesthesia Physical Anesthesia Plan  ASA: 3  Anesthesia Plan: General   Post-op Pain Management:    Induction:   PONV Risk Score and Plan:   Airway Management Planned:   Additional Equipment:   Intra-op Plan:   Post-operative Plan:   Informed Consent: I have reviewed the patients History and Physical, chart, labs and discussed the procedure including the risks, benefits and alternatives for the proposed anesthesia with the patient or authorized representative who has indicated his/her understanding and acceptance.     Dental Advisory Given  Plan Discussed with: CRNA  Anesthesia Plan Comments:        Anesthesia Quick Evaluation

## 2022-04-22 NOTE — Transfer of Care (Signed)
Immediate Anesthesia Transfer of Care Note  [name]  Procedure(s) Performed: LEFT ARTERIOVENOUS (AV) FISTULA  CREATION (Left: Arm Upper)  Patient Location: PACU  Anesthesia Type:General  Level of Consciousness: awake  Airway & Oxygen Therapy: Patient Spontanous Breathing  Post-op Assessment: Report given to RN  Post vital signs: Reviewed  Last Vitals:  Vitals Value Taken Time  BP 137/69 04/22/22 0900  Temp    Pulse 47 04/22/22 0902  Resp 12 04/22/22 0902  SpO2 100 % 04/22/22 0902  Vitals shown include unvalidated device data.  Last Pain:  Vitals:   04/22/22 0655  PainSc: 0-No pain         Complications: No notable events documented.

## 2022-04-22 NOTE — Discharge Instructions (Signed)
Vascular and Vein Specialists of [facility]  Discharge Instructions  AV Fistula or Graft Surgery for Dialysis Access  Please refer to the following instructions for your post-procedure care. Your surgeon or physician assistant will discuss any changes with you.  Activity  You may drive the day following your surgery, if you are comfortable and no longer taking prescription pain medication. Resume full activity as the soreness in your incision resolves.  Bathing/Showering  You may shower after you go home. Keep your incision dry for 48 hours. Do not soak in a bathtub, hot tub, or swim until the incision heals completely. You may not shower if you have a hemodialysis catheter.  Incision Care  Clean your incision with mild soap and water after 48 hours. Pat the area dry with a clean towel. You do not need a bandage unless otherwise instructed. Do not apply any ointments or creams to your incision. You may have skin glue on your incision. Do not peel it off. It will come off on its own in about one week. Your arm may swell a bit after surgery. To reduce swelling use pillows to elevate your arm so it is above your heart. Your doctor will tell you if you need to lightly wrap your arm with an ACE bandage.  Diet  Resume your normal diet. There are not special food restrictions following this procedure. In order to heal from your surgery, it is CRITICAL to get adequate nutrition. Your body requires vitamins, minerals, and protein. Vegetables are the best source of vitamins and minerals. Vegetables also provide the perfect balance of protein. Processed food has little nutritional value, so try to avoid this.  Medications  Resume taking all of your medications. If your incision is causing pain, you may take over-the counter pain relievers such as acetaminophen (Tylenol). If you were prescribed a stronger pain medication, please be aware these medications can cause nausea and constipation. Prevent  nausea by taking the medication with a snack or meal. Avoid constipation by drinking plenty of fluids and eating foods with high amount of fiber, such as fruits, vegetables, and grains.  Do not take Tylenol if you are taking prescription pain medications.  Follow up Your surgeon may want to see you in the office following your access surgery. If so, this will be arranged at the time of your surgery.  Please call us immediately for any of the following conditions:  Increased pain, redness, drainage (pus) from your incision site Fever of 101 degrees or higher Severe or worsening pain at your incision site Hand pain or numbness.  Reduce your risk of vascular disease:  Stop smoking. If you would like help, call [facility] at 1-800-QUIT-NOW 502-852-8928) or [place] at [place] your cholesterol Maintain a desired weight Control your diabetes Keep your blood pressure down  Dialysis  It will take several weeks to several months for your new dialysis access to be ready for use. Your surgeon will determine when it is okay to use it. Your nephrologist will continue to direct your dialysis. You can continue to use your Permcath until your new access is ready for use.   04/22/2022 Matthew [name] [id number] 10/28/1945  Surgeon(s): Arelie Kuzel, Arvilla Meres, MD  Procedure(s): LEFT ARTERIOVENOUS (AV) FISTULA  CREATION   May stick graft immediately   May stick graft on designated area only:    Do not stick fistula for 12 weeks    If you have any questions, please call the office at [phone].

## 2022-04-22 NOTE — Interval H&P Note (Signed)
History and Physical Interval Note:  04/22/2022 7:22 AM  Matthew [name]  has presented today for surgery, with the diagnosis of ESRD.  The various methods of treatment have been discussed with the patient and family. After consideration of risks, benefits and other options for treatment, the patient has consented to  Procedure(s): LEFT ARTERIOVENOUS (AV) FISTULA VERSUS ARTERIOVENOUS GRAFT CREATION (Left) as a surgical intervention.  The patient's history has been reviewed, patient examined, no change in status, stable for surgery.  I have reviewed the patient's chart and labs.  Questions were answered to the patient's satisfaction.     Curt Jews

## 2022-04-22 NOTE — Op Note (Signed)
    OPERATIVE REPORT  DATE OF SURGERY: 04/22/2022  [name], 76 y.o. male MRN: [id number]  DOB: 04-01-1946  PRE-OPERATIVE DIAGNOSIS: End-stage renal disease  POST-OPERATIVE DIAGNOSIS:  Same  PROCEDURE: Left for stage brachiobasilic fistula creation  SURGEON:  Curt Jews, M.D.  PHYSICIAN ASSISTANT: Fulton Mole, RNFA  The assistant was needed for exposure and to expedite the case  ANESTHESIA: Local with sedation  EBL: per anesthesia record  Total I/O In: 250 [I.V.:250] Out: -   BLOOD ADMINISTERED: none  DRAINS: none  SPECIMEN: none  COUNTS CORRECT:  YES  PATIENT DISPOSITION:  PACU - hemodynamically stable  PROCEDURE DETAILS: Patient was taken operating placed supine position with area of the left arm prepped draped you sterile fashion.  SonoSite ultrasound was used to visualize the left arm veins.  Patient did have a good size left basilic vein and a very small cephalic vein.  Using local anesthesia, incision was made transversely at the antecubital space and isolated the basilic vein which was of good caliber.  Tributary branches were ligated and divided.  The brachial artery was exposed through the same incision.  The artery was of normal caliber but did have significant atherosclerotic changes.  The vein was ligated distally and divided and was mobilized to the brachial artery.  The artery was occluded proximally and distally and opened with an 11 blade and sent illustrating the Pott scissors.  A small arteriotomy was created to reduce risk of steal.  Vein was cut to the appropriate length and spatulated and sewn end-to-side to the artery with a running 6-0 Prolene suture.  The clamps were removed and excellent thrill was noted through the fistula.  The patient maintained a left radial pulse.  The wound was irrigated with saline and hemostasis was obtained with electrocautery.  The wound was closed with 3-0 Vicryl in the subcutaneous and subcuticular tissue.   Sterile dressing was applied and the patient was transferred to the recovery room in stable condition   [name], M.D., [facility] 04/22/2022 8:47 AM  Note: Portions of this report may have been transcribed using voice recognition software.  Every effort has been made to ensure accuracy; however, inadvertent computerized transcription errors may still be present.

## 2022-04-23 DIAGNOSIS — Z7982 Long term (current) use of aspirin: Secondary | ICD-10-CM | POA: Diagnosis not present

## 2022-04-23 DIAGNOSIS — Z9181 History of falling: Secondary | ICD-10-CM | POA: Diagnosis not present

## 2022-04-23 DIAGNOSIS — E785 Hyperlipidemia, unspecified: Secondary | ICD-10-CM | POA: Diagnosis not present

## 2022-04-23 DIAGNOSIS — Z992 Dependence on renal dialysis: Secondary | ICD-10-CM | POA: Diagnosis not present

## 2022-04-23 DIAGNOSIS — G5793 Unspecified mononeuropathy of bilateral lower limbs: Secondary | ICD-10-CM | POA: Diagnosis not present

## 2022-04-23 DIAGNOSIS — I130 Hypertensive heart and chronic kidney disease with heart failure and stage 1 through stage 4 chronic kidney disease, or unspecified chronic kidney disease: Secondary | ICD-10-CM | POA: Diagnosis not present

## 2022-04-23 DIAGNOSIS — M1990 Unspecified osteoarthritis, unspecified site: Secondary | ICD-10-CM | POA: Diagnosis not present

## 2022-04-23 DIAGNOSIS — I5032 Chronic diastolic (congestive) heart failure: Secondary | ICD-10-CM | POA: Diagnosis not present

## 2022-04-23 DIAGNOSIS — M109 Gout, unspecified: Secondary | ICD-10-CM | POA: Diagnosis not present

## 2022-04-23 DIAGNOSIS — G4730 Sleep apnea, unspecified: Secondary | ICD-10-CM | POA: Diagnosis not present

## 2022-04-23 DIAGNOSIS — R7303 Prediabetes: Secondary | ICD-10-CM | POA: Diagnosis not present

## 2022-04-23 DIAGNOSIS — I739 Peripheral vascular disease, unspecified: Secondary | ICD-10-CM | POA: Diagnosis not present

## 2022-04-23 DIAGNOSIS — Z952 Presence of prosthetic heart valve: Secondary | ICD-10-CM | POA: Diagnosis not present

## 2022-04-23 DIAGNOSIS — N179 Acute kidney failure, unspecified: Secondary | ICD-10-CM | POA: Diagnosis not present

## 2022-04-23 DIAGNOSIS — I2510 Atherosclerotic heart disease of native coronary artery without angina pectoris: Secondary | ICD-10-CM | POA: Diagnosis not present

## 2022-04-23 DIAGNOSIS — D631 Anemia in chronic kidney disease: Secondary | ICD-10-CM | POA: Diagnosis not present

## 2022-04-23 DIAGNOSIS — F17210 Nicotine dependence, cigarettes, uncomplicated: Secondary | ICD-10-CM | POA: Diagnosis not present

## 2022-04-23 DIAGNOSIS — K219 Gastro-esophageal reflux disease without esophagitis: Secondary | ICD-10-CM | POA: Diagnosis not present

## 2022-04-23 DIAGNOSIS — Z951 Presence of aortocoronary bypass graft: Secondary | ICD-10-CM | POA: Diagnosis not present

## 2022-04-23 DIAGNOSIS — N186 End stage renal disease: Secondary | ICD-10-CM | POA: Diagnosis not present

## 2022-04-24 NOTE — Anesthesia Postprocedure Evaluation (Signed)
Anesthesia Post Note  [name]  Procedure(s) Performed: LEFT ARTERIOVENOUS (AV) FISTULA  CREATION (Left: Arm Upper)  Patient location during evaluation: Phase II Anesthesia Type: General Level of consciousness: awake Pain management: pain level controlled Vital Signs Assessment: post-procedure vital signs reviewed and stable Respiratory status: spontaneous breathing and respiratory function stable Cardiovascular status: blood pressure returned to baseline and stable Postop Assessment: no headache and no apparent nausea or vomiting Anesthetic complications: no Comments: Late entry   No notable events documented.   Last Vitals:  Vitals:   04/22/22 0915 04/22/22 0930  BP: 136/71 (!) 155/74  Pulse: 63 66  Resp: 15 14  Temp:    SpO2: 99% 97%    Last Pain:  Vitals:   04/22/22 0930  PainSc: 0-No pain                 Louann Sjogren

## 2022-04-30 ENCOUNTER — Encounter (HOSPITAL_COMMUNITY): Payer: Self-pay | Admitting: Vascular Surgery

## 2022-05-21 ENCOUNTER — Ambulatory Visit (INDEPENDENT_AMBULATORY_CARE_PROVIDER_SITE_OTHER): Payer: Medicare Other | Admitting: Vascular Surgery

## 2022-05-21 ENCOUNTER — Encounter: Payer: Self-pay | Admitting: Vascular Surgery

## 2022-05-21 VITALS — BP 138/65 | HR 66 | Temp 97.0°F | Ht 68.0 in | Wt 186.2 lb

## 2022-05-21 DIAGNOSIS — Z992 Dependence on renal dialysis: Secondary | ICD-10-CM

## 2022-05-21 DIAGNOSIS — N186 End stage renal disease: Secondary | ICD-10-CM

## 2022-05-21 NOTE — Progress Notes (Signed)
[facility] [place]  Patient name: Matthew [name] MRN: [id number] DOB: 30-Jul-1945 Sex: male  REASON FOR VISIT: Follow-up for stage left basilic brachial fistula  HPI: Matthew [name] is a 77 y.o. male here today for follow-up.  Had for stage brachiobasilic fistula creation on 04/22/2022.  He reports minimal discomfort associated with incision incision and no steal symptoms.  Current Outpatient Medications  Medication Sig Dispense Refill   acetaminophen (TYLENOL) 325 MG tablet Take 2 tablets (650 mg total) by mouth every 6 (six) hours as needed for mild pain. (Patient taking differently: Take 650 mg by mouth every 6 (six) hours as needed for mild pain or fever.)     alum & mag hydroxide-simeth (MAALOX/MYLANTA) 200-200-20 MG/5ML suspension Take 30 mLs by mouth every 6 (six) hours as needed for indigestion or heartburn. If symptoms persist for longer than 24 hours please contact the physician/provider     ascorbic acid (VITAMIN C) 500 MG tablet Take 500 mg by mouth in the morning.     aspirin 81 MG chewable tablet Chew 81 mg by mouth in the morning.     atorvastatin (LIPITOR) 80 MG tablet Take 1 tablet (80 mg total) by mouth daily at 6 PM. (Patient taking differently: Take 80 mg by mouth at bedtime.) 30 tablet 1   famotidine (PEPCID) 10 MG tablet Take 20 mg by mouth in the morning.     febuxostat (ULORIC) 40 MG tablet Take 40 mg by mouth daily.     ferrous sulfate 325 (65 FE) MG tablet Take 325 mg by mouth every Monday, Wednesday, and Friday.      furosemide (LASIX) 40 MG tablet Take 1 tablet (40 mg total) by mouth 2 (two) times daily. 30 tablet    guaiFENesin (ROBITUSSIN) 100 MG/5ML liquid Take 10 mLs by mouth every 6 (six) hours as needed for cough or to loosen phlegm. If symptoms persist for longer than 24 hours please contact the physician/provider     loperamide (IMODIUM) 2 MG capsule Take 2 mg by mouth as needed for diarrhea or loose  stools.     magnesium hydroxide (MILK OF MAGNESIA) 400 MG/5ML suspension Take 30 mLs by mouth at bedtime as needed for mild constipation. If symptoms persist for longer than 24 hours please contact the physician/provider.     methocarbamol (ROBAXIN) 750 MG tablet Take 750 mg by mouth daily as needed for muscle spasms.     neomycin-bacitracin-polymyxin [phone] OINT Apply 1 Application topically as needed (minor skin tears or abrasions).     NIFEdipine (ADALAT CC) 60 MG 24 hr tablet Take 60 mg by mouth daily.     oxyCODONE (OXY IR/ROXICODONE) 5 MG immediate release tablet Take 1 tablet (5 mg total) by mouth every 6 (six) hours as needed for severe pain. 12 tablet 0   oxyCODONE-acetaminophen (PERCOCET/ROXICET) 5-325 MG tablet Take 1 tablet by mouth every 6 (six) hours as needed for moderate pain.     Zinc 50 MG TABS Take 50 mg by mouth in the morning.     carvedilol (COREG) 25 MG tablet Take 1 tablet (25 mg total) by mouth 2 (two) times daily. (Patient taking differently: Take 25 mg by mouth every Monday, Wednesday, and Friday.) 180 tablet 1   No current facility-administered medications for this visit.     PHYSICAL EXAM: Vitals:   05/21/22 1524  BP: 138/65  Pulse: 66  Temp: (!) 97 F (36.1 C)  SpO2: 99%  Weight: 186 lb 3.2  oz (84.5 kg)  Height: 5' 8"$  (1.727 m)    GENERAL: The patient is a well-nourished male, in no acute distress. The vital signs are documented above. Excellent thrill in his left upper arm fistula.  I imaged his vein with SonoSite and he has good maturation throughout its course in the upper arm.  MEDICAL ISSUES: Discussed the plan for left second stage transposition with the patient.  He has hemodialysis on Monday Wednesday and Friday.  We will plan for this on Tuesday, January 3o at [place], [facility] Tel [phone]  Note: Portions of this report may have been transcribed  using voice recognition software.  Every effort has been made to ensure accuracy; however, inadvertent computerized transcription errors may still be present.

## 2022-05-21 NOTE — H&P (View-Only) (Signed)
[facility] [place]  Patient name: Matthew [name] MRN: [id number] DOB: 30-Jul-1945 Sex: male  REASON FOR VISIT: Follow-up for stage left basilic brachial fistula  HPI: Matthew [name] is a 76 y.o. male here today for follow-up.  Had for stage brachiobasilic fistula creation on 04/22/2022.  He reports minimal discomfort associated with incision incision and no steal symptoms.  Current Outpatient Medications  Medication Sig Dispense Refill   acetaminophen (TYLENOL) 325 MG tablet Take 2 tablets (650 mg total) by mouth every 6 (six) hours as needed for mild pain. (Patient taking differently: Take 650 mg by mouth every 6 (six) hours as needed for mild pain or fever.)     alum & mag hydroxide-simeth (MAALOX/MYLANTA) 200-200-20 MG/5ML suspension Take 30 mLs by mouth every 6 (six) hours as needed for indigestion or heartburn. If symptoms persist for longer than 24 hours please contact the physician/provider     ascorbic acid (VITAMIN C) 500 MG tablet Take 500 mg by mouth in the morning.     aspirin 81 MG chewable tablet Chew 81 mg by mouth in the morning.     atorvastatin (LIPITOR) 80 MG tablet Take 1 tablet (80 mg total) by mouth daily at 6 PM. (Patient taking differently: Take 80 mg by mouth at bedtime.) 30 tablet 1   famotidine (PEPCID) 10 MG tablet Take 20 mg by mouth in the morning.     febuxostat (ULORIC) 40 MG tablet Take 40 mg by mouth daily.     ferrous sulfate 325 (65 FE) MG tablet Take 325 mg by mouth every Monday, Wednesday, and Friday.      furosemide (LASIX) 40 MG tablet Take 1 tablet (40 mg total) by mouth 2 (two) times daily. 30 tablet    guaiFENesin (ROBITUSSIN) 100 MG/5ML liquid Take 10 mLs by mouth every 6 (six) hours as needed for cough or to loosen phlegm. If symptoms persist for longer than 24 hours please contact the physician/provider     loperamide (IMODIUM) 2 MG capsule Take 2 mg by mouth as needed for diarrhea or loose  stools.     magnesium hydroxide (MILK OF MAGNESIA) 400 MG/5ML suspension Take 30 mLs by mouth at bedtime as needed for mild constipation. If symptoms persist for longer than 24 hours please contact the physician/provider.     methocarbamol (ROBAXIN) 750 MG tablet Take 750 mg by mouth daily as needed for muscle spasms.     neomycin-bacitracin-polymyxin [phone] OINT Apply 1 Application topically as needed (minor skin tears or abrasions).     NIFEdipine (ADALAT CC) 60 MG 24 hr tablet Take 60 mg by mouth daily.     oxyCODONE (OXY IR/ROXICODONE) 5 MG immediate release tablet Take 1 tablet (5 mg total) by mouth every 6 (six) hours as needed for severe pain. 12 tablet 0   oxyCODONE-acetaminophen (PERCOCET/ROXICET) 5-325 MG tablet Take 1 tablet by mouth every 6 (six) hours as needed for moderate pain.     Zinc 50 MG TABS Take 50 mg by mouth in the morning.     carvedilol (COREG) 25 MG tablet Take 1 tablet (25 mg total) by mouth 2 (two) times daily. (Patient taking differently: Take 25 mg by mouth every Monday, Wednesday, and Friday.) 180 tablet 1   No current facility-administered medications for this visit.     PHYSICAL EXAM: Vitals:   05/21/22 1524  BP: 138/65  Pulse: 66  Temp: (!) 97 F (36.1 C)  SpO2: 99%  Weight: 186 lb 3.2  oz (84.5 kg)  Height: 5' 8"$  (1.727 m)    GENERAL: The patient is a well-nourished male, in no acute distress. The vital signs are documented above. Excellent thrill in his left upper arm fistula.  I imaged his vein with SonoSite and he has good maturation throughout its course in the upper arm.  MEDICAL ISSUES: Discussed the plan for left second stage transposition with the patient.  He has hemodialysis on Monday Wednesday and Friday.  We will plan for this on Tuesday, January 3o at [place] Tel [phone]  Note: Portions of this report may have been transcribed  using voice recognition software.  Every effort has been made to ensure accuracy; however, inadvertent computerized transcription errors may still be present.

## 2022-05-23 ENCOUNTER — Other Ambulatory Visit: Payer: Self-pay

## 2022-05-23 ENCOUNTER — Telehealth: Payer: Self-pay

## 2022-05-23 DIAGNOSIS — N186 End stage renal disease: Secondary | ICD-10-CM

## 2022-05-23 NOTE — Telephone Encounter (Signed)
Attempted to reach pt to schedule surgery on both numbers listed. Left VM requesting for a return call back.

## 2022-05-23 NOTE — Telephone Encounter (Signed)
Spoke with Therisa Doyne at [facility] regarding scheduling patient for left arm second stage basilic vein transposition with Dr. Donnetta Hutching on 06/03/22 at [facility]. Instructions given- she verbalized understanding. Will also fax a copy of instructions letter to center at [phone].

## 2022-05-30 ENCOUNTER — Encounter (HOSPITAL_COMMUNITY)
Admission: RE | Admit: 2022-05-30 | Discharge: 2022-05-30 | Disposition: A | Discharge status: Home / Self Care | Payer: Medicare Other | Source: Ambulatory Visit | Attending: Vascular Surgery | Admitting: Vascular Surgery

## 2022-05-30 DIAGNOSIS — N186 End stage renal disease: Secondary | ICD-10-CM

## 2022-06-02 ENCOUNTER — Telehealth: Payer: Self-pay

## 2022-06-02 NOTE — Telephone Encounter (Signed)
Spoke with patient and informed of the need to reschedule surgery at next available date on 06/17/22 due to him taking Eliquis, which our office was not aware he was on medication. Instructions provided and advised he will need to hold Eliquis 3 days prior, last dose on 06/13/22. Patient verbalized understanding. Will also fax instructions letter to Theress[facility] Stamps for patient.

## 2022-06-02 NOTE — Pre-Procedure Instructions (Signed)
  eliquis Received: Today Encarnacion Chu, RN  Nicholas Lose, RN Luberta Robertson. I just got off the phone with Gertie Fey. He did not stop his eliquis on 1/27. He also had his eliquis already this morning. Will you let Dr Donnetta Hutching know so we can see if he wants to proceed tomorrow or reschedule? Thank you!

## 2022-06-02 NOTE — Pre-Procedure Instructions (Signed)
Matthew [name] at Dr Luther Parody office messaged because patient took his eliquis this morning. He was supposed to stop it for procedure.

## 2022-06-02 NOTE — Telephone Encounter (Signed)
Per Dr. Donnetta Hutching, patient needs to be rescheduled. Attempted to reach patient at both numbers listed. Left message for patient to return call.

## 2022-06-02 NOTE — Telephone Encounter (Signed)
-----  Message from Encarnacion Chu, RN sent at 06/02/2022  9:12 AM EST ----- Regarding: eliquis Hey [place]. I just got off the phone with Gertie Fey. He did not stop his eliquis on 1/27. He also had his eliquis already this morning. Will you let Dr Donnetta Hutching know so we can see if he wants to proceed tomorrow or reschedule? Thank you!

## 2022-06-13 ENCOUNTER — Encounter (HOSPITAL_COMMUNITY)
Admission: RE | Admit: 2022-06-13 | Discharge: 2022-06-13 | Disposition: A | Discharge status: Home / Self Care | Payer: Medicare Other | Source: Ambulatory Visit | Attending: Vascular Surgery | Admitting: Vascular Surgery

## 2022-06-17 ENCOUNTER — Ambulatory Visit (HOSPITAL_BASED_OUTPATIENT_CLINIC_OR_DEPARTMENT_OTHER): Payer: No Typology Code available for payment source | Admitting: Certified Registered"

## 2022-06-17 ENCOUNTER — Encounter (HOSPITAL_COMMUNITY)
Admission: RE | Disposition: A | Discharge status: Home / Self Care | Payer: Self-pay | Source: Home / Self Care | Attending: Vascular Surgery

## 2022-06-17 ENCOUNTER — Ambulatory Visit (HOSPITAL_COMMUNITY)
Admission: RE | Admit: 2022-06-17 | Discharge: 2022-06-17 | Disposition: A | Discharge status: Home / Self Care | Payer: No Typology Code available for payment source | Attending: Vascular Surgery | Admitting: Vascular Surgery

## 2022-06-17 ENCOUNTER — Encounter (HOSPITAL_COMMUNITY): Payer: Self-pay | Admitting: Vascular Surgery

## 2022-06-17 ENCOUNTER — Other Ambulatory Visit: Payer: Self-pay

## 2022-06-17 ENCOUNTER — Ambulatory Visit (HOSPITAL_COMMUNITY): Payer: No Typology Code available for payment source | Admitting: Certified Registered"

## 2022-06-17 DIAGNOSIS — I2510 Atherosclerotic heart disease of native coronary artery without angina pectoris: Secondary | ICD-10-CM

## 2022-06-17 DIAGNOSIS — N186 End stage renal disease: Secondary | ICD-10-CM | POA: Diagnosis not present

## 2022-06-17 DIAGNOSIS — I5031 Acute diastolic (congestive) heart failure: Secondary | ICD-10-CM | POA: Diagnosis not present

## 2022-06-17 DIAGNOSIS — Z992 Dependence on renal dialysis: Secondary | ICD-10-CM

## 2022-06-17 DIAGNOSIS — G4730 Sleep apnea, unspecified: Secondary | ICD-10-CM | POA: Diagnosis not present

## 2022-06-17 DIAGNOSIS — I132 Hypertensive heart and chronic kidney disease with heart failure and with stage 5 chronic kidney disease, or end stage renal disease: Secondary | ICD-10-CM | POA: Insufficient documentation

## 2022-06-17 DIAGNOSIS — I739 Peripheral vascular disease, unspecified: Secondary | ICD-10-CM | POA: Diagnosis not present

## 2022-06-17 DIAGNOSIS — N185 Chronic kidney disease, stage 5: Secondary | ICD-10-CM | POA: Diagnosis not present

## 2022-06-17 DIAGNOSIS — I509 Heart failure, unspecified: Secondary | ICD-10-CM | POA: Diagnosis not present

## 2022-06-17 DIAGNOSIS — F17200 Nicotine dependence, unspecified, uncomplicated: Secondary | ICD-10-CM | POA: Diagnosis not present

## 2022-06-17 DIAGNOSIS — I252 Old myocardial infarction: Secondary | ICD-10-CM | POA: Diagnosis not present

## 2022-06-17 DIAGNOSIS — F17210 Nicotine dependence, cigarettes, uncomplicated: Secondary | ICD-10-CM

## 2022-06-17 HISTORY — PX: BASCILIC VEIN TRANSPOSITION: SHX5742

## 2022-06-17 LAB — BASIC METABOLIC PANEL
Anion gap: 12 (ref 5–15)
BUN: 19 mg/dL (ref 8–23)
CO2: 25 mmol/L (ref 22–32)
Calcium: 8.6 mg/dL — ABNORMAL LOW (ref 8.9–10.3)
Chloride: 100 mmol/L (ref 98–111)
Creatinine, Ser: 3.94 mg/dL — ABNORMAL HIGH (ref 0.61–1.24)
GFR, Estimated: 15 mL/min — ABNORMAL LOW (ref >60)
Glucose, Bld: 103 mg/dL — ABNORMAL HIGH (ref 70–99)
Potassium: 4 mmol/L (ref 3.5–5.1)
Sodium: 137 mmol/L (ref 135–145)

## 2022-06-17 LAB — HEMOGLOBIN AND HEMATOCRIT, BLOOD
HCT: 29.6 % — ABNORMAL LOW (ref 39.0–52.0)
Hemoglobin: 9.7 g/dL — ABNORMAL LOW (ref 13.0–17.0)

## 2022-06-17 SURGERY — TRANSPOSITION, VEIN, BASILIC
Anesthesia: General | Site: Arm Upper | Laterality: Left

## 2022-06-17 MED ORDER — PHENYLEPHRINE 80 MCG/ML (10ML) SYRINGE FOR IV PUSH (FOR BLOOD PRESSURE SUPPORT)
PREFILLED_SYRINGE | INTRAVENOUS | Status: DC | PRN
  Administered 2022-06-17 (×2): 160 ug via INTRAVENOUS

## 2022-06-17 MED ORDER — OXYCODONE HCL 5 MG PO TABS: 5.0000 mg | ORAL_TABLET | Freq: Once | ORAL | Status: DC | PRN

## 2022-06-17 MED ORDER — DEXMEDETOMIDINE HCL IN NACL 80 MCG/20ML IV SOLN
INTRAVENOUS | Status: AC
  Filled 2022-06-17: qty 20

## 2022-06-17 MED ORDER — SODIUM CHLORIDE 0.9 % IV SOLN: INTRAVENOUS | Status: DC

## 2022-06-17 MED ORDER — FENTANYL CITRATE PF 50 MCG/ML IJ SOSY: 25.0000 ug | PREFILLED_SYRINGE | INTRAMUSCULAR | Status: DC | PRN

## 2022-06-17 MED ORDER — HEPARIN SODIUM (PORCINE) 1000 UNIT/ML IJ SOLN
INTRAMUSCULAR | Status: AC
  Filled 2022-06-17: qty 6

## 2022-06-17 MED ORDER — HEPARIN 6000 UNIT IRRIGATION SOLUTION
Status: DC | PRN
  Administered 2022-06-17: 1

## 2022-06-17 MED ORDER — LIDOCAINE HCL (PF) 2 % IJ SOLN
INTRAMUSCULAR | Status: AC
  Filled 2022-06-17: qty 5

## 2022-06-17 MED ORDER — CHLORHEXIDINE GLUCONATE 4 % EX LIQD: 60.0000 mL | Freq: Once | CUTANEOUS | Status: DC

## 2022-06-17 MED ORDER — DEXAMETHASONE SODIUM PHOSPHATE 4 MG/ML IJ SOLN
INTRAMUSCULAR | Status: DC | PRN
  Administered 2022-06-17: 5 mg via INTRAVENOUS

## 2022-06-17 MED ORDER — OXYCODONE HCL 5 MG/5ML PO SOLN: 5.0000 mg | Freq: Once | ORAL | Status: DC | PRN

## 2022-06-17 MED ORDER — PHENYLEPHRINE 80 MCG/ML (10ML) SYRINGE FOR IV PUSH (FOR BLOOD PRESSURE SUPPORT)
PREFILLED_SYRINGE | INTRAVENOUS | Status: AC
  Filled 2022-06-17: qty 10

## 2022-06-17 MED ORDER — PHENYLEPHRINE HCL-NACL 20-0.9 MG/250ML-% IV SOLN
INTRAVENOUS | Status: AC
  Filled 2022-06-17: qty 500

## 2022-06-17 MED ORDER — LACTATED RINGERS IV SOLN: INTRAVENOUS | Status: DC

## 2022-06-17 MED ORDER — ONDANSETRON HCL 4 MG/2ML IJ SOLN
INTRAMUSCULAR | Status: DC | PRN
  Administered 2022-06-17: 4 mg via INTRAVENOUS

## 2022-06-17 MED ORDER — LIDOCAINE 2% (20 MG/ML) 5 ML SYRINGE
INTRAMUSCULAR | Status: DC | PRN
  Administered 2022-06-17: 100 mg via INTRAVENOUS

## 2022-06-17 MED ORDER — 0.9 % SODIUM CHLORIDE (POUR BTL) OPTIME
TOPICAL | Status: DC | PRN
  Administered 2022-06-17: 1000 mL

## 2022-06-17 MED ORDER — CHLORHEXIDINE GLUCONATE 0.12 % MT SOLN: 15.0000 mL | Freq: Once | OROMUCOSAL | Status: DC

## 2022-06-17 MED ORDER — DEXMEDETOMIDINE HCL IN NACL 80 MCG/20ML IV SOLN
INTRAVENOUS | Status: DC | PRN
  Administered 2022-06-17 (×3): 8 ug via BUCCAL

## 2022-06-17 MED ORDER — LIDOCAINE-EPINEPHRINE 0.5 %-1:200000 IJ SOLN
INTRAMUSCULAR | Status: AC
  Filled 2022-06-17: qty 1

## 2022-06-17 MED ORDER — ONDANSETRON HCL 4 MG/2ML IJ SOLN
INTRAMUSCULAR | Status: AC
  Filled 2022-06-17: qty 2

## 2022-06-17 MED ORDER — FENTANYL CITRATE (PF) 100 MCG/2ML IJ SOLN
INTRAMUSCULAR | Status: DC | PRN
  Administered 2022-06-17 (×4): 25 ug via INTRAVENOUS

## 2022-06-17 MED ORDER — ORAL CARE MOUTH RINSE: 15.0000 mL | Freq: Once | OROMUCOSAL | Status: DC

## 2022-06-17 MED ORDER — PHENYLEPHRINE HCL-NACL 20-0.9 MG/250ML-% IV SOLN
INTRAVENOUS | Status: DC | PRN
  Administered 2022-06-17: 30 ug/min via INTRAVENOUS

## 2022-06-17 MED ORDER — CEFAZOLIN SODIUM-DEXTROSE 2-4 GM/100ML-% IV SOLN
2.0000 g | INTRAVENOUS | Status: AC
  Administered 2022-06-17: 2 g via INTRAVENOUS
  Filled 2022-06-17: qty 100

## 2022-06-17 MED ORDER — PROPOFOL 10 MG/ML IV BOLUS
INTRAVENOUS | Status: DC | PRN
  Administered 2022-06-17: 120 mg via INTRAVENOUS

## 2022-06-17 MED ORDER — GLYCOPYRROLATE PF 0.2 MG/ML IJ SOSY
PREFILLED_SYRINGE | INTRAMUSCULAR | Status: AC
  Filled 2022-06-17: qty 1

## 2022-06-17 MED ORDER — GLYCOPYRROLATE PF 0.2 MG/ML IJ SOSY
PREFILLED_SYRINGE | INTRAMUSCULAR | Status: DC | PRN
  Administered 2022-06-17 (×2): .1 mg via INTRAVENOUS

## 2022-06-17 MED ORDER — FENTANYL CITRATE (PF) 100 MCG/2ML IJ SOLN
INTRAMUSCULAR | Status: AC
  Filled 2022-06-17: qty 2

## 2022-06-17 MED ORDER — OXYCODONE-ACETAMINOPHEN 5-325 MG PO TABS: 1.0000 | ORAL_TABLET | Freq: Four times a day (QID) | ORAL | 0 refills | Status: AC | PRN

## 2022-06-17 MED ORDER — PROPOFOL 500 MG/50ML IV EMUL
INTRAVENOUS | Status: AC
  Filled 2022-06-17: qty 50

## 2022-06-17 MED ORDER — ONDANSETRON HCL 4 MG/2ML IJ SOLN: 4.0000 mg | Freq: Once | INTRAMUSCULAR | Status: DC | PRN

## 2022-06-17 MED ORDER — DEXAMETHASONE SODIUM PHOSPHATE 10 MG/ML IJ SOLN
INTRAMUSCULAR | Status: AC
  Filled 2022-06-17: qty 1

## 2022-06-17 MED ORDER — MIDAZOLAM HCL 2 MG/2ML IJ SOLN
INTRAMUSCULAR | Status: AC
  Filled 2022-06-17: qty 2

## 2022-06-17 SURGICAL SUPPLY — 42 items
ADH SKN CLS APL DERMABOND .7 (GAUZE/BANDAGES/DRESSINGS) ×2
ARMBAND PINK RESTRICT EXTREMIT (MISCELLANEOUS) ×1 IMPLANT
BAG HAMPER (MISCELLANEOUS) ×1 IMPLANT
CANNULA VESSEL 3MM 2 BLNT TIP (CANNULA) ×1 IMPLANT
CLIP LIGATING EXTRA MED SLVR (CLIP) ×1 IMPLANT
CLIP LIGATING EXTRA SM BLUE (MISCELLANEOUS) ×1 IMPLANT
COVER LIGHT HANDLE STERIS (MISCELLANEOUS) ×1 IMPLANT
COVER MAYO STAND XLG (MISCELLANEOUS) ×1 IMPLANT
DERMABOND ADVANCED .7 DNX12 (GAUZE/BANDAGES/DRESSINGS) ×1 IMPLANT
ELECT REM PT RETURN 9FT ADLT (ELECTROSURGICAL) ×1
ELECTRODE REM PT RTRN 9FT ADLT (ELECTROSURGICAL) ×1 IMPLANT
GAUZE SPONGE 4X4 12PLY STRL (GAUZE/BANDAGES/DRESSINGS) ×1 IMPLANT
GLOVE BIOGEL PI IND STRL 7.0 (GLOVE) ×2 IMPLANT
GLOVE SURG MICRO LTX SZ7.5 (GLOVE) ×1 IMPLANT
GOWN STRL REUS W/TWL LRG LVL3 (GOWN DISPOSABLE) ×3 IMPLANT
IV NS 500ML (IV SOLUTION) ×1
IV NS 500ML BAXH (IV SOLUTION) ×1 IMPLANT
KIT BLADEGUARD II DBL (SET/KITS/TRAYS/PACK) ×1 IMPLANT
KIT TURNOVER KIT A (KITS) ×1 IMPLANT
MANIFOLD NEPTUNE II (INSTRUMENTS) ×1 IMPLANT
MARKER SKIN DUAL TIP RULER LAB (MISCELLANEOUS) ×2 IMPLANT
NDL HYPO 18GX1.5 BLUNT FILL (NEEDLE) ×2 IMPLANT
NEEDLE HYPO 18GX1.5 BLUNT FILL (NEEDLE) ×2 IMPLANT
NS IRRIG 1000ML POUR BTL (IV SOLUTION) ×1 IMPLANT
PACK CV ACCESS (CUSTOM PROCEDURE TRAY) ×1 IMPLANT
PAD ARMBOARD 7.5X6 YLW CONV (MISCELLANEOUS) ×1 IMPLANT
SET BASIN LINEN APH (SET/KITS/TRAYS/PACK) ×1 IMPLANT
SOL PREP POV-IOD 4OZ 10% (MISCELLANEOUS) ×1 IMPLANT
SOL PREP PROV IODINE SCRUB 4OZ (MISCELLANEOUS) ×1 IMPLANT
SPONGE T-LAP 18X18 ~~LOC~~+RFID (SPONGE) ×1 IMPLANT
STOCKINETTE 4X48 STRL (DRAPES) ×1 IMPLANT
SUT PROLENE 6 0 CC (SUTURE) ×1 IMPLANT
SUT SILK 2 0 SH (SUTURE) IMPLANT
SUT SILK 3 0 (SUTURE) ×1
SUT SILK 3-0 18XBRD TIE 12 (SUTURE) IMPLANT
SUT VIC AB 3-0 SH 27 (SUTURE) ×2
SUT VIC AB 3-0 SH 27X BRD (SUTURE) ×1 IMPLANT
SYR 10ML LL (SYRINGE) ×1 IMPLANT
SYR 50ML LL SCALE MARK (SYRINGE) IMPLANT
SYR BULB IRRIG 60ML STRL (SYRINGE) ×1 IMPLANT
UNDERPAD 30X36 HEAVY ABSORB (UNDERPADS AND DIAPERS) ×1 IMPLANT
WATER STERILE IRR 1000ML POUR (IV SOLUTION) ×1 IMPLANT

## 2022-06-17 NOTE — Anesthesia Procedure Notes (Signed)
Procedure Name: LMA Insertion Date/Time: 06/17/2022 7:35 AM  Performed by: Orlie Dakin, CRNAPre-anesthesia Checklist: Patient identified, Emergency Drugs available, Suction available and Patient being monitored Patient Re-evaluated:Patient Re-evaluated prior to induction Oxygen Delivery Method: Circle system utilized Preoxygenation: Pre-oxygenation with 100% oxygen Induction Type: IV induction Ventilation: Mask ventilation without difficulty LMA: LMA inserted LMA Size: 4.0 Tube type: Oral Number of attempts: 1 Placement Confirmation: positive ETCO2 Tube secured with: Tape Dental Injury: Teeth and Oropharynx as per pre-operative assessment

## 2022-06-17 NOTE — Transfer of Care (Signed)
Immediate Anesthesia Transfer of Care Note  [name]  Procedure(s) Performed: LEFT ARM SECOND STAGE BASILIC VEIN TRANSPOSITION (Left: Arm Upper)  Patient Location: PACU  Anesthesia Type:General  Level of Consciousness: drowsy  Airway & Oxygen Therapy: Patient Spontanous Breathing and Patient connected to face mask oxygen  Post-op Assessment: Report given to RN and Post -op Vital signs reviewed and stable  Post vital signs: Reviewed and stable  Last Vitals:  Vitals Value Taken Time  BP 176/82 06/17/22 0950  Temp 98.4   Pulse 69 06/17/22 0951  Resp 20 06/17/22 0951  SpO2 100 % 06/17/22 0951  Vitals shown include unvalidated device data.  Last Pain:  Vitals:   06/17/22 0652  PainSc: 0-No pain      Patients Stated Pain Goal: 5 ([id number])  Complications: No notable events documented.

## 2022-06-17 NOTE — Discharge Instructions (Signed)
[facility]  Discharge Instructions  AV Fistula or Graft Surgery for Dialysis Access  Please refer to the following instructions for your post-procedure care. Your surgeon or physician assistant will discuss any changes with you.  Activity  You may drive the day following your surgery, if you are comfortable and no longer taking prescription pain medication. Resume full activity as the soreness in your incision resolves.  Bathing/Showering  You may shower after you go home. Keep your incision dry for 48 hours. Do not soak in a bathtub, hot tub, or swim until the incision heals completely. You may not shower if you have a hemodialysis catheter.  Incision Care  Clean your incision with mild soap and water after 48 hours. Pat the area dry with a clean towel. You do not need a bandage unless otherwise instructed. Do not apply any ointments or creams to your incision. You may have skin glue on your incision. Do not peel it off. It will come off on its own in about one week. Your arm may swell a bit after surgery. To reduce swelling use pillows to elevate your arm so it is above your heart. Your doctor will tell you if you need to lightly wrap your arm with an ACE bandage.  Diet  Resume your normal diet. There are not special food restrictions following this procedure. In order to heal from your surgery, it is CRITICAL to get adequate nutrition. Your body requires vitamins, minerals, and protein. Vegetables are the best source of vitamins and minerals. Vegetables also provide the perfect balance of protein. Processed food has little nutritional value, so try to avoid this.  Medications  Resume taking all of your medications. If your incision is causing pain, you may take over-the counter pain relievers such as acetaminophen (Tylenol). If you were prescribed a stronger pain medication, please be aware these medications can cause nausea and constipation. Prevent  nausea by taking the medication with a snack or meal. Avoid constipation by drinking plenty of fluids and eating foods with high amount of fiber, such as fruits, vegetables, and grains.  Do not take Tylenol if you are taking prescription pain medications.  Follow up Your surgeon may want to see you in the office following your access surgery. If so, this will be arranged at the time of your surgery.  Please call us immediately for any of the following conditions:  Increased pain, redness, drainage (pus) from your incision site Fever of 101 degrees or higher Severe or worsening pain at your incision site Hand pain or numbness.  Reduce your risk of vascular disease:  Stop smoking. If you would like help, call [facility] at 1-800-QUIT-NOW [phone]) or [place] [facility] [place] your cholesterol Maintain a desired weight Control your diabetes Keep your blood pressure down  Dialysis  It will take several weeks to several months for your new dialysis access to be ready for use. Your surgeon will determine when it is okay to use it. Your nephrologist will continue to direct your dialysis. You can continue to use your Permcath until your new access is ready for use.   06/17/2022 Matthew [name] [id number] Sep 24, 1945  Surgeon(s): Ezana Hubbert, Arvilla Meres, MD  Procedure(s): LEFT ARM SECOND STAGE BASILIC VEIN TRANSPOSITION   May stick graft immediately   May stick graft on designated area only:    Do not stick fistula for 6 weeks    If you have any questions, please call the office at  [phone].  

## 2022-06-17 NOTE — Interval H&P Note (Signed)
History and Physical Interval Note:  06/17/2022 7:19 AM  Matthew [name]  has presented today for surgery, with the diagnosis of ESRD.  The various methods of treatment have been discussed with the patient and family. After consideration of risks, benefits and other options for treatment, the patient has consented to  Procedure(s): LEFT ARM SECOND STAGE BASILIC VEIN TRANSPOSITION (Left) as a surgical intervention.  The patient's history has been reviewed, patient examined, no change in status, stable for surgery.  I have reviewed the patient's chart and labs.  Questions were answered to the patient's satisfaction.     Curt Jews

## 2022-06-17 NOTE — Op Note (Signed)
    OPERATIVE REPORT  DATE OF SURGERY: 06/17/2022  [name], 78 y.o. male MRN: [id number]  DOB: 02/14/1946  PRE-OPERATIVE DIAGNOSIS: End-stage renal disease  POST-OPERATIVE DIAGNOSIS:  Same  PROCEDURE: Left second stage basilic vein transposition  SURGEON:  Curt Jews, M.D.  PHYSICIAN ASSISTANT: Fulton Mole, RNFA  The assistant was needed for exposure and to expedite the case  ANESTHESIA: LMA  EBL: per anesthesia record  Total I/O In: 800 [I.V.:700; IV Piggyback:100] Out: 5 [Blood:5]  BLOOD ADMINISTERED: none  DRAINS: none  SPECIMEN: none  COUNTS CORRECT:  YES  PATIENT DISPOSITION:  PACU - hemodynamically stable  PROCEDURE DETAILS: The patient was taken operating placed supine position with area of the left arm and left axilla were prepped and draped in usual sterile fashion.  SonoSite ultrasound was used to visualize the basilic vein fistula and this was marked on the surface of the skin.  Incision was made over the antecubital space and carried down to isolate the prior basilic vein to brachial artery anastomosis.  Several incisions were made on the medial aspect of the upper arm up to the axilla and the basilic vein was mobilized circumferentially.  Multiple tributary branches were ligated and divided.  The vein was occluded near the arterial anastomosis.  The vein was transected near the arterial anastomosis and was mobilized from its native tunnel.  The vein was marked to reduce risk of twisting.  A subcutaneous tunnel was created from the antecubital space to the axilla and the vein was brought back through this tunnel.  The vein was spatulated and was sewn into into itself near the arterial anastomosis with a running 6-0 Prolene suture.  Anastomosis was tested and found to be adequate.  There was an excellent thrill.  The wounds were irrigated with saline.  Hemostasis was obtained with electrocautery.  The wound was closed with 3-0 Vicryl in the  subcutaneous and subcuticular tissue.  Sterile dressing was applied and the patient was transferred to the recovery room in stable condition   [name], M.D., [facility] 06/17/2022 9:58 AM  Note: Portions of this report may have been transcribed using voice recognition software.  Every effort has been made to ensure accuracy; however, inadvertent computerized transcription errors may still be present.

## 2022-06-17 NOTE — Anesthesia Preprocedure Evaluation (Signed)
Anesthesia Evaluation  Patient identified by MRN, date of birth, ID band Patient awake    Reviewed: Allergy & Precautions, H&P , NPO status , Patient's Chart, lab work & pertinent test results, reviewed documented beta blocker date and time   Airway Mallampati: II  TM Distance: >3 FB Neck ROM: full    Dental no notable dental hx.    Pulmonary neg pulmonary ROS, sleep apnea , Current Smoker   Pulmonary exam normal breath sounds clear to auscultation       Cardiovascular Exercise Tolerance: Good hypertension, + CAD, + Past MI, + Peripheral Vascular Disease and +CHF  negative cardio ROS  Rhythm:regular Rate:Normal     Neuro/Psych  PSYCHIATRIC DISORDERS Anxiety     negative neurological ROS  negative psych ROS   GI/Hepatic negative GI ROS, Neg liver ROS,,,  Endo/Other  negative endocrine ROS    Renal/GU ESRF and DialysisRenal diseasenegative Renal ROS  negative genitourinary   Musculoskeletal   Abdominal   Peds  Hematology negative hematology ROS (+)   Anesthesia Other Findings   Reproductive/Obstetrics negative OB ROS                             Anesthesia Physical Anesthesia Plan  ASA: 3  Anesthesia Plan: General and General LMA   Post-op Pain Management:    Induction:   PONV Risk Score and Plan: Ondansetron  Airway Management Planned:   Additional Equipment:   Intra-op Plan:   Post-operative Plan:   Informed Consent: I have reviewed the patients History and Physical, chart, labs and discussed the procedure including the risks, benefits and alternatives for the proposed anesthesia with the patient or authorized representative who has indicated his/her understanding and acceptance.     Dental Advisory Given  Plan Discussed with: CRNA  Anesthesia Plan Comments:        Anesthesia Quick Evaluation

## 2022-06-20 NOTE — Anesthesia Postprocedure Evaluation (Signed)
Anesthesia Post Note  [name]  Procedure(s) Performed: LEFT ARM SECOND STAGE BASILIC VEIN TRANSPOSITION (Left: Arm Upper)  Patient location during evaluation: Phase II Anesthesia Type: General Level of consciousness: awake Pain management: pain level controlled Vital Signs Assessment: post-procedure vital signs reviewed and stable Respiratory status: spontaneous breathing and respiratory function stable Cardiovascular status: blood pressure returned to baseline and stable Postop Assessment: no headache and no apparent nausea or vomiting Anesthetic complications: no Comments: Late entry   No notable events documented.   Last Vitals:  Vitals:   06/17/22 1030 06/17/22 1043  BP: (!) 155/56 (!) 152/73  Pulse: (!) 56 64  Resp: 12 14  Temp:  36.5 C  SpO2: 99% 99%    Last Pain:  Vitals:   06/17/22 1043  TempSrc: Oral  PainSc: 0-No pain                 Louann Sjogren

## 2022-06-23 ENCOUNTER — Encounter (HOSPITAL_COMMUNITY): Payer: Self-pay | Admitting: Vascular Surgery

## 2022-07-30 ENCOUNTER — Ambulatory Visit (INDEPENDENT_AMBULATORY_CARE_PROVIDER_SITE_OTHER): Payer: Medicare Other | Admitting: Vascular Surgery

## 2022-07-30 ENCOUNTER — Encounter: Payer: Self-pay | Admitting: Vascular Surgery

## 2022-07-30 VITALS — BP 114/59 | HR 64 | Temp 97.2°F | Ht 68.0 in | Wt 191.4 lb

## 2022-07-30 DIAGNOSIS — N186 End stage renal disease: Secondary | ICD-10-CM

## 2022-07-30 DIAGNOSIS — Z992 Dependence on renal dialysis: Secondary | ICD-10-CM

## 2022-07-30 NOTE — Progress Notes (Signed)
[facility] [place]  Patient name: Matthew [name] MRN: [id number] DOB: Jan 31, 1946 Sex: male  REASON FOR VISIT: Follow-up left basilic vein transposition fistula  HPI: Matthew [name] is a 77 y.o. male here today for follow-up.  He underwent second stage basilic vein transposition fistula on 06/17/2022.  He has no steal symptoms.  He had no difficulty with his right IJ tunneled catheter  Current Outpatient Medications  Medication Sig Dispense Refill   acetaminophen (TYLENOL) 325 MG tablet Take 2 tablets (650 mg total) by mouth every 6 (six) hours as needed for mild pain. (Patient taking differently: Take 650 mg by mouth every 6 (six) hours as needed (pain.).)     acetaminophen (TYLENOL) 500 MG tablet Take 500 mg by mouth every 6 (six) hours as needed (fever 99.5-101, minor headache, minor discomfort).     alum & mag hydroxide-simeth (MAALOX/MYLANTA) 200-200-20 MG/5ML suspension Take 30 mLs by mouth every 6 (six) hours as needed for indigestion or heartburn. If symptoms persist for longer than 24 hours please contact the physician/provider     apixaban (ELIQUIS) 2.5 MG TABS tablet Take 2.5 mg by mouth 2 (two) times daily.     ascorbic acid (VITAMIN C) 500 MG tablet Take 500 mg by mouth in the morning.     aspirin 81 MG chewable tablet Chew 81 mg by mouth in the morning.     atorvastatin (LIPITOR) 80 MG tablet Take 1 tablet (80 mg total) by mouth daily at 6 PM. (Patient taking differently: Take 80 mg by mouth at bedtime.) 30 tablet 1   famotidine (PEPCID) 20 MG tablet Take 20 mg by mouth every other day as needed for indigestion or heartburn.     febuxostat (ULORIC) 40 MG tablet Take 40 mg by mouth daily.     ferrous sulfate 324 MG TBEC Take 324 mg by mouth every Monday, Wednesday, and Friday.     furosemide (LASIX) 40 MG tablet Take 1 tablet (40 mg total) by mouth 2 (two) times daily. 30 tablet    guaiFENesin (ROBITUSSIN) 100 MG/5ML  liquid Take 10 mLs by mouth every 6 (six) hours as needed for cough or to loosen phlegm. If symptoms persist for longer than 24 hours please contact the physician/provider     loperamide (IMODIUM) 2 MG capsule Take 2 mg by mouth as needed for diarrhea or loose stools.     magnesium hydroxide (MILK OF MAGNESIA) 400 MG/5ML suspension Take 30 mLs by mouth at bedtime as needed for mild constipation. If symptoms persist for longer than 24 hours please contact the physician/provider.     methocarbamol (ROBAXIN) 750 MG tablet Take 750 mg by mouth daily as needed for muscle spasms.     metoprolol tartrate (LOPRESSOR) 100 MG tablet Take 100 mg by mouth 2 (two) times daily.     neomycin-bacitracin-polymyxin [phone] OINT Apply 1 Application topically as needed (minor skin tears or abrasions).     NIFEdipine (ADALAT CC) 60 MG 24 hr tablet Take 60 mg by mouth in the morning.     Omega-3 Fatty Acids (OMEGA-3 FISH OIL PO) Take 1 capsule by mouth in the morning and at bedtime.     oxyCODONE (OXY IR/ROXICODONE) 5 MG immediate release tablet Take 1 tablet (5 mg total) by mouth every 6 (six) hours as needed for severe pain. 12 tablet 0   oxyCODONE-acetaminophen (PERCOCET) 5-325 MG tablet Take 1 tablet by mouth every 6 (six) hours as needed for severe pain. 8 tablet  0   oxyCODONE-acetaminophen (PERCOCET/ROXICET) 5-325 MG tablet Take 1 tablet by mouth 4 (four) times daily as needed (pain).     Sodium Hypochlorite (HYSEPT) 0.5 % SOLN Apply 1 Application topically daily.     Zinc 50 MG TABS Take 50 mg by mouth in the morning.     carvedilol (COREG) 25 MG tablet Take 1 tablet (25 mg total) by mouth 2 (two) times daily. (Patient taking differently: Take 25 mg by mouth every Monday, Wednesday, and Friday.) 180 tablet 1   No current facility-administered medications for this visit.     PHYSICAL EXAM: Vitals:   07/30/22 0927  BP: (!) 114/59  Pulse: 64  Temp: (!) 97.2 F (36.2 C)  SpO2: 93%  Weight: 191 lb 6.4 oz  (86.8 kg)  Height: 5\' 8"  (1.727 m)    GENERAL: The patient is a well-nourished male, in no acute distress. The vital signs are documented above. Excellent healing of all incisions in his left arm.  He does have good caliber basilic vein transposition fistula.  He has an excellent thrill.  The vein is very superficial and has a straight course.  MEDICAL ISSUES: I would wait additional 2 weeks and then begin to transition to his fistula from his catheter.  We are available for catheter removal once he is fully transition to his upper arm fistula.  He will see [place] on an as-needed basis   [name], MD FACS [facility] Tel [phone]  Note: Portions of this report may have been transcribed using voice recognition software.  Every effort has been made to ensure accuracy; however, inadvertent computerized transcription errors may still be present.

## 2022-09-11 ENCOUNTER — Telehealth (HOSPITAL_COMMUNITY): Payer: Self-pay | Admitting: *Deleted

## 2022-09-11 NOTE — Telephone Encounter (Signed)
Received fax from [facility] requesting CVC to be removed after May 17.   Will give to [facility]

## 2022-09-12 ENCOUNTER — Encounter (HOSPITAL_COMMUNITY): Payer: Self-pay

## 2022-09-12 ENCOUNTER — Other Ambulatory Visit: Payer: Self-pay

## 2022-09-12 ENCOUNTER — Emergency Department (HOSPITAL_COMMUNITY)
Admission: EM | Admit: 2022-09-12 | Discharge: 2022-09-12 | Disposition: A | Discharge status: Home / Self Care | Payer: Medicare Other | Attending: Emergency Medicine | Admitting: Emergency Medicine

## 2022-09-12 ENCOUNTER — Emergency Department (HOSPITAL_COMMUNITY): Payer: Medicare Other

## 2022-09-12 DIAGNOSIS — Z7901 Long term (current) use of anticoagulants: Secondary | ICD-10-CM | POA: Insufficient documentation

## 2022-09-12 DIAGNOSIS — I120 Hypertensive chronic kidney disease with stage 5 chronic kidney disease or end stage renal disease: Secondary | ICD-10-CM | POA: Insufficient documentation

## 2022-09-12 DIAGNOSIS — R079 Chest pain, unspecified: Secondary | ICD-10-CM | POA: Diagnosis not present

## 2022-09-12 DIAGNOSIS — Z79899 Other long term (current) drug therapy: Secondary | ICD-10-CM | POA: Insufficient documentation

## 2022-09-12 DIAGNOSIS — Z7982 Long term (current) use of aspirin: Secondary | ICD-10-CM | POA: Diagnosis not present

## 2022-09-12 DIAGNOSIS — Z743 Need for continuous supervision: Secondary | ICD-10-CM | POA: Diagnosis not present

## 2022-09-12 DIAGNOSIS — F17210 Nicotine dependence, cigarettes, uncomplicated: Secondary | ICD-10-CM | POA: Diagnosis not present

## 2022-09-12 DIAGNOSIS — R069 Unspecified abnormalities of breathing: Secondary | ICD-10-CM | POA: Diagnosis not present

## 2022-09-12 DIAGNOSIS — Z1152 Encounter for screening for COVID-19: Secondary | ICD-10-CM | POA: Diagnosis not present

## 2022-09-12 DIAGNOSIS — I2510 Atherosclerotic heart disease of native coronary artery without angina pectoris: Secondary | ICD-10-CM | POA: Insufficient documentation

## 2022-09-12 DIAGNOSIS — J9601 Acute respiratory failure with hypoxia: Secondary | ICD-10-CM | POA: Diagnosis not present

## 2022-09-12 DIAGNOSIS — N250 Renal osteodystrophy: Secondary | ICD-10-CM | POA: Diagnosis not present

## 2022-09-12 DIAGNOSIS — N186 End stage renal disease: Secondary | ICD-10-CM | POA: Insufficient documentation

## 2022-09-12 DIAGNOSIS — Z992 Dependence on renal dialysis: Secondary | ICD-10-CM | POA: Insufficient documentation

## 2022-09-12 DIAGNOSIS — E8770 Fluid overload, unspecified: Secondary | ICD-10-CM

## 2022-09-12 DIAGNOSIS — R0600 Dyspnea, unspecified: Secondary | ICD-10-CM | POA: Diagnosis not present

## 2022-09-12 DIAGNOSIS — R0602 Shortness of breath: Secondary | ICD-10-CM | POA: Diagnosis not present

## 2022-09-12 DIAGNOSIS — R6889 Other general symptoms and signs: Secondary | ICD-10-CM | POA: Diagnosis not present

## 2022-09-12 DIAGNOSIS — I161 Hypertensive emergency: Secondary | ICD-10-CM | POA: Diagnosis not present

## 2022-09-12 DIAGNOSIS — D631 Anemia in chronic kidney disease: Secondary | ICD-10-CM | POA: Diagnosis not present

## 2022-09-12 LAB — CBC WITH DIFFERENTIAL/PLATELET
Abs Immature Granulocytes: 0.01 10*3/uL (ref 0.00–0.07)
Basophils Absolute: 0 10*3/uL (ref 0.0–0.1)
Basophils Relative: 1 %
Eosinophils Absolute: 0.5 10*3/uL (ref 0.0–0.5)
Eosinophils Relative: 7 %
HCT: 32.4 % — ABNORMAL LOW (ref 39.0–52.0)
Hemoglobin: 10.2 g/dL — ABNORMAL LOW (ref 13.0–17.0)
Immature Granulocytes: 0 %
Lymphocytes Relative: 19 %
Lymphs Abs: 1.3 10*3/uL (ref 0.7–4.0)
MCH: 29.1 pg (ref 26.0–34.0)
MCHC: 31.5 g/dL (ref 30.0–36.0)
MCV: 92.3 fL (ref 80.0–100.0)
Monocytes Absolute: 0.6 10*3/uL (ref 0.1–1.0)
Monocytes Relative: 8 %
Neutro Abs: 4.7 10*3/uL (ref 1.7–7.7)
Neutrophils Relative %: 65 %
Platelets: 201 10*3/uL (ref 150–400)
RBC: 3.51 MIL/uL — ABNORMAL LOW (ref 4.22–5.81)
RDW: 14.4 % (ref 11.5–15.5)
WBC: 7.1 10*3/uL (ref 4.0–10.5)
nRBC: 0 % (ref 0.0–0.2)

## 2022-09-12 LAB — RESP PANEL BY RT-PCR (RSV, FLU A&B, COVID)  RVPGX2
Influenza A by PCR: NEGATIVE
Influenza B by PCR: NEGATIVE
Resp Syncytial Virus by PCR: NEGATIVE
SARS Coronavirus 2 by RT PCR: NEGATIVE

## 2022-09-12 LAB — TROPONIN I (HIGH SENSITIVITY)
Troponin I (High Sensitivity): 5 ng/L (ref <18)
Troponin I (High Sensitivity): 6 ng/L (ref <18)

## 2022-09-12 LAB — COMPREHENSIVE METABOLIC PANEL
ALT: 15 U/L (ref 0–44)
AST: 15 U/L (ref 15–41)
Albumin: 3.4 g/dL — ABNORMAL LOW (ref 3.5–5.0)
Alkaline Phosphatase: 108 U/L (ref 38–126)
Anion gap: 10 (ref 5–15)
BUN: 40 mg/dL — ABNORMAL HIGH (ref 8–23)
CO2: 21 mmol/L — ABNORMAL LOW (ref 22–32)
Calcium: 8 mg/dL — ABNORMAL LOW (ref 8.9–10.3)
Chloride: 108 mmol/L (ref 98–111)
Creatinine, Ser: 5.25 mg/dL — ABNORMAL HIGH (ref 0.61–1.24)
GFR, Estimated: 11 mL/min — ABNORMAL LOW (ref >60)
Glucose, Bld: 113 mg/dL — ABNORMAL HIGH (ref 70–99)
Potassium: 3.8 mmol/L (ref 3.5–5.1)
Sodium: 139 mmol/L (ref 135–145)
Total Bilirubin: 0.7 mg/dL (ref 0.3–1.2)
Total Protein: 7.3 g/dL (ref 6.5–8.1)

## 2022-09-12 LAB — HEPATITIS B SURFACE ANTIGEN: Hepatitis B Surface Ag: NONREACTIVE

## 2022-09-12 MED ORDER — LABETALOL HCL 5 MG/ML IV SOLN
20.0000 mg | Freq: Once | INTRAVENOUS | Status: DC
  Filled 2022-09-12: qty 4

## 2022-09-12 MED ORDER — IPRATROPIUM-ALBUTEROL 0.5-2.5 (3) MG/3ML IN SOLN
3.0000 mL | Freq: Once | RESPIRATORY_TRACT | Status: AC
  Administered 2022-09-12: 3 mL via RESPIRATORY_TRACT
  Filled 2022-09-12: qty 3

## 2022-09-12 MED ORDER — CHLORHEXIDINE GLUCONATE CLOTH 2 % EX PADS: 6.0000 | MEDICATED_PAD | Freq: Every day | CUTANEOUS | Status: DC

## 2022-09-12 MED ORDER — HYDRALAZINE HCL 25 MG PO TABS
50.0000 mg | ORAL_TABLET | Freq: Once | ORAL | Status: AC
  Administered 2022-09-12: 50 mg via ORAL
  Filled 2022-09-12: qty 2

## 2022-09-12 MED ORDER — ALTEPLASE 2 MG IJ SOLR: 2.0000 mg | Freq: Once | INTRAMUSCULAR | Status: DC | PRN

## 2022-09-12 MED ORDER — LABETALOL HCL 5 MG/ML IV SOLN
10.0000 mg | Freq: Once | INTRAVENOUS | Status: AC
  Administered 2022-09-12: 10 mg via INTRAVENOUS

## 2022-09-12 MED ORDER — HEPARIN SODIUM (PORCINE) 1000 UNIT/ML IJ SOLN
INTRAMUSCULAR | Status: AC
  Filled 2022-09-12: qty 6

## 2022-09-12 MED ORDER — ACETAMINOPHEN 500 MG PO TABS
1000.0000 mg | ORAL_TABLET | Freq: Once | ORAL | Status: AC
  Administered 2022-09-12: 1000 mg via ORAL
  Filled 2022-09-12: qty 2

## 2022-09-12 MED ORDER — HEPARIN SODIUM (PORCINE) 1000 UNIT/ML DIALYSIS: 1000.0000 [IU] | INTRAMUSCULAR | Status: DC | PRN

## 2022-09-12 NOTE — Discharge Instructions (Signed)
We evaluated you for your shortness of breath.  You received dialysis and your symptoms improved.    We noticed your blood pressure was high in the emergency department.  Please be sure you are taking all of your blood pressure medications at home and keep a log of your blood pressure measurements and please follow-up closely with your primary doctor.  If you have any new or worsening symptoms such as recurrent shortness of breath, vision changes, headaches, chest pain, abdominal pain, or any other concerning symptoms, please return to the emergency department.

## 2022-09-12 NOTE — ED Triage Notes (Signed)
Pt from [facility] c/o SOB. Pt was 86% on RA and was placed on 4L [place]. Pt has some congestion and chest pain when laying down.

## 2022-09-12 NOTE — ED Provider Notes (Signed)
[facility]PARTMENT AT [facility] Provider Note   CSN: [id number] Arrival date & time: 09/12/22  0321     History  Chief Complaint  Patient presents with   Shortness of Breath    Matthew [name] is a 77 y.o. male.  Patient is a 77 year old male with past medical history of end-stage renal disease on hemodialysis, hypertension, hyperlipidemia, sleep apnea, coronary artery disease.  Patient presenting today with complaints of shortness of breath.  This began earlier this evening.  EMS was called and patient was found to have oxygen saturations in the mid 80s.  He was placed on nasal cannula, then brought here for evaluation.  Patient tells me he missed dialysis on Wednesday due to diarrhea and his last dialysis session was Monday.  The history is provided by the patient.       Home Medications Prior to Admission medications   Medication Sig Start Date End Date Taking? Authorizing Provider  acetaminophen (TYLENOL) 325 MG tablet Take 2 tablets (650 mg total) by mouth every 6 (six) hours as needed for mild pain. Patient taking differently: Take 650 mg by mouth every 6 (six) hours as needed (pain.). 01/03/18   Sharlene Dory, PA-C  acetaminophen (TYLENOL) 500 MG tablet Take 500 mg by mouth every 6 (six) hours as needed (fever 99.5-101, minor headache, minor discomfort).    [provider]  alum & mag hydroxide-simeth (MAALOX/MYLANTA) 200-200-20 MG/5ML suspension Take 30 mLs by mouth every 6 (six) hours as needed for indigestion or heartburn. If symptoms persist for longer than 24 hours please contact the physician/provider    [provider]  apixaban (ELIQUIS) 2.5 MG TABS tablet Take 2.5 mg by mouth 2 (two) times daily.    [provider]  ascorbic acid (VITAMIN C) 500 MG tablet Take 500 mg by mouth in the morning.    [provider]  aspirin 81 MG chewable tablet Chew 81 mg by mouth in the morning.    [provider]   atorvastatin (LIPITOR) 80 MG tablet Take 1 tablet (80 mg total) by mouth daily at 6 PM. Patient taking differently: Take 80 mg by mouth at bedtime. 01/03/18   Sharlene Dory, PA-C  carvedilol (COREG) 25 MG tablet Take 1 tablet (25 mg total) by mouth 2 (two) times daily. Patient taking differently: Take 25 mg by mouth every Monday, Wednesday, and Friday. 03/09/19 06/17/22  Antoine Poche, MD  famotidine (PEPCID) 20 MG tablet Take 20 mg by mouth every other day as needed for indigestion or heartburn.    [provider]  febuxostat (ULORIC) 40 MG tablet Take 40 mg by mouth daily.    [provider]  ferrous sulfate 324 MG TBEC Take 324 mg by mouth every Monday, Wednesday, and Friday.    [provider]  furosemide (LASIX) 40 MG tablet Take 1 tablet (40 mg total) by mouth 2 (two) times daily. 03/09/22   Catarina Hartshorn, MD  guaiFENesin (ROBITUSSIN) 100 MG/5ML liquid Take 10 mLs by mouth every 6 (six) hours as needed for cough or to loosen phlegm. If symptoms persist for longer than 24 hours please contact the physician/provider    [provider]  loperamide (IMODIUM) 2 MG capsule Take 2 mg by mouth as needed for diarrhea or loose stools.    [provider]  magnesium hydroxide (MILK OF MAGNESIA) 400 MG/5ML suspension Take 30 mLs by mouth at bedtime as needed for mild constipation. If symptoms persist for  longer than 24 hours please contact the physician/provider.    [provider]  methocarbamol (ROBAXIN) 750 MG tablet Take 750 mg by mouth daily as needed for muscle spasms.    [provider]  metoprolol tartrate (LOPRESSOR) 100 MG tablet Take 100 mg by mouth 2 (two) times daily.    [provider]  neomycin-bacitracin-polymyxin [phone] OINT Apply 1 Application topically as needed (minor skin tears or abrasions).    [provider]  NIFEdipine (ADALAT CC) 60 MG 24 hr tablet Take 60 mg by mouth in the morning.    [provider]  Omega-3 Fatty Acids (OMEGA-3 FISH OIL PO) Take 1 capsule by mouth in the morning and at bedtime.    [provider]  oxyCODONE (OXY IR/ROXICODONE) 5 MG immediate release tablet Take 1 tablet (5 mg total) by mouth every 6 (six) hours as needed for severe pain. 03/09/22   Catarina Hartshorn, MD  oxyCODONE-acetaminophen (PERCOCET) 5-325 MG tablet Take 1 tablet by mouth every 6 (six) hours as needed for severe pain. 06/17/22   Larina Earthly, MD  oxyCODONE-acetaminophen (PERCOCET/ROXICET) 5-325 MG tablet Take 1 tablet by mouth 4 (four) times daily as needed (pain).    [provider]  Sodium Hypochlorite (HYSEPT) 0.5 % SOLN Apply 1 Application topically daily.    [provider]  Zinc 50 MG TABS Take 50 mg by mouth in the morning.    [provider]      Allergies    Tramadol hcl    Review of Systems   Review of Systems  All other systems reviewed and are negative.   Physical Exam Updated Vital Signs BP (!) 217/86   Pulse 75   Resp (!) 24   SpO2 100%  Physical Exam Vitals and nursing note reviewed.  Constitutional:      General: He is not in acute distress.    Appearance: He is well-developed. He is not diaphoretic.  HENT:     Head: Normocephalic and atraumatic.  Cardiovascular:     Rate and Rhythm: Normal rate and regular rhythm.     Heart sounds: No murmur heard.    No friction rub.  Pulmonary:     Effort: Pulmonary effort is normal. No respiratory distress.     Breath sounds: Normal breath sounds. No wheezing or rales.  Abdominal:     General: Bowel sounds are normal. There is no distension.     Palpations: Abdomen is soft.     Tenderness: There is no abdominal tenderness.  Musculoskeletal:        General: Normal range of motion.     Cervical back: Normal range of motion and neck supple.  Skin:    General: Skin is warm and dry.  Neurological:     Mental Status: He is alert and oriented to person, place, and time.      Coordination: Coordination normal.     ED Results / Procedures / Treatments   Labs (all labs ordered are listed, but only abnormal results are displayed) Labs Reviewed - No data to display  EKG EKG Interpretation  Date/Time:  Friday Sep 12 2022 03:30:30 EDT Ventricular Rate:  75 PR Interval:  176 QRS Duration: 104 QT Interval:  451 QTC Calculation: 504 R Axis:   -20 Text Interpretation: Sinus rhythm Probable left atrial enlargement Borderline left axis deviation Probable anteroseptal infarct, old Prolonged QT interval Confirmed by Geoffery Lyons ([id number]) on 09/12/2022 4:08:27 AM  Radiology No results found.  Procedures  Procedures    Medications Ordered in ED Medications  ipratropium-albuterol (DUONEB) 0.5-2.5 (3) MG/3ML nebulizer solution 3 mL (has no administration in time range)    ED Course/ Medical Decision Making/ A&P  Patient is a 77 year old male with history of end-stage renal disease presenting with shortness of breath and low oxygen saturations.  He skipped his dialysis on Wednesday due to diarrhea.  Patient arrives here markedly hypertensive with blood pressure of 217 systolic.  He is also requiring oxygen at 4 L nasal cannula to maintain oxygen saturations in the upper 90s.  I am told he was 86% with EMS.  Workup initiated including CBC and metabolic panel.  Studies consistent with baseline.  There is no hyperkalemia and no acidosis.  Chest x-ray consistent with volume overload.  Patient given IV labetalol for his hypertension.  I have discussed care with Dr. Thedore Mins from nephrology.  Because of his dyspnea and hypoxia likely volume overload due to skipping dialysis.  This would also explain his marked hypertension.  Arrangements will be made for patient to be dialyzed here at [facility] in the morning.  If he improves his blood pressure and oxygenation after dialysis, patient likely can be discharged.  If not, patient will require admission.  CRITICAL  CARE Performed by: Geoffery Lyons Total critical care time: 40 minutes Critical care time was exclusive of separately billable procedures and treating other patients. Critical care was necessary to treat or prevent imminent or life-threatening deterioration. Critical care was time spent personally by me on the following activities: development of treatment plan with patient and/or surrogate as well as nursing, discussions with consultants, evaluation of patient's response to treatment, examination of patient, obtaining history from patient or surrogate, ordering and performing treatments and interventions, ordering and review of laboratory studies, ordering and review of radiographic studies, pulse oximetry and re-evaluation of patient's condition.   Final Clinical Impression(s) / ED Diagnoses Final diagnoses:  None    Rx / DC Orders ED Discharge Orders     None         Geoffery Lyons, MD 09/12/22 [phone]

## 2022-09-12 NOTE — Progress Notes (Signed)
  HEMODIALYSIS TREATMENT NOTE:  4 hour heparin-free treatment completed using RIJ TDC. Cath exit site is unremarkable. 3 liters removed.  Pt saturating 100% on room air post-HD but remains hypertensive with SBP>200.  Of note, he took Hydralazine 50mg  prior to HD session.    Post-HD:  09/12/22 1330  Vitals  Temp 98 F (36.7 C)  Temp Source Oral  BP (!) 212/90  MAP (mmHg) 131  BP Location Right Arm  BP Method Automatic  Patient Position (if appropriate) Sitting  Pulse Rate 78  Pulse Rate Source Monitor  ECG Heart Rate 79  Resp 20  Oxygen Therapy  SpO2 100 %  O2 Device Room Air  Post Treatment  Dialyzer Clearance Lightly streaked  Duration of HD Treatment -hour(s) 4 hour(s)  Hemodialysis Intake (mL) 0 mL  Liters Processed 96  Fluid Removed (mL) 3000 mL  Tolerated HD Treatment Yes  Post-Hemodialysis Comments Adjusted goal met  Hemodialysis Catheter Right Internal jugular Double lumen Permanent (Tunneled)  Placement Date/Time: 03/06/22 1306   Placed prior to admission: No  Serial / Lot #: [id number]  Expiration Date: 08/31/26  Time Out: Correct patient;Correct site;Correct procedure  Maximum sterile barrier precautions: Hand hygiene;Cap;Mask;Sterile gow...  Site Condition No complications  Blue Lumen Status Flushed;Heparin locked;Dead end cap in place  Red Lumen Status Flushed;Heparin locked;Dead end cap in place  Purple Lumen Status N/A  Catheter fill solution Heparin 1000 units/ml  Catheter fill volume (Arterial) 1.9 cc  Catheter fill volume (Venous) 1.9  Dressing Type Transparent;Tube stabilization device  Dressing Status Antimicrobial disc in place;Clean, Dry, Intact  Interventions New dressing  Drainage Description None  Dressing Change Due 09/19/22  Post treatment catheter status Capped and Clamped   Pt was transported back to ED to await disposition.  Hand-off given to [employer].  Dr. Suezanne Jacquet was notified of above.   Arman Filter, RN AP K[facility]

## 2022-09-12 NOTE — ED Provider Notes (Signed)
    ED Course / MDM   Clinical Course as of 09/12/22 1505  Fri Sep 12, 2022  1610 Received sign out pending dialysis and re-assessment.  [WS]  1420 Patient reports he feels well after dialysis and requests discharge. He is still hypertensive but denies any headaches, visual changes, difficulty breathing, numbness or tingling, or other symptoms. Advised him to continue with home blood pressure medications at home and continue to monitor. Will discharge patient to home. All questions answered. Patient comfortable with plan of discharge. Return precautions discussed with patient and specified on the after visit summary.  [WS]    Clinical Course User Index [WS] Lonell Grandchild, MD   Medical Decision Making Amount and/or Complexity of Data Reviewed Labs: ordered. Radiology: ordered.  Risk OTC drugs. Prescription drug management.          Lonell Grandchild, MD 09/12/22 1505

## 2022-09-12 NOTE — ED Notes (Addendum)
RN spoke with Dr. Rhoderick Moody about patient blood pressure. Advised if patient not symptomatic no adjustments or medications at this time

## 2022-09-12 NOTE — ED Notes (Signed)
Patient has no complaints at this time and no needs, Patient will be updated on a dialysis time when it is learned

## 2022-09-12 NOTE — Procedures (Signed)
I was present at this dialysis session. I have reviewed the session itself and made appropriate changes.   UF goal 4L.  SBP remain > 200 w/in first hour of HD.  Using R IJ TDC.    Filed Weights   09/12/22 0855  Weight: 85.2 kg    Recent Labs  Lab 09/12/22 0325  NA 139  K 3.8  CL 108  CO2 21*  GLUCOSE 113*  BUN 40*  CREATININE 5.25*  CALCIUM 8.0*    Recent Labs  Lab 09/12/22 0325  WBC 7.1  NEUTROABS 4.7  HGB 10.2*  HCT 32.4*  MCV 92.3  PLT 201    Scheduled Meds:  Chlorhexidine Gluconate Cloth  6 each Topical Q0600   Continuous Infusions: PRN Meds:.alteplase, heparin   Sabra Heck  MD 09/12/2022, 10:42 AM
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
## 2022-09-13 LAB — HEPATITIS B SURFACE ANTIBODY, QUANTITATIVE: Hep B S AB Quant (Post): 31695 m[IU]/mL (ref >9.9)

## 2022-09-16 ENCOUNTER — Telehealth: Payer: Self-pay

## 2022-09-16 NOTE — Telephone Encounter (Signed)
Called pt to schedule his CVC removal. Left voicemail for him to call us back.

## 2022-09-22 ENCOUNTER — Other Ambulatory Visit: Payer: Self-pay

## 2022-09-22 NOTE — Telephone Encounter (Signed)
Received a call from [place] at [facility]. Scheduled patient's TDC removal. Instructions provided and will be faxed to facility to provide to patient. She voiced understanding.

## 2022-09-30 ENCOUNTER — Encounter (HOSPITAL_COMMUNITY)
Admission: RE | Disposition: A | Discharge status: Home / Self Care | Payer: Self-pay | Source: Home / Self Care | Attending: Vascular Surgery

## 2022-09-30 ENCOUNTER — Ambulatory Visit (HOSPITAL_COMMUNITY)
Admission: RE | Admit: 2022-09-30 | Discharge: 2022-09-30 | Disposition: A | Discharge status: Home / Self Care | Payer: No Typology Code available for payment source | Attending: Vascular Surgery | Admitting: Vascular Surgery

## 2022-09-30 DIAGNOSIS — Z4901 Encounter for fitting and adjustment of extracorporeal dialysis catheter: Secondary | ICD-10-CM | POA: Insufficient documentation

## 2022-09-30 DIAGNOSIS — N186 End stage renal disease: Secondary | ICD-10-CM | POA: Diagnosis not present

## 2022-09-30 DIAGNOSIS — N185 Chronic kidney disease, stage 5: Secondary | ICD-10-CM

## 2022-09-30 HISTORY — PX: REMOVAL OF A DIALYSIS CATHETER: SHX6053

## 2022-09-30 SURGERY — REMOVAL, DIALYSIS CATHETER
Anesthesia: LOCAL

## 2022-09-30 MED ORDER — LIDOCAINE HCL (PF) 1 % IJ SOLN
INTRAMUSCULAR | Status: AC
  Filled 2022-09-30: qty 30

## 2022-09-30 MED ORDER — LIDOCAINE HCL (PF) 1 % IJ SOLN
INTRAMUSCULAR | Status: DC | PRN
  Administered 2022-09-30: 4 mg

## 2022-09-30 SURGICAL SUPPLY — 20 items
ADH SKN CLS APL DERMABOND .7 (GAUZE/BANDAGES/DRESSINGS) ×1
APL PRP STRL LF ISPRP CHG 10.5 (MISCELLANEOUS) ×1
APPLICATOR CHLORAPREP 10.5 ORG (MISCELLANEOUS) ×1 IMPLANT
CLOTH BEACON ORANGE TIMEOUT ST (SAFETY) ×1 IMPLANT
DECANTER SPIKE VIAL GLASS SM (MISCELLANEOUS) ×1 IMPLANT
DERMABOND ADVANCED .7 DNX12 (GAUZE/BANDAGES/DRESSINGS) ×1 IMPLANT
DRAPE HALF SHEET 40X57 (DRAPES) IMPLANT
ELECT REM PT RETURN 9FT ADLT (ELECTROSURGICAL) ×1
ELECTRODE REM PT RTRN 9FT ADLT (ELECTROSURGICAL) ×1 IMPLANT
GLOVE BIOGEL PI IND STRL 7.0 (GLOVE) ×2 IMPLANT
GOWN STRL REUS W/TWL LRG LVL3 (GOWN DISPOSABLE) IMPLANT
NDL HYPO 25X1 1.5 SAFETY (NEEDLE) ×1 IMPLANT
NEEDLE HYPO 25X1 1.5 SAFETY (NEEDLE) ×1 IMPLANT
PENCIL SMOKE EVACUATOR COATED (MISCELLANEOUS) IMPLANT
SPONGE GAUZE 2X2 8PLY STRL LF (GAUZE/BANDAGES/DRESSINGS) ×1 IMPLANT
SUT MNCRL AB 4-0 PS2 18 (SUTURE) ×1 IMPLANT
SUT VIC AB 3-0 SH 27 (SUTURE) ×1
SUT VIC AB 3-0 SH 27X BRD (SUTURE) ×1 IMPLANT
SYR CONTROL 10ML LL (SYRINGE) ×1 IMPLANT
TOWEL OR 17X26 4PK STRL BLUE (TOWEL DISPOSABLE) ×1 IMPLANT

## 2022-09-30 NOTE — Op Note (Signed)
    OPERATIVE REPORT  DATE OF SURGERY: 09/30/2022  [name], 77 y.o. male MRN: [id number]  DOB: 1945-07-30  PRE-OPERATIVE DIAGNOSIS: End-stage renal disease  POST-OPERATIVE DIAGNOSIS:  Same  PROCEDURE: Removal of right IJ tunneled catheter  SURGEON:  Gretta Began, M.D.  PHYSICIAN ASSISTANT: Nurse  The assistant was needed for exposure and to expedite the case  ANESTHESIA: 1% lidocaine local  EBL: per anesthesia record  No intake/output data recorded.  BLOOD ADMINISTERED: none  DRAINS: none  SPECIMEN: none  COUNTS CORRECT:  YES  PATIENT DISPOSITION:  PACU - hemodynamically stable  PROCEDURE DETAILS: The patient was taken to the minor procedure room.  The area of his current right IJ catheter was prepped and draped in usual sterile fashion.  The cuff was at the exit site.  1% lidocaine local was used to anesthetize the exit site and the cuff was removed through the exit site incision.  The catheter was removed in's entirety.  Pressure was held for hemostasis.  Sterile dressing was applied.  The patient was discharged to home   Larina Earthly, M.D., [facility] 09/30/2022 9:12 AM  Note: Portions of this report may have been transcribed using voice recognition software.  Every effort has been made to ensure accuracy; however, inadvertent computerized transcription errors may still be present.

## 2022-09-30 NOTE — H&P (Signed)
Office Visit 07/30/2022 [facility]    Jamill Wetmore, Kristen Loader, MD Vascular Surgery ESRD on dialysis [facility]) Dx Post-op Follow-up; Referred by Sherwood Gambler, MD Reason for Visit   Additional Documentation  Vitals: BP 114/59 Important   (BP Location: Right Arm, Patient Position: Sitting, Cuff Size: Normal)   Pulse 64   Temp 97.2 F (36.2 C) Important    Ht 5\' 8"  (1.727 m)   Wt 86.8 kg   SpO2 93%   BMI 29.10 kg/m   BSA 2.04 m   Pain [place] 0-No pain  Flowsheets: Clinical Intake,   NEWS,   MEWS Score,   Vital Signs,   Anthropometrics,   Method of Visit  Encounter Info: Billing Info,   History,   Allergies,   Detailed Report   All Notes   Progress Notes by Larina Earthly, MD at 07/30/2022 9:10 AM  Author: Larina Earthly, MD Author Type: Physician Filed: 07/30/2022 10:47 AM  Note Status: Signed Cosign: Cosign Not Required Encounter Date: 07/30/2022  Editor: Larina Earthly, MD (Physician)                                                  [facility] [place]   Patient name: Matthew [name]           MRN: [id number]        DOB: [birth year]          Sex: male   REASON FOR VISIT: Follow-up left basilic vein transposition fistula   HPI: Matthew [name] is a 77 y.o. male here today for follow-up.  He underwent second stage basilic vein transposition fistula on 06/17/2022.  He has no steal symptoms.  He had no difficulty with his right IJ tunneled catheter         Current Outpatient Medications  Medication Sig Dispense Refill   acetaminophen (TYLENOL) 325 MG tablet Take 2 tablets (650 mg total) by mouth every 6 (six) hours as needed for mild pain. (Patient taking differently: Take 650 mg by mouth every 6 (six) hours as needed (pain.).)       acetaminophen (TYLENOL) 500 MG tablet Take 500 mg by mouth every 6 (six) hours as needed (fever 99.5-101, minor headache, minor discomfort).       alum & mag hydroxide-simeth  (MAALOX/MYLANTA) 200-200-20 MG/5ML suspension Take 30 mLs by mouth every 6 (six) hours as needed for indigestion or heartburn. If symptoms persist for longer than 24 hours please contact the physician/provider       apixaban (ELIQUIS) 2.5 MG TABS tablet Take 2.5 mg by mouth 2 (two) times daily.       ascorbic acid (VITAMIN C) 500 MG tablet Take 500 mg by mouth in the morning.       aspirin 81 MG chewable tablet Chew 81 mg by mouth in the morning.       atorvastatin (LIPITOR) 80 MG tablet Take 1 tablet (80 mg total) by mouth daily at 6 PM. (Patient taking differently: Take 80 mg by mouth at bedtime.) 30 tablet 1   famotidine (PEPCID) 20 MG tablet Take 20 mg by mouth every other day as needed for indigestion or heartburn.       febuxostat (ULORIC) 40 MG tablet Take 40 mg by mouth daily.       ferrous  sulfate 324 MG TBEC Take 324 mg by mouth every Monday, Wednesday, and Friday.       furosemide (LASIX) 40 MG tablet Take 1 tablet (40 mg total) by mouth 2 (two) times daily. 30 tablet     guaiFENesin (ROBITUSSIN) 100 MG/5ML liquid Take 10 mLs by mouth every 6 (six) hours as needed for cough or to loosen phlegm. If symptoms persist for longer than 24 hours please contact the physician/provider       loperamide (IMODIUM) 2 MG capsule Take 2 mg by mouth as needed for diarrhea or loose stools.       magnesium hydroxide (MILK OF MAGNESIA) 400 MG/5ML suspension Take 30 mLs by mouth at bedtime as needed for mild constipation. If symptoms persist for longer than 24 hours please contact the physician/provider.       methocarbamol (ROBAXIN) 750 MG tablet Take 750 mg by mouth daily as needed for muscle spasms.       metoprolol tartrate (LOPRESSOR) 100 MG tablet Take 100 mg by mouth 2 (two) times daily.       neomycin-bacitracin-polymyxin [phone] OINT Apply 1 Application topically as needed (minor skin tears or abrasions).       NIFEdipine (ADALAT CC) 60 MG 24 hr tablet Take 60 mg by mouth in the morning.        Omega-3 Fatty Acids (OMEGA-3 FISH OIL PO) Take 1 capsule by mouth in the morning and at bedtime.       oxyCODONE (OXY IR/ROXICODONE) 5 MG immediate release tablet Take 1 tablet (5 mg total) by mouth every 6 (six) hours as needed for severe pain. 12 tablet 0   oxyCODONE-acetaminophen (PERCOCET) 5-325 MG tablet Take 1 tablet by mouth every 6 (six) hours as needed for severe pain. 8 tablet 0   oxyCODONE-acetaminophen (PERCOCET/ROXICET) 5-325 MG tablet Take 1 tablet by mouth 4 (four) times daily as needed (pain).       Sodium Hypochlorite (HYSEPT) 0.5 % SOLN Apply 1 Application topically daily.       Zinc 50 MG TABS Take 50 mg by mouth in the morning.       carvedilol (COREG) 25 MG tablet Take 1 tablet (25 mg total) by mouth 2 (two) times daily. (Patient taking differently: Take 25 mg by mouth every Monday, Wednesday, and Friday.) 180 tablet 1    No current facility-administered medications for this visit.        PHYSICAL EXAM:    Vitals:    07/30/22 0927  BP: (!) 114/59  Pulse: 64  Temp: (!) 97.2 F (36.2 C)  SpO2: 93%  Weight: 191 lb 6.4 oz (86.8 kg)  Height: 5\' 8"  (1.727 m)      GENERAL: The patient is a well-nourished male, in no acute distress. The vital signs are documented above. Excellent healing of all incisions in his left arm.  He does have good caliber basilic vein transposition fistula.  He has an excellent thrill.  The vein is very superficial and has a straight course.   MEDICAL ISSUES: I would wait additional 2 weeks and then begin to transition to his fistula from his catheter.  We are available for catheter removal once he is fully transition to his upper arm fistula.  He will see [place] on an as-needed basis     Larina Earthly, MD FACS [facility] Tel [phone]   Note: Portions of this report may have been transcribed using voice recognition software.  Every effort  has been made to ensure accuracy; however, inadvertent  computerized transcription errors may still be present.      Addendum:  The patient has been re-examined and re-evaluated.  The patient's history and physical has been reviewed and is unchanged.    Matthew [name] is a 77 y.o. male is being admitted with ESRD. All the risks, benefits and other treatment options have been discussed with the patient. The patient has consented to proceed with Procedure(s): MINOR REMOVAL OF A TUNNELED DIALYSIS CATHETER as a surgical intervention.  Gretta Began 09/30/2022 8:54 AM Vascular and Vein Surgery

## 2022-09-30 NOTE — Progress Notes (Signed)
Patient was listed as blood refusal.  Patient states he would take blood if needed to save his life.  I was unable to change this in the time out.

## 2022-10-01 ENCOUNTER — Encounter (HOSPITAL_COMMUNITY): Payer: Self-pay | Admitting: Vascular Surgery

## 2022-10-27 ENCOUNTER — Telehealth (HOSPITAL_COMMUNITY): Payer: Self-pay | Admitting: *Deleted

## 2022-10-27 NOTE — Telephone Encounter (Signed)
Received fax from [facility] requesting fistulogram due to clotting from access. Will give to [facility].

## 2022-10-31 ENCOUNTER — Other Ambulatory Visit: Payer: Self-pay

## 2022-10-31 DIAGNOSIS — T82590A Other mechanical complication of surgically created arteriovenous fistula, initial encounter: Secondary | ICD-10-CM

## 2022-10-31 DIAGNOSIS — N186 End stage renal disease: Secondary | ICD-10-CM

## 2022-10-31 MED ORDER — SODIUM CHLORIDE 0.9% FLUSH
3.0000 mL | Freq: Two times a day (BID) | INTRAVENOUS | Status: AC
Start: 2022-10-31 — End: ?

## 2022-10-31 MED ORDER — SODIUM CHLORIDE 0.9 % IV SOLN
250.0000 mL | INTRAVENOUS | Status: AC | PRN
Start: 2022-10-31 — End: ?

## 2022-11-04 ENCOUNTER — Other Ambulatory Visit: Payer: Self-pay

## 2022-11-04 ENCOUNTER — Encounter (HOSPITAL_COMMUNITY)
Admission: RE | Disposition: A | Discharge status: Home / Self Care | Payer: Self-pay | Source: Home / Self Care | Attending: Surgery

## 2022-11-04 ENCOUNTER — Ambulatory Visit (HOSPITAL_COMMUNITY)
Admission: RE | Admit: 2022-11-04 | Discharge: 2022-11-04 | Disposition: A | Discharge status: Home / Self Care | Payer: No Typology Code available for payment source | Attending: Surgery | Admitting: Surgery

## 2022-11-04 DIAGNOSIS — Z992 Dependence on renal dialysis: Secondary | ICD-10-CM | POA: Diagnosis not present

## 2022-11-04 DIAGNOSIS — T82898A Other specified complication of vascular prosthetic devices, implants and grafts, initial encounter: Secondary | ICD-10-CM | POA: Diagnosis not present

## 2022-11-04 DIAGNOSIS — N185 Chronic kidney disease, stage 5: Secondary | ICD-10-CM | POA: Diagnosis not present

## 2022-11-04 DIAGNOSIS — I120 Hypertensive chronic kidney disease with stage 5 chronic kidney disease or end stage renal disease: Secondary | ICD-10-CM | POA: Insufficient documentation

## 2022-11-04 DIAGNOSIS — N186 End stage renal disease: Secondary | ICD-10-CM | POA: Insufficient documentation

## 2022-11-04 DIAGNOSIS — F17210 Nicotine dependence, cigarettes, uncomplicated: Secondary | ICD-10-CM | POA: Insufficient documentation

## 2022-11-04 HISTORY — PX: A/V FISTULAGRAM: CATH118298

## 2022-11-04 LAB — POCT I-STAT, CHEM 8
BUN: 30 mg/dL — ABNORMAL HIGH (ref 8–23)
Calcium, Ion: 1.12 mmol/L — ABNORMAL LOW (ref 1.15–1.40)
Chloride: 103 mmol/L (ref 98–111)
Creatinine, Ser: 5.6 mg/dL — ABNORMAL HIGH (ref 0.61–1.24)
Glucose, Bld: 101 mg/dL — ABNORMAL HIGH (ref 70–99)
HCT: 34 % — ABNORMAL LOW (ref 39.0–52.0)
Hemoglobin: 11.6 g/dL — ABNORMAL LOW (ref 13.0–17.0)
Potassium: 4.6 mmol/L (ref 3.5–5.1)
Sodium: 139 mmol/L (ref 135–145)
TCO2: 25 mmol/L (ref 22–32)

## 2022-11-04 SURGERY — A/V FISTULAGRAM
Anesthesia: LOCAL | Laterality: Left

## 2022-11-04 MED ORDER — FENTANYL CITRATE (PF) 100 MCG/2ML IJ SOLN
INTRAMUSCULAR | Status: DC | PRN
  Administered 2022-11-04: 25 ug via INTRAVENOUS

## 2022-11-04 MED ORDER — HEPARIN (PORCINE) IN NACL 1000-0.9 UT/500ML-% IV SOLN
INTRAVENOUS | Status: DC | PRN
  Administered 2022-11-04 (×2): 500 mL

## 2022-11-04 MED ORDER — MIDAZOLAM HCL 2 MG/2ML IJ SOLN
INTRAMUSCULAR | Status: DC | PRN
  Administered 2022-11-04: 1 mg via INTRAVENOUS

## 2022-11-04 MED ORDER — SODIUM CHLORIDE 0.9% FLUSH: 3.0000 mL | INTRAVENOUS | Status: DC | PRN

## 2022-11-04 MED ORDER — LIDOCAINE HCL (PF) 1 % IJ SOLN
INTRAMUSCULAR | Status: AC
  Filled 2022-11-04: qty 30

## 2022-11-04 MED ORDER — LIDOCAINE HCL (PF) 1 % IJ SOLN
INTRAMUSCULAR | Status: DC | PRN
  Administered 2022-11-04: 3 mL

## 2022-11-04 MED ORDER — FENTANYL CITRATE (PF) 100 MCG/2ML IJ SOLN
INTRAMUSCULAR | Status: AC
  Filled 2022-11-04: qty 2

## 2022-11-04 MED ORDER — MIDAZOLAM HCL 2 MG/2ML IJ SOLN
INTRAMUSCULAR | Status: AC
  Filled 2022-11-04: qty 2

## 2022-11-04 SURGICAL SUPPLY — 8 items
COVER DOME SNAP 22 D (MISCELLANEOUS) ×1 IMPLANT
KIT MICROPUNCTURE NIT STIFF (SHEATH) IMPLANT
PROTECTION STATION PRESSURIZED (MISCELLANEOUS) ×1
SHEATH PROBE COVER 6X72 (BAG) ×1 IMPLANT
STATION PROTECTION PRESSURIZED (MISCELLANEOUS) ×1 IMPLANT
STOPCOCK MORSE 400PSI 3WAY (MISCELLANEOUS) ×1 IMPLANT
TRAY PV CATH (CUSTOM PROCEDURE TRAY) ×1 IMPLANT
TUBING CIL FLEX 10 FLL-RA (TUBING) ×1 IMPLANT

## 2022-11-04 NOTE — Progress Notes (Signed)
Patient and daughter was given discharge instructions. Both verbalized understanding. 

## 2022-11-04 NOTE — Op Note (Signed)
    Patient name: Matthew [name] MRN: [id number] DOB: 08/09/1945 Sex: male  11/04/2022 Pre-operative Diagnosis: ESRD Post-operative diagnosis:  Same Surgeon:  Durene Cal Procedure Performed:  1.  Ultrasound-guided access, left basilic vein fistula  2.  Fistulogram  3.  Conscious sedation, 12 minutes   Indications: This is a 77 year old gentleman recently status post left basilic vein fistula who comes in today for evaluation  Procedure:  The patient was identified in the holding area and taken to room 8.  The patient was then placed supine on the table and prepped and draped in the usual sterile fashion.  A time out was called.  Conscious sedation was administered with the use of IV fentanyl and Versed under continuous physician and nurse monitoring.  Heart rate, blood pressure, and oxygen saturations were continuously monitored.  Total sedation time was 12 minutes ultrasound was used to evaluate the fistula.  The vein was patent and compressible.  A digital ultrasound image was acquired.  The fistula was then accessed under ultrasound guidance using a micropuncture needle.  An 018 wire was then asvanced without resistance and a micropuncture sheath was placed.  Contrast injections were then performed through the sheath.  Findings: No evidence of central venous stenosis.  The arterial venous anastomosis is widely patent.  The fistula is widely patent in the upper arm.   Intervention: None  Impression:  #1  Widely patent left upper arm fistula without stenosis    V. Durene Cal, M.D., [facility] [place] Office: [phone] Pager:  [phone]

## 2022-11-04 NOTE — H&P (Signed)
Vascular and Vein Specialist of [facility]  Patient name: Matthew [name] MRN: [id number] DOB: 11-29-1945 Sex: male     HISOTRY OF PRESENT ILLNESS:    Matthew [name] is a 77 y.o. male with ESRD who is having difficulty with access at Dialysis. He has a second stage BVT by Dr. Arbie Cookey on 06-17-2022   PAST MEDICAL HISTORY:   Past Medical History:  Diagnosis Date   ACS (acute coronary syndrome) (HCC) 10/2018   Arthritis    Bladder stones    BPH (benign prostatic hyperplasia)    CAD (coronary artery disease)    a. Pt reports prior stenting @ [facility] - no records in Epic.   Chronic kidney disease (CKD), stage III (moderate) (HCC)    dx from [place] in [place], [place]   End stage chronic kidney disease (HCC)    Hyperlipidemia    Hypertension    Moderate to severe aortic stenosis    a. PER 01-15-16 [facility] [place].   Neuropathy    FEET AND HANDS   Peripheral vascular disease (HCC)    a. 04/2016 s/p L Fem->PT bypass; b. 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 04/2017 LLE Duplex: No restenosis. ABI R 0.63, ABI L 1.01.   Post traumatic stress disorder (PTSD)    Pre-diabetes    Sleep apnea    does not use cpap     FAMILY HISTORY:   Family History  Problem Relation Age of Onset   Leukemia Sister    Lung cancer Brother    Heart failure Mother        died @ 50   Diabetes Father        died @ 7   Heart attack Father     SOCIAL HISTORY:   Social History   Tobacco Use   Smoking status: Every Day    Packs/day: 0.50    Years: 50.00    Additional pack years: 0.00    Total pack years: 25.00    Types: Cigarettes    Start date: 06/15/1962   Smokeless tobacco: Never  Substance Use Topics   Alcohol use: Never     ALLERGIES:   Allergies  Allergen Reactions   Tramadol Hcl Itching     CURRENT MEDICATIONS:   Current Facility-Administered Medications  Medication Dose Route Frequency Provider Last Rate Last Admin   sodium  chloride flush (NS) 0.9 % injection 3 mL  3 mL Intravenous PRN Nada Libman, MD        REVIEW OF SYSTEMS:   [X]  denotes positive finding, [ ]  denotes negative finding Cardiac  Comments:  Chest pain or chest pressure:    Shortness of breath upon exertion:    Short of breath when lying flat:    Irregular heart rhythm:        Vascular    Pain in calf, thigh, or hip brought on by ambulation:    Pain in feet at night that wakes you up from your sleep:     Blood clot in your veins:    Leg swelling:         Pulmonary    Oxygen at home:    Productive cough:     Wheezing:         Neurologic    Sudden weakness in arms or legs:     Sudden numbness in arms or legs:     Sudden onset of difficulty speaking or slurred speech:    Temporary loss of vision in  one eye:     Problems with dizziness:         Gastrointestinal    Blood in stool:     Vomited blood:         Genitourinary    Burning when urinating:     Blood in urine:        Psychiatric    Major depression:         Hematologic    Bleeding problems:    Problems with blood clotting too easily:        Skin    Rashes or ulcers:        Constitutional    Fever or chills:      PHYSICAL EXAM:   Vitals:   11/04/22 0800  BP: 135/63  Pulse: 63  Temp: 98.3 F (36.8 C)  TempSrc: Oral  SpO2: 98%  Weight: 82.1 kg  Height: 5\' 8"  (1.727 m)    GENERAL: The patient is a well-nourished male, in no acute distress. The vital signs are documented above. CARDIAC: There is a regular rate and rhythm.  VASCULAR: thrill in fistula PULMONARY: Non-labored respirations ABDOMEN: Soft and non-tender with normal pitched bowel sounds.  MUSCULOSKELETAL: There are no major deformities or cyanosis. NEUROLOGIC: No focal weakness or paresthesias are detected. SKIN: There are no ulcers or rashes noted. PSYCHIATRIC: The patient has a normal affect.  STUDIES:   none  MEDICAL ISSUES:   Plan for fistulogram with intervention if  indicated    Charlena Cross, MD, FACS [facility] Vein Specialists of [facility] [phone] Pager [phone]

## 2022-11-05 ENCOUNTER — Encounter (HOSPITAL_COMMUNITY): Payer: Self-pay | Admitting: Surgery

## 2022-12-05 ENCOUNTER — Emergency Department (HOSPITAL_COMMUNITY)
Admission: EM | Admit: 2022-12-05 | Discharge: 2022-12-05 | Payer: Medicare Other | Attending: Emergency Medicine | Admitting: Emergency Medicine

## 2022-12-05 ENCOUNTER — Encounter (HOSPITAL_COMMUNITY): Payer: Self-pay | Admitting: *Deleted

## 2022-12-05 ENCOUNTER — Emergency Department (HOSPITAL_COMMUNITY): Payer: Medicare Other

## 2022-12-05 ENCOUNTER — Other Ambulatory Visit: Payer: Self-pay

## 2022-12-05 ENCOUNTER — Telehealth: Payer: Self-pay | Admitting: Orthopedic Surgery

## 2022-12-05 DIAGNOSIS — M79672 Pain in left foot: Secondary | ICD-10-CM | POA: Diagnosis present

## 2022-12-05 DIAGNOSIS — Z743 Need for continuous supervision: Secondary | ICD-10-CM | POA: Diagnosis not present

## 2022-12-05 DIAGNOSIS — Z89422 Acquired absence of other left toe(s): Secondary | ICD-10-CM | POA: Diagnosis not present

## 2022-12-05 DIAGNOSIS — M869 Osteomyelitis, unspecified: Secondary | ICD-10-CM | POA: Insufficient documentation

## 2022-12-05 DIAGNOSIS — M86172 Other acute osteomyelitis, left ankle and foot: Secondary | ICD-10-CM | POA: Diagnosis not present

## 2022-12-05 DIAGNOSIS — M79675 Pain in left toe(s): Secondary | ICD-10-CM | POA: Diagnosis not present

## 2022-12-05 DIAGNOSIS — N186 End stage renal disease: Secondary | ICD-10-CM | POA: Insufficient documentation

## 2022-12-05 DIAGNOSIS — Z992 Dependence on renal dialysis: Secondary | ICD-10-CM | POA: Diagnosis not present

## 2022-12-05 DIAGNOSIS — Z89412 Acquired absence of left great toe: Secondary | ICD-10-CM | POA: Diagnosis not present

## 2022-12-05 DIAGNOSIS — M79673 Pain in unspecified foot: Secondary | ICD-10-CM | POA: Diagnosis not present

## 2022-12-05 DIAGNOSIS — S91302A Unspecified open wound, left foot, initial encounter: Secondary | ICD-10-CM | POA: Diagnosis not present

## 2022-12-05 LAB — CBC WITH DIFFERENTIAL/PLATELET
Abs Immature Granulocytes: 0.01 10*3/uL (ref 0.00–0.07)
Basophils Absolute: 0 10*3/uL (ref 0.0–0.1)
Basophils Relative: 1 %
Eosinophils Absolute: 0.2 10*3/uL (ref 0.0–0.5)
Eosinophils Relative: 3 %
HCT: 33.6 % — ABNORMAL LOW (ref 39.0–52.0)
Hemoglobin: 10.4 g/dL — ABNORMAL LOW (ref 13.0–17.0)
Immature Granulocytes: 0 %
Lymphocytes Relative: 20 %
Lymphs Abs: 1.4 10*3/uL (ref 0.7–4.0)
MCH: 28.8 pg (ref 26.0–34.0)
MCHC: 31 g/dL (ref 30.0–36.0)
MCV: 93.1 fL (ref 80.0–100.0)
Monocytes Absolute: 0.9 10*3/uL (ref 0.1–1.0)
Monocytes Relative: 12 %
Neutro Abs: 4.5 10*3/uL (ref 1.7–7.7)
Neutrophils Relative %: 64 %
Platelets: 168 10*3/uL (ref 150–400)
RBC: 3.61 MIL/uL — ABNORMAL LOW (ref 4.22–5.81)
RDW: 15.6 % — ABNORMAL HIGH (ref 11.5–15.5)
WBC: 7 10*3/uL (ref 4.0–10.5)
nRBC: 0 % (ref 0.0–0.2)

## 2022-12-05 LAB — BASIC METABOLIC PANEL
Anion gap: 12 (ref 5–15)
BUN: 42 mg/dL — ABNORMAL HIGH (ref 8–23)
CO2: 26 mmol/L (ref 22–32)
Calcium: 8 mg/dL — ABNORMAL LOW (ref 8.9–10.3)
Chloride: 100 mmol/L (ref 98–111)
Creatinine, Ser: 5.94 mg/dL — ABNORMAL HIGH (ref 0.61–1.24)
GFR, Estimated: 9 mL/min — ABNORMAL LOW (ref >60)
Glucose, Bld: 108 mg/dL — ABNORMAL HIGH (ref 70–99)
Potassium: 4 mmol/L (ref 3.5–5.1)
Sodium: 138 mmol/L (ref 135–145)

## 2022-12-05 LAB — CULTURE, BLOOD (ROUTINE X 2)
Culture: NO GROWTH
Culture: NO GROWTH

## 2022-12-05 LAB — SEDIMENTATION RATE: Sed Rate: 70 mm/hr — ABNORMAL HIGH (ref 0–16)

## 2022-12-05 LAB — C-REACTIVE PROTEIN: CRP: 6.8 mg/dL — ABNORMAL HIGH (ref <1.0)

## 2022-12-05 MED ORDER — OXYCODONE-ACETAMINOPHEN 5-325 MG PO TABS
2.0000 | ORAL_TABLET | Freq: Once | ORAL | Status: AC
  Administered 2022-12-05: 2 via ORAL
  Filled 2022-12-05: qty 2

## 2022-12-05 MED ORDER — DOXYCYCLINE HYCLATE 100 MG PO CAPS: 100.0000 mg | ORAL_CAPSULE | Freq: Two times a day (BID) | ORAL | 0 refills | Status: DC

## 2022-12-05 NOTE — ED Notes (Signed)
Wrapped patients' left great toe and top of foot with petroleum gauze, then tefla non stick and wrap.ped securing with tape.

## 2022-12-05 NOTE — ED Notes (Signed)
Pt states medication is working well for pain at this time.

## 2022-12-05 NOTE — Discharge Instructions (Signed)
You were seen in the emergency department for pain of your left foot.  Your x-ray shows possible signs of bone infection.  Orthopedics Dr. Lajoyce Corners wanted you to start on antibiotics and follow-up with him in the office to discuss further options.

## 2022-12-05 NOTE — Telephone Encounter (Signed)
Called pt again, he did answer. Didn't know when would be a good time due to his daughter needing to bring him to appointments. I called his daughter at [phone], went straight to VM. Asked her to call back to schedule this Tuesday AM or PM.  Autumn can you please follow up on this next week? Thank you!!!

## 2022-12-05 NOTE — ED Provider Notes (Signed)
[place] EMERGENCY DEPARTMENT AT [facility] Provider Note   CSN: [id number] Arrival date & time: 12/05/22  1044     History  No chief complaint on file.   Matthew [name] is a 77 y.o. male.  He has a history of end-stage renal disease, dialysis Monday Wednesday Friday.  He is also had osteomyelitis of toe of left foot status post resection 2017.  He said he couldn't go to dialysis today because his left great toe area hurt too much to put any weight on it.  No fevers chills nausea vomiting.  No trauma.  The history is provided by the patient and the EMS personnel.  Foot Pain The problem occurs constantly. The problem has not changed since onset.Pertinent negatives include no chest pain, no abdominal pain, no headaches and no shortness of breath. The symptoms are aggravated by standing and walking. Nothing relieves the symptoms. He has tried rest for the symptoms. The treatment provided no relief.       Home Medications Prior to Admission medications   Medication Sig Start Date End Date Taking? Authorizing Provider  acetaminophen (TYLENOL) 325 MG tablet Take 2 tablets (650 mg total) by mouth every 6 (six) hours as needed for mild pain. Patient taking differently: Take 650 mg by mouth every 6 (six) hours as needed (pain.). 01/03/18   Sharlene Dory, PA-C  ascorbic acid (VITAMIN C) 500 MG tablet Take 500 mg by mouth in the morning. (0800)    [provider]  atorvastatin (LIPITOR) 80 MG tablet Take 1 tablet (80 mg total) by mouth daily at 6 PM. Patient taking differently: Take 80 mg by mouth at bedtime. (2000) 01/03/18   Sharlene Dory, PA-C  cholecalciferol (VITAMIN D3) 25 MCG (1000 UNIT) tablet Take 1,000 Units by mouth in the morning. (0800)    [provider]  famotidine (PEPCID) 20 MG tablet Take 20 mg by mouth every other day as needed for heartburn (GERD).    [provider]  febuxostat (ULORIC) 40 MG tablet Take 40 mg by mouth in the morning.  (0800)    [provider]  ferrous sulfate 324 MG TBEC Take 324 mg by mouth every Monday, Wednesday, and Friday. In the morning. (0800)    [provider]  gabapentin (NEURONTIN) 300 MG capsule Take 300 mg by mouth in the morning. (0800)    [provider]  methocarbamol (ROBAXIN) 750 MG tablet Take 750 mg by mouth at bedtime as needed for muscle spasms.    [provider]  metoprolol tartrate (LOPRESSOR) 100 MG tablet Take 100 mg by mouth 2 (two) times daily. (0800 & 2000)    [provider]  NIFEdipine (ADALAT CC) 60 MG 24 hr tablet Take 60 mg by mouth in the morning. (0800)    [provider]  Omega-3 Fatty Acids (OMEGA-3 FISH OIL PO) Take 1,000 mg by mouth in the morning and at bedtime. (0800 & 2000)    [provider]  oxyCODONE-acetaminophen (PERCOCET) 5-325 MG tablet Take 1 tablet by mouth every 6 (six) hours as needed for severe pain. 06/17/22   Larina Earthly, MD  Zinc 50 MG TABS Take 50 mg by mouth in the morning. (0800)    [provider]      Allergies    Tramadol hcl    Review of Systems   Review of Systems  Constitutional:  Negative for fever.  Respiratory:  Negative for shortness of breath.   Cardiovascular:  Negative for chest pain.  Gastrointestinal:  Negative for abdominal pain.  Skin:  Positive for wound.  Neurological:  Negative for headaches.    Physical Exam Updated Vital Signs BP (!) 117/52 (BP Location: Right Arm)   Pulse 69   Temp 98.5 F (36.9 C) (Oral)   Resp 16   Ht 5\' 8"  (1.727 m)   Wt 85.3 kg   SpO2 98%   BMI 28.59 kg/m  Physical Exam Vitals and nursing note reviewed.  Constitutional:      General: He is not in acute distress.    Appearance: Normal appearance. He is well-developed.  HENT:     Head: Normocephalic and atraumatic.  Eyes:     Conjunctiva/sclera: Conjunctivae normal.  Cardiovascular:     Rate and Rhythm: Normal rate and regular rhythm.     Heart sounds: No  murmur heard. Pulmonary:     Effort: Pulmonary effort is normal. No respiratory distress.     Breath sounds: Normal breath sounds.  Abdominal:     Palpations: Abdomen is soft.     Tenderness: There is no abdominal tenderness.  Musculoskeletal:        General: Tenderness and deformity present.     Cervical back: Neck supple.     Comments: Left foot, has amputation of great toe with a large ulcerated base.  No drainage.  Mild edema of the foot.  No crepitus.  No erythema or warmth.  Skin:    General: Skin is warm and dry.     Capillary Refill: Capillary refill takes less than 2 seconds.  Neurological:     General: No focal deficit present.     Mental Status: He is alert.     ED Results / Procedures / Treatments   Labs (all labs ordered are listed, but only abnormal results are displayed) Labs Reviewed  BASIC METABOLIC PANEL - Abnormal; Notable for the following components:      Result Value   Glucose, Bld 108 (*)    BUN 42 (*)    Creatinine, Ser 5.94 (*)    Calcium 8.0 (*)    GFR, Estimated 9 (*)    All other components within normal limits  CBC WITH DIFFERENTIAL/PLATELET - Abnormal; Notable for the following components:   RBC 3.61 (*)    Hemoglobin 10.4 (*)    HCT 33.6 (*)    RDW 15.6 (*)    All other components within normal limits  SEDIMENTATION RATE - Abnormal; Notable for the following components:   Sed Rate 70 (*)    All other components within normal limits  C-REACTIVE PROTEIN - Abnormal; Notable for the following components:   CRP 6.8 (*)    All other components within normal limits  CULTURE, BLOOD (ROUTINE X 2)  CULTURE, BLOOD (ROUTINE X 2)    EKG None  Radiology DG Foot Complete Left  Result Date: 12/05/2022 CLINICAL DATA:  Open wound left foot.  Great toe pain. EXAM: LEFT FOOT - COMPLETE 3+ VIEW COMPARISON:  08/07/2021 FINDINGS: Great toe amputation at the level of the MTP joint again noted. Apparent skin ulcer in this region with loss of cortical bone  along the medial aspect of the underlying metatarsal head. Patient is status post interval second toe amputation at the level of the MTP joint. Bones are diffusely demineralized. Loss of IMPRESSION: 1. Subtle loss of cortical bone along the medial first metatarsal head, concerning for osteomyelitis. 2. Status post first and second toe amputation at the level of the  MTP joints. Electronically Signed   By: Kennith Center M.D.   On: 12/05/2022 12:34    Procedures Procedures    Medications Ordered in ED Medications  oxyCODONE-acetaminophen (PERCOCET/ROXICET) 5-325 MG per tablet 2 tablet (2 tablets Oral Given 12/05/22 1148)    ED Course/ Medical Decision Making/ A&P Clinical Course as of 12/05/22 1707  Fri Dec 05, 2022  1328 Discussed with PA Magnant covering Dr. Lajoyce Corners from Ortho care.  He will review the case with Dr. Lajoyce Corners for recommendations. [MB]  1350 Per Dr. Lajoyce Corners the patient can be discharged on doxycycline and follow-up with him in the office to discuss further management. [MB]    Clinical Course User Index [MB] Terrilee Files, MD                                 Medical Decision Making Amount and/or Complexity of Data Reviewed Labs: ordered. Radiology: ordered.  Risk Prescription drug management.   This patient complains of pain in left foot; this involves an extensive number of treatment Options and is a complaint that carries with it a high risk of complications and morbidity. The differential includes cellulitis, osteomyelitis, fracture, foreign body, neuropathy  I ordered, reviewed and interpreted labs, which included CBC with normal white count stable low hemoglobin, chemistries consistent with his end-stage renal disease, inflammatory markers elevated, blood culture sent I ordered medication oral pain medication and reviewed PMP when indicated. I ordered imaging studies which included x-rays left foot and I independently    visualized and interpreted imaging which showed  bony erosion possible osteomyelitis Previous records obtained and reviewed in epic including vascular notes I consulted orthopedics Dr. Lajoyce Corners and discussed lab and imaging findings and discussed disposition.  Social determinants considered, patient with continued tobacco use Critical Interventions: None  After the interventions stated above, I reevaluated the patient and found patient to be nontoxic-appearing and pain adequately controlled Admission and further testing considered, orthopedics is recommending oral antibiotics and outpatient follow-up in clinic.  Patient is already on oral narcotics for chronic pain.  He is comfortable plan for discharge and antibiotics and follow-up outpatient with Dr. Lajoyce Corners.  Return instructions discussed         Final Clinical Impression(s) / ED Diagnoses Final diagnoses:  Osteomyelitis of left foot, unspecified type (HCC)    Rx / DC Orders ED Discharge Orders          Ordered    doxycycline (VIBRAMYCIN) 100 MG capsule  2 times daily        12/05/22 1355              Terrilee Files, MD 12/05/22 1710

## 2022-12-05 NOTE — Telephone Encounter (Signed)
Franky Macho is on practice call today and received information about this patient that needs to follow up next week with Dr. Lajoyce Corners for a non healing foot ulcer. I have called the patient and it rang, and no voicemail has been set up. I will try again later to try and reach him.

## 2022-12-05 NOTE — ED Triage Notes (Signed)
Pt BIB [facility] from [place] for left foot pain; pt has a weeping wound to left foot; pt c/o pain with walking and states he was unable to go to dialysis due to the pain

## 2022-12-08 NOTE — Telephone Encounter (Signed)
Can you please try reaching out to this pt? Call his daughter to sch appt see message below. Meeds to be this week with Dr. Lajoyce Corners let me know what time and I will make the appt. Thanks

## 2022-12-09 ENCOUNTER — Ambulatory Visit: Payer: Medicare Other | Admitting: Orthopedic Surgery

## 2022-12-09 ENCOUNTER — Encounter: Payer: Self-pay | Admitting: Orthopedic Surgery

## 2022-12-09 DIAGNOSIS — I96 Gangrene, not elsewhere classified: Secondary | ICD-10-CM | POA: Diagnosis not present

## 2022-12-09 DIAGNOSIS — I739 Peripheral vascular disease, unspecified: Secondary | ICD-10-CM
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
## 2022-12-12 ENCOUNTER — Telehealth: Payer: Self-pay

## 2022-12-12 NOTE — Telephone Encounter (Signed)
Transition Care Management Unsuccessful Follow-up Telephone Call  Date of discharge and from where:  [facility] Attempts:  1st Attempt  Reason for unsuccessful TCM follow-up call:  No answer/busy   Lenard Forth [facility] Guide, [facility] [phone] [address] [place], [place], [place] [address] Phone: [phone] Email: [email or web address]

## 2022-12-12 NOTE — Telephone Encounter (Signed)
Transition Care Management Unsuccessful Follow-up Telephone Call  Date of discharge and from where:  [facility]  Attempts:  2nd Attempt  Reason for unsuccessful TCM follow-up call:  No answer/busy   Lenard Forth [facility] Guide, [facility] [phone] [address] [place], [place], [place] [address] Phone: [phone] Email: [email or web address]

## 2023-01-30 DIAGNOSIS — E559 Vitamin D deficiency, unspecified: Secondary | ICD-10-CM | POA: Diagnosis not present

## 2023-01-30 DIAGNOSIS — S88112A Complete traumatic amputation at level between knee and ankle, left lower leg, initial encounter: Secondary | ICD-10-CM | POA: Diagnosis not present

## 2023-01-30 DIAGNOSIS — I10 Essential (primary) hypertension: Secondary | ICD-10-CM | POA: Diagnosis not present

## 2023-01-30 DIAGNOSIS — K219 Gastro-esophageal reflux disease without esophagitis: Secondary | ICD-10-CM | POA: Diagnosis not present

## 2023-01-30 DIAGNOSIS — M6281 Muscle weakness (generalized): Secondary | ICD-10-CM | POA: Diagnosis not present

## 2023-01-30 DIAGNOSIS — E782 Mixed hyperlipidemia: Secondary | ICD-10-CM | POA: Diagnosis not present

## 2023-01-30 DIAGNOSIS — K5909 Other constipation: Secondary | ICD-10-CM | POA: Diagnosis not present

## 2023-02-04 DIAGNOSIS — I10 Essential (primary) hypertension: Secondary | ICD-10-CM | POA: Diagnosis not present

## 2023-02-04 DIAGNOSIS — M6281 Muscle weakness (generalized): Secondary | ICD-10-CM | POA: Diagnosis not present

## 2023-02-04 DIAGNOSIS — K219 Gastro-esophageal reflux disease without esophagitis: Secondary | ICD-10-CM | POA: Diagnosis not present

## 2023-02-04 DIAGNOSIS — E782 Mixed hyperlipidemia: Secondary | ICD-10-CM | POA: Diagnosis not present

## 2023-02-04 DIAGNOSIS — S88112A Complete traumatic amputation at level between knee and ankle, left lower leg, initial encounter: Secondary | ICD-10-CM | POA: Diagnosis not present

## 2023-02-04 DIAGNOSIS — E559 Vitamin D deficiency, unspecified: Secondary | ICD-10-CM | POA: Diagnosis not present

## 2023-02-04 DIAGNOSIS — K5909 Other constipation: Secondary | ICD-10-CM | POA: Diagnosis not present

## 2023-02-05 DIAGNOSIS — S88112A Complete traumatic amputation at level between knee and ankle, left lower leg, initial encounter: Secondary | ICD-10-CM | POA: Diagnosis not present

## 2023-02-05 DIAGNOSIS — E782 Mixed hyperlipidemia: Secondary | ICD-10-CM | POA: Diagnosis not present

## 2023-02-05 DIAGNOSIS — I10 Essential (primary) hypertension: Secondary | ICD-10-CM | POA: Diagnosis not present

## 2023-02-05 DIAGNOSIS — I7090 Unspecified atherosclerosis: Secondary | ICD-10-CM | POA: Diagnosis not present

## 2023-02-05 DIAGNOSIS — E559 Vitamin D deficiency, unspecified: Secondary | ICD-10-CM | POA: Diagnosis not present

## 2023-02-06 DIAGNOSIS — I10 Essential (primary) hypertension: Secondary | ICD-10-CM | POA: Diagnosis not present

## 2023-02-06 DIAGNOSIS — E782 Mixed hyperlipidemia: Secondary | ICD-10-CM | POA: Diagnosis not present

## 2023-02-06 DIAGNOSIS — M6281 Muscle weakness (generalized): Secondary | ICD-10-CM | POA: Diagnosis not present

## 2023-02-06 DIAGNOSIS — S88112A Complete traumatic amputation at level between knee and ankle, left lower leg, initial encounter: Secondary | ICD-10-CM | POA: Diagnosis not present

## 2023-02-06 DIAGNOSIS — E559 Vitamin D deficiency, unspecified: Secondary | ICD-10-CM | POA: Diagnosis not present

## 2023-02-06 DIAGNOSIS — I7090 Unspecified atherosclerosis: Secondary | ICD-10-CM | POA: Diagnosis not present

## 2023-02-11 DIAGNOSIS — I7090 Unspecified atherosclerosis: Secondary | ICD-10-CM | POA: Diagnosis not present

## 2023-02-11 DIAGNOSIS — S88112A Complete traumatic amputation at level between knee and ankle, left lower leg, initial encounter: Secondary | ICD-10-CM | POA: Diagnosis not present

## 2023-02-11 DIAGNOSIS — K219 Gastro-esophageal reflux disease without esophagitis: Secondary | ICD-10-CM | POA: Diagnosis not present

## 2023-02-11 DIAGNOSIS — E559 Vitamin D deficiency, unspecified: Secondary | ICD-10-CM | POA: Diagnosis not present

## 2023-02-11 DIAGNOSIS — M6281 Muscle weakness (generalized): Secondary | ICD-10-CM | POA: Diagnosis not present

## 2023-02-11 DIAGNOSIS — E782 Mixed hyperlipidemia: Secondary | ICD-10-CM | POA: Diagnosis not present

## 2023-02-11 DIAGNOSIS — I10 Essential (primary) hypertension: Secondary | ICD-10-CM | POA: Diagnosis not present

## 2023-02-13 DIAGNOSIS — M6281 Muscle weakness (generalized): Secondary | ICD-10-CM | POA: Diagnosis not present

## 2023-02-13 DIAGNOSIS — K219 Gastro-esophageal reflux disease without esophagitis: Secondary | ICD-10-CM | POA: Diagnosis not present

## 2023-02-13 DIAGNOSIS — E559 Vitamin D deficiency, unspecified: Secondary | ICD-10-CM | POA: Diagnosis not present

## 2023-02-13 DIAGNOSIS — E782 Mixed hyperlipidemia: Secondary | ICD-10-CM | POA: Diagnosis not present

## 2023-02-13 DIAGNOSIS — I7090 Unspecified atherosclerosis: Secondary | ICD-10-CM | POA: Diagnosis not present

## 2023-02-13 DIAGNOSIS — I10 Essential (primary) hypertension: Secondary | ICD-10-CM | POA: Diagnosis not present

## 2023-02-13 DIAGNOSIS — S88112A Complete traumatic amputation at level between knee and ankle, left lower leg, initial encounter: Secondary | ICD-10-CM | POA: Diagnosis not present

## 2023-02-16 DIAGNOSIS — I7090 Unspecified atherosclerosis: Secondary | ICD-10-CM | POA: Diagnosis not present

## 2023-02-16 DIAGNOSIS — I10 Essential (primary) hypertension: Secondary | ICD-10-CM | POA: Diagnosis not present

## 2023-02-16 DIAGNOSIS — E559 Vitamin D deficiency, unspecified: Secondary | ICD-10-CM | POA: Diagnosis not present

## 2023-02-16 DIAGNOSIS — M6281 Muscle weakness (generalized): Secondary | ICD-10-CM | POA: Diagnosis not present

## 2023-02-16 DIAGNOSIS — E782 Mixed hyperlipidemia: Secondary | ICD-10-CM | POA: Diagnosis not present

## 2023-02-16 DIAGNOSIS — K219 Gastro-esophageal reflux disease without esophagitis: Secondary | ICD-10-CM | POA: Diagnosis not present

## 2023-02-18 DIAGNOSIS — I10 Essential (primary) hypertension: Secondary | ICD-10-CM | POA: Diagnosis not present

## 2023-02-18 DIAGNOSIS — I7090 Unspecified atherosclerosis: Secondary | ICD-10-CM | POA: Diagnosis not present

## 2023-02-18 DIAGNOSIS — K219 Gastro-esophageal reflux disease without esophagitis: Secondary | ICD-10-CM | POA: Diagnosis not present

## 2023-02-18 DIAGNOSIS — E559 Vitamin D deficiency, unspecified: Secondary | ICD-10-CM | POA: Diagnosis not present

## 2023-02-18 DIAGNOSIS — E782 Mixed hyperlipidemia: Secondary | ICD-10-CM | POA: Diagnosis not present

## 2023-02-18 DIAGNOSIS — M6281 Muscle weakness (generalized): Secondary | ICD-10-CM | POA: Diagnosis not present

## 2023-02-20 DIAGNOSIS — I10 Essential (primary) hypertension: Secondary | ICD-10-CM | POA: Diagnosis not present

## 2023-02-20 DIAGNOSIS — K219 Gastro-esophageal reflux disease without esophagitis: Secondary | ICD-10-CM | POA: Diagnosis not present

## 2023-02-20 DIAGNOSIS — M6281 Muscle weakness (generalized): Secondary | ICD-10-CM | POA: Diagnosis not present

## 2023-02-20 DIAGNOSIS — E782 Mixed hyperlipidemia: Secondary | ICD-10-CM | POA: Diagnosis not present

## 2023-02-20 DIAGNOSIS — E559 Vitamin D deficiency, unspecified: Secondary | ICD-10-CM | POA: Diagnosis not present

## 2023-02-20 DIAGNOSIS — I7090 Unspecified atherosclerosis: Secondary | ICD-10-CM | POA: Diagnosis not present

## 2023-02-23 DIAGNOSIS — M6281 Muscle weakness (generalized): Secondary | ICD-10-CM | POA: Diagnosis not present

## 2023-02-23 DIAGNOSIS — E782 Mixed hyperlipidemia: Secondary | ICD-10-CM | POA: Diagnosis not present

## 2023-02-23 DIAGNOSIS — I10 Essential (primary) hypertension: Secondary | ICD-10-CM | POA: Diagnosis not present

## 2023-02-23 DIAGNOSIS — I7090 Unspecified atherosclerosis: Secondary | ICD-10-CM | POA: Diagnosis not present

## 2023-02-23 DIAGNOSIS — E559 Vitamin D deficiency, unspecified: Secondary | ICD-10-CM | POA: Diagnosis not present

## 2023-02-23 DIAGNOSIS — K219 Gastro-esophageal reflux disease without esophagitis: Secondary | ICD-10-CM | POA: Diagnosis not present

## 2023-02-25 DIAGNOSIS — E782 Mixed hyperlipidemia: Secondary | ICD-10-CM | POA: Diagnosis not present

## 2023-02-25 DIAGNOSIS — I10 Essential (primary) hypertension: Secondary | ICD-10-CM | POA: Diagnosis not present

## 2023-02-25 DIAGNOSIS — K219 Gastro-esophageal reflux disease without esophagitis: Secondary | ICD-10-CM | POA: Diagnosis not present

## 2023-02-25 DIAGNOSIS — M6281 Muscle weakness (generalized): Secondary | ICD-10-CM | POA: Diagnosis not present

## 2023-02-25 DIAGNOSIS — E559 Vitamin D deficiency, unspecified: Secondary | ICD-10-CM | POA: Diagnosis not present

## 2023-02-25 DIAGNOSIS — I7090 Unspecified atherosclerosis: Secondary | ICD-10-CM | POA: Diagnosis not present

## 2023-02-27 DIAGNOSIS — M6281 Muscle weakness (generalized): Secondary | ICD-10-CM | POA: Diagnosis not present

## 2023-02-27 DIAGNOSIS — E782 Mixed hyperlipidemia: Secondary | ICD-10-CM | POA: Diagnosis not present

## 2023-02-27 DIAGNOSIS — K219 Gastro-esophageal reflux disease without esophagitis: Secondary | ICD-10-CM | POA: Diagnosis not present

## 2023-02-27 DIAGNOSIS — E559 Vitamin D deficiency, unspecified: Secondary | ICD-10-CM | POA: Diagnosis not present

## 2023-02-27 DIAGNOSIS — I10 Essential (primary) hypertension: Secondary | ICD-10-CM | POA: Diagnosis not present

## 2023-02-27 DIAGNOSIS — S88112A Complete traumatic amputation at level between knee and ankle, left lower leg, initial encounter: Secondary | ICD-10-CM | POA: Diagnosis not present

## 2023-02-27 DIAGNOSIS — I7090 Unspecified atherosclerosis: Secondary | ICD-10-CM | POA: Diagnosis not present

## 2023-11-23 ENCOUNTER — Encounter (HOSPITAL_COMMUNITY): Payer: Self-pay

## 2023-11-23 ENCOUNTER — Other Ambulatory Visit: Payer: Self-pay

## 2023-11-23 ENCOUNTER — Emergency Department (HOSPITAL_COMMUNITY)

## 2023-11-23 ENCOUNTER — Emergency Department (HOSPITAL_COMMUNITY)
Admission: EM | Admit: 2023-11-23 | Discharge: 2023-11-23 | Disposition: A | Discharge status: Home / Self Care | Attending: Emergency Medicine | Admitting: Emergency Medicine

## 2023-11-23 DIAGNOSIS — M25551 Pain in right hip: Secondary | ICD-10-CM | POA: Diagnosis not present

## 2023-11-23 DIAGNOSIS — F17210 Nicotine dependence, cigarettes, uncomplicated: Secondary | ICD-10-CM | POA: Insufficient documentation

## 2023-11-23 DIAGNOSIS — I2510 Atherosclerotic heart disease of native coronary artery without angina pectoris: Secondary | ICD-10-CM | POA: Insufficient documentation

## 2023-11-23 DIAGNOSIS — Z0389 Encounter for observation for other suspected diseases and conditions ruled out: Secondary | ICD-10-CM | POA: Diagnosis not present

## 2023-11-23 DIAGNOSIS — R7303 Prediabetes: Secondary | ICD-10-CM | POA: Insufficient documentation

## 2023-11-23 DIAGNOSIS — M1990 Unspecified osteoarthritis, unspecified site: Secondary | ICD-10-CM

## 2023-11-23 DIAGNOSIS — I129 Hypertensive chronic kidney disease with stage 1 through stage 4 chronic kidney disease, or unspecified chronic kidney disease: Secondary | ICD-10-CM | POA: Insufficient documentation

## 2023-11-23 DIAGNOSIS — M7989 Other specified soft tissue disorders: Secondary | ICD-10-CM | POA: Diagnosis not present

## 2023-11-23 DIAGNOSIS — N183 Chronic kidney disease, stage 3 unspecified: Secondary | ICD-10-CM | POA: Insufficient documentation

## 2023-11-23 DIAGNOSIS — M1611 Unilateral primary osteoarthritis, right hip: Secondary | ICD-10-CM | POA: Diagnosis not present

## 2023-11-23 DIAGNOSIS — M79604 Pain in right leg: Secondary | ICD-10-CM | POA: Diagnosis not present

## 2023-11-23 DIAGNOSIS — R6889 Other general symptoms and signs: Secondary | ICD-10-CM | POA: Diagnosis not present

## 2023-11-23 DIAGNOSIS — M25572 Pain in left ankle and joints of left foot: Secondary | ICD-10-CM | POA: Diagnosis not present

## 2023-11-23 DIAGNOSIS — M1711 Unilateral primary osteoarthritis, right knee: Secondary | ICD-10-CM | POA: Diagnosis not present

## 2023-11-23 MED ORDER — HYDROMORPHONE HCL 1 MG/ML IJ SOLN
1.0000 mg | Freq: Once | INTRAMUSCULAR | Status: AC
  Administered 2023-11-23: 1 mg via INTRAMUSCULAR
  Filled 2023-11-23: qty 1

## 2023-11-23 MED ORDER — OXYCODONE HCL 5 MG PO TABS
5.0000 mg | ORAL_TABLET | Freq: Once | ORAL | Status: AC
  Administered 2023-11-23: 5 mg via ORAL
  Filled 2023-11-23: qty 1
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
## 2023-11-23 NOTE — ED Provider Notes (Addendum)
  Physical Exam  BP (!) 197/66 (BP Location: Right Arm)   Pulse (!) 58   Temp 97.6 F (36.4 C) (Oral)   Resp 16   Ht 5' 8 (1.727 m)   Wt 85.3 kg   SpO2 95%   BMI 28.59 kg/m   Physical Exam  Procedures  Procedures  ED Course / MDM    Medical Decision Making Amount and/or Complexity of Data Reviewed Radiology: ordered.  Risk Prescription drug management.   I have assumed care of this patient from the night team.  Patient had come in with chief complaint of leg pain.  X-ray of the hip indicated some cortical changes that mandated x-ray of the femur.  Dr. Theadore thinks that the pain is related to arthritis.  X-ray of the femur ordered to rule out any lytic lesions or pathologic fractures.  X-ray is negative.  Results of the ED workup discussed with the patient.  Stable for discharge with PCP follow-up.       Charlyn Sora, MD 11/23/23 0843   11:50 AM When I went over patient's results, he was still complaining of pain.  Patient has warm extremity, we checked dopplerable pulses and they are normal.  Ultrasound DVT ordered it is negative.  Patient is stable for discharge at this time. He gets his care at the [place].  I recommended close follow-up with his PCP.   Charlyn Sora, MD 11/23/23 1151

## 2024-02-22 ENCOUNTER — Emergency Department (HOSPITAL_COMMUNITY)
Admission: EM | Admit: 2024-02-22 | Discharge: 2024-02-22 | Disposition: A | Discharge status: Skilled Nursing Facility | Attending: Emergency Medicine | Admitting: Emergency Medicine

## 2024-02-22 ENCOUNTER — Other Ambulatory Visit: Payer: Self-pay

## 2024-02-22 ENCOUNTER — Encounter (HOSPITAL_COMMUNITY): Payer: Self-pay

## 2024-02-22 DIAGNOSIS — H6123 Impacted cerumen, bilateral: Secondary | ICD-10-CM | POA: Diagnosis not present

## 2024-02-22 DIAGNOSIS — Z91158 Patient's noncompliance with renal dialysis for other reason: Secondary | ICD-10-CM | POA: Insufficient documentation

## 2024-02-22 DIAGNOSIS — I2510 Atherosclerotic heart disease of native coronary artery without angina pectoris: Secondary | ICD-10-CM | POA: Diagnosis not present

## 2024-02-22 DIAGNOSIS — I5031 Acute diastolic (congestive) heart failure: Secondary | ICD-10-CM | POA: Diagnosis not present

## 2024-02-22 DIAGNOSIS — F17210 Nicotine dependence, cigarettes, uncomplicated: Secondary | ICD-10-CM | POA: Diagnosis not present

## 2024-02-22 DIAGNOSIS — E875 Hyperkalemia: Secondary | ICD-10-CM | POA: Diagnosis not present

## 2024-02-22 DIAGNOSIS — Z79899 Other long term (current) drug therapy: Secondary | ICD-10-CM | POA: Insufficient documentation

## 2024-02-22 DIAGNOSIS — N186 End stage renal disease: Secondary | ICD-10-CM | POA: Insufficient documentation

## 2024-02-22 DIAGNOSIS — I132 Hypertensive heart and chronic kidney disease with heart failure and with stage 5 chronic kidney disease, or end stage renal disease: Secondary | ICD-10-CM | POA: Diagnosis not present

## 2024-02-22 DIAGNOSIS — Z951 Presence of aortocoronary bypass graft: Secondary | ICD-10-CM | POA: Insufficient documentation

## 2024-02-22 DIAGNOSIS — H938X3 Other specified disorders of ear, bilateral: Secondary | ICD-10-CM | POA: Diagnosis present

## 2024-02-22 LAB — RENAL FUNCTION PANEL
Albumin: 4.3 g/dL (ref 3.5–5.0)
Anion gap: 14 (ref 5–15)
BUN: 53 mg/dL — ABNORMAL HIGH (ref 8–23)
CO2: 23 mmol/L (ref 22–32)
Calcium: 9.6 mg/dL (ref 8.9–10.3)
Chloride: 101 mmol/L (ref 98–111)
Creatinine, Ser: 12.1 mg/dL — ABNORMAL HIGH (ref 0.61–1.24)
GFR, Estimated: 4 mL/min — ABNORMAL LOW (ref >60)
Glucose, Bld: 88 mg/dL (ref 70–99)
Phosphorus: 3.7 mg/dL (ref 2.5–4.6)
Potassium: 6 mmol/L — ABNORMAL HIGH (ref 3.5–5.1)
Sodium: 138 mmol/L (ref 135–145)

## 2024-02-22 LAB — COMPREHENSIVE METABOLIC PANEL WITH GFR
ALT: 9 U/L (ref 0–44)
AST: 14 U/L — ABNORMAL LOW (ref 15–41)
Albumin: 4.3 g/dL (ref 3.5–5.0)
Alkaline Phosphatase: 65 U/L (ref 38–126)
Anion gap: 14 (ref 5–15)
BUN: 54 mg/dL — ABNORMAL HIGH (ref 8–23)
CO2: 24 mmol/L (ref 22–32)
Calcium: 9.7 mg/dL (ref 8.9–10.3)
Chloride: 102 mmol/L (ref 98–111)
Creatinine, Ser: 12.3 mg/dL — ABNORMAL HIGH (ref 0.61–1.24)
GFR, Estimated: 4 mL/min — ABNORMAL LOW (ref >60)
Glucose, Bld: 86 mg/dL (ref 70–99)
Potassium: 6 mmol/L — ABNORMAL HIGH (ref 3.5–5.1)
Sodium: 140 mmol/L (ref 135–145)
Total Bilirubin: 0.3 mg/dL (ref 0.0–1.2)
Total Protein: 7.4 g/dL (ref 6.5–8.1)

## 2024-02-22 LAB — CBC WITH DIFFERENTIAL/PLATELET
Abs Immature Granulocytes: 0.02 K/uL (ref 0.00–0.07)
Basophils Absolute: 0.1 K/uL (ref 0.0–0.1)
Basophils Relative: 1 %
Eosinophils Absolute: 0.3 K/uL (ref 0.0–0.5)
Eosinophils Relative: 5 %
HCT: 35 % — ABNORMAL LOW (ref 39.0–52.0)
Hemoglobin: 11 g/dL — ABNORMAL LOW (ref 13.0–17.0)
Immature Granulocytes: 0 %
Lymphocytes Relative: 20 %
Lymphs Abs: 1.3 K/uL (ref 0.7–4.0)
MCH: 31.9 pg (ref 26.0–34.0)
MCHC: 31.4 g/dL (ref 30.0–36.0)
MCV: 101.4 fL — ABNORMAL HIGH (ref 80.0–100.0)
Monocytes Absolute: 0.7 K/uL (ref 0.1–1.0)
Monocytes Relative: 10 %
Neutro Abs: 4 K/uL (ref 1.7–7.7)
Neutrophils Relative %: 64 %
Platelets: 179 K/uL (ref 150–400)
RBC: 3.45 MIL/uL — ABNORMAL LOW (ref 4.22–5.81)
RDW: 13.3 % (ref 11.5–15.5)
WBC: 6.4 K/uL (ref 4.0–10.5)
nRBC: 0 % (ref 0.0–0.2)

## 2024-02-22 LAB — CBC
HCT: 36.2 % — ABNORMAL LOW (ref 39.0–52.0)
Hemoglobin: 11.4 g/dL — ABNORMAL LOW (ref 13.0–17.0)
MCH: 31.8 pg (ref 26.0–34.0)
MCHC: 31.5 g/dL (ref 30.0–36.0)
MCV: 100.8 fL — ABNORMAL HIGH (ref 80.0–100.0)
Platelets: 185 K/uL (ref 150–400)
RBC: 3.59 MIL/uL — ABNORMAL LOW (ref 4.22–5.81)
RDW: 13.4 % (ref 11.5–15.5)
WBC: 6.4 K/uL (ref 4.0–10.5)
nRBC: 0 % (ref 0.0–0.2)

## 2024-02-22 LAB — HEPATITIS B SURFACE ANTIGEN: Hepatitis B Surface Ag: NONREACTIVE

## 2024-02-22 MED ORDER — SODIUM ZIRCONIUM CYCLOSILICATE 5 G PO PACK
10.0000 g | PACK | Freq: Once | ORAL | Status: AC
  Administered 2024-02-22: 10 g via ORAL
  Filled 2024-02-22: qty 2

## 2024-02-22 MED ORDER — NEPRO/CARBSTEADY PO LIQD: 237.0000 mL | ORAL | Status: DC | PRN

## 2024-02-22 MED ORDER — CHLORHEXIDINE GLUCONATE CLOTH 2 % EX PADS: 6.0000 | MEDICATED_PAD | Freq: Every day | CUTANEOUS | Status: DC

## 2024-02-22 MED ORDER — ANTICOAGULANT SODIUM CITRATE 4% (200MG/5ML) IV SOLN: 5.0000 mL | Status: DC | PRN

## 2024-02-22 MED ORDER — LIDOCAINE HCL (PF) 1 % IJ SOLN: 5.0000 mL | INTRAMUSCULAR | Status: DC | PRN

## 2024-02-22 MED ORDER — PENTAFLUOROPROP-TETRAFLUOROETH EX AERO
1.0000 | INHALATION_SPRAY | CUTANEOUS | Status: DC | PRN
  Administered 2024-02-22: 1 via TOPICAL

## 2024-02-22 MED ORDER — ALTEPLASE 2 MG IJ SOLR: 2.0000 mg | Freq: Once | INTRAMUSCULAR | Status: DC | PRN

## 2024-02-22 MED ORDER — HEPARIN SODIUM (PORCINE) 1000 UNIT/ML DIALYSIS: 1000.0000 [IU] | INTRAMUSCULAR | Status: DC | PRN

## 2024-02-22 MED ORDER — CARBAMIDE PEROXIDE 6.5 % OT SOLN: 5.0000 [drp] | Freq: Two times a day (BID) | OTIC | 0 refills | Status: DC

## 2024-02-22 MED ORDER — LIDOCAINE-PRILOCAINE 2.5-2.5 % EX CREA: 1.0000 | TOPICAL_CREAM | CUTANEOUS | Status: DC | PRN

## 2024-02-22 NOTE — ED Notes (Signed)
 Dialysis nurse says she will call pt family as they had called earlier asking about dispo- will inform family pt is ready to be picked up
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
## 2024-02-22 NOTE — Procedures (Signed)
 Received patient in bed to unit.  Alert and oriented.  Informed consent signed and in chart.  All procedures explained. LUA AVF cannulated x 2 with 15g needles per policy. Tx initiated per MD order. UF goal 2000 ml.  TX duration: 2.5 hours  Tx complete. Blood returned. Needles removed, sites held x 2 until hemostasis achieved. Gauze changed prior to taping.  Patient tolerated well.  Transported back to the room  Alert, without acute distress.  Hand-off given to patient's nurse.   Access used: LUA AVF Access issues: none  Total UF removed: 2000 ml Medication(s) given: See MAR   [facility] LITTIE [facility]

## 2024-02-22 NOTE — Discharge Instructions (Addendum)
 We evaluated you for your earwax buildup.  We were able to irrigate your ears and your symptoms have improved.  We have prescribed you a eardrop that you can use to help prevent earwax buildup.  Please make sure that you are going to your dialysis sessions as scheduled by your kidney doctor.  If you do not go to your dialysis sessions, this can cause life-threatening problems like difficulty breathing, problems with your electrolytes, and even death.

## 2024-02-22 NOTE — ED Notes (Signed)
 Irrigated ears

## 2024-02-22 NOTE — ED Triage Notes (Signed)
 Patient BIB [facility], Patient from [facility].  Called out for ringing in ears and possible wax build up that has had for a few days and caused hard of hearing in both ears. Vitals BP 193/83, Pulse 63, 97% room air.

## 2024-02-22 NOTE — Progress Notes (Signed)
 Called by Dr. Francesca of the ED about Matthew [name] who presented to the ED for ear wax buildup.  He has a h/o ESRD but has missed his last 2 HD sessions and is normally on MWF schedule.  In the ED, Bp 186/72, HR 62, SpO2 93%.  Labs notable for K of 6, BUN 54, Cr 12.3.  We were asked to provide dialysis to help treat his hyperkalemia and HTN.  He will have a short session here and then go back to the ED for discharge after the procedure.  He will need to follow up with his outpatient unit on Wednesday and not to miss anymore sessions.

## 2024-02-23 LAB — HEPATITIS B SURFACE ANTIBODY, QUANTITATIVE: Hep B S AB Quant (Post): 17955 m[IU]/mL

## 2024-04-26 ENCOUNTER — Other Ambulatory Visit: Payer: Self-pay

## 2024-04-26 ENCOUNTER — Emergency Department (HOSPITAL_COMMUNITY)

## 2024-04-26 ENCOUNTER — Emergency Department (HOSPITAL_COMMUNITY)
Admission: EM | Admit: 2024-04-26 | Discharge: 2024-04-26 | Disposition: A | Discharge status: Home / Self Care | Attending: Emergency Medicine | Admitting: Emergency Medicine

## 2024-04-26 ENCOUNTER — Encounter (HOSPITAL_COMMUNITY): Payer: Self-pay | Admitting: Emergency Medicine

## 2024-04-26 DIAGNOSIS — N186 End stage renal disease: Secondary | ICD-10-CM | POA: Diagnosis not present

## 2024-04-26 DIAGNOSIS — R0602 Shortness of breath: Secondary | ICD-10-CM | POA: Diagnosis present

## 2024-04-26 DIAGNOSIS — I120 Hypertensive chronic kidney disease with stage 5 chronic kidney disease or end stage renal disease: Secondary | ICD-10-CM | POA: Insufficient documentation

## 2024-04-26 DIAGNOSIS — Z992 Dependence on renal dialysis: Secondary | ICD-10-CM | POA: Diagnosis not present

## 2024-04-26 DIAGNOSIS — Z79899 Other long term (current) drug therapy: Secondary | ICD-10-CM | POA: Insufficient documentation

## 2024-04-26 DIAGNOSIS — I10 Essential (primary) hypertension: Secondary | ICD-10-CM

## 2024-04-26 LAB — CBC WITH DIFFERENTIAL/PLATELET
Abs Immature Granulocytes: 0.05 K/uL (ref 0.00–0.07)
Basophils Absolute: 0.1 K/uL (ref 0.0–0.1)
Basophils Relative: 1 %
Eosinophils Absolute: 0.5 K/uL (ref 0.0–0.5)
Eosinophils Relative: 7 %
HCT: 31.9 % — ABNORMAL LOW (ref 39.0–52.0)
Hemoglobin: 10.3 g/dL — ABNORMAL LOW (ref 13.0–17.0)
Immature Granulocytes: 1 %
Lymphocytes Relative: 19 %
Lymphs Abs: 1.3 K/uL (ref 0.7–4.0)
MCH: 31.1 pg (ref 26.0–34.0)
MCHC: 32.3 g/dL (ref 30.0–36.0)
MCV: 96.4 fL (ref 80.0–100.0)
Monocytes Absolute: 0.7 K/uL (ref 0.1–1.0)
Monocytes Relative: 11 %
Neutro Abs: 4.4 K/uL (ref 1.7–7.7)
Neutrophils Relative %: 61 %
Platelets: 174 K/uL (ref 150–400)
RBC: 3.31 MIL/uL — ABNORMAL LOW (ref 4.22–5.81)
RDW: 14.7 % (ref 11.5–15.5)
WBC: 7 K/uL (ref 4.0–10.5)
nRBC: 0 % (ref 0.0–0.2)

## 2024-04-26 LAB — BASIC METABOLIC PANEL WITH GFR
Anion gap: 17 — ABNORMAL HIGH (ref 5–15)
BUN: 40 mg/dL — ABNORMAL HIGH (ref 8–23)
CO2: 25 mmol/L (ref 22–32)
Calcium: 9.2 mg/dL (ref 8.9–10.3)
Chloride: 98 mmol/L (ref 98–111)
Creatinine, Ser: 9.6 mg/dL — ABNORMAL HIGH (ref 0.61–1.24)
GFR, Estimated: 5 mL/min — ABNORMAL LOW
Glucose, Bld: 106 mg/dL — ABNORMAL HIGH (ref 70–99)
Potassium: 4.6 mmol/L (ref 3.5–5.1)
Sodium: 139 mmol/L (ref 135–145)

## 2024-04-26 LAB — RESP PANEL BY RT-PCR (RSV, FLU A&B, COVID)  RVPGX2
Influenza A by PCR: NEGATIVE
Influenza B by PCR: NEGATIVE
Resp Syncytial Virus by PCR: NEGATIVE
SARS Coronavirus 2 by RT PCR: NEGATIVE

## 2024-04-26 MED ORDER — METOPROLOL TARTRATE 50 MG PO TABS
100.0000 mg | ORAL_TABLET | Freq: Once | ORAL | Status: AC
  Administered 2024-04-26: 100 mg via ORAL
  Filled 2024-04-26: qty 2

## 2024-04-26 MED ORDER — NIFEDIPINE ER OSMOTIC RELEASE 30 MG PO TB24
60.0000 mg | ORAL_TABLET | Freq: Once | ORAL | Status: AC
  Administered 2024-04-26: 60 mg via ORAL
  Filled 2024-04-26: qty 2

## 2024-04-26 NOTE — ED Provider Notes (Signed)
 "  [place] EMERGENCY DEPARTMENT AT [facility]  Provider Note  CSN: [id number] Arrival date & time: 04/26/24 0236  History Chief Complaint  Patient presents with   Shortness of Breath    Matthew [name] is a 78 y.o. male with history of ESRD on HD MWF had normal dialysis 12/22 brought to the ED via EMS from SNF for SOB onset a short time prior to arrival. Denies cough, but had low grade fever at facility. No CP. Does not wear O2.    Home Medications Prior to Admission medications  Medication Sig Start Date End Date Taking? Authorizing Provider  acetaminophen  (TYLENOL ) 325 MG tablet Take 2 tablets (650 mg total) by mouth every 6 (six) hours as needed for mild pain. Patient taking differently: Take 650 mg by mouth every 6 (six) hours as needed (pain.). 01/03/18   Lucien Orren SAILOR, PA-C  ascorbic acid (VITAMIN C) 500 MG tablet Take 500 mg by mouth in the morning. (0800)    [provider]  atorvastatin  (LIPITOR ) 80 MG tablet Take 1 tablet (80 mg total) by mouth daily at 6 PM. Patient taking differently: Take 80 mg by mouth at bedtime. (2000) 01/03/18   Lucien Orren SAILOR, PA-C  carbamide peroxide (DEBROX) 6.5 % OTIC solution Place 5 drops into both ears 2 (two) times daily. 02/22/24   Francesca Elsie LITTIE, MD  cholecalciferol (VITAMIN D3) 25 MCG (1000 UNIT) tablet Take 1,000 Units by mouth in the morning. (0800)    [provider]  doxycycline  (VIBRAMYCIN ) 100 MG capsule Take 1 capsule (100 mg total) by mouth 2 (two) times daily. 12/05/22   Towana Ozell BROCKS, MD  famotidine  (PEPCID ) 20 MG tablet Take 20 mg by mouth every other day as needed for heartburn (GERD).    [provider]  febuxostat  (ULORIC ) 40 MG tablet Take 40 mg by mouth in the morning. (0800)    [provider]  ferrous sulfate  324 MG TBEC Take 324 mg by mouth every Monday, Wednesday, and Friday. In the morning. (0800)    [provider]  gabapentin  (NEURONTIN ) 300 MG capsule Take  300 mg by mouth in the morning. (0800)    [provider]  methocarbamol  (ROBAXIN ) 750 MG tablet Take 750 mg by mouth at bedtime as needed for muscle spasms.    [provider]  metoprolol  tartrate (LOPRESSOR ) 100 MG tablet Take 100 mg by mouth 2 (two) times daily. (0800 & 2000)    [provider]  NIFEdipine  (ADALAT  CC) 60 MG 24 hr tablet Take 60 mg by mouth in the morning. (0800)    [provider]  Omega-3 Fatty Acids (OMEGA-3 FISH OIL PO) Take 1,000 mg by mouth in the morning and at bedtime. (0800 & 2000)    [provider]  oxyCODONE -acetaminophen  (PERCOCET) 5-325 MG tablet Take 1 tablet by mouth every 6 (six) hours as needed for severe pain. 06/17/22   Oris Krystal FALCON, MD  Zinc 50 MG TABS Take 50 mg by mouth in the morning. (0800)    [provider]     Allergies    Tramadol  hcl   Review of Systems   Review of Systems Please see HPI for pertinent positives and negatives  Physical Exam BP (!) 218/94 (BP Location: Right Arm)   Pulse 67   Temp 98.1 F (36.7 C) (Oral)   Resp 12   Ht 5' 8 (1.727 m)   Wt 82.6 kg   SpO2 100%  BMI 27.69 kg/m   Physical Exam Vitals and nursing note reviewed.  Constitutional:      Appearance: Normal appearance.  HENT:     Head: Normocephalic and atraumatic.     Nose: Nose normal.     Mouth/Throat:     Mouth: Mucous membranes are moist.  Eyes:     Extraocular Movements: Extraocular movements intact.     Conjunctiva/sclera: Conjunctivae normal.  Cardiovascular:     Rate and Rhythm: Normal rate.  Pulmonary:     Effort: Pulmonary effort is normal.     Breath sounds: Normal breath sounds. No wheezing, rhonchi or rales.  Abdominal:     General: Abdomen is flat.     Palpations: Abdomen is soft.     Tenderness: There is no abdominal tenderness.  Musculoskeletal:        General: No swelling. Normal range of motion.     Cervical back: Neck supple.     Right lower leg: No edema.      Comments: Dialysis fistula in LUE with palpable thrill  Skin:    General: Skin is warm and dry.  Neurological:     General: No focal deficit present.     Mental Status: He is alert.  Psychiatric:        Mood and Affect: Mood normal.     ED Results / Procedures / Treatments   EKG EKG Interpretation Date/Time:  Tuesday April 26 2024 02:49:36 EST Ventricular Rate:  69 PR Interval:  187 QRS Duration:  108 QT Interval:  422 QTC Calculation: 453 R Axis:   -9  Text Interpretation: Sinus rhythm Anteroseptal infarct, old Minimal ST depression, lateral leads No significant change since last tracing Confirmed by Roselyn Dunnings [phone]) on 04/26/2024 2:52:42 AM  Procedures Procedures  Medications Ordered in the ED Medications  metoprolol  tartrate (LOPRESSOR ) tablet 100 mg (has no administration in time range)  NIFEdipine  (PROCARDIA -XL/NIFEDICAL-XL) 24 hr tablet 60 mg (has no administration in time range)    Initial Impression and Plan  Patient here for nonspecific SOB, vitals and exam are reassuring. Will check labs and CXR.   ED Course   Clinical Course as of 04/26/24 0645  Tue Apr 26, 2024  0309 CBC is unremarkable.  [CS]  0310 I personally viewed the images from radiology studies and agree with radiologist interpretation: CXR is clear.  [CS]  0327 BMP is consistent with ESRD, but no acidosis or hyperkalemia.  [CS]  [id number] Covid/Flu/RSV swab is neg. BP is elevated, will give morning dose of meds, but no signs of acute end organ damage and symptoms are resolved. He is resting comfortably on room air; plan discharge back to SNF.  [CS]    Clinical Course User Index [CS] Roselyn Dunnings NOVAK, MD     MDM Rules/Calculators/A&P Medical Decision Making Given presenting complaint, I considered that admission might be necessary. After review of results from ED lab and/or imaging studies, admission to the hospital is not indicated at this time.    Problems Addressed: ESRD on  hemodialysis [facility]): chronic illness or injury Primary hypertension: chronic illness or injury SOB (shortness of breath): acute illness or injury  Amount and/or Complexity of Data Reviewed Labs: ordered. Decision-making details documented in ED Course. Radiology: ordered and independent interpretation performed. Decision-making details documented in ED Course. ECG/medicine tests: ordered and independent interpretation performed. Decision-making details documented in ED Course.  Risk Prescription drug management. Decision regarding hospitalization.     Final Clinical Impression(s) / ED Diagnoses Final diagnoses:  SOB (shortness of breath)  ESRD on hemodialysis [facility])  Primary hypertension    Rx / DC Orders ED Discharge Orders     None        Roselyn Carlin NOVAK, MD 04/26/24 [phone]  "

## 2024-04-26 NOTE — ED Triage Notes (Signed)
 Pt bib [facility] for sob since midnight. Last dialysis was yesterday.

## 2024-05-01 LAB — CULTURE, BLOOD (ROUTINE X 2)
Culture: NO GROWTH
Culture: NO GROWTH
Special Requests: ADEQUATE
Special Requests: ADEQUATE

## 2024-05-24 ENCOUNTER — Emergency Department (HOSPITAL_COMMUNITY)
Admission: EM | Admit: 2024-05-24 | Discharge: 2024-05-24 | Disposition: A | Discharge status: Home / Self Care | Attending: Emergency Medicine | Admitting: Emergency Medicine

## 2024-05-24 ENCOUNTER — Other Ambulatory Visit: Payer: Self-pay

## 2024-05-24 ENCOUNTER — Encounter (HOSPITAL_COMMUNITY): Payer: Self-pay

## 2024-05-24 DIAGNOSIS — Z992 Dependence on renal dialysis: Secondary | ICD-10-CM | POA: Insufficient documentation

## 2024-05-24 DIAGNOSIS — I120 Hypertensive chronic kidney disease with stage 5 chronic kidney disease or end stage renal disease: Secondary | ICD-10-CM | POA: Insufficient documentation

## 2024-05-24 DIAGNOSIS — R040 Epistaxis: Secondary | ICD-10-CM | POA: Diagnosis present

## 2024-05-24 DIAGNOSIS — I160 Hypertensive urgency: Secondary | ICD-10-CM

## 2024-05-24 DIAGNOSIS — Z7901 Long term (current) use of anticoagulants: Secondary | ICD-10-CM | POA: Insufficient documentation

## 2024-05-24 DIAGNOSIS — I2510 Atherosclerotic heart disease of native coronary artery without angina pectoris: Secondary | ICD-10-CM | POA: Diagnosis not present

## 2024-05-24 DIAGNOSIS — N186 End stage renal disease: Secondary | ICD-10-CM | POA: Insufficient documentation

## 2024-05-24 LAB — CBC WITH DIFFERENTIAL/PLATELET
Abs Immature Granulocytes: 0.01 K/uL (ref 0.00–0.07)
Basophils Absolute: 0.1 K/uL (ref 0.0–0.1)
Basophils Relative: 1 %
Eosinophils Absolute: 0.4 K/uL (ref 0.0–0.5)
Eosinophils Relative: 7 %
HCT: 28.1 % — ABNORMAL LOW (ref 39.0–52.0)
Hemoglobin: 8.9 g/dL — ABNORMAL LOW (ref 13.0–17.0)
Immature Granulocytes: 0 %
Lymphocytes Relative: 18 %
Lymphs Abs: 1 K/uL (ref 0.7–4.0)
MCH: 30.2 pg (ref 26.0–34.0)
MCHC: 31.7 g/dL (ref 30.0–36.0)
MCV: 95.3 fL (ref 80.0–100.0)
Monocytes Absolute: 0.5 K/uL (ref 0.1–1.0)
Monocytes Relative: 9 %
Neutro Abs: 3.5 K/uL (ref 1.7–7.7)
Neutrophils Relative %: 65 %
Platelets: 183 K/uL (ref 150–400)
RBC: 2.95 MIL/uL — ABNORMAL LOW (ref 4.22–5.81)
RDW: 14.4 % (ref 11.5–15.5)
WBC: 5.3 K/uL (ref 4.0–10.5)
nRBC: 0 % (ref 0.0–0.2)

## 2024-05-24 LAB — BASIC METABOLIC PANEL WITH GFR
Anion gap: 13 (ref 5–15)
BUN: 32 mg/dL — ABNORMAL HIGH (ref 8–23)
CO2: 27 mmol/L (ref 22–32)
Calcium: 8.9 mg/dL (ref 8.9–10.3)
Chloride: 98 mmol/L (ref 98–111)
Creatinine, Ser: 8.61 mg/dL — ABNORMAL HIGH (ref 0.61–1.24)
GFR, Estimated: 6 mL/min — ABNORMAL LOW
Glucose, Bld: 96 mg/dL (ref 70–99)
Potassium: 4.9 mmol/L (ref 3.5–5.1)
Sodium: 138 mmol/L (ref 135–145)

## 2024-05-24 MED ORDER — NIFEDIPINE ER OSMOTIC RELEASE 30 MG PO TB24
60.0000 mg | ORAL_TABLET | Freq: Every day | ORAL | Status: DC
  Administered 2024-05-24: 60 mg via ORAL
  Filled 2024-05-24: qty 2

## 2024-05-24 MED ORDER — CLONIDINE HCL 0.2 MG PO TABS
0.2000 mg | ORAL_TABLET | Freq: Once | ORAL | Status: AC
  Administered 2024-05-24: 0.2 mg via ORAL
  Filled 2024-05-24: qty 1

## 2024-05-24 MED ORDER — METOPROLOL TARTRATE 50 MG PO TABS
100.0000 mg | ORAL_TABLET | Freq: Once | ORAL | Status: AC
  Administered 2024-05-24: 100 mg via ORAL
  Filled 2024-05-24: qty 2

## 2024-05-24 MED ORDER — OXYMETAZOLINE HCL 0.05 % NA SOLN
2.0000 | Freq: Once | NASAL | Status: AC
  Administered 2024-05-24: 2 via NASAL
  Filled 2024-05-24: qty 30

## 2024-05-24 NOTE — ED Provider Notes (Signed)
 Care transferred to me.  No further bleeding.  Patient had uncontrolled high blood pressure on arrival but is now significantly improved, went from 230 systolic to 172.  He denies any headache, chest pain or other signs/symptoms of endorgan damage.  He will be given Afrin per prior provider and appears stable for discharge.  Already has an appoint with PCP, stressed discussing his blood pressure with them.  Will give follow-up instructions with ENT.   Freddi Hamilton, MD 05/24/24 [phone]

## 2024-05-24 NOTE — ED Notes (Signed)
 Limb alert applied to (L) arm

## 2024-05-24 NOTE — ED Provider Notes (Signed)
 " [place] EMERGENCY DEPARTMENT AT [facility] Provider Note   CSN: [id number] Arrival date & time: 05/24/24  9476     Patient presents with: Epistaxis and Hypertension   Matthew [name] is a 79 y.o. male.  {Add pertinent medical, surgical, social history, OB history to HPI:32947} Patient is a 79 year old male with past medical history of end-stage renal disease on hemodialysis, hypertension, peripheral artery disease, coronary artery disease status post CABG.  Patient presenting today with complaints of nosebleed.  His nose began bleeding at approximately 430 this morning in the absence of any injury or trauma.  I am told this is the third nosebleed he has experienced in the past week.  He does take Eliquis.  Bleeding seems to have resolved with direct pressure in route to the ER.  Patient goes to dialysis on MWF, and was dialyzed yesterday.       Prior to Admission medications  Medication Sig Start Date End Date Taking? Authorizing Provider  acetaminophen  (TYLENOL ) 325 MG tablet Take 2 tablets (650 mg total) by mouth every 6 (six) hours as needed for mild pain. Patient taking differently: Take 650 mg by mouth every 6 (six) hours as needed (pain.). 01/03/18   Lucien Orren SAILOR, PA-C  ascorbic acid (VITAMIN C) 500 MG tablet Take 500 mg by mouth in the morning. (0800)    [provider]  atorvastatin  (LIPITOR ) 80 MG tablet Take 1 tablet (80 mg total) by mouth daily at 6 PM. Patient taking differently: Take 80 mg by mouth at bedtime. (2000) 01/03/18   Lucien Orren SAILOR, PA-C  carbamide peroxide (DEBROX) 6.5 % OTIC solution Place 5 drops into both ears 2 (two) times daily. 02/22/24   Francesca Elsie LITTIE, MD  cholecalciferol (VITAMIN D3) 25 MCG (1000 UNIT) tablet Take 1,000 Units by mouth in the morning. (0800)    [provider]  doxycycline  (VIBRAMYCIN ) 100 MG capsule Take 1 capsule (100 mg total) by mouth 2 (two) times daily. 12/05/22   Towana Ozell BROCKS, MD  famotidine   (PEPCID ) 20 MG tablet Take 20 mg by mouth every other day as needed for heartburn (GERD).    [provider]  febuxostat  (ULORIC ) 40 MG tablet Take 40 mg by mouth in the morning. (0800)    [provider]  ferrous sulfate  324 MG TBEC Take 324 mg by mouth every Monday, Wednesday, and Friday. In the morning. (0800)    [provider]  gabapentin  (NEURONTIN ) 300 MG capsule Take 300 mg by mouth in the morning. (0800)    [provider]  methocarbamol  (ROBAXIN ) 750 MG tablet Take 750 mg by mouth at bedtime as needed for muscle spasms.    [provider]  metoprolol  tartrate (LOPRESSOR ) 100 MG tablet Take 100 mg by mouth 2 (two) times daily. (0800 & 2000)    [provider]  NIFEdipine  (ADALAT  CC) 60 MG 24 hr tablet Take 60 mg by mouth in the morning. (0800)    [provider]  Omega-3 Fatty Acids (OMEGA-3 FISH OIL PO) Take 1,000 mg by mouth in the morning and at bedtime. (0800 & 2000)    [provider]  oxyCODONE -acetaminophen  (PERCOCET) 5-325 MG tablet Take 1 tablet by mouth every 6 (six) hours as needed for severe pain. 06/17/22   Oris Krystal FALCON, MD  Zinc 50 MG TABS Take 50 mg by mouth in the morning. (0800)    [provider]    Allergies: Tramadol  hcl    Review  of Systems  All other systems reviewed and are negative.   Updated Vital Signs BP (!) 230/86   Pulse 74   Temp 99.5 F (37.5 C) (Oral)   Resp 19   Ht 5' 8 (1.727 m)   Wt 82.6 kg   SpO2 95%   BMI 27.69 kg/m   Physical Exam Vitals and nursing note reviewed.  Constitutional:      Appearance: Normal appearance.  HENT:     Head: Normocephalic and atraumatic.     Nose:     Comments: There is no active bleeding noted.  The left nares is clear.  The right is noted to have a small amount of clotted blood, but no obvious site of bleeding is noted. Pulmonary:     Effort: Pulmonary effort is normal.  Skin:    General: Skin is warm and dry.   Neurological:     Mental Status: He is alert and oriented to person, place, and time.     (all labs ordered are listed, but only abnormal results are displayed) Labs Reviewed  BASIC METABOLIC PANEL WITH GFR  CBC WITH DIFFERENTIAL/PLATELET    EKG: None  Radiology: No results found.  {Document cardiac monitor, telemetry assessment procedure when appropriate:32947} Procedures   Medications Ordered in the ED  cloNIDine  (CATAPRES ) tablet 0.2 mg (has no administration in time range)      {Click here for ABCD2, HEART and other calculators REFRESH Note before signing:1}                              Medical Decision Making Amount and/or Complexity of Data Reviewed Labs: ordered.  Risk Prescription drug management.   ***  {Document critical care time when appropriate  Document review of labs and clinical decision tools ie CHADS2VASC2, etc  Document your independent review of radiology images and any outside records  Document your discussion with family members, caretakers and with consultants  Document social determinants of health affecting pt's care  Document your decision making why or why not admission, treatments were needed:32947:::1}   Final diagnoses:  None    ED Discharge Orders     None        "

## 2024-05-24 NOTE — ED Triage Notes (Signed)
 Patient BIB [facility] from the [facility] for complaint of a nosebleed, reports this is the third one this week. Hx of Hypertension and receives dialysis M,W,F. Takes Eliquis. 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

## 2024-05-24 NOTE — Discharge Instructions (Signed)
 Talk to your doctor today about your uncontrolled high blood pressure.  You were given your morning meds this morning.  Follow-up with ENT in regards to your nosebleed.  Return to the ER for any new or worsening symptoms.

## 2024-06-06 ENCOUNTER — Observation Stay (HOSPITAL_COMMUNITY): Admission: EM | Admit: 2024-06-06 | Discharge: 2024-06-07 | Attending: Internal Medicine | Admitting: Internal Medicine

## 2024-06-06 ENCOUNTER — Encounter (HOSPITAL_COMMUNITY): Payer: Self-pay | Admitting: Emergency Medicine

## 2024-06-06 ENCOUNTER — Other Ambulatory Visit: Payer: Self-pay

## 2024-06-06 ENCOUNTER — Emergency Department (HOSPITAL_COMMUNITY)

## 2024-06-06 DIAGNOSIS — F17210 Nicotine dependence, cigarettes, uncomplicated: Secondary | ICD-10-CM | POA: Insufficient documentation

## 2024-06-06 DIAGNOSIS — K219 Gastro-esophageal reflux disease without esophagitis: Secondary | ICD-10-CM | POA: Insufficient documentation

## 2024-06-06 DIAGNOSIS — E785 Hyperlipidemia, unspecified: Secondary | ICD-10-CM | POA: Insufficient documentation

## 2024-06-06 DIAGNOSIS — D631 Anemia in chronic kidney disease: Secondary | ICD-10-CM | POA: Insufficient documentation

## 2024-06-06 DIAGNOSIS — Z79899 Other long term (current) drug therapy: Secondary | ICD-10-CM | POA: Insufficient documentation

## 2024-06-06 DIAGNOSIS — N186 End stage renal disease: Secondary | ICD-10-CM | POA: Insufficient documentation

## 2024-06-06 DIAGNOSIS — R0602 Shortness of breath: Secondary | ICD-10-CM | POA: Diagnosis not present

## 2024-06-06 DIAGNOSIS — E211 Secondary hyperparathyroidism, not elsewhere classified: Secondary | ICD-10-CM | POA: Insufficient documentation

## 2024-06-06 DIAGNOSIS — Z7901 Long term (current) use of anticoagulants: Secondary | ICD-10-CM | POA: Insufficient documentation

## 2024-06-06 DIAGNOSIS — I169 Hypertensive crisis, unspecified: Secondary | ICD-10-CM | POA: Diagnosis not present

## 2024-06-06 DIAGNOSIS — I5032 Chronic diastolic (congestive) heart failure: Secondary | ICD-10-CM | POA: Insufficient documentation

## 2024-06-06 DIAGNOSIS — E038 Other specified hypothyroidism: Secondary | ICD-10-CM | POA: Insufficient documentation

## 2024-06-06 DIAGNOSIS — I480 Paroxysmal atrial fibrillation: Secondary | ICD-10-CM | POA: Insufficient documentation

## 2024-06-06 DIAGNOSIS — I132 Hypertensive heart and chronic kidney disease with heart failure and with stage 5 chronic kidney disease, or end stage renal disease: Secondary | ICD-10-CM | POA: Insufficient documentation

## 2024-06-06 DIAGNOSIS — Z992 Dependence on renal dialysis: Secondary | ICD-10-CM | POA: Insufficient documentation

## 2024-06-06 LAB — BLOOD GAS, VENOUS
Acid-Base Excess: 3.2 mmol/L — ABNORMAL HIGH (ref 0.0–2.0)
Bicarbonate: 28.5 mmol/L — ABNORMAL HIGH (ref 20.0–28.0)
O2 Saturation: 77.5 %
Patient temperature: 36.7
pCO2, Ven: 44 mmHg (ref 44–60)
pH, Ven: 7.41 (ref 7.25–7.43)
pO2, Ven: 44 mmHg (ref 32–45)

## 2024-06-06 LAB — CBC WITH DIFFERENTIAL/PLATELET
Abs Immature Granulocytes: 0.03 10*3/uL (ref 0.00–0.07)
Basophils Absolute: 0.1 10*3/uL (ref 0.0–0.1)
Basophils Relative: 1 %
Eosinophils Absolute: 0.2 10*3/uL (ref 0.0–0.5)
Eosinophils Relative: 3 %
HCT: 25.3 % — ABNORMAL LOW (ref 39.0–52.0)
Hemoglobin: 8.2 g/dL — ABNORMAL LOW (ref 13.0–17.0)
Immature Granulocytes: 1 %
Lymphocytes Relative: 16 %
Lymphs Abs: 1 10*3/uL (ref 0.7–4.0)
MCH: 30.7 pg (ref 26.0–34.0)
MCHC: 32.4 g/dL (ref 30.0–36.0)
MCV: 94.8 fL (ref 80.0–100.0)
Monocytes Absolute: 0.6 10*3/uL (ref 0.1–1.0)
Monocytes Relative: 9 %
Neutro Abs: 4.7 10*3/uL (ref 1.7–7.7)
Neutrophils Relative %: 70 %
Platelets: 169 10*3/uL (ref 150–400)
RBC: 2.67 MIL/uL — ABNORMAL LOW (ref 4.22–5.81)
RDW: 14.2 % (ref 11.5–15.5)
WBC: 6.6 10*3/uL (ref 4.0–10.5)
nRBC: 0 % (ref 0.0–0.2)

## 2024-06-06 LAB — HEPATIC FUNCTION PANEL
ALT: 11 U/L (ref 0–44)
AST: 16 U/L (ref 15–41)
Albumin: 3.8 g/dL (ref 3.5–5.0)
Alkaline Phosphatase: 60 U/L (ref 38–126)
Bilirubin, Direct: 0.2 mg/dL (ref 0.0–0.2)
Indirect Bilirubin: 0.3 mg/dL (ref 0.3–0.9)
Total Bilirubin: 0.4 mg/dL (ref 0.0–1.2)
Total Protein: 7.1 g/dL (ref 6.5–8.1)

## 2024-06-06 LAB — RESP PANEL BY RT-PCR (RSV, FLU A&B, COVID)  RVPGX2
Influenza A by PCR: NEGATIVE
Influenza B by PCR: NEGATIVE
Resp Syncytial Virus by PCR: NEGATIVE
SARS Coronavirus 2 by RT PCR: NEGATIVE

## 2024-06-06 LAB — BASIC METABOLIC PANEL WITH GFR
Anion gap: 15 (ref 5–15)
BUN: 43 mg/dL — ABNORMAL HIGH (ref 8–23)
CO2: 25 mmol/L (ref 22–32)
Calcium: 9.3 mg/dL (ref 8.9–10.3)
Chloride: 100 mmol/L (ref 98–111)
Creatinine, Ser: 11 mg/dL — ABNORMAL HIGH (ref 0.61–1.24)
GFR, Estimated: 4 mL/min — ABNORMAL LOW
Glucose, Bld: 92 mg/dL (ref 70–99)
Potassium: 5 mmol/L (ref 3.5–5.1)
Sodium: 140 mmol/L (ref 135–145)

## 2024-06-06 LAB — HEPATITIS B SURFACE ANTIGEN: Hepatitis B Surface Ag: NONREACTIVE

## 2024-06-06 LAB — TROPONIN T, HIGH SENSITIVITY
Troponin T High Sensitivity: 122 ng/L (ref 0–19)
Troponin T High Sensitivity: 125 ng/L (ref 0–19)

## 2024-06-06 LAB — MRSA NEXT GEN BY PCR, NASAL: MRSA by PCR Next Gen: NOT DETECTED

## 2024-06-06 LAB — PRO BRAIN NATRIURETIC PEPTIDE: Pro Brain Natriuretic Peptide: >35000 pg/mL — ABNORMAL HIGH

## 2024-06-06 MED ORDER — NIFEDIPINE ER OSMOTIC RELEASE 30 MG PO TB24
60.0000 mg | ORAL_TABLET | Freq: Every morning | ORAL | Status: DC
  Administered 2024-06-06 – 2024-06-07 (×2): 60 mg via ORAL
  Filled 2024-06-06 (×2): qty 2

## 2024-06-06 MED ORDER — HEPARIN SODIUM (PORCINE) 1000 UNIT/ML DIALYSIS: 1000.0000 [IU] | INTRAMUSCULAR | Status: DC | PRN

## 2024-06-06 MED ORDER — POLYETHYLENE GLYCOL 3350 17 G PO PACK: 17.0000 g | PACK | Freq: Every day | ORAL | Status: DC | PRN

## 2024-06-06 MED ORDER — HEPARIN SODIUM (PORCINE) 5000 UNIT/ML IJ SOLN: 5000.0000 [IU] | Freq: Three times a day (TID) | INTRAMUSCULAR | Status: DC

## 2024-06-06 MED ORDER — ATORVASTATIN CALCIUM 40 MG PO TABS
80.0000 mg | ORAL_TABLET | Freq: Every day | ORAL | Status: DC
  Administered 2024-06-06: 80 mg via ORAL
  Filled 2024-06-06: qty 2

## 2024-06-06 MED ORDER — APIXABAN 2.5 MG PO TABS
2.5000 mg | ORAL_TABLET | Freq: Two times a day (BID) | ORAL | Status: DC
  Administered 2024-06-06 – 2024-06-07 (×3): 2.5 mg via ORAL
  Filled 2024-06-06 (×3): qty 1

## 2024-06-06 MED ORDER — ANTICOAGULANT SODIUM CITRATE 4% (200MG/5ML) IV SOLN: 5.0000 mL | Status: DC | PRN

## 2024-06-06 MED ORDER — MELATONIN 3 MG PO TABS
6.0000 mg | ORAL_TABLET | Freq: Every evening | ORAL | Status: DC | PRN
  Administered 2024-06-06: 6 mg via ORAL
  Filled 2024-06-06: qty 2

## 2024-06-06 MED ORDER — OXYCODONE-ACETAMINOPHEN 5-325 MG PO TABS: 1.0000 | ORAL_TABLET | Freq: Four times a day (QID) | ORAL | Status: DC | PRN

## 2024-06-06 MED ORDER — PROCHLORPERAZINE EDISYLATE 10 MG/2ML IJ SOLN: 5.0000 mg | Freq: Four times a day (QID) | INTRAMUSCULAR | Status: DC | PRN

## 2024-06-06 MED ORDER — VITAMIN D 25 MCG (1000 UNIT) PO TABS
1000.0000 [IU] | ORAL_TABLET | Freq: Every morning | ORAL | Status: DC
  Administered 2024-06-07: 1000 [IU] via ORAL
  Filled 2024-06-06: qty 1

## 2024-06-06 MED ORDER — GABAPENTIN 300 MG PO CAPS
300.0000 mg | ORAL_CAPSULE | Freq: Every morning | ORAL | Status: DC
  Administered 2024-06-07: 300 mg via ORAL
  Filled 2024-06-06: qty 1

## 2024-06-06 MED ORDER — ALTEPLASE 2 MG IJ SOLR: 2.0000 mg | Freq: Once | INTRAMUSCULAR | Status: DC | PRN

## 2024-06-06 MED ORDER — ACETAMINOPHEN 325 MG PO TABS: 650.0000 mg | ORAL_TABLET | Freq: Four times a day (QID) | ORAL | Status: DC | PRN

## 2024-06-06 MED ORDER — CHLORHEXIDINE GLUCONATE CLOTH 2 % EX PADS
6.0000 | MEDICATED_PAD | Freq: Every day | CUTANEOUS | Status: DC
  Administered 2024-06-06 – 2024-06-07 (×2): 6 via TOPICAL

## 2024-06-06 MED ORDER — PENTAFLUOROPROP-TETRAFLUOROETH EX AERO: 1.0000 | INHALATION_SPRAY | CUTANEOUS | Status: DC | PRN

## 2024-06-06 MED ORDER — METHOCARBAMOL 500 MG PO TABS: 500.0000 mg | ORAL_TABLET | Freq: Three times a day (TID) | ORAL | Status: DC | PRN

## 2024-06-06 MED ORDER — METOPROLOL TARTRATE 50 MG PO TABS: 100.0000 mg | ORAL_TABLET | Freq: Two times a day (BID) | ORAL | Status: DC

## 2024-06-06 MED ORDER — NITROGLYCERIN IN D5W 200-5 MCG/ML-% IV SOLN
5.0000 ug/min | INTRAVENOUS | Status: DC
  Administered 2024-06-06: 50 ug/min via INTRAVENOUS
  Administered 2024-06-06: 200 ug/min via INTRAVENOUS
  Filled 2024-06-06: qty 250

## 2024-06-06 MED ORDER — METOPROLOL TARTRATE 50 MG PO TABS
100.0000 mg | ORAL_TABLET | Freq: Two times a day (BID) | ORAL | Status: DC
  Administered 2024-06-06 – 2024-06-07 (×3): 100 mg via ORAL
  Filled 2024-06-06 (×3): qty 2

## 2024-06-06 MED ORDER — ORAL CARE MOUTH RINSE: 15.0000 mL | OROMUCOSAL | Status: DC | PRN

## 2024-06-06 MED ORDER — NIFEDIPINE ER OSMOTIC RELEASE 30 MG PO TB24: 60.0000 mg | ORAL_TABLET | Freq: Every morning | ORAL | Status: DC

## 2024-06-06 MED ORDER — LIDOCAINE-PRILOCAINE 2.5-2.5 % EX CREA: 1.0000 | TOPICAL_CREAM | CUTANEOUS | Status: DC | PRN

## 2024-06-06 MED ORDER — PANTOPRAZOLE SODIUM 40 MG PO TBEC
40.0000 mg | DELAYED_RELEASE_TABLET | Freq: Every day | ORAL | Status: DC
  Administered 2024-06-06 – 2024-06-07 (×2): 40 mg via ORAL
  Filled 2024-06-06 (×2): qty 1

## 2024-06-06 MED ORDER — LIDOCAINE HCL (PF) 1 % IJ SOLN: 5.0000 mL | INTRAMUSCULAR | Status: DC | PRN

## 2024-06-06 MED ORDER — CALCIUM ACETATE (PHOS BINDER) 667 MG PO CAPS
1334.0000 mg | ORAL_CAPSULE | Freq: Three times a day (TID) | ORAL | Status: DC
  Administered 2024-06-06 – 2024-06-07 (×3): 1334 mg via ORAL
  Filled 2024-06-06 (×3): qty 2

## 2024-06-06 NOTE — Plan of Care (Signed)

## 2024-06-06 NOTE — Progress Notes (Signed)
 Nephrology note: 79 year old male with history of hypertension, dyslipidemia, BPH, CAD, CHF, ESRD on HD MWF admitted with hypertensive emergency and CHF.  Blood pressure is 210/89, temperature 98, and room air.  Labs with potassium 5.0, sodium 140, hemoglobin 8.2.  The patient is currently on nitroglycerin  drip. Plan for dialysis today with UF goal of 4 L.  Discussed with nursing staff to prioritize him for HD.  Hopefully blood pressure will improve with ultrafiltration. Discussed with HD nurse and primary team. Matthew [name], [facility].

## 2024-06-06 NOTE — Progress Notes (Signed)
 Pt receives out-pt HD at [facility], MWF, 1030 am chair time. Will continue to assist as needed.    Lavanda Caliann Leckrone  Dialysis Navigator [phone]

## 2024-06-06 NOTE — ED Provider Notes (Signed)
 "  EMERGENCY DEPARTMENT AT [facility] Provider Note   CSN: [id number] Arrival date & time: 06/06/24  9584     Patient presents with: Shortness of Breath   Matthew [name] is a 79 y.o. male.   HPI Patient presents for shortness of breath.  Medical history includes CAD, BPH, ESRD, HTN, HLD, neuropathy, prediabetes, CHF.  He undergoes HD on M, W, F.  He states that he did undergo a full session on Friday.  On Saturday EMS was called to his facility for shortness of breath.  He declined transport at the time.  Today, they were called again.  Patient describes a tightness across his chest and a feeling of inability to get a full breath.  He denies any pain.  His vital signs with EMS were notable for hypertension.    Prior to Admission medications  Medication Sig Start Date End Date Taking? Authorizing Provider  acetaminophen  (TYLENOL ) 325 MG tablet Take 2 tablets (650 mg total) by mouth every 6 (six) hours as needed for mild pain. Patient taking differently: Take 650 mg by mouth every 6 (six) hours as needed (pain.). 01/03/18   Lucien Orren SAILOR, PA-C  ascorbic acid (VITAMIN C) 500 MG tablet Take 500 mg by mouth in the morning. (0800)    [provider]  atorvastatin  (LIPITOR ) 80 MG tablet Take 1 tablet (80 mg total) by mouth daily at 6 PM. Patient taking differently: Take 80 mg by mouth at bedtime. (2000) 01/03/18   Lucien Orren SAILOR, PA-C  carbamide peroxide (DEBROX) 6.5 % OTIC solution Place 5 drops into both ears 2 (two) times daily. 02/22/24   Francesca Elsie LITTIE, MD  cholecalciferol  (VITAMIN D3) 25 MCG (1000 UNIT) tablet Take 1,000 Units by mouth in the morning. (0800)    [provider]  doxycycline  (VIBRAMYCIN ) 100 MG capsule Take 1 capsule (100 mg total) by mouth 2 (two) times daily. 12/05/22   Towana Ozell BROCKS, MD  famotidine  (PEPCID ) 20 MG tablet Take 20 mg by mouth every other day as needed for heartburn (GERD).    [provider]  febuxostat   (ULORIC ) 40 MG tablet Take 40 mg by mouth in the morning. (0800)    [provider]  ferrous sulfate  324 MG TBEC Take 324 mg by mouth every Monday, Wednesday, and Friday. In the morning. (0800)    [provider]  gabapentin  (NEURONTIN ) 300 MG capsule Take 300 mg by mouth in the morning. (0800)    [provider]  methocarbamol  (ROBAXIN ) 750 MG tablet Take 750 mg by mouth at bedtime as needed for muscle spasms.    [provider]  metoprolol  tartrate (LOPRESSOR ) 100 MG tablet Take 100 mg by mouth 2 (two) times daily. (0800 & 2000)    [provider]  NIFEdipine  (ADALAT  CC) 60 MG 24 hr tablet Take 60 mg by mouth in the morning. (0800)    [provider]  Omega-3 Fatty Acids (OMEGA-3 FISH OIL PO) Take 1,000 mg by mouth in the morning and at bedtime. (0800 & 2000)    [provider]  oxyCODONE -acetaminophen  (PERCOCET) 5-325 MG tablet Take 1 tablet by mouth every 6 (six) hours as needed for severe pain. 06/17/22   Oris Krystal FALCON, MD  Zinc 50 MG TABS Take 50 mg by mouth in the morning. (0800)    [provider]    Allergies: Tramadol  hcl    Review of Systems  Respiratory:  Positive for chest tightness and shortness  of breath.   All other systems reviewed and are negative.   Updated Vital Signs BP (!) 214/92   Pulse (!) 59   Temp 98.1 F (36.7 C) (Oral)   Resp 13   Ht 5' 8 (1.727 m)   Wt 83 kg   SpO2 96%   BMI 27.82 kg/m   Physical Exam Vitals and nursing note reviewed.  Constitutional:      General: He is not in acute distress.    Appearance: He is well-developed. He is not ill-appearing, toxic-appearing or diaphoretic.  HENT:     Head: Normocephalic and atraumatic.     Mouth/Throat:     Mouth: Mucous membranes are moist.  Eyes:     Conjunctiva/sclera: Conjunctivae normal.  Cardiovascular:     Rate and Rhythm: Normal rate and regular rhythm.     Heart sounds: No murmur heard. Pulmonary:     Effort:  Pulmonary effort is normal. No tachypnea, accessory muscle usage or respiratory distress.     Breath sounds: Decreased breath sounds present. No wheezing, rhonchi or rales.  Chest:     Chest wall: No tenderness.  Abdominal:     Palpations: Abdomen is soft.     Tenderness: There is no abdominal tenderness.  Musculoskeletal:        General: No swelling. Normal range of motion.     Cervical back: Normal range of motion.     Right lower leg: Edema present.     Comments: Left BKA present  Skin:    General: Skin is warm and dry.     Coloration: Skin is not cyanotic or pale.  Neurological:     General: No focal deficit present.     Mental Status: He is alert and oriented to person, place, and time.  Psychiatric:        Mood and Affect: Mood normal.        Behavior: Behavior normal.     (all labs ordered are listed, but only abnormal results are displayed) Labs Reviewed  BASIC METABOLIC PANEL WITH GFR - Abnormal; Notable for the following components:      Result Value   BUN 43 (*)    Creatinine, Ser 11.00 (*)    GFR, Estimated 4 (*)    All other components within normal limits  PRO BRAIN NATRIURETIC PEPTIDE - Abnormal; Notable for the following components:   Pro Brain Natriuretic Peptide >35,000.0 (*)    All other components within normal limits  BLOOD GAS, VENOUS - Abnormal; Notable for the following components:   Bicarbonate 28.5 (*)    Acid-Base Excess 3.2 (*)    All other components within normal limits  CBC WITH DIFFERENTIAL/PLATELET - Abnormal; Notable for the following components:   RBC 2.67 (*)    Hemoglobin 8.2 (*)    HCT 25.3 (*)    All other components within normal limits  TROPONIN T, HIGH SENSITIVITY - Abnormal; Notable for the following components:   Troponin T High Sensitivity 122 (*)    All other components within normal limits  RESP PANEL BY RT-PCR (RSV, FLU A&B, COVID)  [id number]  HEPATIC FUNCTION PANEL  TROPONIN T, HIGH SENSITIVITY    EKG: EKG  Interpretation Date/Time:  Monday June 06 2024 04:29:33 EST Ventricular Rate:  70 PR Interval:  177 QRS Duration:  113 QT Interval:  477 QTC Calculation: 515 R Axis:   5  Text Interpretation: Sinus rhythm LVH with IVCD and secondary repol abnrm Prolonged QT interval Confirmed by Melvenia Motto (  [id number]) on 06/06/2024 5:43:52 AM  Radiology: [facility] Chest Port 1 View Result Date: 06/06/2024 EXAM: 1 VIEW(S) XRAY OF THE CHEST 06/06/2024 05:25:37 AM COMPARISON: Portable chest 04/26/2024. CLINICAL HISTORY: Shortness of breath. FINDINGS: LUNGS AND PLEURA: No focal pulmonary opacity. No pleural effusion. No pneumothorax. 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 There is mild cardiomegaly. No evidence for CHF. Stable mediastinum with aortic atherosclerosis and mild tortuosity. Multiple overlying telemetry leads. BONES AND SOFT TISSUES: Old sternotomy changes. Thoracic spondylosis. IMPRESSION: 1. No acute findings. 2. Mild cardiomegaly. No evidence for CHF. Electronically signed by: Francis Quam MD 06/06/2024 05:38 AM EST RP Workstation: [id number]     Procedures   Medications Ordered in the ED  nitroGLYCERIN  50 mg in dextrose  5 % 250 mL (0.2 mg/mL) infusion (125 mcg/min Intravenous Infusion Verify 06/06/24 0607)  NIFEdipine  (PROCARDIA -XL/NIFEDICAL-XL) 24 hr tablet 60 mg (60 mg Oral Given 06/06/24 0515)  metoprolol  tartrate (LOPRESSOR ) tablet 100 mg (100 mg Oral Given 06/06/24 0516)                                    Medical Decision Making Amount and/or Complexity of Data Reviewed Labs: ordered. Radiology: ordered.  Risk Prescription drug management.   This patient presents to the ED for concern of shortness of breath, this involves an extensive number of treatment options, and is a complaint that carries with it a high risk of complications and morbidity.  The differential diagnosis includes ACS, pulmonary edema, hypertensive crisis, CHF exacerbation, anemia, acidosis   Co  morbidities / Chronic conditions that complicate the patient evaluation  CAD, BPH, ESRD, HTN, HLD, neuropathy, prediabetes, CHF   Additional history obtained:  Additional history obtained from EMR External records from outside source obtained and reviewed including EMS   Lab Tests:  I Ordered, and personally interpreted labs.  The pertinent results include: Slightly downtrending hemoglobin over the past several months; no leukocytosis; elevated creatinine consistent with ESRD; normal electrolytes; elevated troponin and BNP consistent with CHF exacerbation and/or hypertensive crisis   Imaging Studies ordered:  I ordered imaging studies including chest x-ray I independently visualized and interpreted imaging which showed mild cardiomegaly I agree with the radiologist interpretation   Cardiac Monitoring: / EKG:  The patient was maintained on a cardiac monitor.  I personally viewed and interpreted the cardiac monitored which showed an underlying rhythm of: Sinus rhythm   Problem List / ED Course / Critical interventions / Medication management  Patient presenting for chest tightness and shortness of breath.  On arrival in the ED, he is well-appearing.  His current breathing is unlabored.  He is able to speak in complete sentences.  On lung auscultation, no wheezing or rhonchi is present.  He does have some diminished breath sounds.  Patient was placed on monitor.  Workup was initiated.  When patient was placed on monitor, blood pressure was markedly elevated.  Home blood pressure medications were ordered.  Patient was started on nitroglycerin  gtt.  His lab work is notable for elevations in troponin and BNP.  This is consistent with hypertensive crisis.  Patient did have improved symptoms with lowering of blood pressure.  Currently, NTG at 100 mcg/min.  I spoke with nephrologist on-call, Dr. Jerrye, who will arrange for dialysis today.  Patient to be admitted for further management. I  ordered medication including nifedipine , metoprolol , nitroglycerin  for HTN Reevaluation of the patient after these medicines showed that  the patient improved I have reviewed the patients home medicines and have made adjustments as needed   Consultations Obtained:  I requested consultation with the nephrologist, Dr. Jerrye,  and discussed lab and imaging findings as well as pertinent plan - they recommend: Will arrange for dialysis today   Social Determinants of Health:  Resides in nursing facility  CRITICAL CARE Performed by: Bernardino Fireman   Total critical care time: 33 minutes  Critical care time was exclusive of separately billable procedures and treating other patients.  Critical care was necessary to treat or prevent imminent or life-threatening deterioration.  Critical care was time spent personally by me on the following activities: development of treatment plan with patient and/or surrogate as well as nursing, discussions with consultants, evaluation of patient's response to treatment, examination of patient, obtaining history from patient or surrogate, ordering and performing treatments and interventions, ordering and review of laboratory studies, ordering and review of radiographic studies, pulse oximetry and re-evaluation of patient's condition.     Final diagnoses:  Hypertensive crisis  SOB (shortness of breath)    ED Discharge Orders     None          Fireman Bernardino, MD 06/06/24 [phone]  "

## 2024-06-06 NOTE — Progress Notes (Signed)
 The patient goal not met d/t cramping and cut of 30 mis. Dr. Dolan notified.  06/06/24 1322  Vitals  Temp 97.6 F (36.4 C)  Temp Source Oral  BP 120/60  BP Location Right Arm  BP Method Automatic  Patient Position (if appropriate) Lying  Oxygen Therapy  O2 Device Room Air  During Treatment Monitoring  Intra-Hemodialysis Comments See progress note  Post Treatment  Dialyzer Clearance Lightly streaked  Hemodialysis Intake (mL) 200 mL  Liters Processed 60  Fluid Removed (mL) 2700 mL  Tolerated HD Treatment Yes  Post-Hemodialysis Comments see notes.  AVG/AVF Arterial Site Held (minutes) 10 minutes  AVG/AVF Venous Site Held (minutes) 10 minutes

## 2024-06-07 DIAGNOSIS — R0602 Shortness of breath: Secondary | ICD-10-CM

## 2024-06-07 LAB — CBC
HCT: 22.9 % — ABNORMAL LOW (ref 39.0–52.0)
Hemoglobin: 7.5 g/dL — ABNORMAL LOW (ref 13.0–17.0)
MCH: 30.9 pg (ref 26.0–34.0)
MCHC: 32.8 g/dL (ref 30.0–36.0)
MCV: 94.2 fL (ref 80.0–100.0)
Platelets: 157 10*3/uL (ref 150–400)
RBC: 2.43 MIL/uL — ABNORMAL LOW (ref 4.22–5.81)
RDW: 13.8 % (ref 11.5–15.5)
WBC: 5.4 10*3/uL (ref 4.0–10.5)
nRBC: 0 % (ref 0.0–0.2)

## 2024-06-07 LAB — BASIC METABOLIC PANEL WITH GFR
Anion gap: 16 — ABNORMAL HIGH (ref 5–15)
BUN: 28 mg/dL — ABNORMAL HIGH (ref 8–23)
CO2: 26 mmol/L (ref 22–32)
Calcium: 8.6 mg/dL — ABNORMAL LOW (ref 8.9–10.3)
Chloride: 98 mmol/L (ref 98–111)
Creatinine, Ser: 8.73 mg/dL — ABNORMAL HIGH (ref 0.61–1.24)
GFR, Estimated: 6 mL/min — ABNORMAL LOW
Glucose, Bld: 92 mg/dL (ref 70–99)
Potassium: 4.2 mmol/L (ref 3.5–5.1)
Sodium: 139 mmol/L (ref 135–145)

## 2024-06-07 MED ORDER — PANTOPRAZOLE SODIUM 40 MG PO TBEC: 40.0000 mg | DELAYED_RELEASE_TABLET | Freq: Every day | ORAL | 1 refills | Status: AC

## 2024-06-07 MED ORDER — NIFEDIPINE ER OSMOTIC RELEASE 90 MG PO TB24: 90.0000 mg | ORAL_TABLET | Freq: Every morning | ORAL | 3 refills | Status: AC

## 2024-06-07 MED ORDER — NIFEDIPINE ER OSMOTIC RELEASE 30 MG PO TB24: 90.0000 mg | ORAL_TABLET | Freq: Every morning | ORAL | Status: DC

## 2024-06-07 NOTE — Care Management CC44 (Signed)
"         Condition Code 44 Documentation Completed  Patient Details  Name: ZUBAYR BEDNARCZYK MRN: [id number] Date of Birth: 05-Nov-1945   Condition Code 44 given:  Yes Patient signature on Condition Code 44 notice:  Yes Documentation of 2 MD's agreement:  Yes Code 44 added to claim:  Yes    Mcarthur Saddie Kim, LCSW 06/07/2024, 11:49 AM  "

## 2024-06-07 NOTE — Care Management Obs Status (Signed)
 MEDICARE OBSERVATION STATUS NOTIFICATION   Patient Details  Name: Matthew [name] MRN: [id number] Date of Birth: [birth year]   Medicare Observation Status Notification Given:  Yes    Mcarthur Saddie Kim, LCSW 06/07/2024, 11:48 AM

## 2024-06-07 NOTE — Plan of Care (Signed)

## 2024-06-09 LAB — HEPATITIS B SURFACE ANTIBODY, QUANTITATIVE: Hep B S AB Quant (Post): 15524 m[IU]/mL
